# Patient Record
Sex: Female | Born: 1940 | Race: White | Hispanic: No | Marital: Married | Smoking: Never smoker
Health system: Southern US, Community
[De-identification: ages and names within clinical notes are randomized; demographics above are authoritative.]

## PROBLEM LIST (undated history)

## (undated) DIAGNOSIS — K219 Gastro-esophageal reflux disease without esophagitis: Secondary | ICD-10-CM

## (undated) DIAGNOSIS — D510 Vitamin B12 deficiency anemia due to intrinsic factor deficiency: Secondary | ICD-10-CM

## (undated) DIAGNOSIS — J449 Chronic obstructive pulmonary disease, unspecified: Secondary | ICD-10-CM

## (undated) DIAGNOSIS — J471 Bronchiectasis with (acute) exacerbation: Secondary | ICD-10-CM

## (undated) DIAGNOSIS — R627 Adult failure to thrive: Secondary | ICD-10-CM

## (undated) DIAGNOSIS — A310 Pulmonary mycobacterial infection: Secondary | ICD-10-CM

## (undated) DIAGNOSIS — J45909 Unspecified asthma, uncomplicated: Secondary | ICD-10-CM

## (undated) DIAGNOSIS — I10 Essential (primary) hypertension: Secondary | ICD-10-CM

## (undated) HISTORY — DX: Gastro-esophageal reflux disease without esophagitis: K21.9

## (undated) HISTORY — DX: Hypercalcemia: E83.52

## (undated) HISTORY — DX: Adult failure to thrive: R62.7

## (undated) HISTORY — PX: TONSILLECTOMY: SUR1361

## (undated) HISTORY — PX: LUNG BIOPSY: SHX232

## (undated) HISTORY — DX: Vitamin B12 deficiency anemia due to intrinsic factor deficiency: D51.0

## (undated) HISTORY — DX: Bronchiectasis with (acute) exacerbation: J47.1

## (undated) HISTORY — DX: Chronic obstructive pulmonary disease, unspecified: J44.9

## (undated) HISTORY — DX: Pulmonary mycobacterial infection: A31.0

---

## 1998-06-14 ENCOUNTER — Encounter: Payer: Self-pay | Admitting: Internal Medicine

## 1998-06-14 ENCOUNTER — Ambulatory Visit (HOSPITAL_COMMUNITY): Admission: RE | Admit: 1998-06-14 | Discharge: 1998-06-14 | Payer: Self-pay | Admitting: Internal Medicine

## 1998-06-25 ENCOUNTER — Ambulatory Visit (HOSPITAL_COMMUNITY): Admission: RE | Admit: 1998-06-25 | Discharge: 1998-06-25 | Payer: Self-pay | Admitting: Internal Medicine

## 2001-10-31 ENCOUNTER — Inpatient Hospital Stay (HOSPITAL_COMMUNITY): Admission: AD | Admit: 2001-10-31 | Discharge: 2001-11-04 | Payer: Self-pay | Admitting: Internal Medicine

## 2001-11-01 ENCOUNTER — Encounter: Payer: Self-pay | Admitting: Internal Medicine

## 2002-01-21 ENCOUNTER — Encounter: Payer: Self-pay | Admitting: Internal Medicine

## 2002-01-21 ENCOUNTER — Encounter (HOSPITAL_COMMUNITY): Admission: RE | Admit: 2002-01-21 | Discharge: 2002-04-21 | Payer: Self-pay | Admitting: Internal Medicine

## 2002-01-22 ENCOUNTER — Inpatient Hospital Stay (HOSPITAL_COMMUNITY): Admission: EM | Admit: 2002-01-22 | Discharge: 2002-01-24 | Payer: Self-pay | Admitting: Internal Medicine

## 2003-08-22 ENCOUNTER — Inpatient Hospital Stay (HOSPITAL_COMMUNITY): Admission: EM | Admit: 2003-08-22 | Discharge: 2003-08-27 | Payer: Self-pay | Admitting: Emergency Medicine

## 2004-03-05 ENCOUNTER — Inpatient Hospital Stay (HOSPITAL_COMMUNITY): Admission: EM | Admit: 2004-03-05 | Discharge: 2004-03-09 | Payer: Self-pay

## 2004-08-26 ENCOUNTER — Ambulatory Visit: Payer: Self-pay | Admitting: Internal Medicine

## 2004-12-05 ENCOUNTER — Ambulatory Visit: Payer: Self-pay | Admitting: Internal Medicine

## 2005-03-07 ENCOUNTER — Ambulatory Visit: Payer: Self-pay | Admitting: Internal Medicine

## 2005-05-29 ENCOUNTER — Ambulatory Visit: Payer: Self-pay | Admitting: Internal Medicine

## 2005-06-06 ENCOUNTER — Ambulatory Visit: Payer: Self-pay | Admitting: Internal Medicine

## 2005-09-05 ENCOUNTER — Ambulatory Visit: Payer: Self-pay | Admitting: Internal Medicine

## 2005-12-11 ENCOUNTER — Ambulatory Visit: Payer: Self-pay | Admitting: Internal Medicine

## 2006-01-11 ENCOUNTER — Ambulatory Visit: Payer: Self-pay | Admitting: Internal Medicine

## 2006-03-07 ENCOUNTER — Ambulatory Visit: Payer: Self-pay | Admitting: Infectious Diseases

## 2006-03-08 ENCOUNTER — Ambulatory Visit: Payer: Self-pay | Admitting: Internal Medicine

## 2006-05-09 ENCOUNTER — Ambulatory Visit: Payer: Self-pay | Admitting: Infectious Diseases

## 2006-05-30 ENCOUNTER — Ambulatory Visit: Payer: Self-pay | Admitting: Internal Medicine

## 2006-08-08 ENCOUNTER — Ambulatory Visit: Payer: Self-pay | Admitting: Infectious Diseases

## 2006-08-30 ENCOUNTER — Ambulatory Visit: Payer: Self-pay | Admitting: Internal Medicine

## 2006-09-01 DIAGNOSIS — J189 Pneumonia, unspecified organism: Secondary | ICD-10-CM

## 2006-09-01 DIAGNOSIS — K219 Gastro-esophageal reflux disease without esophagitis: Secondary | ICD-10-CM

## 2006-09-01 DIAGNOSIS — J471 Bronchiectasis with (acute) exacerbation: Secondary | ICD-10-CM | POA: Insufficient documentation

## 2006-09-13 ENCOUNTER — Encounter: Payer: Self-pay | Admitting: Infectious Diseases

## 2006-10-09 DIAGNOSIS — J479 Bronchiectasis, uncomplicated: Secondary | ICD-10-CM

## 2006-12-28 ENCOUNTER — Encounter: Payer: Self-pay | Admitting: Infectious Diseases

## 2006-12-28 ENCOUNTER — Ambulatory Visit: Payer: Self-pay | Admitting: Internal Medicine

## 2007-01-02 ENCOUNTER — Ambulatory Visit: Payer: Self-pay | Admitting: Infectious Diseases

## 2007-01-02 DIAGNOSIS — A310 Pulmonary mycobacterial infection: Secondary | ICD-10-CM | POA: Insufficient documentation

## 2007-01-11 ENCOUNTER — Telehealth: Payer: Self-pay | Admitting: Infectious Diseases

## 2007-03-26 ENCOUNTER — Telehealth: Payer: Self-pay | Admitting: Infectious Diseases

## 2007-06-27 ENCOUNTER — Ambulatory Visit: Payer: Self-pay | Admitting: Internal Medicine

## 2007-06-27 ENCOUNTER — Encounter: Payer: Self-pay | Admitting: Infectious Diseases

## 2007-07-02 ENCOUNTER — Encounter: Payer: Self-pay | Admitting: Internal Medicine

## 2007-07-06 ENCOUNTER — Encounter: Payer: Self-pay | Admitting: Internal Medicine

## 2007-07-22 ENCOUNTER — Ambulatory Visit: Payer: Self-pay | Admitting: Infectious Diseases

## 2007-10-14 ENCOUNTER — Ambulatory Visit: Payer: Self-pay | Admitting: Internal Medicine

## 2007-11-27 ENCOUNTER — Ambulatory Visit: Payer: Self-pay | Admitting: Infectious Diseases

## 2007-12-25 ENCOUNTER — Ambulatory Visit: Payer: Self-pay | Admitting: Internal Medicine

## 2008-03-25 ENCOUNTER — Ambulatory Visit: Payer: Self-pay | Admitting: Infectious Diseases

## 2008-06-29 ENCOUNTER — Ambulatory Visit: Payer: Self-pay | Admitting: Internal Medicine

## 2008-07-02 ENCOUNTER — Encounter: Payer: Self-pay | Admitting: Internal Medicine

## 2008-12-28 ENCOUNTER — Ambulatory Visit: Payer: Self-pay | Admitting: Internal Medicine

## 2008-12-29 ENCOUNTER — Encounter: Payer: Self-pay | Admitting: Internal Medicine

## 2009-01-12 ENCOUNTER — Encounter: Payer: Self-pay | Admitting: Internal Medicine

## 2009-03-08 ENCOUNTER — Ambulatory Visit: Payer: Self-pay | Admitting: Internal Medicine

## 2009-06-14 ENCOUNTER — Ambulatory Visit: Payer: Self-pay | Admitting: Internal Medicine

## 2009-09-08 ENCOUNTER — Telehealth: Payer: Self-pay | Admitting: Pulmonary Disease

## 2009-09-09 ENCOUNTER — Telehealth (INDEPENDENT_AMBULATORY_CARE_PROVIDER_SITE_OTHER): Payer: Self-pay | Admitting: *Deleted

## 2009-11-18 ENCOUNTER — Telehealth: Payer: Self-pay | Admitting: Internal Medicine

## 2009-12-13 ENCOUNTER — Ambulatory Visit: Payer: Self-pay | Admitting: Internal Medicine

## 2009-12-21 ENCOUNTER — Encounter: Payer: Self-pay | Admitting: Internal Medicine

## 2009-12-27 ENCOUNTER — Telehealth: Payer: Self-pay | Admitting: Internal Medicine

## 2010-01-14 ENCOUNTER — Ambulatory Visit: Payer: Self-pay | Admitting: Internal Medicine

## 2010-01-25 ENCOUNTER — Encounter: Payer: Self-pay | Admitting: Internal Medicine

## 2010-02-01 ENCOUNTER — Telehealth: Payer: Self-pay | Admitting: Internal Medicine

## 2010-02-22 ENCOUNTER — Telehealth (INDEPENDENT_AMBULATORY_CARE_PROVIDER_SITE_OTHER): Payer: Self-pay | Admitting: *Deleted

## 2010-02-22 ENCOUNTER — Encounter: Payer: Self-pay | Admitting: Internal Medicine

## 2010-02-22 ENCOUNTER — Ambulatory Visit: Payer: Self-pay | Admitting: Internal Medicine

## 2010-02-24 LAB — CONVERTED CEMR LAB
BUN: 13 mg/dL (ref 6–23)
Basophils Absolute: 0 10*3/uL (ref 0.0–0.1)
CO2: 31 meq/L (ref 19–32)
Creatinine, Ser: 0.6 mg/dL (ref 0.4–1.2)
GFR calc non Af Amer: 102.77 mL/min (ref >60)
HCT: 38.2 % (ref 36.0–46.0)
Neutro Abs: 4.1 10*3/uL (ref 1.4–7.7)
Prothrombin Time: 11.2 s (ref 9.7–11.8)
RBC: 4.74 M/uL (ref 3.87–5.11)
Sodium: 139 meq/L (ref 135–145)
WBC: 7.3 10*3/uL (ref 4.5–10.5)

## 2010-03-01 ENCOUNTER — Ambulatory Visit: Payer: Self-pay | Admitting: Cardiology

## 2010-03-02 ENCOUNTER — Telehealth (INDEPENDENT_AMBULATORY_CARE_PROVIDER_SITE_OTHER): Payer: Self-pay | Admitting: *Deleted

## 2010-03-29 ENCOUNTER — Ambulatory Visit: Payer: Self-pay | Admitting: Internal Medicine

## 2010-06-13 ENCOUNTER — Ambulatory Visit: Payer: Self-pay | Admitting: Internal Medicine

## 2010-06-15 ENCOUNTER — Encounter: Payer: Self-pay | Admitting: Internal Medicine

## 2010-06-21 ENCOUNTER — Telehealth: Payer: Self-pay | Admitting: Internal Medicine

## 2010-06-21 LAB — CONVERTED CEMR LAB
Basophils Relative: 0.5 % (ref 0.0–3.0)
Eosinophils Absolute: 0.2 10*3/uL (ref 0.0–0.7)
Eosinophils Relative: 2.4 % (ref 0.0–5.0)
HCT: 36.3 % (ref 36.0–46.0)
Hemoglobin: 11.9 g/dL — ABNORMAL LOW (ref 12.0–15.0)
MCHC: 32.9 g/dL (ref 30.0–36.0)
Monocytes Absolute: 0.4 10*3/uL (ref 0.1–1.0)
Monocytes Relative: 6.1 % (ref 3.0–12.0)
Neutrophils Relative %: 69.5 % (ref 43.0–77.0)
Platelets: 345 10*3/uL (ref 150.0–400.0)
RBC: 4.49 M/uL (ref 3.87–5.11)
RDW: 14.4 % (ref 11.5–14.6)
WBC: 6.5 10*3/uL (ref 4.5–10.5)

## 2010-07-29 ENCOUNTER — Ambulatory Visit: Payer: Self-pay | Admitting: Internal Medicine

## 2010-09-22 NOTE — Assessment & Plan Note (Signed)
Summary: 6 MONTHS/ MBW   Primary Provider/Referring Provider:  [name] / J. Hatcher  CC:  6 month follow up visit- Constent cough-creamy to green in color..  History of Present Illness: February 22, 2010- Bronchiectasis, hx M. avium, hemoptysis Starte yesterday evening with hemoptysis again- clots. She had taken 2 BASA the night before to calm cough. Then red blood, stopped until today.. Today coughing clots thumbnail size. Amounts are slowing compared with last night.  2 weeks of pains around right lateral chest, not clearly pleuritic- describes as constant and dull. Not worse than usual dyspnea. Denies headache or epistaxis. Some nausea without vomiting. No fever or chills. Family want her to visit in [place] in 2 weeks- she is guarded about this. CXR- shows AFLs suggestive of intra brochial blood, or cystic bronchiectasis with fluid. There is a concerning RLL density- possibilty of neoplasm.  March 29, 2010- Bronchiectasis, hx M avium, hemoptysis Significiant heme stopped July 9. No blood at all in past week. Staying off aspirin. Stays always somewhat short of breath and coughing with activity.  CBC and Chem were WNL last visit.  CT chest showed extensive bilateral bronchiectasis with some air/fluid levels, but no active consolidation etc. She feels sharp twinges in lateral ribs if she twists or stands long time in kitchen  June 13, 2010-  Bronchiectasis, hx M avium, hemoptysis Nurse-CC: 6 month follow up visit- Constent cough-creamy to green in color. Malaise for at least several weeks. Was having lower GI pressure plus tussive sorenes of abdomenal wall with a lot of cough. Couldn't tolerate Daliresp because of GI upset, but that is gone. Queasy w/o real nausea, vomiting or diarrhea. Cough is productive of creamy to light green, never really  green. No definite fever or chills. Cough may improve a little on antibiotics, never gone completely.       Preventive Screening-Counseling &  Management  Alcohol-Tobacco     Smoking Status: never  Current Medications (verified): 1)  Nexium 40 Mg Cpdr (Esomeprazole Magnesium) .... Take One Tablet By Mouth Daily. 2)  Hydrochlorothiazide 12.5 Mg Caps (Hydrochlorothiazide) .... Take One Tablet By [facility] Daily. 3)  Flutter For Pulmonary Toilet. .... Use 3 Times, At Least Twice Every Day.  Allergies (verified): 1)  ! 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 COPD (ICD-496) BRONCHIECTASIS (ICD-494.0)            -   recurrent hemoptysis GASTROENTERITIS (ICD-558.9) PNEUMONIA (ICD-486) HYPERCALCEMIA (ICD-275.42) GERD (ICD-530.81)  Past Surgical History: Last updated: 12/28/2008 Tonsillectomy  Family History: Last updated: 12/28/2008 Family History Hypertension Family History of Stroke F 1st degree relative <60 Mother died alzheimers Father died CVA  Social History: Last updated: 06/29/2008 Never Smoked Alcohol use-no married  Risk Factors: Smoking Status: never (06/13/2010)  Review of Systems      See HPI       The patient complains of shortness of breath with activity, productive cough, and change in color of mucus.  The patient denies shortness of breath at rest, non-productive cough, coughing up blood, chest pain, irregular heartbeats, acid heartburn, indigestion, loss of appetite, weight change, abdominal pain, difficulty swallowing, sore throat, tooth/dental problems, headaches, nasal congestion/difficulty breathing through nose, sneezing, itching, hand/feet swelling, joint stiffness or pain, rash, and fever.    Vital Signs:  Patient profile:   70 year old female Height:      67 inches Weight:      142.25 pounds  BMI:     22.36 O2 Sat:      93 % on Room air Pulse rate:   119 / minute BP sitting:   122 / 76  (left arm) Cuff size:   regular  Vitals Entered By: Reynaldo Minium CMA (June 13, 2010 9:00  AM)  O2 Flow:  Room air CC: 6 month follow up visit- Constent cough-creamy to green in color.   Physical Exam  Additional Exam:  General: A/Ox3; pleasant and cooperative, NAD, SKIN: no rash, lesions NODES: no lymphadenopathy HEENT: [place]/AT, EOM- WNL, Conjuctivae- clear, PERRLA, TM-WNL, Nose- clear, Throat- clear and wnl, Mallampatii II NECK: Supple w/ fair ROM, JVD- none, normal carotid impulses w/o bruits Thyroid-  CHEST:. Coarse crackles bilaterally, conversational w/o cough during conversation.  HEART: RRR, no m/g/r heard ABDOMEN:-soft JYN:WGNF, nl pulses, no edema  NEURO: Grossly intact to observation      Impression & Recommendations:  Problem # 1:  BRONCHIECTASIS (ICD-494.0) We will reculture sputum broadly and see if she is still growing mainly pseudomonas.  We discussed probiotics.  She used to reduce postnasal drip with sudafed and we discussed managing for comfort with sudafed or phenylephrine. She blames postnasal drip for some of her queasy stomach.   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 We had Dr Ninetta Lights see her for this before.   Orders: Est. Patient Level IV (62130) TLB-CBC Platelet - w/Differential (85025-CBCD)  Problem # 3:  GERD (ICD-530.81)  Discussed use of nexium on a chronic basis. Her updated medication list for this problem includes:    Nexium 40 Mg Cpdr (Esomeprazole magnesium) .Marland Kitchen... Take one tablet by mouth daily.  Orders: Est. Patient Level IV (86578)  Other Orders: T-Culture, Sputum & Gram Stain (87070/[id number]) T-Culture & Smear AFB (87206/[id number]) T-Culture & Smear Fungus (87206/87102-70320)  Patient Instructions: 1)  Please schedule a follow-up appointment in 1 month. 2)  Lab 3)  Try a simple probiotic, perhabs Florestor from the drug store, or Activia yogurt. 4)  you can try an antihistamine like claritin/ loratadine, or a decongestant like Sudafed  (sign at the counter) , or phenylephrine  5)  sold otc as Sudafed-PE and others. Maybe once daily for awhile you could tell if it reduces drainage without raising your BP.

## 2010-09-22 NOTE — Progress Notes (Signed)
Summary: coughing up blood  Phone Note Call from Patient Call back at [phone] or [phone]   Caller: Patient Call For: young Reason for Call: Talk to Nurse Complaint: Cough/Sore throat Summary of Call: pt coughing up blood last night, bright red, several times last night.  Wants to be seen today by CDY - pt is out of town, afternoon appt best. Initial call taken by: Eugene Gavia,  February 22, 2010 8:10 AM  Follow-up for Phone Call        Please put pt in 430 slot and have her be here at 400pm for CXR before visit-use DX of hemoptysis, COPD, and Bronchiectasis. Thanks.Reynaldo Minium CMA  February 22, 2010 8:59 AM   called spoke with patient, advised of CDY's recs of appt today and cxr first.  pt to arrive at 345 for cxr.  pt verbalized her understanding.  order for cxr placed in idx. Boone Master CNA/MA  February 22, 2010 9:08 AM

## 2010-09-22 NOTE — Assessment & Plan Note (Signed)
Summary: rov 6 months///kp   Primary Provider/Referring Provider:  [name] / J. Hatcher  CC:  6 month follow up visit-lost Flutter and requests a RX for new one.Stacy Novak  History of Present Illness: CXR November- gradual progression of bronchiectasis. Routine sputum then- Nl [place].  03/08/09- Bronciectasis, hx M. avium Realizes chronic cough remains productive and has gradually become an all day issue over the past year and a half. Daily phlegm thick mucus. Denies fever, night sweats. Always some dyspnea with exertion changing slowly if at all. No  blood. Occasional tussive chest wall pains. Cxr had shown progressive bronchiectsasis. Sputum grew braodly sens Pseudomonas, neg x 2 for AFB. Modest benefit from cipro. Discussed cost effectiveness of TOBI neb.  June 14, 2009- Bronchiectasis, hx M.avium She feels well and has had flu vax. She asks about DPT and would like to get it today. last tetanus was 15 yrs ago. Has had pneumovax twice. Maybe a little more breathless in last week with exertion, but no change in her chronic cough productive clear mucus. Denies fever or chest pain, glands, sinus trouble or GI distress.  December 13, 2009- Brocnchiectasis, hx M. avium. She sleeps propped up to breathe more comfortably. Cough has gradually begun to affect her earlier in the day, but is otherwise unchanged. She lost her Flutter and asks replacement. Cough usually prductive mucus, sometimes triggers some reflux. She remains on Nexium. Denies fever or sweats. Sometimes chest aches some but not anginal or pleuritic pain.   Current Medications (verified): 1)  Nexium 40 Mg Cpdr (Esomeprazole Magnesium) .... Take One Tablet By Mouth Daily. 2)  Hydrochlorothiazide 12.5 Mg Caps (Hydrochlorothiazide) .... Take One Tablet By [facility] Daily. 3)  Baby Aspirin 81 Mg  Chew (Aspirin) .... As Directed 4)  Flutter For Pulmonary Toilet. .... Use 3 Times, At Least Twice Every Day.  Allergies (verified): 1)  ! 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 COPD (ICD-496) BRONCHIECTASIS (ICD-494.0) GASTROENTERITIS (ICD-558.9) PNEUMONIA (ICD-486) HYPERCALCEMIA (ICD-275.42) GERD (ICD-530.81)  Past Surgical History: Last updated: 12/28/2008 Tonsillectomy  Family History: Last updated: 12/28/2008 Family History Hypertension Family History of Stroke F 1st degree relative <60 Mother died alzheimers Father died CVA  Social History: Last updated: 06/29/2008 Never Smoked Alcohol use-no married  Risk Factors: Smoking Status: never (09/01/2006)  Review of Systems      See HPI       The patient complains of dyspnea on exertion and prolonged cough.  The patient denies anorexia, fever, weight loss, weight gain, vision loss, decreased hearing, hoarseness, chest pain, syncope, peripheral edema, headaches, hemoptysis, abdominal pain, and severe indigestion/heartburn.    Vital Signs:  Patient profile:   70 year old female Height:      67 inches Weight:      146.13 pounds BMI:     22.97 O2 Sat:      95 % on Room air Pulse rate:   116 / minute BP sitting:   112 / 74  (left arm) Cuff size:   regular  Vitals Entered By: Reynaldo Minium CMA (December 13, 2009 9:20 AM)  O2 Flow:  Room air  Physical Exam  Additional Exam:  General: A/Ox3; pleasant and cooperative, NAD, SKIN: no rash, lesions NODES: no lymphadenopathy HEENT: [place]/AT, EOM- WNL, Conjuctivae- clear, PERRLA, TM-WNL, Nose- clear, Throat- clear and wnl, Mallampatii II NECK: Supple w/ fair ROM, JVD- none, normal carotid impulses w/o bruits Thyroid-  CHEST: dry crackles/ rhonchi  apices and right upper lung zone,  unlabored HEART: RRR, no m/g/r heard ABDOMEN:-soft ZOX:WRUE, nl pulses, no edema  NEURO: Grossly intact to observation      Impression & Recommendations:  Problem # 1:  BRONCHIECTASIS (ICD-494.0) Considerable scarring from bronchiectasis  with fibrosis. Active infection now is not apparent. We will update CXR and encouage walking for endurance.  Problem # 2:  GERD (ICD-530.81)  We discussed management and refilled Nexium. Her updated medication list for this problem includes:    Nexium 40 Mg Cpdr (Esomeprazole magnesium) .Stacy Novak... Take one tablet by mouth daily.  Other Orders: Est. Patient Level III (45409) T-2 View CXR (71020TC) Prescription Created Electronically [phone])  Patient Instructions: 1)  Please schedule a follow-up appointment in 6 months. 2)  Script for Nexium sent to drug store 3)  Script for Flutter printed 4)  A chest x-ray has been recommended.  Your imaging study may require preauthorization.  Prescriptions: FLUTTER FOR PULMONARY TOILET. Use 3 times, at least twice every day.  #1 x 0   Entered and Authorized by:   Waymon Budge MD   Signed by:   Waymon Budge MD on 12/13/2009   Method used:   Print then Give to Patient   RxID:   [id number] NEXIUM 40 MG CPDR (ESOMEPRAZOLE MAGNESIUM) take one tablet by mouth daily.  #30 Capsule x prn   Entered and Authorized by:   Waymon Budge MD   Signed by:   Waymon Budge MD on 12/13/2009   Method used:   Electronically to        [facility]. [phone]* (retail)       [address]       [place], [place]  [address]       Ph: [phone]       Fax: [phone]   RxID:   [id number]

## 2010-09-22 NOTE — Progress Notes (Signed)
Summary: still sick  Phone Note Call from Patient Call back at [phone]   Caller: Patient Call For: young Reason for Call: Talk to Nurse Complaint: Nausea/Vomiting/Diarrhea Summary of Call: pt still not feeling well from yesterday, nauseated, diarrhea, stomach cramps, head congestion Initial call taken by: Eugene Gavia,  September 09, 2009 8:16 AM  Follow-up for Phone Call        The patient still c/o headache, cough with creamy mucus, nausea and stomach cramps. She says the diarrhea has gotten better. She is taking Tylenol only for any fever or pain. She says this started Tuesday, 09/07/09. She says she is not able to come for any OV and has not been in touch with her PCP. She wants suppositories for the nausea, if CDY is willing to RX and any other recs. The patient denies any incr sob or wheezing with the head congestion. Please advise.  ALLERGIES:  SULFA Follow-up by: Michel Bickers CMA,  September 09, 2009 8:51 AM  Additional Follow-up for Phone Call Additional follow up Details #1::        Offer phenergan suppository, 25 mg, # 6, 1 three times a day as needed. See if this is enough. Additional Follow-up by: Waymon Budge MD,  September 09, 2009 11:39 AM    Additional Follow-up for Phone Call Additional follow up Details #2::    The pt is aware and wants RX sent to [facility] [place]. She will call if her sxs do not improve or if they get worse.Michel Bickers CMA  September 09, 2009 12:00 PM  New/Updated Medications: PROMETHAZINE HCL 25 MG SUPP (PROMETHAZINE HCL) insert 1 supp as directed three times a day as needed Prescriptions: PROMETHAZINE HCL 25 MG SUPP (PROMETHAZINE HCL) insert 1 supp as directed three times a day as needed  #6 x 0   Entered by:   Michel Bickers CMA   Authorized by:   Waymon Budge MD   Signed by:   Michel Bickers CMA on 09/09/2009   Method used:   Electronically to        [facility]. [phone]* (retail)       [address]       [place], [place]  [address]       Ph: [phone]       Fax: [phone]   RxID:   [id number]

## 2010-09-22 NOTE — Progress Notes (Signed)
Summary: speak to nurse  Phone Note Call from Patient Call back at [phone]   Caller: Patient Call For: young Summary of Call: Still coughing up blood x 1 wk wants to know if she should be concerned, pls advise. Also want results of cxr, can also be reached at [phone] and [phone]. Initial call taken by: Darletta Moll,  March 02, 2010 9:36 AM  Follow-up for Phone Call        called and spoke with pt and she stated that she is still coughing up the blood--sometimes is bright red and this gets worse as the day goes on some days.  it was worse when she did the ct scan and had to lay flat.   should she extend taking the levaquin for a few more days??  please advise.  thanks Randell Loop CMA  March 02, 2010 9:55 AM   Additional Follow-up for Phone Call Additional follow up Details #1::        Pt informed that [name] recommends another 7 days of Levaquin .  Rx was sent to [facility] [address]. Abigail Miyamoto RN  March 02, 2010 10:56 AM     Prescriptions: LEVAQUIN 500 MG TABS (LEVOFLOXACIN) 1 daily after food  #7 x 0   Entered by:   Abigail Miyamoto RN   Authorized by:   Waymon Budge MD   Signed by:   Abigail Miyamoto RN on 03/02/2010   Method used:   Electronically to        [facility]. [phone]* (retail)       [address]       [place], [place]  [address]       Ph: [phone]       Fax: [phone]   RxID:   [id number]

## 2010-09-22 NOTE — Assessment & Plan Note (Signed)
Summary: 1 month return/mhh   Primary Provider/Referring Provider:  [name] / J. Hatcher  CC:  1 month folow up visit-cough early in morning causing SOB with activity. cough is productive-yellow green in color..  History of Present Illness: March 29, 2010- Bronchiectasis, hx M avium, hemoptysis Significiant heme stopped July 9. No blood at all in past week. Staying off aspirin. Stays always somewhat short of breath and coughing with activity.  CBC and Chem were WNL last visit.  CT chest showed extensive bilateral bronchiectasis with some air/fluid levels, but no active consolidation etc. She feels sharp twinges in lateral ribs if she twists or stands long time in kitchen  June 13, 2010-  Bronchiectasis, hx M avium, hemoptysis Nurse-CC: 6 month follow up visit- Constent cough-creamy to green in color. Malaise for at least several weeks. Was having lower GI pressure plus tussive sorenes of abdomenal wall with a lot of cough. Couldn't tolerate Daliresp because of GI upset, but that is gone. Queasy w/o real nausea, vomiting or diarrhea. Cough is productive of creamy to light green, never really  green. No definite fever or chills. Cough may improve a little on antibiotics, never gone completely.   July 29, 2010-   Bronchiectasis, hx M avium, hemoptysis Nurse-CC: 1 month folow up visit-cough early in morning causing SOB with activity. cough is productive-yellow green in color. Sputum cultures from October are all normal flora/ negative.  Last antibiotic was Levaquin in August.  Coughs all day long, usually productive yellowish green, no bloody. Denies fever, sweat. Chronic DOE- avoids long walks. Uses [facility] parking. Weight stable  Had flu vax. Flutter may help- not used much.       Preventive Screening-Counseling & Management  Alcohol-Tobacco     Smoking Status: never  Current Medications (verified): 1)  Nexium 40 Mg Cpdr (Esomeprazole Magnesium) .... Take One Tablet By Mouth Daily. 2)   Hydrochlorothiazide 12.5 Mg Caps (Hydrochlorothiazide) .... Take One Tablet By [facility] Daily. 3)  Flutter For Pulmonary Toilet. .... Use 3 Times, At Least Twice Every Day.  Allergies (verified): 1)  ! 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 COPD (ICD-496) BRONCHIECTASIS (ICD-494.0)            -   recurrent hemoptysis GASTROENTERITIS (ICD-558.9) PNEUMONIA (ICD-486) HYPERCALCEMIA (ICD-275.42) GERD (ICD-530.81)  Past Surgical History: Last updated: 12/28/2008 Tonsillectomy  Family History: Last updated: 12/28/2008 Family History Hypertension Family History of Stroke F 1st degree relative <60 Mother died alzheimers Father died CVA  Social History: Last updated: 06/29/2008 Never Smoked Alcohol use-no married  Risk Factors: Smoking Status: never (07/29/2010)  Review of Systems      See HPI       The patient complains of shortness of breath with activity and productive cough.  The patient denies shortness of breath at rest, non-productive cough, coughing up blood, chest pain, irregular heartbeats, acid heartburn, indigestion, loss of appetite, weight change, abdominal pain, difficulty swallowing, sore throat, tooth/dental problems, headaches, nasal congestion/difficulty breathing through nose, sneezing, itching, anxiety, change in color of mucus, and fever.    Vital Signs:  Patient profile:   70 year old female Height:      67 inches Weight:      144.25 pounds BMI:     22.67 O2 Sat:      94 % on Room air Pulse rate:   104 / minute BP sitting:   140 / 60  (left  arm) Cuff size:   regular  Vitals Entered By: Reynaldo Minium CMA (July 29, 2010 9:21 AM)  O2 Flow:  Room air CC: 1 month folow up visit-cough early in morning causing SOB with activity. cough is productive-yellow green in color.   Physical Exam  Additional Exam:  General: A/Ox3; pleasant and cooperative,  NAD, SKIN: no rash, lesions NODES: no lymphadenopathy HEENT: [place]/AT, EOM- WNL, Conjuctivae- clear, PERRLA, TM-WNL, Nose- clear, Throat- clear and wnl, Mallampatii II NECK: Supple w/ fair ROM, JVD- none, normal carotid impulses w/o bruits Thyroid-  CHEST:. Coarse crackles bilaterally, worst in RUL, conversational w/o cough during conversation.  HEART: RRR, no m/g/r heard ABDOMEN:-soft WGN:FAOZ, nl pulses, no edema  NEURO: Grossly intact to observation      Impression & Recommendations:  Problem # 1:  BRONCHIECTASIS (ICD-494.0) This is a chronic and slowly progressive process. Since last cultures were nonspecific, I will give her an empiric script for augmentin. We will let her try small scripts for benzonatate and tramadol as cough suppressants to see if either is helpful.  We may need to assess oxygenation with exertion and sleep in the next year.   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   Medications Added to Medication List This Visit: 1)  Amoxicillin-pot Clavulanate 500-125 Mg Tabs (Amoxicillin-pot clavulanate) .Marland Kitchen.. 1 two times a day 2)  Benzonatate 100 Mg Caps (Benzonatate) .Marland Kitchen.. 1-2 three times a day as needed cough 3)  Tramadol Hcl 50 Mg Tabs (Tramadol hcl) .Marland Kitchen.. 1 three times a day as needed cough  Other Orders: Est. Patient Level IV (30865)  Patient Instructions: 1)  Please schedule a follow-up appointment in 4 months. 2)  Script for antibiotic augmentin/ amoxacillin clavulinate 3)  Script for cough benzonatate perles 4)  Script for cough tramadol 5)  You can take any of these meds after meals,  but they may kick in faster before food. Prescriptions: TRAMADOL HCL 50 MG TABS (TRAMADOL HCL) 1 three times a day as needed cough  #25 x 0   Entered and Authorized by:   Waymon Budge  MD   Signed by:   Waymon Budge MD on 07/29/2010   Method used:   Print then Give to Patient   RxID:   [id number] BENZONATATE 100 MG CAPS (BENZONATATE) 1-2 three times a day as needed cough  #25 x 5   Entered and Authorized by:   Waymon Budge MD   Signed by:   Waymon Budge MD on 07/29/2010   Method used:   Print then Give to Patient   RxID:   [id number] AMOXICILLIN-POT CLAVULANATE 500-125 MG TABS (AMOXICILLIN-POT CLAVULANATE) 1 two times a day  #14 x 1   Entered and Authorized by:   Waymon Budge MD   Signed by:   Waymon Budge MD on 07/29/2010   Method used:   Print then Give to Patient   RxID:   [id number]

## 2010-09-22 NOTE — Assessment & Plan Note (Signed)
Summary: hemoptysis w/ cxr///JJ   Primary Provider/Referring Provider:  [name] / J. Hatcher  CC:  Accute visit-Hemoptsysis-rich red blood last night and still into today; SOB.Marland Kitchen  History of Present Illness: June 14, 2009- Bronchiectasis, hx M.avium She feels well and has had flu vax. She asks about DPT and would like to get it today. last tetanus was 15 yrs ago. Has had pneumovax twice. Maybe a little more breathless in last week with exertion, but no change in her chronic cough productive clear mucus. Denies fever or chest pain, glands, sinus trouble or GI distress.  December 13, 2009- Brocnchiectasis, hx M. avium. She sleeps propped up to breathe more comfortably. Cough has gradually begun to affect her earlier in the day, but is otherwise unchanged. She lost her Flutter and asks replacement. Cough usually prductive mucus, sometimes triggers some reflux. She remains on Nexium. Denies fever or sweats. Sometimes chest aches some but not anginal or pleuritic pain.  Jan 14, 2010- Bronchiectasis, Hx M. avium, hemoptysis M. avium Rx'd 2002. 5 days ago, OOT, thought she might have some fever and felt raw in chest. No change in her chronic productive cough, until 3 days ago when she began coughing up red blood in clots mixed in mucus. Heme off and on since then. Now wheeze and cough til retch at times, sometimes with no blood. Denies nodes, chest pain, dyspnea, nodes. CXR- 11/2509- air/fluid levels, severe scarring.  February 22, 2010- Bronchiectasis, hx M. avium, hemoptysis Starte yesterday evening with hemoptysis again- clots. She had taken 2 BASA the night before to calm cough. Then red blood, stopped until today.. Today coughing clots thumbnail size. Amounts are slowing compared with last night.  2 weeks of pains around right lateral chest, not clearly pleuritic- describes as constant and dull. Not worse than usual dyspnea. Denies headache or epistaxis. Some nausea without vomiting. No fever or chills.  Family want her to visit in [place] in 2 weeks- she is guarded about this. CXR- shows AFLs suggestive of intra brochial blood, or cystic bronchiectasis with fluid. There is a concerning RLL density- possibilty of neoplasm.     Preventive Screening-Counseling & Management  Alcohol-Tobacco     Smoking Status: never  Current Medications (verified): 1)  Nexium 40 Mg Cpdr (Esomeprazole Magnesium) .... Take One Tablet By Mouth Daily. 2)  Hydrochlorothiazide 12.5 Mg Caps (Hydrochlorothiazide) .... Take One Tablet By [facility] Daily. 3)  Baby Aspirin 81 Mg  Chew (Aspirin) .... As Directed 4)  Flutter For Pulmonary Toilet. .... Use 3 Times, At Least Twice Every Day. 5)  Hydrocodone-Homatropine 5-1.5 Mg/59ml Syrp (Hydrocodone-Homatropine) .Marland Kitchen.. 1 Teaspoon Three Times A Day As Needed Cough  Allergies (verified): 1)  ! 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 COPD (ICD-496) BRONCHIECTASIS (ICD-494.0)            -   recurrent hemoptysis GASTROENTERITIS (ICD-558.9) PNEUMONIA (ICD-486) HYPERCALCEMIA (ICD-275.42) GERD (ICD-530.81)  Review of Systems      See HPI       The patient complains of productive cough, coughing up blood, chest pain, and change in color of mucus.  The patient denies shortness of breath with activity,  shortness of breath at rest, non-productive cough, irregular heartbeats, acid heartburn, indigestion, loss of appetite, weight change, abdominal pain, difficulty swallowing, sore throat, tooth/dental problems, headaches, sneezing, itching, ear ache,  and fever.    Vital Signs:  Patient profile:   70 year old female Height:      67 inches Weight:      141 pounds BMI:     22.16 O2 Sat:      90 % on Room air Pulse rate:   140 / minute BP sitting:   130 / 76  (left arm) Cuff size:   regular  Vitals Entered By: Reynaldo Minium CMA (February 22, 2010 4:52 PM)  O2 Flow:  Room air CC: Accute visit-Hemoptsysis-rich red blood last night and still into today; SOB.   Physical Exam  Additional Exam:  General: A/Ox3; pleasant and cooperative, NAD, SKIN: no rash, lesions NODES: no lymphadenopathy HEENT: [place]/AT, EOM- WNL, Conjuctivae- clear, PERRLA, TM-WNL, Nose- clear, Throat- clear and wnl, Mallampatii II NECK: Supple w/ fair ROM, JVD- none, normal carotid impulses w/o bruits Thyroid-  CHEST:harsh cough here- dry, wheezy, no blood, coarse crackles bilaterally HEART: RRR, no m/g/r heard ABDOMEN:-soft OVF:IEPP, nl pulses, no edema  NEURO: Grossly intact to observation      Impression & Recommendations:  Problem # 1:  BRONCHIECTASIS (ICD-494.0) Exacerbation with hemoptysis. We will have her take Levaquin now, with something on her stomach, stay off aspirin for the hemoptysis, and schedule CT. She has cough syrup, but is sparing with it due to sensitive stomach.  Problem # 2:  HEMOPTYSIS UNSPECIFIED (ICD-786.30)  This is almost certainly from her brochieictasis, but with the density seen in her lung, we will need the CT for this issue as well. The following medications were removed from the medication list:    Amoxicillin-pot Clavulanate 500-125 Mg Tabs (Amoxicillin-pot clavulanate) .Marland Kitchen... 1 twice daily Her updated medication list for this problem includes:    Levaquin 500 Mg Tabs (Levofloxacin) .Marland Kitchen... 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 watching need to bronchoscope for deep cultures.  Medications Added to Medication List  This Visit: 1)  Levaquin 500 Mg Tabs (Levofloxacin) .Marland Kitchen.. 1 daily after food 2)  Promethazine Hcl 25 Mg Supp (Promethazine hcl) .Marland Kitchen.. 1 two times a day if needed  Other Orders: Est. Patient Level IV (29518) Radiology Referral (Radiology) TLB-BMP (Basic Metabolic Panel-BMET) (80048-METABOL) TLB-CBC Platelet - w/Differential (85025-CBCD) TLB-PT (Protime) (85610-PTP)  Patient Instructions: 1)  Please schedule a follow-up appointment in 1 month. 2)  A Chest CT with Contrast has been recommended.  Your imaging study may require preauthorization.  3)  Lab ( nonfasting ) 4)  scripts for levaquin antibiotic, and for phenergan suppositories sent to your drug store Prescriptions: PROMETHAZINE HCL 25 MG SUPP (PROMETHAZINE HCL) 1 two times a day if needed  #5 x 0   Entered and Authorized by:   Waymon Budge MD   Signed by:   Waymon Budge MD on 02/22/2010   Method used:   Electronically to        [facility]. [phone]* (retail)       [address]       [place], [place]  [address]       Ph: [phone]       Fax: [phone]   RxID:   [id number] [facility]  500 MG TABS (LEVOFLOXACIN) 1 daily after food  #10 x 0   Entered and Authorized by:   Waymon Budge MD   Signed by:   Waymon Budge MD on 02/22/2010   Method used:   Electronically to        [facility]. [phone]* (retail)       [address]       [place], [place]  [address]       Ph: [phone]       Fax: [phone]   RxID:   [id number]

## 2010-09-22 NOTE — Progress Notes (Signed)
Summary: rx  Phone Note Call from Patient Call back at [phone]   Caller: Patient Call For: young Reason for Call: Talk to Nurse Summary of Call: fever for a couple of days, nauseated.  Would like sepositories, and is it ok to take 500mg  Tylenol(extra). [facility] Initial call taken by: Eugene Gavia,  September 08, 2009 3:18 PM  Follow-up for Phone Call        PT c/o headache, productive cough with white phlegm, nauseated, and fever. Pt only request is if she can have some suppositories to help with the nausea. Pt also asked if she could take tylenol for fever. I advised that would be fine, to take as directed per the bottle. Please advise. Carron Curie CMA  September 08, 2009 4:12 PM   Additional Follow-up for Phone Call Additional follow up Details #1::        I'm ok with tylenol. would rather not give her suppository.  Can call in phenergan 12.5mg  tabs   one every 6 hrs if needed for nausea.  #6, no fills....she needs to call tomorrow and get apptm to see Dr. Maple Hudson (or is her primary md taking care of this?) Additional Follow-up by: Barbaraann Share MD,  September 08, 2009 5:16 PM    Additional Follow-up for Phone Call Additional follow up Details #2::    pt advised of kc recs. Pt does not want to take tablets, says she has trouble keeping them down. She said she would just wait and see if she feels better int eh next day or so, and will call back if no better. Carron Curie CMA  September 08, 2009 5:20 PM

## 2010-09-22 NOTE — Assessment & Plan Note (Signed)
Summary: coughing up blood/jd   Primary Provider/Referring Provider:  Ricki Novak / Stacy Novak  CC:  Coughing up blood since Tuesday "cherry color". slight SOB and wheezing.Marland Kitchen  History of Present Illness: CXR November- gradual progression of bronchiectasis. Routine sputum then- Nl [place].  03/08/09- Bronciectasis, hx M. avium Realizes chronic cough remains productive and has gradually become an all day issue over the past year and a half. Daily phlegm thick mucus. Denies fever, night sweats. Always some dyspnea with exertion changing slowly if at all. No  blood. Occasional tussive chest wall pains. Cxr had shown progressive bronchiectsasis. Sputum grew braodly sens Pseudomonas, neg x 2 for AFB. Modest benefit from cipro. Discussed cost effectiveness of TOBI neb.  June 14, 2009- Bronchiectasis, hx M.avium She feels well and has had flu vax. She asks about DPT and would like to get it today. last tetanus was 15 yrs ago. Has had pneumovax twice. Maybe a little more breathless in last week with exertion, but no change in her chronic cough productive clear mucus. Denies fever or chest pain, glands, sinus trouble or GI distress.  December 13, 2009- Brocnchiectasis, hx M. avium. She sleeps propped up to breathe more comfortably. Cough has gradually begun to affect her earlier in the day, but is otherwise unchanged. She lost her Flutter and asks replacement. Cough usually prductive mucus, sometimes triggers some reflux. She remains on Nexium. Denies fever or sweats. Sometimes chest aches some but not anginal or pleuritic pain.  Jan 14, 2010- Bronchiectasis, Hx M. avium, hemoptysis M. avium Rx'd 2002. 5 days ago, OOT, thought she might have some fever and felt raw in chest. No change in her chronic productive cough, until 3 days ago when she began coughing up red blood in clots mixed in mucus. Heme off and on since then. Now wheeze and cough til retch at times, sometimes with no blood. Denies nodes, chest  pain, dyspnea, nodes. CXR- 11/2509- air/fluid levels, severe scarring.  Preventive Screening-Counseling & Management  Alcohol-Tobacco     Smoking Status: never  Current Medications (verified): 1)  Nexium 40 Mg Cpdr (Esomeprazole Magnesium) .... Take One Tablet By Mouth Daily. 2)  Hydrochlorothiazide 12.5 Mg Caps (Hydrochlorothiazide) .... Take One Tablet By [facility] Daily. 3)  Baby Aspirin 81 Mg  Chew (Aspirin) .... As Directed 4)  Flutter For Pulmonary Toilet. .... Use 3 Times, At Least Twice Every Day.  Allergies (verified): 1)  ! 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 COPD (ICD-496) BRONCHIECTASIS (ICD-494.0) GASTROENTERITIS (ICD-558.9) PNEUMONIA (ICD-486) HYPERCALCEMIA (ICD-275.42) GERD (ICD-530.81)  Past Surgical History: Last updated: 12/28/2008 Tonsillectomy  Family History: Last updated: 12/28/2008 Family History Hypertension Family History of Stroke F 1st degree relative <60 Mother died alzheimers Father died CVA  Social History: Last updated: 06/29/2008 Never Smoked Alcohol use-no married  Risk Factors: Smoking Status: never (01/14/2010)  Review of Systems      See HPI       The patient complains of shortness of breath with activity, productive cough, and coughing up blood.  The patient denies shortness of breath at rest, non-productive cough, chest pain, irregular heartbeats, acid heartburn, indigestion, loss of appetite, weight change, abdominal pain, difficulty swallowing, sore throat, tooth/dental problems, headaches, nasal congestion/difficulty breathing through nose, and sneezing.    Vital Signs:  Patient profile:   70 year old female Height:      67 inches Weight:      144 pounds BMI:  22.64 O2 Sat:      91 % on Room air Temp:     98.9 degrees F oral Pulse rate:   130 / minute BP sitting:   130 / 84  (left arm) Cuff size:    regular  Vitals Entered By: Reynaldo Minium CMA (Jan 14, 2010 4:25 PM)  O2 Flow:  Room air  Physical Exam  Additional Exam:  General: A/Ox3; pleasant and cooperative, NAD, SKIN: no rash, lesions NODES: no lymphadenopathy HEENT: [place]/AT, EOM- WNL, Conjuctivae- clear, PERRLA, TM-WNL, Nose- clear, Throat- clear and wnl, Mallampatii II NECK: Supple w/ fair ROM, JVD- none, normal carotid impulses w/o bruits Thyroid-  CHEST:harsh cough here- dry, no blood, coarse crackles bilaterally HEART: RRR, no m/g/r heard ABDOMEN:-soft AVW:UJWJ, nl pulses, no edema  NEURO: Grossly intact to observation      CXR  Procedure date:  12/13/2009  Findings:      DG CHEST 2 VIEW - [id number]   Clinical Data: COPD, bronchiectasis.   CHEST - 2 VIEW   Comparison: 12/28/2008   Findings: Severe bronchiectasis/fibrosis changes throughout the lungs.  I suspect there are scattered air-fluid levels within several of the dilated airways.  This is similar to prior study. Underlying hyperinflation.  Heart is normal size.  No effusions.   IMPRESSION: Stable severe bronchiectasis/fibrosis changes.  Suspect scattered air-fluid levels within the dilated airways.   Read By:  Charlett Nose,  M.D.     Released By:  Charlett Nose,  M.D.  _________  Impression & Recommendations:  Problem # 1:  BRONCHIECTASIS (ICD-494.0) Bronchiectasis with hemoptysis which is new since she had it just before we treated for M avium around 2002. This should be treated as an aciute exacerbation of her bronchiectasis, using cough suppressant, antibiotic. CXR. Stay off baby aspirin.   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  Medications Added to Medication List This Visit: 1)  Amoxicillin-pot Clavulanate 500-125 Mg Tabs (Amoxicillin-pot clavulanate) .Marland Kitchen.. 1 twice daily 2)  Hydrocodone-homatropine 5-1.5 Mg/46ml Syrp  (Hydrocodone-homatropine) .Marland Kitchen.. 1 teaspoon three times a day as needed cough  Other Orders: Est. Patient Level IV (99214) T-2 View CXR (71020TC)  Patient Instructions: 1)  Keep appointment- call early as needed 2)  Script printed for cough syrup 3)  script sent for antibiotic 4)  A chest x-ray has been recommended.  Your imaging study may require preauthorization.  5)  .............................................................................. 6)  01/14/10- DG CHEST 2 VIEW - [id number] 7)    8)  Clinical Data: COPD.  Bronchiectasis.  Mycobacterial 9)    10)  CHEST - 2 VIEW 11)    12)  Comparison: 12/13/2009 13)    14)  Findings: Again appreciated are innumerable cystic spaces with air- 15)  fluid levels in the upper lobes.  Coarse pulmonary lung markings 16)  are also noted.  There may be some degree of fibrosis.  Lungs are 17)  hyperaerated consistent with COPD.  The cystic like spaces may be 18)  due to cystic bronchiectasis.  Air-fluid levels may suggest 19)  infection. 20)    21)  IMPRESSION: 22)  No significant change in extensive fibrocystic changes of the lungs 23)  primarily the upper lobes.  Consider cystic bronchiectasis.  Air- 24)  fluid levels within multiple cystic spaces may suggest infection. 25)    26)  Read By:  Jonne Ply,  M.D.     27)  Released By:  Jonne Ply,  M.D. Prescriptions: HYDROCODONE-HOMATROPINE 5-1.5 MG/5ML SYRP (HYDROCODONE-HOMATROPINE) 1 teaspoon three times a day as needed cough  #200 ml x 0   Entered and Authorized by:   Waymon Budge MD   Signed by:   Waymon Budge MD on 01/14/2010   Method used:   Print then Give to Patient   RxID:   [id number] AMOXICILLIN-POT CLAVULANATE 500-125 MG TABS (AMOXICILLIN-POT CLAVULANATE) 1 twice daily  #14 x 1   Entered and Authorized by:   Waymon Budge MD   Signed by:   Waymon Budge MD on 01/14/2010   Method used:   Electronically to        [facility]. [phone]*  (retail)       [address]       [place], [place]  [address]       Ph: [phone]       Fax: [phone]   RxID:   [id number]      CXR  Procedure date:  12/13/2009  Findings:      DG CHEST 2 VIEW - [id number]   Clinical Data: COPD, bronchiectasis.   CHEST - 2 VIEW   Comparison: 12/28/2008   Findings: Severe bronchiectasis/fibrosis changes throughout the lungs.  I suspect there are scattered air-fluid levels within several of the dilated airways.  This is similar to prior study. Underlying hyperinflation.  Heart is normal size.  No effusions.   IMPRESSION: Stable severe bronchiectasis/fibrosis changes.  Suspect scattered air-fluid levels within the dilated airways.   Read By:  Charlett Nose,  M.D.     Released By:  Charlett Nose,  M.D.  _________

## 2010-09-22 NOTE — Progress Notes (Signed)
Summary: results  Phone Note Call from Patient   Caller: Patient Call For: young Summary of Call: pt calling for xray results Initial call taken by: Rickard Patience,  Dec 27, 2009 10:20 AM  Follow-up for Phone Call        pt advised per append. Carron Curie CMA  Dec 27, 2009 10:49 AM

## 2010-09-22 NOTE — Progress Notes (Signed)
Summary: CBC - borerline mild anemia - ? further recs  Phone Note Call from Patient Call back at [phone]   Caller: Patient Call For: Adriane Guglielmo Summary of Call: Returning katie's call. Initial call taken by: Darletta Moll,  June 21, 2010 11:41 AM  Follow-up for Phone Call        Per CBC lab append from CY dated 10.25.11, CBC- borderline mildly anemic. Does she have a history of this?  Looks like Florentina Addison was trying to contact pt regarding this.  Called, spoke with pt.  She was informed of above.  Per pt, she does not know of having a history of this.  Will forward phone note to CY to address.  Dr. Maple Hudson, pls advise if you have any further recs for pt. Thanks!  Follow-up by: Gweneth Dimitri RN,  June 21, 2010 11:48 AM  Additional Follow-up for Phone Call Additional follow up Details #1::        All sputum cultures so far are normal organisms. The cultures for fungus and for TB will not be reported as final for about a month.  Her hemoglobin was just very slightly below the normal range. This can be reported to her primary doctor. Nothing needs to be done from my standpoint until her next visit.  Additional Follow-up by: Waymon Budge MD,  June 21, 2010 1:04 PM    Additional Follow-up for Phone Call Additional follow up Details #2::    pt advised per CY recs.Carron Curie CMA  June 21, 2010 2:54 PM

## 2010-09-22 NOTE — Progress Notes (Signed)
Summary: rx  Phone Note Call from Patient Call back at Home Phone [phone]   Caller: Patient Call For: Stacy Novak Reason for Call: Talk to Nurse Summary of Call: pt going out of town today, needs Nexium refill to take with her.  Order in Animator.  Can this be done this morning?  Request in Katie's box. Initial call taken by: Eugene Gavia,  November 18, 2009 8:26 AM  Follow-up for Phone Call        Pt advised at time of last refill for Nexium to get future refilss through PCP. Pt states she is leaving out of town today and needs this refill done before she leaves. Can we give her one refill to last while she is out of town, and then she was advised to get future refills through PMD.Please advise. Carron Curie CMA  November 18, 2009 9:06 AM   Additional Follow-up for Phone Call Additional follow up Details #1::        es- ok to refill Nexium this time. Additional Follow-up by: Waymon Budge MD,  November 18, 2009 11:44 AM    Additional Follow-up for Phone Call Additional follow up Details #2::    pt advised rx snet, but that she needed to get future refills from PMD. Carron Curie CMA  November 18, 2009 11:49 AM   Prescriptions: NEXIUM 40 MG CPDR (ESOMEPRAZOLE MAGNESIUM) take one tablet by mouth daily.  #30 Capsule x 0   Entered by:   Carron Curie CMA   Authorized by:   Waymon Budge MD   Signed by:   Carron Curie CMA on 11/18/2009   Method used:   Electronically to        [facility]. [phone]* (retail)       [address]       [place], [place]  [address]       Ph: [phone]       Fax: [phone]   RxID:   [id number]

## 2010-09-22 NOTE — Miscellaneous (Signed)
Summary: Orders Update  Clinical Lists Changes  Orders: Added new Test order of T-2 View CXR (71020TC) - Signed 

## 2010-09-22 NOTE — Letter (Signed)
Summary: [facility] Results Follow Up Letter  [facility]  [address]   [place], [place] [address]   Phone: [phone]  Fax: [phone]    01/25/2010 MRN: [id number]  Stacy Novak [address] [place], [place]  [address]  Dear Ms. Madill,  We have received the results from your recent tests and have been unable to contact you.  Please call our office at [phone] so that Dr. Maple Hudson or his nurse may review the results with you.    Thank you,       Nature conservation officer Pulmonary Division

## 2010-09-22 NOTE — Progress Notes (Signed)
Summary: xray results  Phone Note Call from Patient Call back at Home Phone [phone] Call back at [phone]   Caller: Patient Call For: [name] Reason for Call: Lab or Test Results Summary of Call: Pt called for xray results per letter. Initial call taken by: Darletta Moll,  February 01, 2010 4:26 PM  Follow-up for Phone Call        Pt advsied per append. She states she finsihed abx and feels well at this time. Carron Curie CMA  February 01, 2010 4:32 PM

## 2010-09-22 NOTE — Letter (Signed)
Summary: [facility] Results Follow Up Letter  [facility]  [address]   [place], [place] [address]   Phone: [phone]  Fax: [phone]    12/21/2009 MRN: [id number]  AHLEAH SIMKO [address] [place], [place]  [address]  Dear Ms. Thoman,  We have received the results from your recent tests and have been unable to contact you.  Please call our office at [phone] so that Dr.Young or his nurse may review the results with you.    Thank you,      Nature conservation officer Pulmonary Division

## 2010-09-22 NOTE — Assessment & Plan Note (Signed)
Summary: ROV//MBW   Primary Provider/Referring Provider:  [name] / J. Hatcher  CC:  Follow up visit-Bronchiectasis; not coughing up blood anymore x 1 week.Stacy Novak  History of Present Illness: Jan 14, 2010- Bronchiectasis, Hx M. avium, hemoptysis M. avium Rx'd 2002. 5 days ago, OOT, thought she might have some fever and felt raw in chest. No change in her chronic productive cough, until 3 days ago when she began coughing up red blood in clots mixed in mucus. Heme off and on since then. Now wheeze and cough til retch at times, sometimes with no blood. Denies nodes, chest pain, dyspnea, nodes. CXR- 11/2509- air/fluid levels, severe scarring.  February 22, 2010- Bronchiectasis, hx M. avium, hemoptysis Starte yesterday evening with hemoptysis again- clots. She had taken 2 BASA the night before to calm cough. Then red blood, stopped until today.. Today coughing clots thumbnail size. Amounts are slowing compared with last night.  2 weeks of pains around right lateral chest, not clearly pleuritic- describes as constant and dull. Not worse than usual dyspnea. Denies headache or epistaxis. Some nausea without vomiting. No fever or chills. Family want her to visit in [place] in 2 weeks- she is guarded about this. CXR- shows AFLs suggestive of intra brochial blood, or cystic bronchiectasis with fluid. There is a concerning RLL density- possibilty of neoplasm.  March 29, 2010- Bronchiectasis, hx M avium, hemoptysis Significiant heme stopped July 9. No blood at all in past week. Staying off aspirin. Stays always somewhat short of breath and coughing with activity.  CBC and Chem were WNL last visit.  CT chest showed extensive bilateral bronchiectasis with some air/fluid levels, but no active consolidation etc. She feels sharp twinges in lateral ribs if she twists or stands long time in [place]    Preventive Screening-Counseling & Management  Alcohol-Tobacco     Smoking Status: never  Current Medications  (verified): 1)  Nexium 40 Mg Cpdr (Esomeprazole Magnesium) .... Take One Tablet By Mouth Daily. 2)  Hydrochlorothiazide 12.5 Mg Caps (Hydrochlorothiazide) .... Take One Tablet By [facility] Daily. 3)  Flutter For Pulmonary Toilet. .... Use 3 Times, At Least Twice Every Day. 4)  Hydrocodone-Homatropine 5-1.5 Mg/67ml Syrp (Hydrocodone-Homatropine) .Stacy Novak.. 1 Teaspoon Three Times A Day As Needed Cough  Allergies (verified): 1)  ! 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 COPD (ICD-496) BRONCHIECTASIS (ICD-494.0)            -   recurrent hemoptysis GASTROENTERITIS (ICD-558.9) PNEUMONIA (ICD-486) HYPERCALCEMIA (ICD-275.42) GERD (ICD-530.81)  Past Surgical History: Last updated: 12/28/2008 Tonsillectomy  Family History: Last updated: 12/28/2008 Family History Hypertension Family History of Stroke F 1st degree relative <60 Mother died alzheimers Father died CVA  Social History: Last updated: 06/29/2008 Never Smoked Alcohol use-no married  Risk Factors: Smoking Status: never (03/29/2010)  Review of Systems      See HPI       The patient complains of productive cough and chest pain.  The patient denies shortness of breath with activity, shortness of breath at rest, non-productive cough, irregular heartbeats, acid heartburn, indigestion, loss of appetite, weight change, abdominal pain, difficulty swallowing, sore throat, tooth/dental problems, headaches, nasal congestion/difficulty breathing through nose, and sneezing.    Vital Signs:  Patient profile:   70 year old female Height:      67 inches Weight:      139.50 pounds BMI:     21.93 O2 Sat:  92 % on Room air Pulse rate:   129 / minute BP sitting:   118 / 70  (right arm) Cuff size:   regular  Vitals Entered By: Reynaldo Minium CMA (March 29, 2010 9:54 AM)  O2 Flow:  Room air CC: Follow up visit-Bronchiectasis; not coughing up  blood anymore x 1 week.   Physical Exam  Additional Exam:  General: A/Ox3; pleasant and cooperative, NAD, SKIN: no rash, lesions NODES: no lymphadenopathy HEENT: [place]/AT, EOM- WNL, Conjuctivae- clear, PERRLA, TM-WNL, Nose- clear, Throat- clear and wnl, Mallampatii II NECK: Supple w/ fair ROM, JVD- none, normal carotid impulses w/o bruits Thyroid-  CHEST:harsh cough here- dry, wheezy, no blood. Coarse crackles bilaterally, especially right upper anterior  HEART: RRR, no m/g/r heard ABDOMEN:-soft EAV:WUJW, nl pulses, no edema  NEURO: Grossly intact to observation      CT of Chest  Procedure date:  03/01/2010  Findings:      CT CHEST W/CM - [id number]   Clinical Data: Hemoptysis.  Bronchiectasis.   CT CHEST WITH CONTRAST   Technique:  Multidetector CT imaging of the chest was performed following the standard protocol during bolus administration of intravenous contrast.   Contrast: 75 ml Omnipaque-300   Comparison: Multiple prior chest x-rays.   Findings: The chest wall is unremarkable.  No obvious breast masses, supraclavicular or axillary adenopathy.  The bony thorax is intact.   The heart is normal in size.  No pericardial effusion.  No mediastinal or hilar adenopathy.  The esophagus is grossly normal. The aorta is normal in caliber.  No dissection.  Coronary artery calcifications are noted.   Examination of the lung parenchyma demonstrates extensive the cystic changes in both lungs.  This is most pronounced in the right upper lobe and right middle lobe but also noted in the left upper lobe and lingula.  These findings are most likely secondary to cystic bronchiectasis with scattered air fluid levels.  No focal airspace consolidation, pulmonary edema or pleural effusion.  No worrisome pulmonary mass lesions or nodules.   The upper abdomen is unremarkable.   IMPRESSION:   1.  Extensive cystic air spaces in both lungs most likely secondary to cystic  bronchiectasis with scattered air fluid levels. 2.  No focal airspace consolidation, edema or effusions.   Read By:  Cyndie Chime,  M.D.     Released By:  Cyndie Chime,  M.D.  _______  Impression & Recommendations:  Problem # 1:  BRONCHIECTASIS (ICD-494.0) She is better than at last visit, but clearly has severe bronchiectasis. We discussed options and are going to try a new airway antiinflammatory- Daliresp, which we discused carefully.  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  Problem # 3:  HEMOPTYSIS UNSPECIFIED (ICD-786.30) Hemoptysis is related to bronvhiectasis and will flare when bronchitc inflammation increases. Being of aspirin likely helps. The following medications were removed from the medication list:    Levaquin 500 Mg Tabs (Levofloxacin) .Stacy Novak... 1 daily after food  Medications Added to Medication List This Visit: 1)  Daliresp 500 Mcg  .Stacy Novak.. 1 daily  Other Orders: Est. Patient Level IV (95621)  Patient Instructions: 1)  Please schedule a follow-up appointment in 2 months. 2)  Sample/ script Daliresp: 3)  1 daily to redcuce frequency of flair ups. Prescriptions: DALIRESP 500 MCG 1 daily  #30 x 0   Entered and Authorized by:   Waymon Budge MD   Signed  by:   Waymon Budge MD on 03/29/2010   Method used:   Print then Give to Patient   RxID:   [id number]      CT of Chest  Procedure date:  03/01/2010  Findings:      CT CHEST W/CM - [id number]   Clinical Data: Hemoptysis.  Bronchiectasis.   CT CHEST WITH CONTRAST   Technique:  Multidetector CT imaging of the chest was performed following the standard protocol during bolus administration of intravenous contrast.   Contrast: 75 ml Omnipaque-300   Comparison: Multiple prior chest x-rays.   Findings: The chest wall is unremarkable.  No obvious breast masses, supraclavicular or axillary adenopathy.  The  bony thorax is intact.   The heart is normal in size.  No pericardial effusion.  No mediastinal or hilar adenopathy.  The esophagus is grossly normal. The aorta is normal in caliber.  No dissection.  Coronary artery calcifications are noted.   Examination of the lung parenchyma demonstrates extensive the cystic changes in both lungs.  This is most pronounced in the right upper lobe and right middle lobe but also noted in the left upper lobe and lingula.  These findings are most likely secondary to cystic bronchiectasis with scattered air fluid levels.  No focal airspace consolidation, pulmonary edema or pleural effusion.  No worrisome pulmonary mass lesions or nodules.   The upper abdomen is unremarkable.   IMPRESSION:   1.  Extensive cystic air spaces in both lungs most likely secondary to cystic bronchiectasis with scattered air fluid levels. 2.  No focal airspace consolidation, edema or effusions.   Read By:  Cyndie Chime,  M.D.     Released By:  Cyndie Chime,  M.D.  _______

## 2010-11-29 ENCOUNTER — Encounter: Payer: Self-pay | Admitting: Internal Medicine

## 2010-11-30 ENCOUNTER — Ambulatory Visit (INDEPENDENT_AMBULATORY_CARE_PROVIDER_SITE_OTHER): Payer: Medicare Other | Admitting: Internal Medicine

## 2010-11-30 ENCOUNTER — Ambulatory Visit (INDEPENDENT_AMBULATORY_CARE_PROVIDER_SITE_OTHER)
Admission: RE | Admit: 2010-11-30 | Discharge: 2010-11-30 | Disposition: A | Discharge status: Home / Self Care | Payer: Medicare Other | Source: Ambulatory Visit | Attending: Internal Medicine | Admitting: Internal Medicine

## 2010-11-30 ENCOUNTER — Encounter: Payer: Self-pay | Admitting: Internal Medicine

## 2010-11-30 VITALS — BP 108/62 | HR 113 | Ht 67.0 in | Wt 147.4 lb

## 2010-11-30 DIAGNOSIS — J479 Bronchiectasis, uncomplicated: Secondary | ICD-10-CM

## 2010-11-30 DIAGNOSIS — A310 Pulmonary mycobacterial infection: Secondary | ICD-10-CM

## 2010-11-30 DIAGNOSIS — R042 Hemoptysis: Secondary | ICD-10-CM

## 2010-11-30 DIAGNOSIS — J449 Chronic obstructive pulmonary disease, unspecified: Secondary | ICD-10-CM

## 2010-11-30 MED ORDER — DOXYCYCLINE HYCLATE 100 MG PO TABS: ORAL_TABLET | ORAL | Status: DC

## 2010-11-30 NOTE — Assessment & Plan Note (Signed)
Left with substantial scarring, but we have not documented recurrence.

## 2010-11-30 NOTE — Assessment & Plan Note (Signed)
We will update PFT / 6 MWT and CXR, and recommend Pulm Rehab.

## 2010-11-30 NOTE — Assessment & Plan Note (Signed)
Severe chronic wet bronchiectasis. Failed Daliresp- intolerant. I will have her try maintenance doxycycline for suppression.

## 2010-11-30 NOTE — Progress Notes (Signed)
  Subjective:    Patient ID: Stacy Novak, female    DOB: [birth year], 70 y.o.   MRN: [id number]  HPI 70 yo F never smoker, followed for bronchiectasis/ COPD, with hx treated atypical AFB/ MAIC, occasional hemoptysis and GERD.  Last here July 29, 2010 after normal flora on sputum cultures in October. Last antibiotic was augmentin in December. She questions if she should have had longer than 1 week treatment. Daily productive cough- yellow to light green. Shortness of breath with exertion across parking lot, up steps. . She does use Flutter occasionally. . No recent blood, fever, sweats or chest pain. Trial of Daliresp caused malaise.    Review of Systems See HPI Constitutional:   No weight loss, Some night sweats, No- Fevers, chills, fatigue, lassitude. HEENT:   No headaches,  Difficulty swallowing,  Tooth/dental problems,  Sore throat,                No sneezing, itching, ear ache, nasal congestion, post nasal drip,   CV:  No chest pain,  Orthopnea, PND, swelling in lower extremities, anasarca, dizziness, palpitations  GI  No heartburn, indigestion, abdominal pain, nausea, vomiting, diarrhea, change in bowel habits, loss of appetite  Resp:Some-shortness of breath with exertion. ,  No coughing up of blood. .  No wheezing.   Skin: no rash or lesions.  GU: no dysuria, change in color of urine, no urgency or frequency.  No flank pain.  MS:  No joint pain or swelling.  No decreased range of motion.  No back pain.  Psych:  No change in mood or affect. No depression or anxiety.  No memory loss.      Objective:   Physical Exam General- Alert, Oriented, Affect-appropriate, Distress- none acute  Skin- rash-none, lesions- none, excoriation- none  Lymphadenopathy- none  Head- atraumatic  Eyes- Gross vision intact, PERRLA, conjunctivae clear secretions  Ears- Hearing -Normal-, canals, Tm L ,   R ,  Nose- Clear,  No-Septal dev, mucus, polyps, erosion, perforation   Throat-  Mallampati II , mucosa clear , drainage- none, tonsils- atrophic  Neck- flexible , trachea midline, no stridor , thyroid nl, carotid no bruit  Chest - symmetrical excursion , unlabored     Heart/CV- RRR , no murmur , no gallop  , no rub, nl s1 s2                     - JVD- none , edema- none, stasis changes- none, varices- none     Lung-, Diffuse bilateral squeaks and wheeze, unlabored dullness-none, rub- none   Room air O2 sat 96%     Chest wall-  Abd- tender-no, distended-no, bowel sounds-present, HSM- no  Br/ Gen/ Rectal- Not done, not indicated  Extrem- cyanosis- none, clubbing, none, atrophy- none, strength- nl  Neuro- grossly intact to observation         Assessment & Plan:

## 2010-11-30 NOTE — Patient Instructions (Signed)
Orders- CXR- dx bronchiectasis               See [facility] for referral to Pulmonary Rehab               Schedule PFT and 6 MWT   Try daily doxycycline for maintenance suppression of the airway inflammation. Script sent to drug store

## 2010-12-04 ENCOUNTER — Encounter: Payer: Self-pay | Admitting: Internal Medicine

## 2010-12-04 NOTE — Assessment & Plan Note (Signed)
Previous occasional blood consistent with benign hemorrhagic bronchitis. No recent recurrence.

## 2010-12-16 NOTE — Progress Notes (Signed)
Quick Note:  Pt aware of results. ______ 

## 2010-12-21 ENCOUNTER — Ambulatory Visit (INDEPENDENT_AMBULATORY_CARE_PROVIDER_SITE_OTHER): Payer: Medicare Other | Admitting: Internal Medicine

## 2010-12-21 DIAGNOSIS — J479 Bronchiectasis, uncomplicated: Secondary | ICD-10-CM

## 2010-12-21 DIAGNOSIS — J449 Chronic obstructive pulmonary disease, unspecified: Secondary | ICD-10-CM

## 2010-12-21 DIAGNOSIS — R0600 Dyspnea, unspecified: Secondary | ICD-10-CM

## 2010-12-21 DIAGNOSIS — R0609 Other forms of dyspnea: Secondary | ICD-10-CM

## 2010-12-21 NOTE — Progress Notes (Signed)
PFT done today. 

## 2010-12-30 ENCOUNTER — Encounter: Payer: Self-pay | Admitting: Internal Medicine

## 2011-01-03 NOTE — Assessment & Plan Note (Signed)
[facility]                             PULMONARY OFFICE NOTE   NAME:FOREMANLarcenia, [name]                       MRN:          [id number]  DATE:12/28/2006                            DOB:          [birth year]    PROBLEMS:  1. Recurrent Mycobacterium avium bronchopneumonia.  2. Bronchiectasis.  3. Progressive fibrocavitary scarring.  4. Chronic obstructive pulmonary disease.  5. Tendency to develop gastroenteritis with antibiotics, causing rapid      electrolyte imbalance.   HISTORY:  She continues triple therapy with azithromycin, rifampin and  ethambutol 3 days a week.  She is under considerable stress trying to  deal with her elderly mother in [place], where she has been much of  the time recently.  She feels medically stable with some productive  cough most days, certainly no worse.  Some shortness of breath with  exertion.  No chest pain, fever or adenopathy.  She has a follow-up  visit with Dr. Ninetta Lights next week.  No acute concerns.   MEDICATIONS:  Hydrochlorothiazide, Nexium, azithromycin, rifampin,  ethambutol as noted.   DRUG INTOLERANCE:  Avelox, Sulfa, erythromycin.   OBJECTIVE:  VITAL SIGNS:  Weight 143 pounds, which is down a little, BP  128/62, pulse 103, room air saturation 94%.  CHEST:  Quiet.  I do not hear rales, rhonchi or crackles today.  Work of  breathing is not increased.  She is not coughing.  CARDIAC:  Heart sounds are regular.  I do not hear a murmur or gallop.  I do not find adenopathy.   IMPRESSION:  1. Chronic obstructive pulmonary disease with bronchiectasis.  2. Mycobacterium avium intracellulare complex, on chronic therapy.   PLAN:  No real hope that it will add much, but I am giving her a sample  of Spiriva to try one inhalation daily for evaluation.  Otherwise,  continue current therapy and return 6 months, earlier p.r.n.     Stacy D. Maple Hudson, MD, Stacy Novak, Stacy Novak  Electronically Signed   CDY/MedQ  DD: 12/28/2006   DT: 12/29/2006  Job #: [id number]   cc:   Juline Patch, M.D.  Lacretia Leigh. Ninetta Lights, M.D.  [facility]

## 2011-01-03 NOTE — Assessment & Plan Note (Signed)
[facility]                             PULMONARY OFFICE NOTE   NAME:FOREMANOlympia, Adelsberger                       MRN:          [id number]  DATE:06/27/2007                            DOB:          1940-12-19    PROBLEMS:  1. Recurrent mycobacterium avium bronchopneumonia.  2. Bronchiectasis.  3. Progressive fibrocavitary scarring.  4. Chronic obstructive pulmonary disease.  5. Tendency to develop gastroenteritis with antibiotics causing rapid      electrolyte imbalance.   HISTORY:  Tip of tongue has been burning for the last month or so.  Chronic cough remains productive mostly of white, sometimes yellow  sputum. There is no night sweat or fever. She saw Dr. Ninetta Lights in May.  She has now been on triple antibiotic therapy three times a week for a  year to treat her [facility]. She has had flu vaccine.   OBJECTIVE:  Weight 150 pounds, blood pressure 124/72, pulse 70, room air  saturation 96%. There are expiratory squeaks, especially in the right  upper lobe, unlabored. The tip of her tongue is a little smooth and red.  I do not see thrush. I do not find adenopathy. Heart sounds are regular  without murmur.   IMPRESSION:  1. Mild glossitis, non-specific.  2. Bronchiectasis.  3. History of mycobacterium avium.  4. Fibrocavitary chronic obstructive pulmonary disease.   PLAN:  1. Chest x-ray.  2. Sputum for AFB and for routine culture.  3. Kenalog with Orabase dental paste to place inside lower teeth where      tip of tongue rubs.  4. I suspect that Dr. Ninetta Lights will feel that it is time to stop her      antibiotics and observe when he sees her next. Hopefully, we will      have our cultures back.  5. Schedule return to me in 6 months, earlier p.r.n.     Clinton D. Maple Hudson, MD, Tonny Bollman, FACP  Electronically Signed    CDY/MedQ  DD: 06/29/2007  DT: 06/30/2007  Job #: [id number]   cc:   Juline Patch, M.D.  Lacretia Leigh. Ninetta Lights, M.D.

## 2011-01-04 ENCOUNTER — Other Ambulatory Visit: Payer: Self-pay | Admitting: Internal Medicine

## 2011-01-06 NOTE — Discharge Summary (Signed)
[place]  Patient:    Stacy Novak, Stacy Novak Visit Number: [id number] MRN: [id number]          Service Type: MED Location: [phone] Attending Physician:  Jetty Duhamel Driver Dictated by:   Rennis Chris. Maple Hudson, M.D. Admit Date:  10/31/2001 Discharge Date: 11/04/2001                             Discharge Summary  DISCHARGE DIAGNOSES: 1. Pneumonia not otherwise specified. 2. Bronchiectasis with acute exacerbation. 3. Mycobacterium avium. 4. Right hilar mass, probably lymphadenitis. 6. Gastroesophageal reflux. 7. Hypokalemia secondary to vomiting. 8. Hypoglycemia.  BRIEF HISTORY:  A 70 year old white female nonsmoker with chronic bronchiectasis related to pneumonia during her high school years and probably aspiration from esophageal reflux.  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  She was clinically stable until May 2002 when she had dyspnea, leukocytosis, and bronchoscopy again cultured Mycobacterium avium.  X-rays showed fibrocystic scarring consistent with bronchiectasis.  She was treated with Biaxin and Myambutol from May 2002 until March 2003.  The acute admission was precipitated by recent onset of nausea, vomiting, malaise, dyspnea, and productive cough.  Office chest x-ray showed increased densities in areas of previous scarring suggesting bacterial superinfection primarily in the upper lobes and at the right hilum.  She was admitted for stabilization.  Physical examination on admission was significant for bilateral rhonchi with normal heart sounds, no hepatosplenomegaly, no cyanosis or edema.  HOSPITAL COURSE:  Biaxin and Myambutol were stopped because of nausea and vomiting.  She was begun on Rocephin and Zithromax with Protonix for reflux control.  She gradually improved.  PPD skin test was negative at 48 hours. Antibiotic coverage was switched to  p.o. Ceftin.  She was discharged home with condition improved, afebrile, with less cough.  DIAGNOSTIC DATA:  CT scan of chest with contrast showed bronchiectasis with secondary infection.  There was concern about the right paratracheal lymph node or tumor and possibly inflammatory.  The radiologist suggested repeat when the patient was clinically stabilized.  Admission white blood count was 7800 with hemoglobin 13.4, hematocrit 39.6, normal platelet count, 73% neutrophils, 18% lymphocytes, sedimentation rate 114, 1 eosinophil.  Chemistry profile on admission was significant for potassium of 3.1, glucose 155 with IVs running, BUN 8, creatinine 0.7, albumin 2.8.  Liver enzymes were normal except for alkaline phosphatase elevated at 152.  Urinalysis was unremarkable.  Sputum culture grew normal flora, negative for AFB.  Nasal swabs for influenza negative for influenza A and influenza B. At discharge, temperature was 97.9, oxygen saturation was 94% on room air.  FINAL IMPRESSION:  Community-acquired secondary infection, possibly viral but probably a bacterial pneumonia superimposed on severe chronic bronchiectatic scarring. Mycobacteria avium had been treated for 10 months, and that therapy will be left off.  DISCHARGE MEDICATIONS: 1. Ceftin 500 mg b.i.d. x 5 more days. 2. Nexium 40 mg daily using home supply.  ACTIVITY:  As tolerated.  DIET: As tolerated.  FOLLOWUP:  In two weeks, earlier p.r.n., anticipating recheck of chemistry and chest x-ray in the office. Dictated by:   Rennis Chris. Maple Hudson, M.D. Attending Physician:  Jetty Duhamel Driver DD:  08/65/78 TD:  11/14/01 Job: [id number]

## 2011-01-06 NOTE — H&P (Signed)
NAME:  Stacy Novak, Stacy Novak                          ACCOUNT NO.:  [id number]   MEDICAL RECORD NO.:  [id number]                   PATIENT TYPE:  INP   LOCATION:  4705                                 FACILITY:  [facility]   PHYSICIAN:  Oley Balm. Sung Amabile, M.D. [facility]          DATE OF BIRTH:  October 24, 1940   DATE OF ADMISSION:  08/22/2003  DATE OF DISCHARGE:                                HISTORY & PHYSICAL   ADMISSION DIAGNOSES:  1. Bronchiectasis.  2. Probable pneumonia.   HISTORY OF PRESENT ILLNESS:  Stacy Novak is a 70 year old woman with a  history of atypical mycobacterial pulmonary infection treated with a  prolonged course of antibiotics in the year 2002 and 2003. She developed  increased shortness of breath, cough, and purulent sputum this past week.  She contacted Dr. Roxy Cedar office, and he prescribed Ketek over the phone.  Her symptoms have progressed, and she has had problems tolerating the  medication with lots of nausea and some vomiting. At the present time, she  presents to the emergency department with moderate dyspnea, cough productive  of green mucous, shortness of breath, nausea, and is febrile. She is also  noted to be tachycardic. She denies hemoptysis. There is no lower extremity  edema or calf tenderness. There is no pleuritic or anginal chest pain.   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 She also has a documented history of gastroesophageal reflux  disease with hiatal hernia. There is no history of hypertension, coronary  disease, diabetes, thromboembolic disease, or hypercholesterolemia.   SOCIAL HISTORY:  She never smoked. No significant occupational exposures.   FAMILY HISTORY:  Noncontributory and previously reviewed.   REVIEW OF SYSTEMS:  This is as above and is otherwise negative.   PHYSICAL EXAMINATION:  VITAL SIGNS:  Temperature 102.8, blood pressure  177/83, pulse 140 (regular), respirations 22, oxygen saturation  98% on 2  liters by nasal cannula.  GENERAL:  She appears moderately dyspneic at rest and is alert and oriented.  She answers appropriately.  HEENT:  Reveals tympanic canals and membranes. Nasal mucosa is pink and  moist. Oropharynx reveals no exudates, no erythema.  NECK:  Supple without adenopathy or jugular venous distention.  CHEST:  Reveals normal percussion throughout. Breath sounds are slightly  diminished throughout. She has coarse crackles heard better anteriorly than  posteriorly in the upper lung zones on the right. She also has crackles in  the left base. There is no wheezing or bronchial breath sounds noted.  CARDIAC:  Reveals tachycardia with regular rhythm and no murmurs.  ABDOMEN:  Soft, nontender, with normal bowel sounds.  EXTREMITIES:  Without clubbing, cyanosis, or edema.   DATA:  Chest x-ray today reveals infiltrative changes in the right upper  lobe and right midlung zone. Also infiltrative changes in the left mid lung  zone. I have no prior chest x-rays for comparison, but I have seen  a CT scan  from March of 2003, and most of the changes appear to be chronic related to  her rather severe bronchiectasis. Laboratory data is notable for a normal  white count, hemoglobin, hematocrit, and platelet count. Preliminary  chemistries reveal a sodium of 132 with a potassium of 2.8.   IMPRESSION:  1. 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  2. Superimposed acute exacerbation of bronchiectasis versus pneumonia. The     high fever would argue for pneumonia. The absence of an elevated white     count would perhaps argue against this. In general, I regard as a moot     point as both of the conditions would be treated with similar     antibiotics.   PLAN/RECOMMENDATIONS:  She will be admitted to telemetry to monitor the  tachycardia. She will be treated with broad-spectrum antibiotics after a  sputum gram stain is obtained. This will be cefepime plus Avelox  to include  atypical coverage as well as optimal pneumococcal coverage. Potassium has  been repleted and will be included in her intravenous fluids. Further  therapy will be dictated by the hospital course.                                                Oley Balm Sung Amabile, M.D. [facility]    DBS/MEDQ  D:  08/22/2003  T:  08/22/2003  Job:  [id number]   cc:   Joni Fears D. Young, M.D.  [address] [place]  [place] [address]  Fax: [phone]

## 2011-01-06 NOTE — Assessment & Plan Note (Signed)
[facility]                               PULMONARY OFFICE NOTE   NAME:FOREMANSharonlee, Nine                       MRN:          [id number]  DATE:03/08/2006                            DOB:          11/30/1940    PULMONARY FOLLOWUP:   PROBLEM:  1.  Recurrent mycobacterium avium bronchopneumonia.  2.  Bronchiectasis.  3.  Progressive fibrocavitary scarring.  4.  Chronic obstructive pulmonary disease.  5.  Tendency to develop gastroenteritis with antibiotics, causing rapid      electrolyte imbalance.   HISTORY:  She was seen in infectious disease consultation by Dr. Ninetta Lights.  She has begun rifampin 600 mg, ethambutol 1600 mg and azithromycin 500 mg  three times a week, and so far has tolerated the first few doses.  We  reviewed her previous experience, noting that she has had pneumococcal  vaccine twice.  Cough is productive, usually of yellow sputum with little  blood currently.  Easy exertional dyspnea.  No significant pain or  palpitation, fever, chills or adenopathy.  GI symptoms currently are normal.   MEDICATIONS:  1.  HCTZ.  2.  Nexium.  3.  Azithromycin.  4.  Ethambutol.  5.  Rifampin three times a week.   OBJECTIVE:  VITAL SIGNS:  Weight 147 pounds, blood pressure 122/60, pulse  113, room air saturation 96%.  GENERAL:  She looks fairly comfortable today.  She does have some dry cough,  and there are scattered squeaks and expiratory wheezes bilaterally without  dullness.  Heart sounds are regular without murmur.  I do not find  adenopathy or edema.   IMPRESSION:  Recurrent mycobacterium avium, bronchitis and significant  fibrocavitary scarring, as noted.   PLAN:  She will continue antibiotic regimen as established by Dr. Ninetta Lights  and followup with him as instructed.  She will return to see me in 3 months.  She does ask that I provide Phenergan 25 mg rectal suppositories to have on  hand so that she can head off nausea, vomiting and  dehydration should it  develop.  We discussed this strategy.  She will need PFTs and followup  chest x-ray when she is a little more stable on these antibiotics.  Consider  pulmonary rehabilitation.                                   Clinton D. Maple Hudson, MD, FCCP, FACP   CDY/MedQ  DD:  03/11/2006  DT:  03/12/2006  Job #:  [id number]   cc:   Lacretia Leigh. Ninetta Lights, MD  Juline Patch, MD

## 2011-01-06 NOTE — H&P (Signed)
NAME:  Stacy Novak, Stacy Novak NO.:  [id number]   MEDICAL RECORD NO.:  [id number]                   PATIENT TYPE:  INP   LOCATION:  1826                                 FACILITY:  [facility]   PHYSICIAN:  Renato Battles, M.D.                  DATE OF BIRTH:  04/11/1941   DATE OF ADMISSION:  03/05/2004  DATE OF DISCHARGE:                                HISTORY & PHYSICAL   REASON FOR ADMISSION:  Cough, fever, and shortness of breath.   HISTORY OF PRESENT ILLNESS:  The patient is a very pleasant 70 year old  white female who for the last two weeks has been experiencing progressively  worse cough, nonproductive, in addition to shortness of breath, and fever.  Her temperature was 102 today.  The patient went to see her pulmonologist,  Dr. Joni Fears D. Young, and he prescribed her with amoxicillin for a week.  However, the patient did not improve on the amoxicillin.  Things got much  worse today and then she presented to the emergency room.   The patient denies any chest pain.  She reported some of grandchildren have  been sick with the flu or cold.  No travel history.   REVIEW OF SYMPTOMS:  CONSTITUTIONAL:  Positive for fever and sweating.  No  weight changes.  GASTROINTESTINAL:  Positive for nausea but no vomiting.  No  diarrhea or constipation.  CARDIOPULMONARY:  Positive for cough, shortness  of breath.  Non chest pain, orthopnea, no PND.  GENITOURINARY:  No dysuria,  hematuria, or retention.  Stacy Novak has swallowing problems.   PAST MEDICAL HISTORY:  1. Bronchiectasis with atypical microbacteria.  As a result, she has     frequent pneumonia episodes.  2. Recent admission to the hospital in January of 2005 by Dr. Onalee Hua B.     Simonds for similar episodes which was subsequently treated with cefepime     and Avelox.  3. GERD.  4. History of hypokalemia.  Otherwise no issues.   SOCIAL HISTORY:  No smoking, alcohol, or drugs.  She lives with her husband.   FAMILY HISTORY:  Noncontributory.   ALLERGIES:  SULFA and ERYTHROMYCIN.   MEDICATIONS:  1. Nexium 40 mg p.o. q.d.  2. K-Dur 10 mEq p.o. q.d.  3. Amoxicillin.   PHYSICAL EXAMINATION:  GENERAL:  She is alert and oriented x 3 in mild  distress.  VITAL SIGNS:  Temperature 100.9, heart rate 140s, respiratory rate 24, blood  pressure 140/72.  HEENT:  Normocephalic and atraumatic.  Pupils are equal, round, and reactive  to light and accommodation.  Extraocular movements intact bilaterally.  Mucus membranes are moist, pink with no lesions.  NECK:  No lymphadenopathy, no thyromegaly, and no JVD.  CHEST:  Positive for right lobe rales and rhonchi.  Otherwise no  significant.  HEART:  Regular rhythm.  Tachycardia.  No murmurs.  ABDOMEN:  Soft, nontender, and  nondistended.  No active bowel sounds.  EXTREMITIES:  No clubbing, cyanosis, or edema.   LABORATORY DATA:  CBC showed an elevated white count of 13.3 with 85%  neutrophils.  Normal hemoglobin at 12.5, normal platelets 414,000.  Electrolytes showed an elevated glucose at 124, low albumin at 3.1.  Otherwise, all electrolytes and liver functions were all within normal  limits.   Chest x-ray showed no acute processes; however, it is hard to be sure given  the extensive scarring.  EKG showed the sinus tachycardia with pulmonary  disease pattern and left anterior fascicular block.   ASSESSMENT/PLAN:  1. Bronchiectasis:  This lady seemed to have developed pneumonia on top of     her bronchiectasis.  I am going to start treatment with intravenous     cefepime and intravenous Avelox which was the choice of the pulmonologist     on her last admission.  Systemic treatment with bronchodilators,     Albuterol and Atrovent will be used for symptomatic relief.  Blood     cultures and sputum culture will be ordered as well.  2. Nausea will be symptomatically treated with Phenergan.  3. Gastroesophageal reflux disease:  Will be treated with  Protonix.  4. Hyperglycemia:  This is most likely a stress response.  We are going to     check Accu-Cheks for 24 hours to make sure the patient does not have     diabetes.                                                Renato Battles, M.D.    SA/MEDQ  D:  03/05/2004  T:  03/05/2004  Job:  [id number]   cc:   Juline Patch, M.D.  [address]  [place], [place] [address]  Fax: [phone]   Clinton D. Young, M.D.  [address] [place]  [place] [address]  Fax: [phone]

## 2011-01-06 NOTE — Discharge Summary (Signed)
NAME:  Stacy Novak, Stacy Novak                          ACCOUNT NO.:  [id number]   MEDICAL RECORD NO.:  [id number]                   PATIENT TYPE:  INP   LOCATION:  4705                                 FACILITY:  [facility]   PHYSICIAN:  Clinton D. Maple Hudson, M.D.              DATE OF BIRTH:  August 18, 1941   DATE OF ADMISSION:  08/22/2003  DATE OF DISCHARGE:  08/27/2003                                 DISCHARGE SUMMARY   DISCHARGE DIAGNOSES:  1. Pneumonia, organism NOS.  2. Bronchiectasis with acute exacerbation.  3. Hypovolemia.  4. Hemoptysis.  5. Gastroenteritis.  6. Hypokalemia.   BRIEF HISTORY:  a 70 year old non-smoker with chronic bronchiectasis and  previous prolonged treatment for mycobacterium, AVM intracellular pneumonia,  admitted with a syndrome of acute bronchitis and gastroenteritis.  She had  taken [facility] as an outpatient.  A previous exacerbation had a similar pattern  of gastroenteritis triggering dehydration.   PHYSICAL EXAM 0N ADMISSION:  Significant for a temperature of 100,  saturation 98% on oxygen 2 liters.  EKG showed sinus tachycardia with some  associated ST and axis changes with appeared to be rate related.  There were  bilateral rhonchi.  Heart sounds were normal.  Abdominal exam was benign.   HOSPITAL COURSE:  She was given intravenous fluids for hydration.  Antibiotic coverage was with cefepime and Avelox.  She was given  supplemental oxygen and nebulized bronchodilators.  Lopressor was used to  control heart rate.  She had some teaspoon hemoptysis associated with a  small air fluid level and a cystic area in the left mid lung zone and this  was interpreted as a benign hemorrhagic bronchitis complicating her  bronchitic exacerbation.  She has had episodic hemoptysis before.  She has  had episodic hemoptysis before.  She blamed Avelox for malaise and this was  discontinued.  She steadily improved and was afebrile.  As she became able  to eat again and hemoptysis  resolved.  She was considered to have improved  sufficiently for discharge home.   LABORATORY DATA:  Electrocardiograms showed sinus tachycardia.  Axis was  horizontal at a heart rate of 123.  Chest x-ray showed acute air space  disease superimposed on severe chronic lung disease with normal heart size,  no pleural effusions.  There was extensive bronchiectasis in the right  middle and right upper lung zones.  Multiple areas of fibrosis and  peripheral cystic changes.  Chest x-ray was stable on follow up.  ABG on  admission by ISTAT reported a pH of 7.47, PCO2 of 31, bicarbonate of 23.  Initial white blood count was 8400, falling to 7100, hemoglobin was 40,  falling to 35.9 with normal indices.  Differential on admission was  significant for 86% neutrophils.  Protime and PTT were normal.  Admission  potassium was 2.8 and this was corrected to 3.7.  Glucoses ran 108 to 126  during acute infection.  BUN was less than 1, reflecting poor p.o. intake  initially, with creatinines of 0.5 and 0.6, albumin of 2.2.  There was a  minor elevation of AST, otherwise liver enzymes were normal.  Cardiac  enzymes showed no evidence of myocardial injury.  Blood cultures showed no  growth.  Sputum showed rare gram positive Cocci but was signed out as normal  oropharyngeal flora.   DISCHARGE PLANS:  She was discharged home for office follow up in 2 to 3  weeks.   DISCHARGE MEDICATIONS:  1. Nexium 40 mg daily.  2. Ceftin 250 mg b.i.d. x7 days.  3. K-Dur 10 mEq daily.   Activity and diet unrestricted.  Note that oxygen saturations ranged around  95 to 95% and she was comfortable on room air at discharge.                                                Clinton D. Maple Hudson, M.D.    CDY/MEDQ  D:  09/08/2003  T:  09/09/2003  Job:  [id number]

## 2011-01-06 NOTE — H&P (Signed)
[place]  Patient:    Stacy Novak, Stacy Novak Visit Number: [id number] MRN: [id number]          Service Type: MED Location: [phone] Attending Physician:  Jetty Duhamel Driver Dictated by:   Rennis Chris. Maple Hudson, M.D. Admit Date:  10/31/2001                           History and Physical  ADMITTING DIAGNOSES: 1. Pneumonia. 2. Nausea with anorexia. 3. Bronchiectasis with fibrocystic scarring. 4. Mycobacterium avium with infiltrates progressive on therapy. 5. Gastroesophageal reflux disease.  HISTORY:  This is a 70 year old white female nonsmoker admitted with chief complaint of nausea, cough, anorexia, shortness of breath, and joint pain x3 days.  She has a long history of bronchiectasis associated with pneumonias going back to the time of high school with suspected component from aspiration and gastroesophageal reflux in the past.  X-rays have shown fibrocystic scarring.  Bronchoscopy in 1987 was read as showing a lymphocytic interstitial pneumonia and cultured Mycobacterium avium.  She was watched and treated symptomatically and was clinically stable until May 2002 when she was admitted to [facility] with dyspnea and leukocytosis.  Bronchoscopy there cultured Mycobacterium avium.  She was started on Biaxin XL 500 mg x2 q.d. and Myambutol 400 mg x3 q.d. which she has taken daily until the last few days, a total of about 10 months.  She appeared stable on office evaluation in December.  Pulmonary function tests have shown moderately severe obstructive airways disease and chest x-rays have shown fibrocystic scarring, especially in the right upper lung zone.  Three days ago, she developed acute onset of nausea and vomiting without abdominal pain or diarrhea.  Her Biaxin and Myambutol were stopped and she was treated with Phenergan.  By today she has been no better.  Poor p.o. intake and productive cough with yellow to white, sometimes  brown, nonbloody sputum.  She is complaining of pains in her joints, low-grade fever, and marked malaise and weakness.  Chest x-ray today in the office showed denser infiltrates in the same area as previously involved the right upper lobe, left upper lobe, and right hilum without effusion, adenopathy, or cardiomegaly.  She is admitted now to stabilize and reassess.  REVIEW OF SYSTEMS:  Weight had been around 157 in October, up to about 165 in December, and back now to 157.  Low-grade fever, maybe some mild chills.  No rash or pruritus.  Tussive headache without sinus drainage or stiff neck. Hard and tiresome coughing producing white to yellow or brown nonbloody sputum.  No chest pain.  No palpitations.  Nausea and retching with cough. Marked anorexia.  No ankle edema.  There has been no blood, no dysuria.  PAST HISTORY:  COPD from a bronchiectasis syndrome with fibrocystic scarring, originally attributed to several pneumonias around the time of high school. Mycobacterium avium cultured in 1985 and in 2002 by bronchoscopy.  No exposure and all cultures previously negative for tuberculosis.  Gastroesophageal reflux disease with hiatal hernia.  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  Blood work in 1999 including ANCA and ANA were negative.  No history of heart disease, hypertension, diabetes, DVT/pulmonary embolism, liver or kidney disease, or cancer.  SURGERY:  Limited to tonsils and bronchoscopy x2.  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 Married.  Never smoked.  FAMILY HISTORY:  Mother had hypertension and cardiac arrhythmias.  Father  had hypertension.  Nobody in the family known to have significant respiratory disease.  MEDICATION ALLERGIES:  SULFA caused rash.  ADDITIONAL PAST MEDICAL HISTORY:  Shingles left trunk, treated with acyclovir in 2002.  EKG stress test by Dr. Charolette Child understood by patient to  be nonspecific.  She was given nitroglycerin to hold but never taken. Pneumococcal vaccine given in 1988 and 2000.  Flu vaccine current.  PHYSICAL EXAMINATION:  VITAL SIGNS:  Blood pressure 126/80, pulse 96 and regular, respirations unlabored now.  GENERAL:  She is alert, oriented, and calmer.  In the office this afternoon, she was nauseated, retching, and coughing hard.  SKIN:  No rash.  ADENOPATHY:  None found.  HEENT:  Oral mucosa clear.  No JVD or stridor.  Nasal airway clear.  Pupils reactive.  Speech clear.  No stridor or thyromegaly.  CHEST:  Bilateral rhonchi, right greater than left.  Breathing is currently unlabored.  No rales, rub, or dullness.  HEART:  Regular rhythm without murmur or gallop.  ABDOMEN:  Without hepatosplenomegaly.  Quiet, nontender.  BREASTS/GYNECOLOGICAL/RECTAL:  Not examined.  EXTREMITIES:  No cyanosis, clubbing, edema, tremor, cords, or calf tenderness.  IMPRESSION:  The acute process may be unrelated to her chronic illness and I suspect a viral syndrome causing recent onset of nausea, vomiting, and increased cough.  Her Mycobacterium avium has not obviously responded to 10 months of Biaxin and Myambutol but we need new culture information.  There are fairly dense infiltrates around areas of fibrocystic scarring in the upper lobes and at the right hilum that look worse on todays film than comparison film in December.  Whether this is progression of Mycobacterium avium or some other process including a community-acquired pneumonia is not yet clear.  Her persistent nausea and vomiting despite Phenergan over the last several days does not suggest that she is about to rapidly improve on home care.  We are starting IV fluids, watching her p.o. intake, and treating the nausea as a gastroenteritis.  We are stopping the Biaxin and Myambutol which may have contributed to the GI distress recently, and we will reassess the  pulmonary infiltrates. Dictated by:   Rennis Chris. Maple Hudson, M.D. Attending Physician:  Jetty Duhamel Driver DD:  16/10/96 TD:  11/01/01  Job: [id number]

## 2011-01-06 NOTE — Assessment & Plan Note (Signed)
[facility]                               PULMONARY OFFICE NOTE   NAME:Stacy Novak, Stacy Novak                       MRN:          [id number]  DATE:05/30/2006                            DOB:          09/17/1940    PROBLEMS:  1. Recurrent microbacterium avium bronchopneumonia.  2. Bronchiectasis.  3. Progressive fibrocavitary scarring.  4. Chronic obstructive pulmonary disease.  5. Tendency to develop gastroenteritis with antibiotics, causing rapid      electrolyte imbalance.   HISTORY OF PRESENT ILLNESS:  She returns for scheduled follow up as she  continues triple antibiotic therapy under guidance by Dr. Ninetta Lights.  She  thinks the Azithromycin causes a little stomach upset, but she is basically  stable, and she continues this therapy on a three times per week schedule.  She has noted a little increased cough in the last few weeks, still  sometimes productive, but overall the volume is less, and it is generally  white or yellow.  Shortness of breath with exertion changes a little from  one day to the next, blamed on something in the air.  On review, she has  had pneumococcal vaccine twice and would like flu vaccine today.   MEDICATIONS:  1. HCTZ.  2. Nexium.  3. Azithromycin/Ethambutol/rifampin.  4. TIW.   OBJECTIVE:  VITAL SIGNS:  Weight 149 pounds, which is stable, blood pressure  141/82, pulse regular at 113, room air saturation 93%.  LUNGS:  There are crackles, particularly in the right upper lung zone.  Work  of breathing is not increased.  I find no adenopathy, clubbing or edema.  HEART:  Sounds are regular without murmur.   IMPRESSION:  Chronic bronchitis and bronchiectasis, clinically stable.  Microbacterium avium pneumonia.   PLAN:  1. Chest x-ray and pulmonary function tests were scheduled.  2. Flu vaccine.  3. We discussed possibility of pulmonary rehabilitation, but she is      encouraged to walk regularly.  4. Schedule return in  three months, earlier p.r.n., and keep scheduled      follow up with Dr. Ninetta Lights and Dr. [name].       Clinton D. Maple Hudson, MD, [facility], FACP      CDY/MedQ  DD:  05/30/2006  DT:  06/01/2006  Job #:  [id number]   cc:   Juline Patch, M.D.  Lacretia Leigh. Ninetta Lights, M.D.

## 2011-01-06 NOTE — Discharge Summary (Signed)
[facility]  Patient:    Stacy Novak, Stacy Novak Visit Number: [id number] MRN: [id number]          Service Type: MED Location: [phone] Attending Physician:  Jetty Duhamel Driver Dictated by:   Rennis Chris. Maple Hudson, M.D. Admit Date:  01/21/2002 Discharge Date: 01/24/2002                             Discharge Summary  DISCHARGE DIAGNOSES: 1. Gastroenteritis. 2. Dehydration. 3. Hypokalemia. 4. Mycobacterium avium pneumonia, chronic. 5. Bronchiectasis.  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 Followed for the past year for Mycobacterium avium pneumonia confirmed by bronchoscopy and treated over the past year on maintenance Biaxin plus Myambutol. She had been hospitalized in the spring with an acute period of nausea and vomiting, gastroenteritis, considered to be viral but exacerbated by her medications. The present admission was triggered by persistent acute anorexia, nausea and vomiting over the past 3-4 days with poor p.o. intake, dehydration, and weakness. She had no diarrhea, abdominal pain or bleeding. She was febrile at home. She was initially brought to medical day care for IV fluids anticipating overnight observation but she remained febrile despite fluid replacement and Tylenol and ultimately was admitted. Temperature 102.2, pulse regular, blood pressure 140/72. No rash or adenopathy. Diffuse fremitus, congested cough. Abdomen nontender, nondistended.  Initial labs included a chest x-ray showing diffuse fibrocystic scarring possibly with more infiltrate in the right upper lobe. White blood count 6800, hemoglobin 13.4, differential count showed 78% PMNs. Potassium was 2.8, BUN 10, creatinine 0.7. She was felt to have an acute viral gastroenteritis probably aggravated by her maintenance Biaxin and Myambutol although these were stopped at  onset of her acute illness. Hypokalemia was attributed to the vomiting. The possibility of an acute bacterial pneumonia superimposed on her bronchiectasis could not be excluded.  HOSPITAL COURSE:  She was given intravenous hydration and started on Tequin for antibiotic coverage. Cultures were submitted. Potassium was replaced. She gradually became stronger and her GI distress resolved. She was discharged having reached maximum hospital benefit with final impression of probable viral gastroenteritis caught from her grandchildren.  LABORATORY DATA:  White blood count 7400, reported hemoglobin of 11.6, potassium 3.0, BUN 5, creatinine 0.6, albumin 2.5, glucose 98, AST 51, ALT 31, ALP 199, total bilirubin and amylase were normal. Urinalysis unremarkable. Blood cultures negative. Sputum culture showed Haemophilus influenzae, returning after discharge. Urinalysis grew 80,000 colonies but was not considered significant. AFB culture is pending but was negative on smear.  DISCHARGE PLAN:  She was to remain off of Biaxin and Myambutol taking Cipro 500 mg b.i.d. for 7 days and doxycycline for 7 days, Darvocet p.r.n. headache, potassium 10 mEq q.d., Phenergan 25 mg q. 8h p.r.n. nausea. Diet and activity were unrestricted. Office follow-up in two weeks, earlier p.r.n. Dictated by:   Rennis Chris. Maple Hudson, M.D. Attending Physician:  Jetty Duhamel Driver DD:  86/57/84 TD:  02/10/02 Job: [id number]

## 2011-01-06 NOTE — Assessment & Plan Note (Signed)
[facility]                             PULMONARY OFFICE NOTE   NAME:FOREMANJaedan, Schuman                       MRN:          [id number]  DATE:08/29/2006                            DOB:          11/20/1940    PROBLEM:  1. Recurrent Myco bacterium avium bronchopneumonia.  2. Bronchiectasis.  3. Progressive fibrocavitary scarring.  4. Chronic obstructive pulmonary disease.  5. A tendency to develop gastroenteritis with antibiotics causing      rapid electrolyte imbalance.   HISTORY:  She has now been on Zithromax, rifampin and ethambutol under  Dr. Moshe Cipro supervision since July.  She still has some productive  cough, bringing up a little bit of light green sputum but overall is  better.  She had 1 day when she coughed up a small amount of blood after  a hard coughing fit, but there has been no more bleeding.  She resolved  a bronchitic flare from November.  No recent fever, no chest pain.  She  is limited by exertional dyspnea.  She did not try for pulmonary  rehabilitation because her father died and she has been occupied with  family issues.   MEDICATION:  1. Hydrochlorothiazide.  2. Nexium.  3. Zithromax.  4. Ethambutol.  5. Rifampin taken 3 days per week for suppression of Myco bacterium      avium.   Drug intolerance of AVALOX with malaise, SULFA and ERYTHROMYCIN.   OBJECTIVE:  Weight 148 pounds.  Blood pressure 122/64.  Pulse regular  99.  Room air saturation 96%.  CHEST:  Is quite without rales, rhonchi or wheeze.  HEART:  Sounds are regular without murmur or gallop.  There is no peripheral edema, no adenopathy.   Pulmonary function tests on August 30, 2006 shows severe obstructive  airways disease with insignificant response to bronchodilator, moderate  restriction and mild reduction of diffusion capacity.  Her total lung  capacity was 63%, FEV1 was 1.03 L (47%).  Diffusion capacity was 71%.   IMPRESSION:  1. Chronic obstructive  pulmonary disease with chronic      bronchitis/bronchiectasis and fibrotic scarring with blebs.  2. Chronic Myco bacterium avium bronchitis.   PLAN:  1. She is encouraged to walk for endurance and to consider pulmonary      rehabilitation when she is able.  2. She will follow up with Dr. Ninetta Lights.  3. Return here in 4 months, earlier p.r.n.     Clinton D. Maple Hudson, MD, Tonny Bollman, FACP  Electronically Signed    CDY/MedQ  DD: 08/30/2006  DT: 08/31/2006  Job #: [id number]   cc:   Juline Patch, M.D.  Lacretia Leigh. Ninetta Lights, M.D.

## 2011-01-06 NOTE — Discharge Summary (Signed)
NAME:  Stacy Novak, Stacy Novak                          ACCOUNT NO.:  [id number]   MEDICAL RECORD NO.:  [id number]                   PATIENT TYPE:  INP   LOCATION:  5148                                 FACILITY:  [facility]   PHYSICIAN:  Clinton D. Maple Hudson, M.D.              DATE OF BIRTH:  [birth year]   DATE OF ADMISSION:  03/05/2004  DATE OF DISCHARGE:  03/09/2004                                 DISCHARGE SUMMARY   DISCHARGE DIAGNOSES:  1. Chronic obstructive pulmonary disease with acute exacerbation.  2. Bronchiectasis.  3. History of atypical Mycobacterium respiratory infection.  4. Gastroesophageal reflux disease.  5. Community-acquired pneumonia, no organism specified.  6. Hyperglycemia.  7. Hypokalemia.   BRIEF HISTORY:  [age]-year-old white female, nonsmoker, admitted to the  hospitalists' service with a temperature of 102.  She had taken p.o.  Augmentin as an outpatient in the previous week, but presented to the  emergency room with increasing shortness of breath, nonproductive cough and  malaise.   PAST MEDICAL HISTORY:  1. Mycobacterium avium intracellularity complex, previously treated with     prolonged antibiotic therapy including Biaxin.  2. Bronchiectasis with uncertain contributions from Mycobacterium avium and     from gastroesophageal reflux disease.  3. Several admissions in recent years for acute respiratory exacerbations     typically associated with anorexia, gastroenteritis and electrolyte     imbalance secondary to her anorexia.   EXAMINATION:  Examination on admission recorded temperature 100.9,  tachycardia to 140, tachypnea to 24, BP 140/72, with rales and rhonchi in  the right lung, no murmur, no adenopathy or edema and no cyanosis.   ADMISSION LABORATORIES:  Admission labs include a WBC of 13,300 with 85%  neutrophils, mild hyperglycemia at 124 attributed to stress and albumin of  3.1.   Chest x-ray was described as showing no acute process.  Films were  taken  during this admission, but reports are not available on this chart at  dictation.   An EKG is referred to in the admission history and physical as showing sinus  tachycardia with pulmonary disease pattern and left anterior fascicular  block, but that also was not included in the bound of the chart.   HOSPITAL COURSE:  Initial antibiotic coverage was provided with intravenous  Avelox.  Bronchodilator nebulization and supplemental oxygen were provided.  Pulmonary Medicine, Dr. Shan Levans, was consulted; she was then  transferred to my service because of familiarity.  She required intravenous  potassium supplementation.  Blood sugars came down, fever and stress  resolved.  Blood cultures were negative.  Sputum sample never made it to the  laboratory, but cough was generally nonproductive.  She was discharged to  home with condition improved.  It was felt from available information that  she had had a community-acquired pneumonia, although no organism was  identified, and exacerbation of her bronchiectatic COPD and blood sugar and  electrolyte imbalance related  to anorexia, and malaise of acute illness.  Intravenous azithromycin has been added to her antibiotic coverage before  the time of discharge.   LABORATORY:  Admission WBC of 13,000 fell to 5000 with treatment, hemoglobin  of 12.5 fell to 11.1, platelet counts were in the 400,000 range.  Initial  electrolytes significant for glucose of 124.  The next day, day 2 of  admission, sodium was 134, potassium 2.9, chloride 99, CO2 27, glucose 126.  Initial albumin was 3.1, total protein was 7.9.  At discharge, all  electrolytes and glucose were normal; BUN was 5, creatinine 0.5; liver  enzymes normal; all blood cultures negative at 5 days.   DISCHARGE PLANS:  1. Ketek 400 mg, #10, two daily for 5 days.  2. Hydrochlorothiazide to continue 12.5 mg daily.  3. Nexium 40 mg -- to continue 1 daily.   DIET AND ACTIVITY:   Unrestricted.   FOLLOWUP:  Office followup with Dr. Juline Patch for primary care and Dr.  Rennis Chris. Young for pulmonary.                                                Clinton D. Maple Hudson, M.D.    CDY/MEDQ  D:  04/05/2004  T:  04/05/2004  Job:  [id number]   cc:   Renato Battles, M.D.  Fax: [phone]   Juline Patch, M.D.  [address]  [place], [place] [address]  Fax: [phone]

## 2011-01-11 ENCOUNTER — Telehealth: Payer: Self-pay | Admitting: Internal Medicine

## 2011-01-11 MED ORDER — ESOMEPRAZOLE MAGNESIUM 40 MG PO CPDR: 40.0000 mg | DELAYED_RELEASE_CAPSULE | Freq: Every day | ORAL | Status: DC

## 2011-01-11 NOTE — Telephone Encounter (Signed)
Pt called back- says she is leaving "shorlty" and will be out of town. Wants this called in asap this am. Call pt asap at # given previously or [phone]. Tivis Ringer

## 2011-01-11 NOTE — Telephone Encounter (Signed)
Refill for nexium sent. According to centricity [facility] has filled this in the past with 11 refills. Refill sent. Pt aware. Carron Curie, CMA

## 2011-03-01 ENCOUNTER — Ambulatory Visit (INDEPENDENT_AMBULATORY_CARE_PROVIDER_SITE_OTHER): Payer: Medicare Other | Admitting: Internal Medicine

## 2011-03-01 ENCOUNTER — Encounter: Payer: Self-pay | Admitting: Internal Medicine

## 2011-03-01 VITALS — BP 156/80 | HR 117 | Ht 67.0 in | Wt 145.6 lb

## 2011-03-01 DIAGNOSIS — J449 Chronic obstructive pulmonary disease, unspecified: Secondary | ICD-10-CM

## 2011-03-01 DIAGNOSIS — J4489 Other specified chronic obstructive pulmonary disease: Secondary | ICD-10-CM

## 2011-03-01 DIAGNOSIS — J479 Bronchiectasis, uncomplicated: Secondary | ICD-10-CM

## 2011-03-01 DIAGNOSIS — A310 Pulmonary mycobacterial infection: Secondary | ICD-10-CM

## 2011-03-01 MED ORDER — LEVOFLOXACIN 500 MG PO TABS: 500.0000 mg | ORAL_TABLET | Freq: Every day | ORAL | Status: AC

## 2011-03-01 NOTE — Progress Notes (Signed)
Subjective:    Patient ID: Stacy Novak, female    DOB: 11/21/1940, 70 y.o.   MRN: [id number]  HPI    Review of Systems     Objective:   Physical Exam        Assessment & Plan:   Subjective:    Patient ID: Stacy Novak, female    DOB: 23-Mar-1941, 70 y.o.   MRN: [id number]  HPI 11/30/10-70 yo F never smoker, followed for bronchiectasis/ COPD, with hx treated atypical AFB/ MAIC, occasional hemoptysis and GERD.  Last here July 29, 2010 after normal flora on sputum cultures in October. Last antibiotic was augmentin in December. She questions if she should have had longer than 1 week treatment. Daily productive cough- yellow to light green. Shortness of breath with exertion across parking lot, up steps. . She does use Flutter occasionally. . No recent blood, fever, sweats or chest pain. Trial of Daliresp caused malaise.   03/01/11-70 yo F never smoker, followed for bronchiectasis/ COPD, with hx treated atypical AFB/ MAIC, occasional hemoptysis and GERD. No acute issues "just can't breathe and walk". She has avoided the weather extremes recently by staying in.  Cough always productive, greenish. Denies fever, sweat.  CXR- 11/30/10- reviewed with her- no definite new areas, but significant cystic bronchiectasis diffusely with some air fluid levels. PFT- 12/21/10- FEV1 1.02/ 49%; FEV1/FVC 0.54; no response to BD; TLC 0.77; DLCO 0.58    Severe obstructive disease, mild restriction.  She asks to defer formal oxygen assessment till she finishes spending summer at [facility] with her grandchildren. She has less cough in last few days, doesn't feel she could leave sputum specimen, but would like script for brand-name levaquin, which works better.    Review of Systems See HPI Constitutional:   No weight loss, Some night sweats, No- Fevers, chills, fatigue, lassitude. HEENT:   No headaches,  Difficulty swallowing,  Tooth/dental problems,  Sore throat,                No sneezing,  itching, ear ache, nasal congestion, post nasal drip,   CV:  No chest pain,  Orthopnea, PND, swelling in lower extremities, anasarca, dizziness, palpitations  GI  No heartburn, indigestion, abdominal pain, nausea, vomiting, diarrhea, change in bowel habits, loss of appetite  Resp:Shortness of breath with exertion. ,  No coughing up of blood. .  No wheezing.   Skin: no rash or lesions.  GU: no dysuria, change in color of urine, no urgency or frequency.  No flank pain.  MS:  No joint pain or swelling.  No decreased range of motion.  No back pain.  Psych:  No change in mood or affect. No depression or anxiety.  No memory loss.      Objective:   Physical Exam General- Alert, Oriented, Affect-appropriate, Distress- none acute  Skin- rash-none, lesions- none, excoriation- none  Lymphadenopathy- none  Head- atraumatic  Eyes- Gross vision intact, PERRLA, conjunctivae clear secretions  Ears- Hearing -Normal-, canals, Tm- normal  Nose- Clear,  No-Septal dev, mucus, polyps, erosion, perforation   Throat- Mallampati II , mucosa clear , drainage- none, tonsils- atrophic  Neck- flexible , trachea midline, no stridor , thyroid nl, carotid no bruit  Chest - symmetrical excursion , unlabored     Heart/CV- rapid, regular , no murmur , no gallop  , no rub, nl s1 s2                     -  JVD- none , edema- none, stasis changes- none, varices- none     Lung-, Quieter today with trace rhonchi, unlabored dullness-none, rub- none   Room air O2 sat 90%     Chest wall-  Abd- tender-no, distended-no, bowel sounds-present, HSM- no  Br/ Gen/ Rectal- Not done, not indicated  Extrem- cyanosis- none, clubbing, none, atrophy- none, strength- nl  Neuro- grossly intact to observation         Assessment & Plan:

## 2011-03-01 NOTE — Patient Instructions (Signed)
When you return we will consider culturing sputum and retesting oxygen levels.   Script for Levaquin to hold

## 2011-03-01 NOTE — Assessment & Plan Note (Signed)
Severe bronchiectasis. Quiet now. I will give script she requests for brand-name levaquin to hold. On return we will look at re-culturing sputum and getting ONOX.

## 2011-03-02 ENCOUNTER — Ambulatory Visit: Payer: Medicare Other | Admitting: Internal Medicine

## 2011-04-20 ENCOUNTER — Telehealth: Payer: Self-pay | Admitting: Internal Medicine

## 2011-04-20 NOTE — Telephone Encounter (Signed)
Pharmacy states this has already been taking care of by the pt

## 2011-05-03 ENCOUNTER — Ambulatory Visit: Payer: Medicare Other | Admitting: Internal Medicine

## 2011-05-04 ENCOUNTER — Encounter: Payer: Self-pay | Admitting: Internal Medicine

## 2011-05-05 ENCOUNTER — Ambulatory Visit (INDEPENDENT_AMBULATORY_CARE_PROVIDER_SITE_OTHER): Payer: Medicare Other | Admitting: Internal Medicine

## 2011-05-05 ENCOUNTER — Telehealth: Payer: Self-pay | Admitting: Internal Medicine

## 2011-05-05 ENCOUNTER — Encounter: Payer: Self-pay | Admitting: Internal Medicine

## 2011-05-05 VITALS — BP 120/60 | HR 109 | Ht 67.0 in | Wt 143.8 lb

## 2011-05-05 DIAGNOSIS — J479 Bronchiectasis, uncomplicated: Secondary | ICD-10-CM

## 2011-05-05 DIAGNOSIS — Z23 Encounter for immunization: Secondary | ICD-10-CM

## 2011-05-05 DIAGNOSIS — A310 Pulmonary mycobacterial infection: Secondary | ICD-10-CM

## 2011-05-05 DIAGNOSIS — K5289 Other specified noninfective gastroenteritis and colitis: Secondary | ICD-10-CM

## 2011-05-05 MED ORDER — AZITHROMYCIN 250 MG PO TABS: ORAL_TABLET | ORAL | Status: DC

## 2011-05-05 NOTE — Progress Notes (Signed)
Subjective:     HPI Review of Systems    Objective:   Physical Exam    Assessment & Plan:   Subjective:    Objective:   Assessment & Plan:   Subjective:    Patient ID: Stacy Novak, female    DOB: 06-14-1941, 70 y.o.   MRN: [id number]  HPI 11/30/10-69 yo F never smoker, followed for bronchiectasis/ COPD, with hx treated atypical AFB/ MAIC, occasional hemoptysis and GERD.  Last here July 29, 2010 after normal flora on sputum cultures in October. Last antibiotic was augmentin in December. She questions if she should have had longer than 1 week treatment. Daily productive cough- yellow to light green. Shortness of breath with exertion across parking lot, up steps. . She does use Flutter occasionally. . No recent blood, fever, sweats or chest pain. Trial of Daliresp caused malaise.   03/01/11-69 yo F never smoker, followed for bronchiectasis/ COPD, with hx treated atypical AFB/ MAIC, occasional hemoptysis and GERD. No acute issues "just can't breathe and walk". She has avoided the weather extremes recently by staying in.  Cough always productive, greenish. Denies fever, sweat.  CXR- 11/30/10- reviewed with her- no definite new areas, but significant cystic bronchiectasis diffusely with some air fluid levels. PFT- 12/21/10- FEV1 1.02/ 49%; FEV1/FVC 0.54; no response to BD; TLC 0.77; DLCO 0.58    Severe obstructive disease, mild restriction.  She asks to defer formal oxygen assessment till she finishes spending summer at [facility] with her grandchildren. She has less cough in last few days, doesn't feel she could leave sputum specimen, but would like script for brand-name levaquin, which works better.   05/05/11- 89 yo F never smoker, followed for bronchiectasis/ COPD, with hx treated atypical AFB/ MAIC, occasional hemoptysis and GERD. We gave (she requested brand-name) levaquin to hold last visit. She felt she probably needed it because of pleuritic right chest pain that lasted 2-3  days, self limited. She isn't sure there was any change in her daily productive cough- usually white. Maybe some fever once. She waited because she had upset GI with a little nausea and diarrhea. Still holding the levaquin.   Review of Systems See HPI Constitutional:   No weight loss, Some night sweats, No- Fevers, chills, fatigue, lassitude. HEENT:   No headaches,  Difficulty swallowing,  Tooth/dental problems,  Sore throat,                No sneezing, itching, ear ache, nasal congestion, post nasal drip,  CV:  No chest pain,  Orthopnea, PND, swelling in lower extremities, anasarca, dizziness, palpitations GI  No heartburn, indigestion, abdominal pain,    + nausea,no- vomiting, diarrhea, change in bowel habits,+loss of appetite Resp:Shortness of breath with exertion. ,  No coughing up of blood. .  No wheezing.  Skin: no rash or lesions. GU: no dysuria, change in color of urine, no urgency or frequency.  No flank pain. MS:  No joint pain or swelling.  No decreased range of motion.  No back pain. Psych:  No change in mood or affect. No depression or anxiety.  No memory loss.      Objective:   Physical Exam General- Alert, Oriented, Affect-appropriate, Distress- none acute Skin- rash-none, lesions- none, excoriation- none Lymphadenopathy- none Head- atraumatic            Eyes- Gross vision intact, PERRLA, conjunctivae clear secretions            Ears- Hearing, canals- normal  Nose- Clear, No-Septal dev, mucus, polyps, erosion, perforation             Throat- Mallampati II , mucosa clear , drainage- none, tonsils- atrophic Neck- flexible , trachea midline, no stridor , thyroid nl, carotid no bruit Chest - symmetrical excursion , unlabored           Heart/CV- RRR- rapid 109, no murmur , no gallop  , no rub, nl s1 s2                           - JVD- none , edema- none, stasis changes- none, varices- none           Lung- dry squeaks across upper back, wheeze- none, cough- mild ,  dullness-none, rub- none           Chest wall-  Abd- tender-no, distended-no, bowel sounds-present, HSM- no Br/ Gen/ Rectal- Not done, not indicated Extrem- cyanosis- none, clubbing, none, atrophy- none, strength- nl Neuro- grossly intact to observation          Assessment & Plan:

## 2011-05-05 NOTE — Assessment & Plan Note (Addendum)
She is not clearly different this visit. She will hold the levaquin for an acute flare. We discussed and will try maintenance azithromycin for suppression of her bronchitis.  I considered Daliresp trial, but the drug is too frequently associated with GI upset and I don't believe she will tolerate it. Flu vax

## 2011-05-05 NOTE — Patient Instructions (Signed)
Script to try daily maintenance zithromax to suppress bronchitis  Flu vax

## 2011-05-05 NOTE — Telephone Encounter (Signed)
I spoke with pharmacy and advised per CY note he wants the pt on azithromycin 250mg  daily, not Zpak. Carron Curie, CMA

## 2011-05-11 ENCOUNTER — Encounter: Payer: Self-pay | Admitting: Internal Medicine

## 2011-05-11 NOTE — Assessment & Plan Note (Signed)
Significant old fibrocystic scarring. This appears to be successfully arrested disease with no organisms on most recent culture.

## 2011-05-11 NOTE — Assessment & Plan Note (Signed)
She has tended towards easy nausea and anorexia with antibiotics over the years.

## 2011-07-06 ENCOUNTER — Encounter: Payer: Self-pay | Admitting: Internal Medicine

## 2011-07-06 ENCOUNTER — Ambulatory Visit (INDEPENDENT_AMBULATORY_CARE_PROVIDER_SITE_OTHER): Payer: Medicare Other | Admitting: Internal Medicine

## 2011-07-06 DIAGNOSIS — J479 Bronchiectasis, uncomplicated: Secondary | ICD-10-CM

## 2011-07-06 DIAGNOSIS — A310 Pulmonary mycobacterial infection: Secondary | ICD-10-CM

## 2011-07-06 NOTE — Progress Notes (Signed)
Patient ID: Stacy Novak, female    DOB: 1940-10-13, 70 y.o.   MRN: [id number]  HPI 11/30/10-69 yo F never smoker, followed for bronchiectasis/ COPD, with hx treated atypical AFB/ MAIC, occasional hemoptysis and GERD.  Last here July 29, 2010 after normal flora on sputum cultures in October. Last antibiotic was augmentin in December. She questions if she should have had longer than 1 week treatment. Daily productive cough- yellow to light green. Shortness of breath with exertion across parking lot, up steps. . She does use Flutter occasionally. . No recent blood, fever, sweats or chest pain. Trial of Daliresp caused malaise.   03/01/11-69 yo F never smoker, followed for bronchiectasis/ COPD, with hx treated atypical AFB/ MAIC, occasional hemoptysis and GERD. No acute issues "just can't breathe and walk". She has avoided the weather extremes recently by staying in.  Cough always productive, greenish. Denies fever, sweat.  CXR- 11/30/10- reviewed with her- no definite new areas, but significant cystic bronchiectasis diffusely with some air fluid levels. PFT- 12/21/10- FEV1 1.02/ 49%; FEV1/FVC 0.54; no response to BD; TLC 0.77; DLCO 0.58    Severe obstructive disease, mild restriction.  She asks to defer formal oxygen assessment till she finishes spending summer at [facility] with her grandchildren. She has less cough in last few days, doesn't feel she could leave sputum specimen, but would like script for brand-name levaquin, which works better.   05/05/11- 7 yo F never smoker, followed for bronchiectasis/ COPD, with hx treated atypical AFB/ MAIC, occasional hemoptysis and GERD. We gave (she requested brand-name) levaquin to hold last visit. She felt she probably needed it because of pleuritic right chest pain that lasted 2-3 days, self limited. She isn't sure there was any change in her daily productive cough- usually white. Maybe some fever once. She waited because she had upset GI with a  little nausea and diarrhea. Still holding the levaquin.   07/06/11-  54 yo F never smoker, followed for bronchiectasis/ COPD, with hx treated atypical AFB/ MAIC, occasional hemoptysis and GERD. Has had flu vaccine. Has not taken the Levaquin prescription we gave previously. She was taking maintenance Zithromax to suppress bronchitis. She stayed off it for 2 weeks because of GI upset with cramps, after successfully taking it for 6 weeks. She has no GI discomfort now. She is going to try resuming maintenance Zithromax, but adding a probiotic. She did not tolerate Daliresp.  Review of Systems See HPI Constitutional:   No weight loss, Some night sweats, No- Fevers, chills, fatigue, lassitude. HEENT:   No headaches,  Difficulty swallowing,  Tooth/dental problems,  Sore throat,                No sneezing, itching, ear ache, nasal congestion, post nasal drip,  CV:  No chest pain,  Orthopnea, PND, swelling in lower extremities, anasarca, dizziness, palpitations GI  No heartburn, indigestion, abdominal pain,    + nausea,no- vomiting, diarrhea, change in bowel habits,+loss of appetite Resp:Shortness of breath with exertion. ,  No coughing up of blood. .  No wheezing.  Skin: no rash or lesions. GU: no dysuria, change in color of urine, no urgency or frequency.  No flank pain. MS:  No joint pain or swelling.  No decreased range of motion.  No back pain. Psych:  No change in mood or affect. No depression or anxiety.  No memory loss.      Objective:   Physical Exam General- Alert, Oriented, Affect-appropriate, Distress- none acute  Skin- rash-none, lesions- none, excoriation- none Lymphadenopathy- none Head- atraumatic            Eyes- Gross vision intact, PERRLA, conjunctivae clear secretions            Ears- Hearing, canals- normal            Nose- Clear, No-Septal dev, mucus, polyps, erosion, perforation             Throat- Mallampati II , mucosa clear , drainage- none, tonsils- atrophic Neck-  flexible , trachea midline, no stridor , thyroid nl, carotid no bruit Chest - symmetrical excursion , unlabored           Heart/CV- RRR- with occasional skipped beat., no murmur , no gallop  , no rub, nl s1 s2                           - JVD- none , edema- none, stasis changes- none, varices- none           Lung- coarse crackles in both upper lobes., wheeze- none, cough- mild , dullness-none, rub- none           Chest wall-  Abd- tender-no, distended-no, bowel sounds-present, HSM- no Br/ Gen/ Rectal- Not done, not indicated Extrem- cyanosis- none, clubbing, none, atrophy- none, strength- nl Neuro- grossly intact to observation

## 2011-07-06 NOTE — Patient Instructions (Addendum)
Ok to try resuming your maintenance suppression antibiotic regimen with azithromycin. It may help to add a probiotic- otc at drug store.   If you can't tolerate it daily, maybe try taking it for one week out of each month.

## 2011-07-09 NOTE — Assessment & Plan Note (Signed)
Clinically stable at this visit. We're going to try to maintain suppression with Zithromax if she can tolerate it. We may need to try having her take it one week out of each month. We are adding a probiotic.

## 2011-07-09 NOTE — Assessment & Plan Note (Signed)
Significant scarring but no definite progression, no relapse.

## 2011-11-07 ENCOUNTER — Encounter: Payer: Self-pay | Admitting: Internal Medicine

## 2011-11-07 ENCOUNTER — Ambulatory Visit (INDEPENDENT_AMBULATORY_CARE_PROVIDER_SITE_OTHER): Payer: Medicare Other | Admitting: Internal Medicine

## 2011-11-07 VITALS — BP 122/58 | HR 128 | Ht 67.0 in | Wt 141.6 lb

## 2011-11-07 DIAGNOSIS — J479 Bronchiectasis, uncomplicated: Secondary | ICD-10-CM

## 2011-11-07 DIAGNOSIS — A310 Pulmonary mycobacterial infection: Secondary | ICD-10-CM

## 2011-11-07 MED ORDER — TRAMADOL HCL 50 MG PO TABS: 50.0000 mg | ORAL_TABLET | Freq: Three times a day (TID) | ORAL | Status: DC | PRN

## 2011-11-07 MED ORDER — BENZONATATE 100 MG PO CAPS: 200.0000 mg | ORAL_CAPSULE | Freq: Three times a day (TID) | ORAL | Status: DC | PRN

## 2011-11-07 NOTE — Patient Instructions (Signed)
We are stopping the zithromax for now.  Please call as needed

## 2011-11-07 NOTE — Progress Notes (Signed)
Patient ID: Stacy Novak, female    DOB: 1940/10/31, 71 y.o.   MRN: [id number]  HPI 11/30/10-69 yo F never smoker, followed for bronchiectasis/ COPD, with hx treated atypical AFB/ MAIC, occasional hemoptysis and GERD.  Last here July 29, 2010 after normal flora on sputum cultures in October. Last antibiotic was augmentin in December. She questions if she should have had longer than 1 week treatment. Daily productive cough- yellow to light green. Shortness of breath with exertion across parking lot, up steps. . She does use Flutter occasionally. . No recent blood, fever, sweats or chest pain. Trial of Daliresp caused malaise.   03/01/11-69 yo F never smoker, followed for bronchiectasis/ COPD, with hx treated atypical AFB/ MAIC, occasional hemoptysis and GERD. No acute issues "just can't breathe and walk". She has avoided the weather extremes recently by staying in.  Cough always productive, greenish. Denies fever, sweat.  CXR- 11/30/10- reviewed with her- no definite new areas, but significant cystic bronchiectasis diffusely with some air fluid levels. PFT- 12/21/10- FEV1 1.02/ 49%; FEV1/FVC 0.54; no response to BD; TLC 0.77; DLCO 0.58    Severe obstructive disease, mild restriction.  She asks to defer formal oxygen assessment till she finishes spending summer at [facility] with her grandchildren. She has less cough in last few days, doesn't feel she could leave sputum specimen, but would like script for brand-name levaquin, which works better.   05/05/11- 10 yo F never smoker, followed for bronchiectasis/ COPD, with hx treated atypical AFB/ MAIC, occasional hemoptysis and GERD. We gave (she requested brand-name) levaquin to hold last visit. She felt she probably needed it because of pleuritic right chest pain that lasted 2-3 days, self limited. She isn't sure there was any change in her daily productive cough- usually white. Maybe some fever once. She waited because she had upset GI with a  little nausea and diarrhea. Still holding the levaquin.   07/06/11-  32 yo F never smoker, followed for bronchiectasis/ COPD, with hx treated atypical AFB/ MAIC, occasional hemoptysis and GERD. Has had flu vaccine. Has not taken the Levaquin prescription we gave previously. She was taking maintenance Zithromax to suppress bronchitis. She stayed off it for 2 weeks because of GI upset with cramps, after successfully taking it for 6 weeks. She has no GI discomfort now. She is going to try resuming maintenance Zithromax, but adding a probiotic. She did not tolerate Daliresp.  11/07/11-  61 yo F never smoker, followed for bronchiectasis/ COPD, with hx treated atypical AFB/ MAIC, occasional hemoptysis and GERD. Still coughing a lot, often productive, with looser mucus. Had tried maintenance Zithromax until she got GI upset. Remains on chronic Nexium for GERD. Discussed her recent reports of cardiac conduction problems with macrolide antibiotics. (REC- next visit- look at HR, chemistry)  Review of Systems-See HPI Constitutional:   No weight loss, Some night sweats, No- Fevers, chills, fatigue, lassitude. HEENT:   No headaches,  Difficulty swallowing,  Tooth/dental problems,  Sore throat,                No sneezing, itching, ear ache, nasal congestion, post nasal drip,  CV:  No chest pain, orthopnea, PND, swelling in lower extremities, anasarca, dizziness, palpitations GI  No heartburn, indigestion, abdominal pain, nausea, vomiting, Resp: Shortness of breath with exertion. ,  No coughing up of blood. .  No wheezing.  Skin: no rash or lesions. GU: no dysuria,  MS:  No joint pain or swelling.  No decreased  range of motion.  No back pain. Psych:  No change in mood or affect. No depression or anxiety.  No memory loss.  Objective:   Physical Exam General- Alert, Oriented, Affect-appropriate, Distress- none acute; RA O2 sat 93%, pulse 120/min Skin- rash-none, lesions- none, excoriation-  none Lymphadenopathy- none Head- atraumatic            Eyes- Gross vision intact, PERRLA, conjunctivae clear secretions            Ears- Hearing, canals- normal            Nose- Clear, No-Septal dev, mucus, polyps, erosion, perforation             Throat- Mallampati II , mucosa clear , drainage- none, tonsils- atrophic Neck- flexible , trachea midline, no stridor , thyroid nl, carotid no bruit Chest - symmetrical excursion , unlabored           Heart/CV- rapid RRR- with occasional skipped beat., no murmur , no gallop  , no rub, nl s1 s2                           - JVD- none , edema- none, stasis changes- none, varices- none           Lung- wheeze left greater than right side, upper zones with few rhonchi, unlabored, cough- mild , dullness-none, rub- none           Chest wall-  Abd- tender-no, distended-no, bowel sounds-present, HSM- no Br/ Gen/ Rectal- Not done, not indicated Extrem- cyanosis- none, clubbing, none, atrophy- none, strength- nl Neuro- grossly intact to observation

## 2011-11-11 NOTE — Assessment & Plan Note (Signed)
No documented recurrence 

## 2011-11-11 NOTE — Assessment & Plan Note (Signed)
We reviewed her status again. Situation appears stable. Can't exclude some areas of infection/inflammation. Will want to stop antibiotics including maintenance Zithromax and reassess. I discussed significance of her tachycardia which was noted after she walked from exam room.

## 2011-12-30 ENCOUNTER — Other Ambulatory Visit: Payer: Self-pay | Admitting: Internal Medicine

## 2012-01-18 ENCOUNTER — Telehealth: Payer: Self-pay | Admitting: Pulmonary Disease

## 2012-01-18 ENCOUNTER — Telehealth: Payer: Self-pay | Admitting: Internal Medicine

## 2012-01-18 NOTE — Telephone Encounter (Signed)
Scheduled pt for 9a tomorrow.Stacy Novak

## 2012-01-18 NOTE — Telephone Encounter (Signed)
Spoke with pt. She states that she just coughed up BRB- about a quarter size in diameter. She states that she is slightly more SOB than usual for her, but denies any CP, fever, wheeze or other co's. She is scheduled to see CDY tomorrow at 9 am. I advised that she should keep this appt, but is she starts bringing up more blood, SOB gets worse or new symptoms develop, she needs to seek emergent care immediately. Pt verbalized understanding of this.

## 2012-01-18 NOTE — Telephone Encounter (Signed)
Error.Stacy Novak ° °

## 2012-01-19 ENCOUNTER — Encounter: Payer: Self-pay | Admitting: Internal Medicine

## 2012-01-19 ENCOUNTER — Other Ambulatory Visit (INDEPENDENT_AMBULATORY_CARE_PROVIDER_SITE_OTHER): Payer: Medicare Other

## 2012-01-19 ENCOUNTER — Ambulatory Visit (INDEPENDENT_AMBULATORY_CARE_PROVIDER_SITE_OTHER)
Admission: RE | Admit: 2012-01-19 | Discharge: 2012-01-19 | Disposition: A | Discharge status: Home / Self Care | Payer: Medicare Other | Source: Ambulatory Visit | Attending: Internal Medicine | Admitting: Internal Medicine

## 2012-01-19 ENCOUNTER — Ambulatory Visit (INDEPENDENT_AMBULATORY_CARE_PROVIDER_SITE_OTHER): Payer: Medicare Other | Admitting: Internal Medicine

## 2012-01-19 VITALS — BP 116/70 | HR 122 | Ht 67.0 in | Wt 140.4 lb

## 2012-01-19 DIAGNOSIS — R042 Hemoptysis: Secondary | ICD-10-CM

## 2012-01-19 DIAGNOSIS — J479 Bronchiectasis, uncomplicated: Secondary | ICD-10-CM

## 2012-01-19 DIAGNOSIS — J471 Bronchiectasis with (acute) exacerbation: Secondary | ICD-10-CM

## 2012-01-19 LAB — CBC WITH DIFFERENTIAL/PLATELET
Basophils Absolute: 0 10*3/uL (ref 0.0–0.1)
Basophils Relative: 0.5 % (ref 0.0–3.0)
HCT: 38.6 % (ref 36.0–46.0)
Hemoglobin: 12 g/dL (ref 12.0–15.0)
Lymphs Abs: 1.4 10*3/uL (ref 0.7–4.0)
MCHC: 31 g/dL (ref 30.0–36.0)
Monocytes Relative: 6 % (ref 3.0–12.0)
Neutro Abs: 6.4 10*3/uL (ref 1.4–7.7)
RBC: 5.06 Mil/uL (ref 3.87–5.11)
RDW: 15.3 % — ABNORMAL HIGH (ref 11.5–14.6)

## 2012-01-19 MED ORDER — HYDROCODONE-HOMATROPINE 5-1.5 MG/5ML PO SYRP: 5.0000 mL | ORAL_SOLUTION | Freq: Four times a day (QID) | ORAL | Status: AC | PRN

## 2012-01-19 MED ORDER — PROMETHAZINE HCL 12.5 MG RE SUPP: 12.5000 mg | Freq: Four times a day (QID) | RECTAL | Status: DC | PRN

## 2012-01-19 MED ORDER — LEVOFLOXACIN 500 MG PO TABS: 500.0000 mg | ORAL_TABLET | Freq: Every day | ORAL | Status: AC

## 2012-01-19 NOTE — Patient Instructions (Addendum)
Scripts for hydromet for cough and for phenergan suppositories  Take your Levaquin, 1 daily x 7 days  Order- CXR-   Dx bronchiectasis, hemoptysis  Order- lab- CBC w/ diff, PT, PTT     Dx hemoptysis

## 2012-01-19 NOTE — Progress Notes (Signed)
Patient ID: Stacy Novak, female    DOB: [birth year], 71 y.o.   MRN: [id number]  HPI 11/30/10-69 yo F never smoker, followed for bronchiectasis/ COPD, with hx treated atypical AFB/ MAIC, occasional hemoptysis and GERD.  Last here July 29, 2010 after normal flora on sputum cultures in October. Last antibiotic was augmentin in December. She questions if she should have had longer than 1 week treatment. Daily productive cough- yellow to light green. Shortness of breath with exertion across parking lot, up steps. . She does use Flutter occasionally. . No recent blood, fever, sweats or chest pain. Trial of Daliresp caused malaise.   03/01/11-69 yo F never smoker, followed for bronchiectasis/ COPD, with hx treated atypical AFB/ MAIC, occasional hemoptysis and GERD. No acute issues "just can't breathe and walk". She has avoided the weather extremes recently by staying in.  Cough always productive, greenish. Denies fever, sweat.  CXR- 11/30/10- reviewed with her- no definite new areas, but significant cystic bronchiectasis diffusely with some air fluid levels. PFT- 12/21/10- FEV1 1.02/ 49%; FEV1/FVC 0.54; no response to BD; TLC 0.77; DLCO 0.58    Severe obstructive disease, mild restriction.  She asks to defer formal oxygen assessment till she finishes spending summer at [facility] with her grandchildren. She has less cough in last few days, doesn't feel she could leave sputum specimen, but would like script for brand-name levaquin, which works better.   05/05/11- 89 yo F never smoker, followed for bronchiectasis/ COPD, with hx treated atypical AFB/ MAIC, occasional hemoptysis and GERD. We gave (she requested brand-name) levaquin to hold last visit. She felt she probably needed it because of pleuritic right chest pain that lasted 2-3 days, self limited. She isn't sure there was any change in her daily productive cough- usually white. Maybe some fever once. She waited because she had upset GI with a  little nausea and diarrhea. Still holding the levaquin.   07/06/11-  50 yo F never smoker, followed for bronchiectasis/ COPD, with hx treated atypical AFB/ MAIC, occasional hemoptysis and GERD. Has had flu vaccine. Has not taken the Levaquin prescription we gave previously. She was taking maintenance Zithromax to suppress bronchitis. She stayed off it for 2 weeks because of GI upset with cramps, after successfully taking it for 6 weeks. She has no GI discomfort now. She is going to try resuming maintenance Zithromax, but adding a probiotic. She did not tolerate Daliresp.  11/07/11-  78 yo F never smoker, followed for bronchiectasis/ COPD, with hx treated atypical AFB/ MAIC, occasional hemoptysis and GERD. Still coughing a lot, often productive, with looser mucus. Had tried maintenance Zithromax until she got GI upset. Remains on chronic Nexium for GERD. Discussed her recent reports of cardiac conduction problems with macrolide antibiotics. (REC- next visit- look at HR, chemistry)  01/19/12- 35 yo F never smoker, followed for bronchiectasis/ COPD, with hx treated atypical AFB/ MAIC, occasional hemoptysis and GERD. Coughing up blood-bright red; would happen off and on, but only on May 30 with no repeat today , . SOB and wheezing has increased. Always has some cough which has not changed. No easy bruising or other bleeding. No fever. Previous hemoptysis 2 or 3 years ago at which time we stopped the baby aspirin.  Review of Systems-See HPI Constitutional:   No weight loss, Some night sweats, No- Fevers, chills, fatigue, lassitude. HEENT:   No headaches,  Difficulty swallowing,  Tooth/dental problems,  Sore throat,  No sneezing, itching, ear ache, nasal congestion, post nasal drip,  CV:  No chest pain, orthopnea, PND, swelling in lower extremities, anasarca, dizziness, palpitations GI  No heartburn, indigestion, abdominal pain, nausea, vomiting, Resp: +Shortness of breath with exertion. ,   + coughing up of blood. .  + wheezing.  Skin: no rash or lesions. GU:   MS:  No joint pain or swelling.   Psych:  No change in mood or affect. No depression or anxiety.  No memory loss.  Objective:   Physical Exam General- Alert, Oriented, Affect-appropriate, Distress- none acute;  Skin- rash-none, lesions- none, excoriation- none Lymphadenopathy- none Head- atraumatic            Eyes- Gross vision intact, PERRLA, conjunctivae clear secretions            Ears- Hearing, canals- normal            Nose- Clear, No-Septal dev, mucus, polyps, erosion, perforation             Throat- Mallampati II , mucosa clear , drainage- none, tonsils- atrophic Neck- flexible , trachea midline, no stridor , thyroid nl, carotid no bruit Chest - symmetrical excursion , unlabored           Heart/CV- rapid RRR- with occasional skipped beat., no murmur , no gallop  , no rub, nl s1 s2                           - JVD- none , edema- none, stasis changes- none, varices- none           Lung- bilateral mild rhonchi, unlabored, cough- mild , dullness-none, rub- none           Chest wall-  Abd-  Br/ Gen/ Rectal- Not done, not indicated Extrem- cyanosis- none, clubbing, none, atrophy- none, strength- nl Neuro- grossly intact to observation

## 2012-01-23 ENCOUNTER — Telehealth: Payer: Self-pay | Admitting: Internal Medicine

## 2012-01-23 NOTE — Telephone Encounter (Signed)
I spoke with pt and is aware of CDY recs. She voiced her understanding and had no questions 

## 2012-01-23 NOTE — Telephone Encounter (Signed)
Levaquin might possibly cause some slowing of electrical conduction, and a slow heart rate. I don't don't know why it would be the explanation for the problem described. i would try to finish it the rest of the prescription, if possible.

## 2012-01-23 NOTE — Telephone Encounter (Signed)
Notes Recorded by Waymon Budge, MD on 01/19/2012 at 5:01 PM CXR still shows considerable scarring with small areas of pooling of liquid secretion as before. Nothing looks new.  I spoke with patient about results and she verbalized understanding and had no questions.  Pt states she started taking the Levaquin on Friday but yesterday she noticed her heart would race then slow down to were she could barely feel her pulse. Pt wants to know if this could be due to the levaquin bc she has not started anything else new. Please advise Dr. Maple Hudson, thanks

## 2012-01-26 NOTE — Assessment & Plan Note (Signed)
Plan-chest x-ray, Levaquin. She asks Phenergan suppository to help with nausea while on antibiotics.

## 2012-01-26 NOTE — Assessment & Plan Note (Signed)
Probably benign hemorrhagic hemoptysis related to a local exacerbation of her bronchiectasis. Plan-cough suppression, CBC with differential, pro time and PTT, chest x-ray, Levaquin.

## 2012-03-06 ENCOUNTER — Telehealth: Payer: Self-pay | Admitting: Internal Medicine

## 2012-03-06 NOTE — Telephone Encounter (Signed)
Sorry about this; I have printed the results and placed on CY's cart to review Monday and advise.

## 2012-03-06 NOTE — Telephone Encounter (Signed)
Pt is calling for lab results from 01/19/12. She says she never got a call about these. Pt aware CDY will be out of the office until Mon., 03/11/12 and will have him look at the labs and give her a call back once he has reviewed. Pt verbalized understanding and was fine with this.

## 2012-03-12 NOTE — Telephone Encounter (Signed)
Sorry for the delay. Her CBC and clotting study results were normal

## 2012-03-12 NOTE — Telephone Encounter (Signed)
I spoke with patient about results and she verbalized understanding and had no questions 

## 2012-03-19 ENCOUNTER — Ambulatory Visit (INDEPENDENT_AMBULATORY_CARE_PROVIDER_SITE_OTHER): Payer: Medicare Other | Admitting: Internal Medicine

## 2012-03-19 ENCOUNTER — Encounter: Payer: Self-pay | Admitting: Internal Medicine

## 2012-03-19 ENCOUNTER — Ambulatory Visit (INDEPENDENT_AMBULATORY_CARE_PROVIDER_SITE_OTHER)
Admission: RE | Admit: 2012-03-19 | Discharge: 2012-03-19 | Disposition: A | Discharge status: Home / Self Care | Payer: Medicare Other | Source: Ambulatory Visit | Attending: Internal Medicine | Admitting: Internal Medicine

## 2012-03-19 ENCOUNTER — Telehealth: Payer: Self-pay | Admitting: Internal Medicine

## 2012-03-19 ENCOUNTER — Other Ambulatory Visit (INDEPENDENT_AMBULATORY_CARE_PROVIDER_SITE_OTHER): Payer: Medicare Other

## 2012-03-19 VITALS — BP 114/66 | HR 122 | Ht 67.0 in | Wt 130.2 lb

## 2012-03-19 DIAGNOSIS — J471 Bronchiectasis with (acute) exacerbation: Secondary | ICD-10-CM

## 2012-03-19 DIAGNOSIS — E0590 Thyrotoxicosis, unspecified without thyrotoxic crisis or storm: Secondary | ICD-10-CM

## 2012-03-19 DIAGNOSIS — D649 Anemia, unspecified: Secondary | ICD-10-CM

## 2012-03-19 DIAGNOSIS — J479 Bronchiectasis, uncomplicated: Secondary | ICD-10-CM

## 2012-03-19 DIAGNOSIS — D510 Vitamin B12 deficiency anemia due to intrinsic factor deficiency: Secondary | ICD-10-CM

## 2012-03-19 DIAGNOSIS — K5289 Other specified noninfective gastroenteritis and colitis: Secondary | ICD-10-CM

## 2012-03-19 DIAGNOSIS — R634 Abnormal weight loss: Secondary | ICD-10-CM

## 2012-03-19 LAB — T4, FREE: Free T4: 1.11 ng/dL (ref 0.60–1.60)

## 2012-03-19 LAB — COMPREHENSIVE METABOLIC PANEL
ALT: 9 U/L (ref 0–35)
AST: 22 U/L (ref 0–37)
Alkaline Phosphatase: 76 U/L (ref 39–117)
BUN: 16 mg/dL (ref 6–23)
Chloride: 98 mEq/L (ref 96–112)
Creatinine, Ser: 0.7 mg/dL (ref 0.4–1.2)
Total Bilirubin: 0.5 mg/dL (ref 0.3–1.2)

## 2012-03-19 LAB — TSH: TSH: 1.29 u[IU]/mL (ref 0.35–5.50)

## 2012-03-19 LAB — CBC WITH DIFFERENTIAL/PLATELET
Basophils Absolute: 0 10*3/uL (ref 0.0–0.1)
Eosinophils Absolute: 0 10*3/uL (ref 0.0–0.7)
HCT: 39.4 % (ref 36.0–46.0)
Lymphs Abs: 0.8 10*3/uL (ref 0.7–4.0)
MCHC: 31 g/dL (ref 30.0–36.0)
Monocytes Absolute: 0.4 10*3/uL (ref 0.1–1.0)
Monocytes Relative: 5.4 % (ref 3.0–12.0)
Platelets: 309 10*3/uL (ref 150.0–400.0)
RDW: 15.5 % — ABNORMAL HIGH (ref 11.5–14.6)

## 2012-03-19 LAB — SEDIMENTATION RATE: Sed Rate: 78 mm/hr — ABNORMAL HIGH (ref 0–22)

## 2012-03-19 LAB — PREALBUMIN: Prealbumin: 15.6 mg/dL — ABNORMAL LOW (ref 17.0–34.0)

## 2012-03-19 MED ORDER — CYANOCOBALAMIN 1000 MCG/ML IJ SOLN
1000.0000 ug | Freq: Once | INTRAMUSCULAR | Status: AC
  Administered 2012-03-19: 1000 ug via INTRAMUSCULAR

## 2012-03-19 NOTE — Patient Instructions (Addendum)
Order- lab CBC w/ diff, sedimentation rate,  CMET, Hyperthryoid panel, Vit B12 and Folate levels, prealbumin    Dx hyperthyroid, weight loss, anemia                   Sputum for routine C&S, AFB and Fungal smears and cultures             CXR    Bronchiectasis   After labs drawn, can come back up for B12 injection  Get Boost or Ensure and drink at least one daily as a supplement  Please call Dr [name] for early appointment about your weakness and weight loss.

## 2012-03-19 NOTE — Progress Notes (Signed)
Patient ID: Stacy Novak, female    DOB: 04/23/1941, 71 y.o.   MRN: [id number]  HPI 11/30/10-71 yo F never smoker, followed for bronchiectasis/ COPD, with hx treated atypical AFB/ MAIC, occasional hemoptysis and GERD.  Last here July 29, 2010 after normal flora on sputum cultures in October. Last antibiotic was augmentin in December. She questions if she should have had longer than 1 week treatment. Daily productive cough- yellow to light green. Shortness of breath with exertion across parking lot, up steps. . She does use Flutter occasionally. . No recent blood, fever, sweats or chest pain. Trial of Daliresp caused malaise.   03/01/11-71 yo F never smoker, followed for bronchiectasis/ COPD, with hx treated atypical AFB/ MAIC, occasional hemoptysis and GERD. No acute issues "just can't breathe and walk". She has avoided the weather extremes recently by staying in.  Cough always productive, greenish. Denies fever, sweat.  CXR- 11/30/10- reviewed with her- no definite new areas, but significant cystic bronchiectasis diffusely with some air fluid levels. PFT- 12/21/10- FEV1 1.02/ 49%; FEV1/FVC 0.54; no response to BD; TLC 0.77; DLCO 0.58    Severe obstructive disease, mild restriction.  6MWT She asks to defer formal oxygen assessment till [facility] [place]. She has less cough in last few days, doesn't feel she could leave sputum specimen, but would like script for brand-name levaquin, which works better.   05/05/11- 71 yo F never smoker, followed for bronchiectasis/ COPD, with hx treated atypical AFB/ MAIC, occasional hemoptysis and GERD. We gave (she requested brand-name) levaquin to hold last visit. She felt she probably needed it because of pleuritic right chest pain that lasted 2-3 days, self limited. She isn't sure there was any change in her daily productive cough- usually white. Maybe some fever once. She waited because she had upset GI with a  little nausea and diarrhea. Still holding the levaquin.   07/06/11-  71 yo F never smoker, followed for bronchiectasis/ COPD, with hx treated atypical AFB/ MAIC, occasional hemoptysis and GERD. Has had flu vaccine. Has not taken the Levaquin prescription we gave previously. She was taking maintenance Zithromax to suppress bronchitis. She stayed off it for 2 weeks because of GI upset with cramps, after successfully taking it for 6 weeks. She has no GI discomfort now. She is going to try resuming maintenance Zithromax, but adding a probiotic. She did not tolerate Daliresp.  11/07/11-  71 yo F never smoker, followed for bronchiectasis/ COPD, with hx treated atypical AFB/ MAIC, occasional hemoptysis and GERD. Still coughing a lot, often productive, with looser mucus. Had tried maintenance Zithromax until she got GI upset. Remains on chronic Nexium for GERD. Discussed her recent reports of cardiac conduction problems with macrolide antibiotics. (REC- next visit- look at HR, chemistry)  01/19/12- 71 yo F never smoker, followed for bronchiectasis/ COPD, with hx treated atypical AFB/ MAIC, occasional hemoptysis and GERD. Coughing up blood-bright red; would happen off and on, but only on May 30 with no repeat today , . SOB and wheezing has increased. Always has some cough which has not changed. No easy bruising or other bleeding. No fever. Previous hemoptysis 2 or 3 years ago at which time we stopped the baby aspirin.  03/19/12-  71 yo F never smoker, followed for bronchiectasis/ COPD, with hx treated atypical AFB/ MAIC, occasional hemoptysis and GERD. Having increased SOB past 3 weeks; losing weight and unsure why; no energy.. Has lost 10 lbs since last visit.  140>130lbs. COPD assessment test (CAT) score 32/40. She complains of weakness, aching joints, weight loss. 2 episodes where she passed mucus per rectum-not blood. Dry eyes, nose and mouth times one month. Bloated with eating-takes her breath. Burning  she-asks B12 shot. Jittery. Has slept up in a chair for years because of chronic midthoracic back pain has not gone back to see Dr. [name] in a long time. She asks if all of this is a side effect of Nexium. We reviewed her radiology again. CXR 01/23/12-  IMPRESSION:  Stable chronic changes with scattered air-fluid levels, as  described above.  Original Report Authenticated By: [name], M.D.   Review of Systems-See HPI Constitutional:   + weight loss, No-night sweats, No- Fevers, chills, +fatigue, lassitude. HEENT:   No headaches,  Difficulty swallowing,  Tooth/dental problems,  Sore throat,                No sneezing, itching, ear ache, nasal congestion, post nasal drip,  CV:  No chest pain, orthopnea, PND, swelling in lower extremities, anasarca, +dizziness, No-palpitations GI  No heartburn, indigestion, abdominal pain, nausea, vomiting, Resp: +Shortness of breath with exertion. ,  No- coughing up of blood. .  some wheezing.  Skin: no rash or lesions. GU:   MS:  + joint pain or swelling.   Psych:  No change in mood or affect. No depression + anxiety.  No memory loss.  Objective:   Physical Exam General- Alert, Oriented, Affect-appropriate, Distress- none acute; thin. Looks chronically ill. Skin- rash-none, lesions- none, excoriation- none Lymphadenopathy- none Head- atraumatic            Eyes- Gross vision intact, PERRLA, conjunctivae clear secretions            Ears- Hearing, canals- normal            Nose- Clear, No-Septal dev, mucus, polyps, erosion, perforation             Throat- Mallampati II , mucosa clear , drainage- none, tonsils- atrophic Neck- flexible , trachea midline, no stridor , thyroid nl, carotid no bruit Chest - symmetrical excursion , unlabored           Heart/CV- rapid RRR ., no murmur , no gallop  , no rub, nl s1 s2                           - JVD- none , edema- none, stasis changes- none, varices- none           Lung- bilateral  rhonchi, unlabored, cough-  mild , dullness-none, rub- none           Chest wall-  Abd-  Br/ Gen/ Rectal- Not done, not indicated Extrem- cyanosis- none, clubbing, none, atrophy- none, strength- nl Neuro- grossly intact to observation

## 2012-03-19 NOTE — Assessment & Plan Note (Signed)
She describes 2 episodes of noticeable mucus per rectum-question inflammatory bowel disease?

## 2012-03-19 NOTE — Telephone Encounter (Signed)
Order was sputum culture never ordered and so was not done Called and spoke with pt and notified that I have now placed the order and she can come in at her convenience and have this done. I apologized for the inconvenience and she states nothing further needed.

## 2012-03-19 NOTE — Assessment & Plan Note (Signed)
Important weight loss, persistent regular tachycardia, weakness or joint pains and neuropathic burning paresthesias in her feet. Question if this is all manifestation of her chronic pulmonary disease which does not seem to have progressed, or does she an additional disease? She is focused on getting a B12 shot today. We will let her have that after sending her down for broad labs including check of thyroid function B12 and folate levels. Update chest x-ray and reculture sputum. Last sputum cultures did not show recurrence of MAIC. I emphasized to her that she really needs a review of her overall health with her primary physician. I left for him to get an EKG-clinically this is an SVT. I can get labs today to help with the assessment and will forward them to him when available. Repeat cultures are important and may guide decision toward intravenous antibiotic therapy. Significantly, she is not describing purulent discharge, fever, chills or sweats of any significance now.

## 2012-03-20 ENCOUNTER — Other Ambulatory Visit: Payer: Medicare Other

## 2012-03-20 DIAGNOSIS — J479 Bronchiectasis, uncomplicated: Secondary | ICD-10-CM

## 2012-03-20 LAB — FOLATE: Folate: >24.8 ng/mL (ref >5.9)

## 2012-03-20 NOTE — Progress Notes (Signed)
Quick Note:  Pt aware of results. ______ 

## 2012-03-21 ENCOUNTER — Inpatient Hospital Stay (HOSPITAL_COMMUNITY)
Admission: AD | Admit: 2012-03-21 | Discharge: 2012-03-27 | DRG: 192 | Disposition: A | Discharge status: Home / Self Care | Payer: Medicare Other | Source: Ambulatory Visit | Attending: Pulmonary Disease | Admitting: Pulmonary Disease

## 2012-03-21 ENCOUNTER — Encounter (HOSPITAL_COMMUNITY): Payer: Self-pay | Admitting: General Practice

## 2012-03-21 ENCOUNTER — Telehealth: Payer: Self-pay | Admitting: Internal Medicine

## 2012-03-21 DIAGNOSIS — A310 Pulmonary mycobacterial infection: Secondary | ICD-10-CM

## 2012-03-21 DIAGNOSIS — E119 Type 2 diabetes mellitus without complications: Secondary | ICD-10-CM | POA: Diagnosis present

## 2012-03-21 DIAGNOSIS — J479 Bronchiectasis, uncomplicated: Secondary | ICD-10-CM | POA: Diagnosis present

## 2012-03-21 DIAGNOSIS — J471 Bronchiectasis with (acute) exacerbation: Principal | ICD-10-CM

## 2012-03-21 DIAGNOSIS — K5289 Other specified noninfective gastroenteritis and colitis: Secondary | ICD-10-CM

## 2012-03-21 DIAGNOSIS — R042 Hemoptysis: Secondary | ICD-10-CM

## 2012-03-21 DIAGNOSIS — Z79899 Other long term (current) drug therapy: Secondary | ICD-10-CM

## 2012-03-21 DIAGNOSIS — D510 Vitamin B12 deficiency anemia due to intrinsic factor deficiency: Secondary | ICD-10-CM | POA: Diagnosis present

## 2012-03-21 DIAGNOSIS — J189 Pneumonia, unspecified organism: Secondary | ICD-10-CM

## 2012-03-21 DIAGNOSIS — K219 Gastro-esophageal reflux disease without esophagitis: Secondary | ICD-10-CM

## 2012-03-21 DIAGNOSIS — I10 Essential (primary) hypertension: Secondary | ICD-10-CM | POA: Diagnosis present

## 2012-03-21 DIAGNOSIS — J4489 Other specified chronic obstructive pulmonary disease: Secondary | ICD-10-CM

## 2012-03-21 DIAGNOSIS — J449 Chronic obstructive pulmonary disease, unspecified: Secondary | ICD-10-CM

## 2012-03-21 DIAGNOSIS — D649 Anemia, unspecified: Secondary | ICD-10-CM

## 2012-03-21 DIAGNOSIS — R627 Adult failure to thrive: Secondary | ICD-10-CM | POA: Diagnosis present

## 2012-03-21 DIAGNOSIS — R110 Nausea: Secondary | ICD-10-CM | POA: Diagnosis present

## 2012-03-21 HISTORY — DX: Essential (primary) hypertension: I10

## 2012-03-21 HISTORY — DX: Unspecified asthma, uncomplicated: J45.909

## 2012-03-21 LAB — CBC
MCH: 23.7 pg — ABNORMAL LOW (ref 26.0–34.0)
MCHC: 30.6 g/dL (ref 30.0–36.0)
RDW: 14.8 % (ref 11.5–15.5)

## 2012-03-21 LAB — BASIC METABOLIC PANEL
BUN: 10 mg/dL (ref 6–23)
Calcium: 9.4 mg/dL (ref 8.4–10.5)
Creatinine, Ser: 0.59 mg/dL (ref 0.50–1.10)
GFR calc Af Amer: >90 mL/min (ref >90)
GFR calc non Af Amer: 89 mL/min — ABNORMAL LOW (ref >90)
Glucose, Bld: 104 mg/dL — ABNORMAL HIGH (ref 70–99)

## 2012-03-21 LAB — GLUCOSE, CAPILLARY: Glucose-Capillary: 100 mg/dL — ABNORMAL HIGH (ref 70–99)

## 2012-03-21 MED ORDER — PANTOPRAZOLE SODIUM 40 MG PO TBEC
40.0000 mg | DELAYED_RELEASE_TABLET | Freq: Every day | ORAL | Status: DC
  Administered 2012-03-22 – 2012-03-27 (×6): 40 mg via ORAL
  Filled 2012-03-21 (×7): qty 1

## 2012-03-21 MED ORDER — ALBUTEROL SULFATE (5 MG/ML) 0.5% IN NEBU: 2.5000 mg | INHALATION_SOLUTION | RESPIRATORY_TRACT | Status: DC | PRN

## 2012-03-21 MED ORDER — SODIUM CHLORIDE 0.9 % IJ SOLN
3.0000 mL | Freq: Two times a day (BID) | INTRAMUSCULAR | Status: DC
  Administered 2012-03-21 – 2012-03-27 (×5): 3 mL via INTRAVENOUS

## 2012-03-21 MED ORDER — SODIUM CHLORIDE 0.9 % IV SOLN
250.0000 mL | INTRAVENOUS | Status: DC | PRN
  Administered 2012-03-22: 250 mL via INTRAVENOUS

## 2012-03-21 MED ORDER — DEXTROSE 5 % IV SOLN
500.0000 mg | INTRAVENOUS | Status: DC
  Administered 2012-03-21 – 2012-03-25 (×5): 500 mg via INTRAVENOUS
  Filled 2012-03-21 (×6): qty 500

## 2012-03-21 MED ORDER — DEXTROSE 5 % IV SOLN
2.0000 g | Freq: Three times a day (TID) | INTRAVENOUS | Status: DC
  Administered 2012-03-21 – 2012-03-26 (×13): 2 g via INTRAVENOUS
  Filled 2012-03-21 (×18): qty 2

## 2012-03-21 MED ORDER — CYANOCOBALAMIN 1000 MCG/ML IJ SOLN
1000.0000 ug | Freq: Every day | INTRAMUSCULAR | Status: AC
  Administered 2012-03-21 – 2012-03-25 (×4): 1000 ug via INTRAMUSCULAR
  Filled 2012-03-21 (×5): qty 1

## 2012-03-21 MED ORDER — ONDANSETRON HCL 4 MG PO TABS: 4.0000 mg | ORAL_TABLET | Freq: Four times a day (QID) | ORAL | Status: DC | PRN

## 2012-03-21 MED ORDER — FOLIC ACID 1 MG PO TABS
1.0000 mg | ORAL_TABLET | Freq: Every day | ORAL | Status: DC
  Administered 2012-03-21 – 2012-03-27 (×5): 1 mg via ORAL
  Filled 2012-03-21 (×7): qty 1

## 2012-03-21 MED ORDER — ACETAMINOPHEN 650 MG RE SUPP: 650.0000 mg | Freq: Four times a day (QID) | RECTAL | Status: DC | PRN

## 2012-03-21 MED ORDER — ACETAMINOPHEN 325 MG PO TABS: 650.0000 mg | ORAL_TABLET | Freq: Four times a day (QID) | ORAL | Status: DC | PRN

## 2012-03-21 MED ORDER — SODIUM CHLORIDE 0.9 % IV SOLN: 250.0000 mL | INTRAVENOUS | Status: DC | PRN

## 2012-03-21 MED ORDER — ADULT MULTIVITAMIN W/MINERALS CH
1.0000 | ORAL_TABLET | Freq: Every day | ORAL | Status: DC
  Administered 2012-03-21 – 2012-03-26 (×2): 1 via ORAL
  Filled 2012-03-21 (×7): qty 1

## 2012-03-21 MED ORDER — SODIUM CHLORIDE 0.9 % IJ SOLN: 3.0000 mL | INTRAMUSCULAR | Status: DC | PRN

## 2012-03-21 MED ORDER — ONDANSETRON HCL 4 MG/2ML IJ SOLN: 4.0000 mg | Freq: Four times a day (QID) | INTRAMUSCULAR | Status: DC | PRN

## 2012-03-21 MED ORDER — INSULIN ASPART 100 UNIT/ML ~~LOC~~ SOLN: 0.0000 [IU] | SUBCUTANEOUS | Status: DC

## 2012-03-21 MED ORDER — ENSURE COMPLETE PO LIQD
237.0000 mL | Freq: Every day | ORAL | Status: DC
  Administered 2012-03-21 – 2012-03-22 (×2): 237 mL via ORAL

## 2012-03-21 NOTE — Progress Notes (Signed)
Quick Note:  Pt is being admitted to hospital per CY and no need to call patient with results as our practice is caring for her at hospital. ______

## 2012-03-21 NOTE — Progress Notes (Signed)
INITIAL ADULT NUTRITION ASSESSMENT Date: 03/21/2012   Time: 3:24 PM Reason for Assessment: consult; appears severely malnourished  ASSESSMENT: Female 71 y.o.  Dx: SHOB, weakness, wt loss  Hx:  Past Medical History  Diagnosis Date  . 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 COPD (chronic obstructive pulmonary disease)   . Bronchiectasis   . Gastroenteritis   . Pneumonia   . Hypercalcemia   . GERD (gastroesophageal reflux disease)   . Family history of anesthesia complication     mother had confusion after surgery  . Hypertension   . Shortness of breath   . Asthma     hx of PNA   Past Surgical History  Procedure Date  . Tonsillectomy   . Lung biopsy     Related Meds:  Scheduled Meds:   . cyanocobalamin  1,000 mcg Intramuscular Daily  . folic acid  1 mg Oral Daily  . insulin aspart  0-15 Units Subcutaneous Q4H  . multivitamin with minerals  1 tablet Oral Daily  . pantoprazole  40 mg Oral Daily  . sodium chloride  3 mL Intravenous Q12H   Continuous Infusions:  PRN Meds:.sodium chloride, acetaminophen, acetaminophen, albuterol, ondansetron (ZOFRAN) IV, ondansetron, sodium chloride   Ht:  5'7"  Wt:  130 lbs (per office visit 7/30)  Ideal Wt:    61.3 % Ideal Wt: 96%  Usual Wt: 145 lbs % Usual Wt: 89%  Food/Nutrition Related Hx: recent wt loss, weakness, failure to thrive  Labs:  CMP     Component Value Date/Time   NA 139 03/19/2012 0959   K 3.8 03/19/2012 0959   CL 98 03/19/2012 0959   CO2 29 03/19/2012 0959   GLUCOSE 134* 03/19/2012 0959   BUN 16 03/19/2012 0959   CREATININE 0.7 03/19/2012 0959   CALCIUM 9.4 03/19/2012 0959   PROT 8.1 03/19/2012 0959   ALBUMIN 3.4* 03/19/2012 0959   AST 22 03/19/2012 0959   ALT 9 03/19/2012 0959   ALKPHOS 76 03/19/2012 0959   BILITOT 0.5 03/19/2012 0959   GFRNONAA 102.77 02/23/2010 0743    Intake: recent admission, no meals yet Output: recent admission, data not available Last BM today PTA  Diet Order: Carb  Control  Supplements/Tube Feeding:  MVI, folic acid, B12  IVF:    Estimated Nutritional Needs:   Kcal: 1770-1890 Protein: 70-82g Fluid: ~1.8 L/day  Pt weighed 142 lbs approximately 3 months ago and has lost 12 lbs in 3 months- 8.4% in 3 months which is significant.  Pt feels some of this weight is related to dehydration.  Pt reports difficulty drinking enough fluid due to feelings of fullness.    Dietary recall: Breakfast:  Egg McMuffin Lunch: Hamburger or chicken nuggets Dinner: Sandwich (PB&J or PB&banana) with fruit Pt not likely meeting 75% of estimated need.  Pt requiring soft foods due to dental extraction earlier today.  Noted consideration of palliative care referral for pt due to likelihood of end-stage disease.  NUTRITION DIAGNOSIS: Unintended wt loss  RELATED TO: shortness of breath, weakness  AS EVIDENCE BY: pt with 8.4% wt loss  MONITORING/EVALUATION(Goals): 1.  Food/Beverage; pt consuming >50% of meals.  EDUCATION NEEDS: -No education needs identified at this time  INTERVENTION: 1.  General healthful diet; encouraged intake. Discussed nutrition goals and ways to achieve 2.  Supplements; Ensure Complete daily.  Dietitian [phone]  DOCUMENTATION CODES Per approved criteria  -Severe malnutrition in the context of chronic illness    Hoyt Koch 03/21/2012, 3:24 PM

## 2012-03-21 NOTE — H&P (Signed)
Patient: Stacy Novak DOB: [birth year] Date of Admission: 03/21/2012            [facility] Date of Consult: 03/21/2012   HPI -  71yo female never smoker with hx cystic bronchiectasis, COPD, GERD who presented as direct admission 8/1 after multiple office visits for weakness, weight loss, worsening SOB and cough.  She has been treated as an outpt most recently with levaquin but cont to decline.  She called the office again 8/1 with c/o worsening cough and SOB and was advised to come to [facility] for admission for further workup.   Allergies:  Allergies  Allergen Reactions  . Daliresp (Roflumilast)     "messed with my head"  . Sulfonamide Derivatives     REACTION: nausea     PMH: Past Medical History  Diagnosis Date  . 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 COPD (chronic obstructive pulmonary disease)   . Bronchiectasis   . Gastroenteritis   . Pneumonia   . Hypercalcemia   . GERD (gastroesophageal reflux disease)     Home meds: Medications Prior to Admission  Medication Sig Dispense Refill  . esomeprazole (NEXIUM) 40 MG capsule Take 40 mg by mouth daily before breakfast.      . hydrochlorothiazide (,MICROZIDE/HYDRODIURIL,) 12.5 MG capsule Take 12.5 mg by mouth daily.        Bertram Gala Glycol-Propyl Glycol (SYSTANE OP) Apply 1 drop to eye daily as needed. For dry eye         Social Hx: History   Social History  . Marital Status: Married    Spouse Name: N/A    Number of Children: N/A  . Years of Education: N/A   Occupational History  . Not on file.   Social History Main Topics  . Smoking status: Never Smoker   . Smokeless tobacco: Not on file  . Alcohol Use: No  . Drug Use: No  . Sexually Active: Not on file   Other Topics Concern  . Not on file   Social History Narrative  . No narrative on file     Family Hx: Family History  Problem Relation Age of Onset  . Hypertension    . Stroke Father   . Alzheimer's disease Mother      ROS: C/o  generalized weakness and fatigue, worsening SOB and cough which is occasionally productive of thick yellow sputum.  She denies fever, chills, chest pain, hemoptysis.  All other systems reviewed and were neg.   Filed Vitals:   03/21/12 1219  BP: 123/62  Pulse: 116  Temp: 97.8 F (36.6 C)  Resp: 16  SpO2: 94%    chest X-ray 7/30>>>Extensive pulmonary parenchymal architectural distortion due to  severe cystic bronchiectasis in the upper and midlung zones with  associated air fluid levels, similar to multiple prior exams   CBC    Component Value Date/Time   WBC 6.9 03/19/2012 0959   RBC 5.19* 03/19/2012 0959   HGB 12.2 03/19/2012 0959   HCT 39.4 03/19/2012 0959   PLT 309.0 03/19/2012 0959   MCV 75.9* 03/19/2012 0959   MCHC 31.0 03/19/2012 0959   RDW 15.5* 03/19/2012 0959   LYMPHSABS 0.8 03/19/2012 0959   MONOABS 0.4 03/19/2012 0959   EOSABS 0.0 03/19/2012 0959   BASOSABS 0.0 03/19/2012 0959     BMET    Component Value Date/Time   NA 139 03/19/2012 0959   K 3.8 03/19/2012 0959   CL 98 03/19/2012 0959   CO2 29  03/19/2012 0959   GLUCOSE 134* 03/19/2012 0959   BUN 16 03/19/2012 0959   CREATININE 0.7 03/19/2012 0959   CALCIUM 9.4 03/19/2012 0959   GFRNONAA 102.77 02/23/2010 0743      EXAM: General:  Frail, chronically ill appearing female, NAD  Neuro: awake, alert, appropriate, MAE CV:  s1s2 rrr PULM: resps even non labored on RA, crackles throughout, exp wheeze  GI: abd soft +bs Extremities: warm and dry, no edema    IMPRESSION/ PLAN:  Cystic bronchiectasis-- severe, progressive, likely near end-stage.  Recent rx with levaquin for ?flare although hard to tell if she is truly having flare or if this is simply progression of her disease.   PLAN -  Ambulatory desat for consideration of home O2  2D echo -- ?pulm HTN  Will hold on steroids for now although may want to consider at some point  IV abx for now - narrow quickly -- will use ceftaz pseudo/azithro (atypical) for pseduomonal  coverage with bronchiectasis Sputum culture  PRN BD  Low threshold vanc  Failure to thrive -- b12 was very low last outpt check.  Overall weak, weight loss.   PLAN - b12 injection daily x 5 days then monthly Folate daily  Check TSH Recheck chem, cbc  Nutrition consult  May want to consider palliative care consult/ hospice consideration as she is likely end-stage and declining quickly  Hyperglycemia --  Hgba1c  SSI  Carb mod diet   HTN --  Takes hctz at home, will hold for now and monitor bp, Rockey Situ, NP 03/21/2012  2:04 PM Pager: [phone]  *Care during the described time interval was provided by me and/or other providers on the critical care team. I have reviewed this patient's available data, including medical history, events of note, physical examination and test results as part of my evaluation.   I have fully examined this patient and agree with above findings.    And edited in full  Mcarthur Rossetti. Tyson Alias, MD, FACP Pgr: [phone] [facility]

## 2012-03-21 NOTE — Telephone Encounter (Signed)
I called, spoke to her son, asked he have her call me on my cell. She is at dentist. I am going to recommend admission for work-up.   Severe, slowly progressive cystic bronchiectasis. Labs did show low B12, hyperglycemia. 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  She probably needs IV antibiotics, 2D echo, consideration for home O2.

## 2012-03-21 NOTE — Progress Notes (Signed)
ANTIBIOTIC CONSULT NOTE - INITIAL  Pharmacy Consult for Ceftazidime Indication: Pseudomonal coverage bronchiectasis  Allergies  Allergen Reactions  . Daliresp (Roflumilast)     "messed with my head"  . Sulfonamide Derivatives     REACTION: nausea    Patient Measurements:     Vital Signs: Temp: 97.8 F (36.6 C) (08/01 1549) BP: 130/63 mmHg (08/01 1549) Pulse Rate: 109  (08/01 1549) Intake/Output from previous day:   Intake/Output from this shift: Total I/O In: 240 [P.O.:240] Out: -   Labs:  Basename 03/21/12 1501 03/19/12 0959  WBC 7.8 6.9  HGB 12.9 12.2  PLT 313 309.0  LABCREA -- --  CREATININE 0.59 0.7   The CrCl is unknown because both a height and weight (above a minimum accepted value) are required for this calculation. No results found for this basename: VANCOTROUGH:2,VANCOPEAK:2,VANCORANDOM:2,GENTTROUGH:2,GENTPEAK:2,GENTRANDOM:2,TOBRATROUGH:2,TOBRAPEAK:2,TOBRARND:2,AMIKACINPEAK:2,AMIKACINTROU:2,AMIKACIN:2, in the last 72 hours   Microbiology: Recent Results (from the past 720 hour(s))  RESPIRATORY CULTURE OR RESPIRATORY AND SPUTUM CULTURE     Status: Normal (Preliminary result)   Collection Time   03/20/12  5:07 PM      Component Value Range Status Comment   GRAM STAIN No WBC Seen   Preliminary    GRAM STAIN Rare Squamous Epithelial Cells Present   Preliminary    GRAM STAIN No Organisms Seen   Preliminary   AFB CULTURE WITH SMEAR     Status: Normal (Preliminary result)   Collection Time   03/20/12  5:07 PM      Component Value Range Status Comment   Preliminary Report Culture will be examined for 6 weeks before    Preliminary    Preliminary Report issuing a Final Report.   Preliminary    ACID FAST SMEAR No Acid Fast Bacilli Seen   Preliminary   FUNGUS CULTURE W SMEAR     Status: Normal (Preliminary result)   Collection Time   03/20/12  5:07 PM      Component Value Range Status Comment   Preliminary Report Culture in Progress for 4 Weeks   Preliminary      Smear Result No Yeast or Fungal Elements Seen   Preliminary     Medical History: Past Medical History  Diagnosis Date  . 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 COPD (chronic obstructive pulmonary disease)   . Bronchiectasis   . Gastroenteritis   . Pneumonia   . Hypercalcemia   . GERD (gastroesophageal reflux disease)   . Family history of anesthesia complication     mother had confusion after surgery  . Hypertension   . Shortness of breath   . Asthma     hx of PNA    Medications:  Scheduled:    . azithromycin  500 mg Intravenous Q24H  . cyanocobalamin  1,000 mcg Intramuscular Daily  . feeding supplement  237 mL Oral Daily  . folic acid  1 mg Oral Daily  . insulin aspart  0-15 Units Subcutaneous Q4H  . multivitamin with minerals  1 tablet Oral Daily  . pantoprazole  40 mg Oral Daily  . sodium chloride  3 mL Intravenous Q12H   Infusions:   Anti-infectives     Start     Dose/Rate Route Frequency Ordered Stop   03/21/12 1730   azithromycin (ZITHROMAX) 500 mg in dextrose 5 % 250 mL IVPB        500 mg 250 mL/hr over 60 Minutes Intravenous Every 24 hours 03/21/12 1619  Assessment: 71 yo female admitted with worsening cough Hx of cystic bronchiectasis to be started on ceftazidime  alleady on azithromycin to cover pseudomonas  Goal of Therapy:  Adequate coverage  Plan:  Ceftazidime 2 gm q 8 h  Lucille Passy 03/21/2012,6:03 PM

## 2012-03-22 ENCOUNTER — Inpatient Hospital Stay (HOSPITAL_COMMUNITY): Payer: Medicare Other

## 2012-03-22 DIAGNOSIS — A310 Pulmonary mycobacterial infection: Secondary | ICD-10-CM

## 2012-03-22 DIAGNOSIS — R0602 Shortness of breath: Secondary | ICD-10-CM

## 2012-03-22 LAB — GLUCOSE, CAPILLARY: Glucose-Capillary: 77 mg/dL (ref 70–99)

## 2012-03-22 LAB — URINALYSIS, ROUTINE W REFLEX MICROSCOPIC
Bilirubin Urine: NEGATIVE
Hgb urine dipstick: NEGATIVE
Protein, ur: NEGATIVE mg/dL
Urobilinogen, UA: 0.2 mg/dL (ref 0.0–1.0)

## 2012-03-22 LAB — HEMOGLOBIN A1C
Hgb A1c MFr Bld: 6.5 % — ABNORMAL HIGH (ref <5.7)
Mean Plasma Glucose: 140 mg/dL — ABNORMAL HIGH (ref <117)

## 2012-03-22 LAB — URINE MICROSCOPIC-ADD ON

## 2012-03-22 LAB — EXPECTORATED SPUTUM ASSESSMENT W GRAM STAIN, RFLX TO RESP C

## 2012-03-22 LAB — TSH: TSH: 1.072 u[IU]/mL (ref 0.350–4.500)

## 2012-03-22 NOTE — Progress Notes (Addendum)
Patient: Stacy Novak DOB: 1941/01/06 Date of Admission: 03/21/2012            [facility] Date of Consult: 03/22/2012   HPI -  71yo female never smoker with hx cystic bronchiectasis, COPD, GERD who presented as direct admission 8/1 after multiple office visits for weakness, weight loss, worsening SOB and cough.  She has been treated as an outpt most recently with levaquin but cont to decline.  She called the office again 8/1 with c/o worsening cough and SOB and was advised to come to [facility] for admission for further workup.   BP 115/59  Pulse 92  Temp 97.5 F (36.4 C) (Axillary)  Resp 16  SpO2 93% Room air   Echo 8/2>>>  EXAM: General:  Frail, chronically ill appearing female, NAD  Neuro: awake, alert, appropriate, MAE CV:  s1s2 rrr PULM: resps even non labored on RA, crackles throughout, exp wheeze improved.  GI: abd soft +bs Extremities: warm and dry, no edema    Lab 03/21/12 1501 03/19/12 0959  NA 138 139  K 3.6 3.8  CL 96 98  CO2 32 29  BUN 10 16  CREATININE 0.59 0.7  GLUCOSE 104* 134*    Lab 03/21/12 1501 03/19/12 0959  HGB 12.9 12.2  HCT 42.1 39.4  WBC 7.8 6.9  PLT 313 309.0   Dg Chest 2 View  03/22/2012  *RADIOLOGY REPORT*  Clinical Data: Chronic cough, congestion.  CHEST - 2 VIEW  Comparison: 7th 30 13  Findings: Again noted are severe areas of cystic bronchiectasis with scattered air-fluid levels in the dilated airways.  Severe scarring.  Hyperinflation/COPD.  No effusions.  Heart is borderline in size.  No real change.  IMPRESSION: No significant change in the severe chronic changes as above.  Original Report Authenticated By: Cyndie Chime, M.D.    IMPRESSION/ PLAN: Failure to thrive -- b12 was very low last outpt check.  Overall weak, weight loss.   PLAN - b12 injection daily x 5 days then monthly Folate daily  f/u TSH Ensure supplements daily   Cystic bronchiectasis-- severe, progressive, likely near end-stage.  Recent rx with levaquin for ? flare  although hard to tell if she is truly having flare or if this is simply progression of her disease and just failure to thrive.  PLAN -  -Ambulatory desat for consideration of home O2  -2D echo -- ?pulm HTN  -IV abx for now - narrow quickly -- will cont ceftaz pseudo/azithro (atypical) for pseduomonal coverage with bronchiectasis -f/u Sputum culture  -PRN BD  -f/u pro calcitonin.   Hyperglycemia --  Hgba1c  SSI  Carb mod diet   HTN --  Takes hctz at home, will hold for now and monitor bp, chem   Will support over the weekend w/ B12, empiric antibiotics and supportive care. Likely will be ready for d/c on Monday.   Patient seen and examined, agree with above note.  I dictated the care and orders written for this patient under my direction.  Koren Bound, M.D. [phone]

## 2012-03-22 NOTE — Progress Notes (Signed)
  Echocardiogram 2D Echocardiogram has been performed. Technically difficult study with limited images due to patient only able to remain in vertical position due to coughing.   Stacy Novak 03/22/2012, 10:00 AM

## 2012-03-23 LAB — GLUCOSE, CAPILLARY
Glucose-Capillary: 103 mg/dL — ABNORMAL HIGH (ref 70–99)
Glucose-Capillary: 95 mg/dL (ref 70–99)

## 2012-03-23 LAB — RESPIRATORY CULTURE OR RESPIRATORY AND SPUTUM CULTURE
Gram Stain: NONE SEEN
Organism ID, Bacteria: NORMAL

## 2012-03-23 MED ORDER — ALBUTEROL SULFATE (5 MG/ML) 0.5% IN NEBU
2.5000 mg | INHALATION_SOLUTION | Freq: Four times a day (QID) | RESPIRATORY_TRACT | Status: DC
  Administered 2012-03-23 – 2012-03-25 (×6): 2.5 mg via RESPIRATORY_TRACT
  Filled 2012-03-23 (×8): qty 0.5

## 2012-03-23 MED ORDER — BUDESONIDE 0.25 MG/2ML IN SUSP
0.2500 mg | Freq: Four times a day (QID) | RESPIRATORY_TRACT | Status: DC
  Administered 2012-03-24 – 2012-03-25 (×4): 0.25 mg via RESPIRATORY_TRACT
  Filled 2012-03-23 (×18): qty 2

## 2012-03-23 NOTE — Progress Notes (Signed)
Patient: Stacy Novak DOB: 11-Jun-1941 Date of Admission: 03/21/2012            [place] [facility] Date of Consult: 03/23/2012   HPI -  71yo female never smoker with hx cystic bronchiectasis, COPD, GERD who presented as direct admission 8/1 after multiple office visits for weakness, weight loss, worsening SOB and cough.  She has been treated as an outpt most recently with levaquin but cont to decline.  She called the office again 8/1 with c/o worsening cough and SOB and was advised to come to [facility] for admission for further workup.   BP 133/59  Pulse 107  Temp 98 F (36.7 C) (Axillary)  Resp 18  SpO2 94% Room air   Echo 8/2>>> poor acoustic window, EF ok, o/w can't evaluate anything else.   Currently the pt is stable overnight.  No increased wob.  Still producing purulent mucus.  Notes general malaise.  EXAM: General:  Frail, chronically ill appearing female, NAD  Neuro: awake, alert, appropriate, MAE CV:  Mild tachy but regular PULM: scattered crackles, no wheezing, +rhonchi.  GI: abd soft +bs Extremities: warm and dry, no edema or cyanosis   Lab 03/21/12 1501 03/19/12 0959  NA 138 139  K 3.6 3.8  CL 96 98  CO2 32 29  BUN 10 16  CREATININE 0.59 0.7  GLUCOSE 104* 134*    Lab 03/21/12 1501 03/19/12 0959  HGB 12.9 12.2  HCT 42.1 39.4  WBC 7.8 6.9  PLT 313 309.0   Dg Chest 2 View  03/22/2012  *RADIOLOGY REPORT*  Clinical Data: Chronic cough, congestion.  CHEST - 2 VIEW  Comparison: 7th 30 13  Findings: Again noted are severe areas of cystic bronchiectasis with scattered air-fluid levels in the dilated airways.  Severe scarring.  Hyperinflation/COPD.  No effusions.  Heart is borderline in size.  No real change.  IMPRESSION: No significant change in the severe chronic changes as above.  Original Report Authenticated By: Cyndie Chime, M.D.    IMPRESSION/ PLAN: Failure to thrive -- b12 was very low last outpt check.  Overall weak, weight loss.   PLAN - b12 injection daily x 5 days  then monthly Folate daily  f/u TSH Ensure supplements daily   Cystic bronchiectasis-- severe, progressive, likely near end-stage.  Recent rx with levaquin for ? flare although hard to tell if she is truly having flare or if this is simply progression of her disease and just failure to thrive.   PLAN -  -Ambulatory desat for consideration of home O2   -continue iv abx, add flutter valve -add scheduled beta agonist with h/o moderate to severe airflow obstruction on spirometry, as well as ICS.  Hyperglycemia -- cbg's ok past 24hrs. Hgba1c  SSI  Carb mod diet   HTN --  Takes hctz at home, will hold for now and monitor bp, Curley Spice, M.D. [phone]

## 2012-03-24 LAB — CULTURE, RESPIRATORY W GRAM STAIN: Culture: NORMAL

## 2012-03-24 LAB — GLUCOSE, CAPILLARY
Glucose-Capillary: 102 mg/dL — ABNORMAL HIGH (ref 70–99)
Glucose-Capillary: 84 mg/dL (ref 70–99)
Glucose-Capillary: 124 mg/dL — ABNORMAL HIGH (ref 70–99)

## 2012-03-24 NOTE — Progress Notes (Signed)
Patient: Stacy Novak DOB: [birth year] Date of Admission: 03/21/2012            [place] [facility] Date of Consult: 03/24/2012   HPI -  71yo female never smoker with hx cystic bronchiectasis, COPD, GERD who presented as direct admission 8/1 after multiple office visits for weakness, weight loss, worsening SOB and cough.  She has been treated as an outpt most recently with levaquin but cont to decline.  She called the office again 8/1 with c/o worsening cough and SOB and was advised to come to [facility] for admission for further workup.   BP 141/68  Pulse 97  Temp 98.4 F (36.9 C) (Oral)  Resp 16  Ht 5' 6.93" (1.7 m)  Wt 59.1 kg (130 lb 4.7 oz)  BMI 20.45 kg/m2  SpO2 91% Room air   Echo 8/2>>> poor acoustic window, EF ok, o/w can't evaluate anything else.   Currently the pt is stable overnight.  No increased wob.  Started on neb BD with ICS yesterday, and she thinks she slept better yesterday.   EXAM: General:  Frail, chronically ill appearing female, NAD  Neuro: awake, alert, appropriate, MAE CV:  RRR, no murmurs PULM: scattered crackles, no wheezing, +rhonchi.  GI: abd soft +bs Extremities: warm and dry, no edema or cyanosis   Lab 03/21/12 1501 03/19/12 0959  NA 138 139  K 3.6 3.8  CL 96 98  CO2 32 29  BUN 10 16  CREATININE 0.59 0.7  GLUCOSE 104* 134*    Lab 03/21/12 1501 03/19/12 0959  HGB 12.9 12.2  HCT 42.1 39.4  WBC 7.8 6.9  PLT 313 309.0   No results found.  IMPRESSION/ PLAN: Failure to thrive -- b12 was very low last outpt check.  TSH normal.  Overall weak, weight loss.  ???due to MAC?? PLAN - b12 injection daily x 5 days then monthly Folate daily  Ensure supplements daily   Cystic bronchiectasis with exacerbation-- severe, progressive, likely near end-stage.  Recent rx with levaquin for ? flare although hard to tell if she is truly having flare or if this is simply progression of her disease and just failure to thrive.   PLAN -  -Ambulatory desat for  consideration of home O2   -continue iv abx, add flutter valve.  Cultures still not finalized. -add scheduled beta agonist with h/o moderate to severe airflow obstruction on spirometry, as well as ICS.  Hyperglycemia -- cbg's ok past 24hrs. Hgba1c  SSI  Carb mod diet   HTN --  Takes hctz at home, will hold for now and monitor bp, Curley Spice, M.D. [phone]

## 2012-03-24 NOTE — Progress Notes (Addendum)
ANTIBIOTIC CONSULT NOTE - INITIAL  Pharmacy Consult for Ceftazidime Indication: Pseudomonal coverage bronchiectasis  Allergies  Allergen Reactions  . Daliresp (Roflumilast)     "messed with my head"  . Sulfonamide Derivatives     REACTION: nausea    Patient Measurements: Height: 5' 6.93" (170 cm) Weight: 130 lb 4.7 oz (59.1 kg) IBW/kg (Calculated) : 61.44    Vital Signs: Temp: 98.4 F (36.9 C) (08/04 0525) Temp src: Oral (08/04 0525) BP: 141/68 mmHg (08/04 0525) Pulse Rate: 97  (08/04 0525)  Labs:  Basename 03/21/12 1501  WBC 7.8  HGB 12.9  PLT 313  LABCREA --  CREATININE 0.59   Estimated Creatinine Clearance: 58.4 ml/min (by C-G formula based on Cr of 0.59).  Microbiology: Recent Results (from the past 720 hour(s))  RESPIRATORY CULTURE OR RESPIRATORY AND SPUTUM CULTURE     Status: Normal   Collection Time   03/20/12  5:07 PM      Component Value Range Status Comment   GRAM STAIN No WBC Seen   Final    GRAM STAIN Rare Squamous Epithelial Cells Present   Final    GRAM STAIN No Organisms Seen   Final    Organism ID, Bacteria Normal Oropharyngeal Flora   Final   AFB CULTURE WITH SMEAR     Status: Normal (Preliminary result)   Collection Time   03/20/12  5:07 PM      Component Value Range Status Comment   Preliminary Report Culture will be examined for 6 weeks before    Preliminary    Preliminary Report issuing a Final Report.   Preliminary    ACID FAST SMEAR No Acid Fast Bacilli Seen   Preliminary   FUNGUS CULTURE W SMEAR     Status: Normal (Preliminary result)   Collection Time   03/20/12  5:07 PM      Component Value Range Status Comment   Preliminary Report Culture in Progress for 4 Weeks   Preliminary    Smear Result No Yeast or Fungal Elements Seen   Preliminary   CULTURE, EXPECTORATED SPUTUM-ASSESSMENT     Status: Normal   Collection Time   03/22/12  1:22 AM      Component Value Range Status Comment   Specimen Description SPUTUM   Final    Special  Requests NONE   Final    Sputum evaluation     Final    Value: THIS SPECIMEN IS ACCEPTABLE. RESPIRATORY CULTURE REPORT TO FOLLOW.   Report Status 03/22/2012 FINAL   Final   CULTURE, RESPIRATORY     Status: Normal   Collection Time   03/22/12  1:22 AM      Component Value Range Status Comment   Specimen Description SPUTUM   Final    Special Requests NONE   Final    Gram Stain     Final    Value: RARE WBC PRESENT,BOTH PMN AND MONONUCLEAR     NO SQUAMOUS EPITHELIAL CELLS SEEN     NO ORGANISMS SEEN   Culture NORMAL OROPHARYNGEAL FLORA   Final    Report Status 03/24/2012 FINAL   Final     Medications:  Scheduled:     . albuterol  2.5 mg Nebulization QID  . azithromycin  500 mg Intravenous Q24H  . budesonide  0.25 mg Nebulization QID  . cefTAZidime (FORTAZ)  IV  2 g Intravenous Q8H  . cyanocobalamin  1,000 mcg Intramuscular Daily  . feeding supplement  237 mL Oral Daily  . folic  acid  1 mg Oral Daily  . insulin aspart  0-15 Units Subcutaneous Q4H  . multivitamin with minerals  1 tablet Oral Daily  . pantoprazole  40 mg Oral Daily  . sodium chloride  3 mL Intravenous Q12H    Assessment: 71 YOF, never smoker, admitted 8/1 with worsening cough. Patient had been on levofloxacin PTA, but symptoms continued to worsen-prompting her admission. History of cystic bronchiectasis. Pharmacy consulted to dose ceftazidime for psuedomonas coverage. Patient is also receiving azithromycin. Last SCr drew on 8/1- was 0.59, down from admission SCr of 0.7. CrCl ~63mL/min.  Elita Quick 8/1 >> Zithromax 8/1 >>  8/2 resp cx: rare WBC present; final result of normal flora  Goal of Therapy:  Adequate coverage  Plan:  1. Continue ceftazidime 2g IV Q8hr 2. Will follow clinical status and decision for de-escalation of antibiotics  Lauren D. Bajbus, PharmD Clinical Pharmacist Pager: [phone] Phone: [phone] 03/24/2012 11:52 AM  Addendum completed by: Rolland Porter, Pharm.D., BCPS Clinical  Pharmacist Pager: [phone] 03/24/2012 12:03PM

## 2012-03-25 DIAGNOSIS — J189 Pneumonia, unspecified organism: Secondary | ICD-10-CM

## 2012-03-25 LAB — GLUCOSE, CAPILLARY
Glucose-Capillary: 94 mg/dL (ref 70–99)
Glucose-Capillary: 103 mg/dL — ABNORMAL HIGH (ref 70–99)

## 2012-03-25 NOTE — Progress Notes (Signed)
Patient: Stacy Novak DOB: 06-22-1941 Date of Admission: 03/21/2012            [place] [facility] Date of Consult: 03/25/2012   HPI -  71yo female never smoker with hx cystic bronchiectasis, COPD, GERD who presented as direct admission 8/1 after multiple office visits for weakness, weight loss, worsening SOB and cough.  She has been treated as an outpt most recently with levaquin but cont to decline.  She called the office again 8/1 with c/o worsening cough and SOB and was advised to come to [facility] for admission for further workup.   BP 130/58  Pulse 78  Temp 98.3 F (36.8 C) (Oral)  Resp 16  Ht 5' 6.93" (1.7 m)  Wt 59.1 kg (130 lb 4.7 oz)  BMI 20.45 kg/m2  SpO2 96% Room air   Echo 8/2>>>  EXAM: General:  Frail, chronically ill appearing female, NAD  Neuro: awake, alert, appropriate, MAE CV:  s1s2 rrr PULM: resps even non labored on RA, crackles throughout, exp wheeze improved.  GI: abd soft +bs Extremities: warm and dry, no edema    Lab 03/21/12 1501 03/19/12 0959  NA 138 139  K 3.6 3.8  CL 96 98  CO2 32 29  BUN 10 16  CREATININE 0.59 0.7  GLUCOSE 104* 134*    Lab 03/21/12 1501 03/19/12 0959  HGB 12.9 12.2  HCT 42.1 39.4  WBC 7.8 6.9  PLT 313 309.0   No results found.  IMPRESSION/ PLAN: Failure to thrive -- b12 was very low last outpt check.  Overall weak, weight loss.   PLAN - b12 injection daily x 5 days then monthly Folate daily  f/u TSH Ensure supplements daily   Cystic bronchiectasis-- severe, progressive, likely near end-stage.  Recent rx with levaquin for ? flare although hard to tell if she is truly having flare or if this is simply progression of her disease and just failure to thrive.  ?MAC, patient has had previous history. PLAN -  -Home O2. -2D echo -- was unhelpful, recommended TEE or MUGA, will hold off that for now. -IV abx for now - will call ID consult. -f/u Sputum culture is negative. -PRN BD.  Hyperglycemia --  Hgba1c  SSI  Carb mod diet    HTN --  Takes hctz at home, will hold for now and monitor bp, chem  Will have ID see patient, if MAC is a possibility will bronch.  Will repeat CXR as well.  Patient seen and examined, agree with above note.  I dictated the care and orders written for this patient under my direction.  Koren Bound, M.D. [phone]

## 2012-03-25 NOTE — Consult Note (Signed)
[facility]  Total days of antibiotics: 5        Day 5: azithromycin         Day 5: cefTAZidime       Reason for Consult: Antibiotic coverage for acute flare/ progression of Cystic Bronchiectasis    Referring Physician: Alyson Reedy, MD  I reviewed Pt's current medications . albuterol  2.5 mg Nebulization QID  . azithromycin  500 mg Intravenous Q24H  . budesonide  0.25 mg Nebulization QID  . cefTAZidime (FORTAZ)  IV  2 g Intravenous Q8H  . cyanocobalamin  1,000 mcg Intramuscular Daily  . feeding supplement  237 mL Oral Daily  . folic acid  1 mg Oral Daily  . insulin aspart  0-15 Units Subcutaneous Q4H  . multivitamin with minerals  1 tablet Oral Daily  . pantoprazole  40 mg Oral Daily  . sodium chloride  3 mL Intravenous Q12H   HPI: Stacy Novak is a 71 y.o. female PMH of Cystic Bronchiectasis, MAC, Pneumonia, COPD, GERD p/w weakness, weight loss, worsening SOB and productive cough with yellowish to pink color and loose mucus. Pt had multiple office visit recently and had chest X-ray on 03/19/2012 with positive findings of extensive pulmonary parenchymal architectural distortion associated air fluid levels similar to multiple prior exams referred to [facility] for further evaluation. Pt's symptoms going on for more than 2 months. She has lost 10 lbs weight unintentionally in 2 months. C/o fatigue also.  Recent treatment with Levaquin for possible Pneumonia on June/2013. Otherwise she denies fever, n/v, chest pain, rashes, abdominal pain, constipation, diarrhea or dysuria. Pt is not on home oxygen.  Review of Systems: Pertinent items are noted in HPI.  Past Medical History  Diagnosis Date  . 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 COPD (chronic obstructive pulmonary disease)   . Bronchiectasis   . Gastroenteritis   . Pneumonia   . Hypercalcemia   . GERD (gastroesophageal reflux disease)   . Family history of anesthesia complication    mother had confusion after surgery  . Hypertension   . Shortness of breath   . Asthma     hx of PNA    History  Substance Use Topics  . Smoking status: Never Smoker   . Smokeless tobacco: Never Used  . Alcohol Use: No    Family History  Problem Relation Age of Onset  . Hypertension    . Stroke Father   . Alzheimer's disease Mother    Allergies  Allergen Reactions  . Daliresp (Roflumilast)     "messed with my head"  . Sulfonamide Derivatives     REACTION: nausea    OBJECTIVE: Blood pressure 130/58, pulse 78, temperature 98.3 F (36.8 C), temperature source Oral, resp. rate 16, height 5' 6.93" (1.7 m), weight 59.1 kg (130 lb 4.7 oz), SpO2 96.00%. General: resting in bed, frail, chronically ill appearing female, NAD  Skin: No rash Lungs: fine crackles and wheeze in both lung fields Cor: RRR, no rubs, murmurs or gallops Abdomen: soft, nontender, nondistended, BS present Ext: warm and well perfused, no pedal edema, cyanosis or clubbing Neuro: alert and oriented X3  Microbiology: Recent Results (from the past 240 hour(s))  RESPIRATORY CULTURE OR RESPIRATORY AND SPUTUM CULTURE     Status: Normal   Collection Time   03/20/12  5:07 PM      Component Value Range Status Comment   GRAM STAIN No WBC Seen   Final  GRAM STAIN Rare Squamous Epithelial Cells Present   Final    GRAM STAIN No Organisms Seen   Final    Organism ID, Bacteria Normal Oropharyngeal Flora   Final   AFB CULTURE WITH SMEAR     Status: Normal (Preliminary result)   Collection Time   03/20/12  5:07 PM      Component Value Range Status Comment   Preliminary Report Culture will be examined for 6 weeks before    Preliminary    Preliminary Report issuing a Final Report.   Preliminary    ACID FAST SMEAR No Acid Fast Bacilli Seen   Preliminary   FUNGUS CULTURE W SMEAR     Status: Normal (Preliminary result)   Collection Time   03/20/12  5:07 PM      Component Value Range Status Comment   Preliminary Report  Culture in Progress for 4 Weeks   Preliminary    Smear Result No Yeast or Fungal Elements Seen   Preliminary   CULTURE, EXPECTORATED SPUTUM-ASSESSMENT     Status: Normal   Collection Time   03/22/12  1:22 AM      Component Value Range Status Comment   Specimen Description SPUTUM   Final    Special Requests NONE   Final    Sputum evaluation     Final    Value: THIS SPECIMEN IS ACCEPTABLE. RESPIRATORY CULTURE REPORT TO FOLLOW.   Report Status 03/22/2012 FINAL   Final   CULTURE, RESPIRATORY     Status: Normal   Collection Time   03/22/12  1:22 AM      Component Value Range Status Comment   Specimen Description SPUTUM   Final    Special Requests NONE   Final    Gram Stain     Final    Value: RARE WBC PRESENT,BOTH PMN AND MONONUCLEAR     NO SQUAMOUS EPITHELIAL CELLS SEEN     NO ORGANISMS SEEN   Culture NORMAL OROPHARYNGEAL FLORA   Final    Report Status 03/24/2012 FINAL   Final     Assessment: Stacy Novak is a 71 y.o. female PMH of Cystic Bronchiectasis, MAC, Pneumonia,COPD,GERD p/w acute flare/ progression of Cystic Bronchiectasis.  Pt is on azithromycin and ceftazidime since admission. Pt has been afebrile and no leukocytosis positive on admission lab. Recent sputum culture negative. Based on pt's symptoms we are concern for acute flare of bronchiectasis.We are also considering MAC, Aspergillosis, acute HIV or Lung cancer. Pt's symptoms are progressively improving especially she is feeling better today.  Plan 1. Continue azithromycin 500 mg, IV, Q24H 2. Ceftazidime 2 g, IV, Q8H 3.  AFB culture with smear 4.  Fungus culture with smear 5.  HIV antibody 6. F/u CBC  7. Bronchoscopy not at this time 8. Disposition:  Pt's symtoms are improving. We will see above lab results tomorrow. Pt might leave with oral antibiotics.   Junious Silk,   03/25/2012, 12:42 PM   Infectious Disease Attending Note Date: 03/25/2012  Patient name: Stacy Novak  Medical record number: [id number]  Date  of birth: Apr 13, 1941    This patient has been seen and discussed with the house staff. Please see their note for complete details. 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 Ninetta Lights. Her persistent pulmonary symptoms and weight loss make me concerned for M avium, but I feel we can make this diagnosis  through sputum collection for AFB. Note if she worsens again OFF of antibiotics I would consider restarting empiric therapy with azithro/rifampin/ethambutol pending the AFB culture data.  For now I would get another AFB sputum for culture as well as a fungal culture on sputum.  With re to her possible acute infection agree with completing 5 day course of ceftazdime and azithromycin and then dcing these antibiotics.  Patient should be followed up in the [facility] with Dr. Ninetta Lights or one of other MD's in our ID group.  Acey Lav 03/25/2012, 7:37 PM

## 2012-03-26 ENCOUNTER — Inpatient Hospital Stay (HOSPITAL_COMMUNITY): Payer: Medicare Other

## 2012-03-26 LAB — GLUCOSE, CAPILLARY
Glucose-Capillary: 87 mg/dL (ref 70–99)
Glucose-Capillary: 90 mg/dL (ref 70–99)
Glucose-Capillary: 86 mg/dL (ref 70–99)
Glucose-Capillary: 99 mg/dL (ref 70–99)

## 2012-03-26 LAB — BASIC METABOLIC PANEL
CO2: 30 mEq/L (ref 19–32)
Calcium: 8.8 mg/dL (ref 8.4–10.5)
Creatinine, Ser: 0.47 mg/dL — ABNORMAL LOW (ref 0.50–1.10)
GFR calc non Af Amer: >90 mL/min (ref >90)
Glucose, Bld: 93 mg/dL (ref 70–99)

## 2012-03-26 LAB — CBC
Hemoglobin: 10.6 g/dL — ABNORMAL LOW (ref 12.0–15.0)
MCH: 23.2 pg — ABNORMAL LOW (ref 26.0–34.0)
MCHC: 30.4 g/dL (ref 30.0–36.0)
MCV: 76.4 fL — ABNORMAL LOW (ref 78.0–100.0)
RBC: 4.57 MIL/uL (ref 3.87–5.11)

## 2012-03-26 MED ORDER — SODIUM CHLORIDE 0.9 % IV SOLN: 250.0000 mL | INTRAVENOUS | Status: DC | PRN

## 2012-03-26 MED ORDER — DEXTROSE 5 % IV SOLN
2.0000 g | Freq: Three times a day (TID) | INTRAVENOUS | Status: DC
  Administered 2012-03-26 – 2012-03-27 (×4): 2 g via INTRAVENOUS
  Filled 2012-03-26 (×6): qty 2

## 2012-03-26 MED ORDER — BUDESONIDE 0.25 MG/2ML IN SUSP
0.5000 mg | Freq: Two times a day (BID) | RESPIRATORY_TRACT | Status: DC
  Filled 2012-03-26 (×4): qty 4

## 2012-03-26 NOTE — Progress Notes (Signed)
Patient: AMAKA GLUTH DOB: 07/22/1941 Date of Admission: 03/21/2012            [place] [facility]M Date of Consult: 03/26/2012   HPI -  71yo female never smoker with hx cystic bronchiectasis, COPD, GERD who presented as direct admission 8/1 after multiple office visits for weakness, weight loss, worsening SOB and cough.  She has been treated as an outpt most recently with levaquin but cont to decline.  She called the office again 8/1 with c/o worsening cough and SOB and was advised to come to [facility] for admission for further workup.   BP 138/59  Pulse 95  Temp 97.7 F (36.5 C) (Oral)  Resp 18  Ht 5' 6.93" (1.7 m)  Wt 130 lb 4.7 oz (59.1 kg)  BMI 20.45 kg/m2  SpO2 95%   Echo 8/2>>>EF 60%  Had a rough time with IVs  EXAM: General:  Frail, chronically ill appearing female, NAD  Neuro: awake, alert, appropriate, MAE CV:  s1s2 rrr PULM: resps even non labored on RA, crackles throughout, exp wheeze resolved GI: abd soft +bs Extremities: warm and dry, no edema    Lab 03/26/12 0600 03/21/12 1501  NA 139 138  K 3.3* 3.6  CL 102 96  CO2 30 32  BUN 10 10  CREATININE 0.47* 0.59  GLUCOSE 93 104*    Lab 03/26/12 0600 03/21/12 1501  HGB 10.6* 12.9  HCT 34.9* 42.1  WBC 5.8 7.8  PLT 253 313   Dg Chest Port 1 View  03/26/2012  *RADIOLOGY REPORT*  Clinical Data: Pneumonia  PORTABLE CHEST - 1 VIEW  Comparison: 03/22/2012; 03/19/2012; 01/19/2012; 02/22/2010; chest CT - 03/01/2010  Findings:  Grossly unchanged cardiac silhouette and mediastinal contours. Grossly stable findings of upper lobe predominant cystic lung disease, architectural distortion and volume loss with scattered areas of opacification, right greater than left.  No new focal airspace opacities.  There is persistent blunting of the bilateral costophrenic angles without definite pleural effusion.  No definite pneumothorax.  Unchanged bones.  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  Original Report Authenticated By: Waynard Reeds, M.D.    IMPRESSION/ PLAN: Failure to thrive -- b12 was very low last outpt check.  Overall weak, weight loss.   PLAN - b12 injection daily x 5 days then monthly Folate daily  TSH nml Ensure supplements daily   Cystic bronchiectasis-- severe, progressive.  Recent rx with levaquin for ? flare although hard to tell if she is truly having flare or if this is simply progression of her disease and just failure to thrive.  ?MAC, patient has had previous history. PLAN -  -Home O2. -2D echo -- was unhelpful, recommended TEE or MUGA, will hold off that for now. -ID consult appreciated -f/u Sputum culture is negative, await sp afb -dc azithro (partially treats [facility]), she prefers not to have bronchoscopy -PRN BD.  Hyperglycemia --  Hgba1c  SSI  Carb mod diet   HTN --  Takes hctz at home, will hold for now and monitor bp, Rozanna Boer., M.D.  [phone]

## 2012-03-26 NOTE — Progress Notes (Signed)
Utilization review completed. Leanza Shepperson, RN, BSN. 

## 2012-03-26 NOTE — Progress Notes (Signed)
Nutrition Follow-up  Intervention:   1.  Supplement; magic cup with dinner 2.  General healthful diet; discussed ways to increased kcal and protein content of food.  Assessment:   Pt short of breath at time of visit due to just having walked from bathroom.  Pt states she has been able to complete meals, however it takes her ~2 hrs to finish.  Pt did try Breeze and Ensure, however did not like either.  Also does not like Ensure pudding very much- states it thickened her mucous.   Discussed ways to increase kcal content of meals using high calorie, high protein principles.   Pt's tooth pain has improved, pt had a return visit to dentist office scheduled for this Thursday due to broken crown.    Pt likely meeting needs with intake, however recommend close monitoring of weight as outpatient to ensure pt's energy expenditure from routine daily tasks does not lead to further loss.  Further discussed home diet with pt who adds that she typically drinks juice and sweet tea, and eats candy at home to supplement meals.  Diet Order:  Regular  Meds: Scheduled Meds:   . albuterol  2.5 mg Nebulization QID  . budesonide  0.5 mg Nebulization BID  . cefTAZidime (FORTAZ)  IV  2 g Intravenous Q8H  . cyanocobalamin  1,000 mcg Intramuscular Daily  . feeding supplement  237 mL Oral Daily  . folic acid  1 mg Oral Daily  . insulin aspart  0-15 Units Subcutaneous Q4H  . multivitamin with minerals  1 tablet Oral Daily  . pantoprazole  40 mg Oral Daily  . sodium chloride  3 mL Intravenous Q12H  . DISCONTD: azithromycin  500 mg Intravenous Q24H  . DISCONTD: budesonide  0.25 mg Nebulization QID  . DISCONTD: cefTAZidime (FORTAZ)  IV  2 g Intravenous Q8H   Continuous Infusions:  PRN Meds:.sodium chloride, acetaminophen, acetaminophen, albuterol, ondansetron (ZOFRAN) IV, ondansetron, sodium chloride, DISCONTD: sodium chloride  Labs:  CMP     Component Value Date/Time   NA 139 03/26/2012 0600   K 3.3* 03/26/2012  0600   CL 102 03/26/2012 0600   CO2 30 03/26/2012 0600   GLUCOSE 93 03/26/2012 0600   BUN 10 03/26/2012 0600   CREATININE 0.47* 03/26/2012 0600   CALCIUM 8.8 03/26/2012 0600   PROT 8.1 03/19/2012 0959   ALBUMIN 3.4* 03/19/2012 0959   AST 22 03/19/2012 0959   ALT 9 03/19/2012 0959   ALKPHOS 76 03/19/2012 0959   BILITOT 0.5 03/19/2012 0959   GFRNONAA >90 03/26/2012 0600   GFRAA >90 03/26/2012 0600     Intake/Output Summary (Last 24 hours) at 03/26/12 1445 Last data filed at 03/26/12 0900  Gross per 24 hour  Intake    240 ml  Output      0 ml  Net    240 ml    Weight Status:  No new wt.  Restatement of needs:  1770-1890 kcal, 70-82g protein  Nutrition Dx:  Unintentional wt loss, ongoing.  Monitor:   1. General healthful diet; encouraged intake. Discussed nutrition goals and ways to achieve.  Ongoing, continue. 2. Supplements; Ensure Complete daily.  Not met, pt did not like. Ordered magic cup for pt to try.   Loyce Dys, MS RD LDN Clinical Inpatient Dietitian Pager: [phone] Weekend/After hours pager: [phone]

## 2012-03-26 NOTE — Progress Notes (Signed)
[facility]    Date of Admission:  03/21/2012                   Total days of antibiotics: 6                                                                                          Day 6: azithromycin                                                                                           Day 6: ceftazidime  ID: Antibiotic coverage for acute flare/ progression of Cystic Bronchiectasis   Active Problems:  * No active hospital problems. *   . albuterol  2.5 mg Nebulization QID  . azithromycin  500 mg Intravenous Q24H  . budesonide  0.25 mg Nebulization QID  . cefTAZidime (FORTAZ)  IV  2 g Intravenous Q8H  . cyanocobalamin  1,000 mcg Intramuscular Daily  . feeding supplement  237 mL Oral Daily  . folic acid  1 mg Oral Daily  . insulin aspart  0-15 Units Subcutaneous Q4H  . multivitamin with minerals  1 tablet Oral Daily  . pantoprazole  40 mg Oral Daily  . sodium chloride  3 mL Intravenous Q12H    Subjective: Pt was having breakfast when I saw her in the morning. C/o some nausea. But no vomiting. She slept well last night. Her SOB and cough still same. Otherwise she denies fever,chills, chest pain, rashes, abdominal pain, constipation, diarrhea or dysuria. No acute changes in chest X-ray and no leukocytosis in morning lab.  Objective: Temp:  [98.1 F (36.7 C)-98.3 F (36.8 C)] 98.3 F (36.8 C) (08/06 0528) Pulse Rate:  [94-107] 94  (08/06 0528) Resp:  [16] 16  (08/06 0528) BP: (121-140)/(61-75) 140/62 mmHg (08/06 0528) SpO2:  [92 %-95 %] 92 % (08/06 0528)  General: frail, chronically ill appearing female, NAD  Skin: No rash  Lungs: fine crackles and wheeze in both lung fields  Cor: RRR, no rubs, murmurs or gallops  Abdomen: soft, nontender, nondistended, BS present  Ext: warm and well perfused, no pedal edema, cyanosis or clubbing  Neuro: alert and oriented X3   Lab Results Lab Results  Component Value Date   WBC 5.8 03/26/2012   HGB 10.6*  03/26/2012   HCT 34.9* 03/26/2012   MCV 76.4* 03/26/2012   PLT 253 03/26/2012    Lab Results  Component Value Date   CREATININE 0.47* 03/26/2012   BUN 10 03/26/2012   NA 139 03/26/2012   K 3.3* 03/26/2012   CL 102 03/26/2012   CO2 30 03/26/2012    Lab Results  Component Value Date   ALT 9 03/19/2012   AST 22 03/19/2012   ALKPHOS 76  03/19/2012   BILITOT 0.5 03/19/2012      Microbiology: Recent Results (from the past 240 hour(s))  RESPIRATORY CULTURE OR RESPIRATORY AND SPUTUM CULTURE     Status: Normal   Collection Time   03/20/12  5:07 PM      Component Value Range Status Comment   GRAM STAIN No WBC Seen   Final    GRAM STAIN Rare Squamous Epithelial Cells Present   Final    GRAM STAIN No Organisms Seen   Final    Organism ID, Bacteria Normal Oropharyngeal Flora   Final   AFB CULTURE WITH SMEAR     Status: Normal (Preliminary result)   Collection Time   03/20/12  5:07 PM      Component Value Range Status Comment   Preliminary Report Culture will be examined for 6 weeks before    Preliminary    Preliminary Report issuing a Final Report.   Preliminary    ACID FAST SMEAR No Acid Fast Bacilli Seen   Preliminary   FUNGUS CULTURE W SMEAR     Status: Normal (Preliminary result)   Collection Time   03/20/12  5:07 PM      Component Value Range Status Comment   Preliminary Report Culture in Progress for 4 Weeks   Preliminary    Smear Result No Yeast or Fungal Elements Seen   Preliminary   CULTURE, EXPECTORATED SPUTUM-ASSESSMENT     Status: Normal   Collection Time   03/22/12  1:22 AM      Component Value Range Status Comment   Specimen Description SPUTUM   Final    Special Requests NONE   Final    Sputum evaluation     Final    Value: THIS SPECIMEN IS ACCEPTABLE. RESPIRATORY CULTURE REPORT TO FOLLOW.   Report Status 03/22/2012 FINAL   Final   CULTURE, RESPIRATORY     Status: Normal   Collection Time   03/22/12  1:22 AM      Component Value Range Status Comment   Specimen Description SPUTUM   Final      Special Requests NONE   Final    Gram Stain     Final    Value: RARE WBC PRESENT,BOTH PMN AND MONONUCLEAR     NO SQUAMOUS EPITHELIAL CELLS SEEN     NO ORGANISMS SEEN   Culture NORMAL OROPHARYNGEAL FLORA   Final    Report Status 03/24/2012 FINAL   Final     Studies/Results: Dg Chest Port 1 View  03/26/2012  *RADIOLOGY REPORT*  Clinical Data: Pneumonia  PORTABLE CHEST - 1 VIEW  Comparison: 03/22/2012; 03/19/2012; 01/19/2012; 02/22/2010; chest CT - 03/01/2010  Findings:  Grossly unchanged cardiac silhouette and mediastinal contours. Grossly stable findings of upper lobe predominant cystic lung disease, architectural distortion and volume loss with scattered areas of opacification, right greater than left.  No new focal airspace opacities.  There is persistent blunting of the bilateral costophrenic angles without definite pleural effusion.  No definite pneumothorax.  Unchanged bones.  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  Original Report Authenticated By: Waynard Reeds, M.D.   Assessment: Pt feels better.Has been afebrile and some labs are pending.  Plan: 1. Continue azithromycin 500 mg, IV, Q24H  2. Ceftazidime 2 g, IV, Q8H  3. Pending AFB culture with smear  4. Pending Fungus culture with smear  5. Pending HIV antibody  6. Pending F/u CBC  7. Bronchoscopy not at this time  8. Disposition: Pt's symtoms are improving. We will see above lab results. Pt might leave today with oral antibiotics.     Akter, Nasrin 03/26/2012, 10:33 AM     Infectious Disease Attending Note Date: 03/26/2012  Patient name: TANJA GIFT  Medical record number: [id number]  Date of birth: Nov 28, 1940    This patient has been seen and discussed with the house staff. Please see their note for complete details. I concur with their findings with the following  additions/corrections:   I would NOT continue further antibiotics since she has completed 5 day course and we have never found clear cut evidence for HCAP.  I would get AFB cultures from her sputum as well as fungal cultures from sputum and not push for Bronch with BAL. We could always reinitiate anti M avium therapy while waiting for AFB cultures to return though this could be contemplated as an oupatient. I will sign off for now. Please call with further questions.  Acey Lav 03/26/2012, 4:58 PM

## 2012-03-27 DIAGNOSIS — D510 Vitamin B12 deficiency anemia due to intrinsic factor deficiency: Secondary | ICD-10-CM | POA: Diagnosis present

## 2012-03-27 DIAGNOSIS — R627 Adult failure to thrive: Secondary | ICD-10-CM | POA: Diagnosis present

## 2012-03-27 LAB — GLUCOSE, CAPILLARY
Glucose-Capillary: 96 mg/dL (ref 70–99)
Glucose-Capillary: 86 mg/dL (ref 70–99)

## 2012-03-27 MED ORDER — ADULT MULTIVITAMIN W/MINERALS CH: 1.0000 | ORAL_TABLET | Freq: Every day | ORAL | Status: DC

## 2012-03-27 MED ORDER — ENSURE COMPLETE PO LIQD: 237.0000 mL | Freq: Every day | ORAL | Status: DC

## 2012-03-27 MED ORDER — FOLIC ACID 1 MG PO TABS: 1.0000 mg | ORAL_TABLET | Freq: Every day | ORAL | Status: DC

## 2012-03-27 NOTE — Discharge Summary (Signed)
Physician Discharge Summary  Patient ID: Stacy Novak MRN: [id number] DOB/AGE: [birth year] 71 y.o.  Admit date: 03/21/2012 Discharge date: 03/27/2012    Discharge Diagnoses:  Bronchiectasis with acute exacerbation COPD Anemia Failure to thrive in adult    Brief Summary: Stacy Novak is a 71 y.o. y/o female never smoker with PMH cystic bronchiectasis, COPD, GERD who presented as direct admission 8/1 after multiple office visits for weakness, weight loss, worsening SOB and cough. She has been treated as an outpatient most recently with levaquin but continued to decline. She called the office again 8/1 with c/o worsening cough and SOB and was advised to come to [facility] for admission for further workup. Infectious Disease was consulted in the setting of cystic bronchiectasis / concern for MAC/AFB and she was treated for questionable flare with IV azithromycin and ceftazidime.  Pulmonary cultures below are negative to date.  8/2 ECHO was evaluated which demonstrated an EF of 60%.  CXR's were serially evaluated and thought not to have active HCAP.   She was treated with B12 IV x5 days for anemia and is recommended to receive B12 once monthly as an outpatient.     Consults: Infectious Disease - Dr. [facility]  Microbiology/Sepsis markers: 8/2 Sputum>>>nml flora 8/2 Fungal (sputum)>>>ngtd>>> 8/2 AFB (sputum)>>>ngtd>>>  Antibiotics Azithromycin 8/1>>>8/6 [facility] 8/1>>>8/6  Significant Diagnostic Studies:  8/2 CXR>>>Again noted are severe areas of cystic bronchiectasis with scattered air-fluid levels in the dilated airways. Severe  scarring. Hyperinflation/COPD. No effusions. Heart is borderline in size. No real change.  8/2 2D ECHO>>>Left ventricle: The cavity size was normal. Systolic function was normal. The estimated ejection fraction was in the range of 55% to 60%.  Extremely limited due to poor sound wave transmission; LV function appears to be preserved; focal wall motion  abnormality cannot be excluded; no pericardial effusion; right heart not well visualized; doppler not useful;suggest TEE or MUGA if clinically indicated.  8/6 CXR>>>Chronic severe upper lobe predominant cystic lung disease and scattered areas of opacification without definite superimposed acute cardiopulmonary process.                                                                        Hospital Summary by Discharge Diagnosis  Anemia / Failure to thrive  Noted B12 was very low last outpt check. Overall weakness and weight loss.  Nutrition was consulted and recommended high calorie, high protein meals.  She also has a broken tooth that she is scheduled for repair 8/8.  She was treated with B12 injections x 5 days, folate and MVI.  TSH assessed and within normal limits.    Discharge Plan:  Continue b12 injection monthly  Folate daily  Ensure supplements daily  High protein, high calorie diet  Cystic bronchiectasis Severe, progressive. Recent rx with levaquin for ? flare although hard to tell if she is truly having flare or if this is simply progression of her disease compounded by failure to thrive. ?MAC, patient has had previous history.  2D echo assessed with an EF of 60% -was unhelpful.  Recommend consideration for TEE or MUGA but will hold for now and reassess need as an outpatient.   Bronchoscopy was offered but patient declined.  Infectious disease consulted during  hospital stay.  Patient was treated with IV antibiotics as above and recommended no further antibiotics at time of discharge as she has completed a 5 day course with no clear evidence of HCAP.  Of note, she complained of palpitations with nebulized bronchodilators.    Discharge Plan:  -continue home O2  -follow cultures, AFB -follow up in ID clinic as below for consideration of further Rx for anti M. Avium therapy -Rotating abx can be considered if frequent flares   Hyperglycemia   Patient was maintained on sliding  scale insulin to achieve normal glucose control.  No further insulin regimen at time of discharge.    HTN  On HCTZ at baseline, resume at time of discharge.     Discharge Exam: General: Frail, chronically ill appearing female, NAD  Neuro: awake, alert, appropriate, MAE  CV: s1s2 rrr  PULM: resps even non labored on RA, crackles throughout, exp wheeze resolved  GI: abd soft +bs  Extremities: warm and dry, no edema     Filed Vitals:   03/26/12 0528 03/26/12 1307 03/27/12 0533 03/27/12 1300  BP: 140/62 138/59 134/62 150/63  Pulse: 94 95 92 107  Temp: 98.3 F (36.8 C) 97.7 F (36.5 C) 97.9 F (36.6 C) 97.8 F (36.6 C)  TempSrc: Oral  Oral   Resp: 16 18 16 16   Height:      Weight:      SpO2: 92% 95% 94% 95%   Discharge Labs  BMET  Lab 03/26/12 0600 03/21/12 1501  NA 139 138  K 3.3* 3.6  CL 102 96  CO2 30 32  GLUCOSE 93 104*  BUN 10 10  CREATININE 0.47* 0.59  CALCIUM 8.8 9.4  MG 2.0 --  PHOS 3.5 --    CBC  Lab 03/26/12 0600 03/21/12 1501  HGB 10.6* 12.9  HCT 34.9* 42.1  WBC 5.8 7.8  PLT 253 313     Discharge Orders    Future Appointments: Provider: Department: Dept Phone: Center:   04/02/2012 11:15 AM Waymon Budge, MD [facility] [phone] None   04/17/2012 11:30 AM Ginnie Smart, MD [facility] [phone] [facility]   04/30/2012 10:45 AM Waymon Budge, MD [facility] [phone] None     Future Orders Please Complete By Expires   Diet general      Comments:   High protein, High calorie diet   Increase activity slowly      Discharge instructions      Comments:   -Wear your oxygen as previously set up. -You will need monthly B12 injections in the office   Call MD for:  temperature >100.4      Call MD for:  difficulty breathing, headache or visual disturbances      Call MD for:  extreme fatigue        Follow-up Information    Follow up with Waymon Budge, MD on 04/02/2012. (Appt at 11:15)    Contact information:   [address] [employer] [place] [address] [phone]       Follow up with [name], MD on 04/17/2012. (Appt at 11:30)    Contact information:   [address]  [address] [place] [address] [phone]           Medication List  As of 03/27/2012  5:01 PM   START taking these medications         feeding supplement Liqd  Take 237 mLs by mouth daily.      folic acid 1 MG tablet   Commonly known as: FOLVITE   Take 1 tablet (1 mg total) by mouth daily.      multivitamin with minerals Tabs   Take 1 tablet by mouth daily.         CONTINUE taking these medications         esomeprazole 40 MG capsule   Commonly known as: NEXIUM      hydrochlorothiazide 12.5 MG capsule   Commonly known as: MICROZIDE      SYSTANE OP          Where to get your medications    These are the prescriptions that you need to pick up.   You may get these medications from any pharmacy.         folic acid 1 MG tablet   multivitamin with minerals Tabs         Information on where to get these meds is not yet available. Ask your nurse or doctor.         feeding supplement Liqd              Disposition:  Discharge to home with home O2 (previously set up).   Discharged Condition: ALIANNY TOELLE has met maximum benefit of inpatient care and is medically stable and cleared for discharge.  Patient is pending follow up as above.      Time spent on disposition:  Greater than 35 minutes.   Signed: Canary Brim, NP-C [facility] Pgr: [phone]   Independently examined pt, evaluated data & formulated above discharge care plan with NP   [facility]

## 2012-03-27 NOTE — Progress Notes (Signed)
Pt. discharge to floor,verbalized understanding of discharged instruction,medication,restriction,diet and follow up appointment.Baseline Vitals sign stable,Pt comfortable,no sign and symptom of distress. 

## 2012-04-02 ENCOUNTER — Ambulatory Visit (INDEPENDENT_AMBULATORY_CARE_PROVIDER_SITE_OTHER): Payer: Medicare Other | Admitting: Internal Medicine

## 2012-04-02 ENCOUNTER — Encounter: Payer: Self-pay | Admitting: Internal Medicine

## 2012-04-02 VITALS — BP 136/80 | HR 140 | Ht 67.0 in | Wt 129.2 lb

## 2012-04-02 DIAGNOSIS — J479 Bronchiectasis, uncomplicated: Secondary | ICD-10-CM

## 2012-04-02 DIAGNOSIS — J449 Chronic obstructive pulmonary disease, unspecified: Secondary | ICD-10-CM

## 2012-04-02 DIAGNOSIS — D649 Anemia, unspecified: Secondary | ICD-10-CM

## 2012-04-02 MED ORDER — CYANOCOBALAMIN 1000 MCG/ML IJ SOLN
1000.0000 ug | Freq: Once | INTRAMUSCULAR | Status: AC
  Administered 2012-04-02: 1000 ug via INTRAMUSCULAR

## 2012-04-02 MED ORDER — MAGIC MOUTHWASH: ORAL | Status: DC

## 2012-04-02 NOTE — Progress Notes (Signed)
Patient ID: Stacy Novak, female    DOB: 04/23/1941, 71 y.o.   MRN: [id number]  HPI 11/30/10-71 yo F never smoker, followed for bronchiectasis/ COPD, with hx treated atypical AFB/ MAIC, occasional hemoptysis and GERD.  Last here July 29, 2010 after normal flora on sputum cultures in October. Last antibiotic was augmentin in December. She questions if she should have had longer than 1 week treatment. Daily productive cough- yellow to light green. Shortness of breath with exertion across parking lot, up steps. . She does use Flutter occasionally. . No recent blood, fever, sweats or chest pain. Trial of Daliresp caused malaise.   03/01/11-71 yo F never smoker, followed for bronchiectasis/ COPD, with hx treated atypical AFB/ MAIC, occasional hemoptysis and GERD. No acute issues "just can't breathe and walk". She has avoided the weather extremes recently by staying in.  Cough always productive, greenish. Denies fever, sweat.  CXR- 11/30/10- reviewed with her- no definite new areas, but significant cystic bronchiectasis diffusely with some air fluid levels. PFT- 12/21/10- FEV1 1.02/ 49%; FEV1/FVC 0.54; no response to BD; TLC 0.77; DLCO 0.58    Severe obstructive disease, mild restriction.  6MWT She asks to defer formal oxygen assessment till [facility]. She has less cough in last few days, doesn't feel she could leave sputum specimen, but would like script for brand-name levaquin, which works better.   05/05/11- 71 yo F never smoker, followed for bronchiectasis/ COPD, with hx treated atypical AFB/ MAIC, occasional hemoptysis and GERD. We gave (she requested brand-name) levaquin to hold last visit. She felt she probably needed it because of pleuritic right chest pain that lasted 2-3 days, self limited. She isn't sure there was any change in her daily productive cough- usually white. Maybe some fever once. She waited because she had upset GI with a  little nausea and diarrhea. Still holding the levaquin.   07/06/11-  71 yo F never smoker, followed for bronchiectasis/ COPD, with hx treated atypical AFB/ MAIC, occasional hemoptysis and GERD. Has had flu vaccine. Has not taken the Levaquin prescription we gave previously. She was taking maintenance Zithromax to suppress bronchitis. She stayed off it for 2 weeks because of GI upset with cramps, after successfully taking it for 6 weeks. She has no GI discomfort now. She is going to try resuming maintenance Zithromax, but adding a probiotic. She did not tolerate Daliresp.  11/07/11-  71 yo F never smoker, followed for bronchiectasis/ COPD, with hx treated atypical AFB/ MAIC, occasional hemoptysis and GERD. Still coughing a lot, often productive, with looser mucus. Had tried maintenance Zithromax until she got GI upset. Remains on chronic Nexium for GERD. Discussed her recent reports of cardiac conduction problems with macrolide antibiotics. (REC- next visit- look at HR, chemistry)  01/19/12- 71 yo F never smoker, followed for bronchiectasis/ COPD, with hx treated atypical AFB/ MAIC, occasional hemoptysis and GERD. Coughing up blood-bright red; would happen off and on, but only on May 30 with no repeat today , . SOB and wheezing has increased. Always has some cough which has not changed. No easy bruising or other bleeding. No fever. Previous hemoptysis 2 or 3 years ago at which time we stopped the baby aspirin.  03/19/12-  71 yo F never smoker, followed for bronchiectasis/ COPD, with hx treated atypical AFB/ MAIC, occasional hemoptysis and GERD. Having increased SOB past 3 weeks; losing weight and unsure why; no energy.. Has lost 10 lbs since last visit.  140>130lbs. COPD assessment test (CAT) score 32/40. She complains of weakness, aching joints, weight loss. 2 episodes where she passed mucus per rectum-not blood. Dry eyes, nose and mouth times one month. Bloated with eating-takes her breath. Burning  she-asks B12 shot. Jittery. Has slept up in a chair for years because of chronic midthoracic back pain has not gone back to see Stacy Novak in a long time. She asks if all of this is a side effect of Nexium. We reviewed her radiology again. CXR 01/23/12-  IMPRESSION:  Stable chronic changes with scattered air-fluid levels, as  described above.  Original Report Authenticated By: [facility], M.D.   04/02/12- 60 yo F never smoker, followed for  Cystic bronchiectasis/ COPD, with hx treated atypical AFB/ MAIC, occasional hemoptysis and GERD. Post [facility]-8/1-03/27/12 for dx bronchiectasis w/ acute exacerbation, COPD, anemia/ B12 deficiency, Failure to thrive.Treated vanc/ zithromax/ ceftazidime. Cultures all no growth. Severe diffuse cystic bronchiectasis. Had ID consult. ECHO 8/2- normal EF60%, unable to visualize for PA pressures. Treated B12 deficiency x 5 days, then outpat injection.  She has not re-established with [facility]/ PCP and says there is not a current record there. Air conditioning broken, so she has been staying at their [facility] place. Eating better, not gaining weight. Weak. Thick white mucus in mouth. No fever, chills, blood, pain, nodes.   Review of Systems-See HPI Constitutional:   + weight loss, No-night sweats, No- Fevers, chills, +fatigue, lassitude. HEENT:   No headaches,  Difficulty swallowing,  Tooth/dental problems,  Sore throat,                No sneezing, itching, ear ache, nasal congestion, post nasal drip,  CV:  No chest pain, orthopnea, PND, swelling in lower extremities, anasarca, +dizziness, No-palpitations GI  No heartburn, indigestion, abdominal pain, nausea, vomiting, Resp: +Shortness of breath with exertion. ,  No- coughing up of blood. .  Some minimal wheezing.  Skin: no rash or lesions. GU:   MS:  + joint pain or swelling.   Psych:  No change in mood or affect. No depression + anxiety.  No memory loss.  Objective:   Physical Exam General- Alert,  Oriented, Affect-appropriate, Distress- none acute; thin. Looks chronically ill, slumped posture. Skin- rash-none, lesions- none, excoriation- none Lymphadenopathy- none Head- atraumatic            Eyes- Gross vision intact, PERRLA, conjunctivae clear secretions            Ears- Hearing, canals- normal            Nose- Clear, No-Septal dev, mucus, polyps, erosion, perforation             Throat- Mallampati II , mucosa clear , drainage- none, tonsils- atrophic Neck- flexible , trachea midline, no stridor , thyroid nl, carotid no bruit Chest - symmetrical excursion , unlabored           Heart/CV- rapid RRR ., no murmur , no gallop  , no rub, nl s1 s2                           - JVD- none , edema- none, stasis changes- none, varices- none           Lung-  +few bilateral  rhonchi, unlabored, cough- mild , dullness-none, rub- none           Chest wall-  Abd-  Br/ Gen/ Rectal- Not done, not indicated Extrem- cyanosis- none,  clubbing, none, atrophy- none, strength- nl Neuro- grossly intact to observation

## 2012-04-02 NOTE — Patient Instructions (Addendum)
Ok to try off Nexium for awhile, using Tums or Maalox/ Mylanta for heart burn  Script Dukes Magic mouthwash to try to clear your mouth  B 12 injection today  Dx pernicious anemia  Order- DME ONOX on room air    Dx bronchiectasis

## 2012-04-02 NOTE — Assessment & Plan Note (Signed)
She has hoped that B12 would give "extra energy". I explained. This should be role for her PCP to take over. She is vague about following up with her previous doctor, saying they didn't find a chart.

## 2012-04-02 NOTE — Assessment & Plan Note (Signed)
Plan- ONOX on room air

## 2012-04-03 ENCOUNTER — Telehealth: Payer: Self-pay | Admitting: Internal Medicine

## 2012-04-03 MED ORDER — FIRST-DUKES MOUTHWASH MT SUSP: OROMUCOSAL | Status: DC

## 2012-04-03 NOTE — Telephone Encounter (Signed)
I spoke with pt and she stated when she used the MMW it caused her mouth and tongue to go numb.S he stated no one ever told her it would do this. She wanted to know if this was normal. I advised her yes the MMW will do that. She stated she does not believe she is going to do this 4 times a day but maybe twice a day. Pt needed nothing further.

## 2012-04-03 NOTE — Telephone Encounter (Signed)
I spoke with [facility] and stated the mmw that was sent is different than dukes MMW. Was the right rx sent? I looked in pt chart and looks like dukes MMW was suppose to be sent. Per Florentina Addison okay to give verbal order for dukes. I gave smushi fro [facility] aware of verbal order for dukes mmw. i called and also made pt aware of this. She stated she will go to the pharmacy and pick this up. Nothing further was needed

## 2012-04-08 ENCOUNTER — Other Ambulatory Visit: Payer: Self-pay | Admitting: Internal Medicine

## 2012-04-08 ENCOUNTER — Telehealth: Payer: Self-pay | Admitting: Internal Medicine

## 2012-04-08 DIAGNOSIS — R000 Tachycardia, unspecified: Secondary | ICD-10-CM

## 2012-04-08 NOTE — Telephone Encounter (Signed)
Spoke with patient-aware to stop MMW rx and to get appt with PCP.

## 2012-04-08 NOTE — Telephone Encounter (Signed)
Have her d/c Dukes Magic Mouthwash.  Has she seen her primary physician yet since she got out of hospital?- He can assess rapid heart beat.

## 2012-04-08 NOTE — Telephone Encounter (Signed)
Spoke with patient-states she has been using [facility] MMW as given by CY; used for several days and started having increased heart rate and increased SOB(yesterday). Called the pharmacy and was told that ingredients in Rx could cause this and advised to call CY. Pt states that she has not used any of MMW today and heart rate still at 129 but not as bad as when she uses the Rx. CY please advise. Thanks.    Allergies  Allergen Reactions  . Daliresp (Roflumilast)     "messed with my head"  . Sulfonamide Derivatives     REACTION: nausea

## 2012-04-15 ENCOUNTER — Telehealth: Payer: Self-pay | Admitting: Internal Medicine

## 2012-04-15 NOTE — Telephone Encounter (Signed)
Her ONOX indicates she is candidate for home O2 for sleep at 2l/m. Got no answer. Will wait to discuss before ordering.

## 2012-04-16 LAB — FUNGUS CULTURE W SMEAR

## 2012-04-17 ENCOUNTER — Ambulatory Visit (INDEPENDENT_AMBULATORY_CARE_PROVIDER_SITE_OTHER): Payer: Medicare Other | Admitting: Infectious Diseases

## 2012-04-17 ENCOUNTER — Telehealth: Payer: Self-pay | Admitting: Internal Medicine

## 2012-04-17 ENCOUNTER — Encounter: Payer: Self-pay | Admitting: Infectious Diseases

## 2012-04-17 VITALS — BP 160/79 | HR 123 | Temp 97.3°F | Ht 67.0 in | Wt 125.0 lb

## 2012-04-17 DIAGNOSIS — A310 Pulmonary mycobacterial infection: Secondary | ICD-10-CM

## 2012-04-17 DIAGNOSIS — J479 Bronchiectasis, uncomplicated: Secondary | ICD-10-CM

## 2012-04-17 NOTE — Assessment & Plan Note (Signed)
She is not doing well based on her weight loss and probable recent exacerbation. I reviewed her CXR with her, there is not a huge difference vs her previous studies from 11-2010. She is offered to restart her MAI therapy but she wants "to get her stomach straightened out". She has quit taking her prilosec/nexium and is relying on tums now.will get a repeat sputum AFB on her (she is given a specimen container and will return after she is able to provide a good specimen).   Needs improved nutrition and probably home O2 (i encouraged her multiple times to call [name] about this). We may need GI help with her stomach issues.  Will see her back on October 3.

## 2012-04-17 NOTE — Progress Notes (Signed)
  Subjective:    Patient ID: Stacy Novak, female    DOB: 1941/06/02, 71 y.o.   MRN: [id number]  HPI 71 y.o. female PMH of Cystic Bronchiectasis, MAC, COPD, GERD presented to hospital with 2 months of weakness, weight loss (10#), worsening SOB and productive cough with yellowish to pink color and loose mucus. Pt had multiple office visit recently and had chest X-ray on 03/19/2012 with positive findings of extensive pulmonary parenchymal architectural distortion associated air fluid levels similar to multiple prior exams, referred 03-21-12 to [facility] hospital for further evaluation.  Recent treatment with Levaquin for possible Pneumonia on June/2013. She was offered Bronch in hospital but declined.  She was treated with azithro/ceftaz for 7 days and d/c home on 03-27-12 with non further anbx. She had respiratory Cx done in hospital but she is convinced it was oral sample, not a good sample. So far AFB Cx is (-).  Pt is not on home oxygen yet but has been eval by pulm and is needs to be started on home O2.  Had previously recieved a 1 yr course of E/B 2002. She relapsed in July of 2007 and was started on E/A/R (Cx showed MAI, S-Clofaz, Copywriter, advertising. I- Rifampin. Synergy S- E/R). Last seen in ID August 2009 and further anbx were deffered at that point.   Can't seem to get her energy back since in hospital. Loosing weight, eating well.   Review of Systems  Constitutional: Negative for appetite change and unexpected weight change.       Virals repeated- 132/79, SPO2 on RA 90-91%, HR 107-112  Respiratory: Positive for cough and shortness of breath.   Gastrointestinal: Negative for diarrhea and constipation.  Genitourinary: Negative for dysuria.       Objective:   Physical Exam  Constitutional: She appears cachectic. She is cooperative. No distress.  HENT:  Mouth/Throat: No oropharyngeal exudate.  Eyes: EOM are normal. Pupils are equal, round, and reactive to light.  Neck: Neck supple.  Cardiovascular:  Tachycardia present.   Pulmonary/Chest: Effort normal. She has rhonchi in the left upper field. She has no rales.  Abdominal: Soft. Bowel sounds are normal. There is no tenderness. There is no rebound.  Musculoskeletal: She exhibits no edema.  Lymphadenopathy:    She has no cervical adenopathy.  Neurological: She is alert.  Skin: She is not diaphoretic.          Assessment & Plan:

## 2012-04-17 NOTE — Telephone Encounter (Signed)
Her ONOX on room air qualifies her for home O2 during sleep. She is spending a lot of time at [facility], and will have to work that out with DME. I explained the medical issue of O2.

## 2012-04-30 ENCOUNTER — Ambulatory Visit (INDEPENDENT_AMBULATORY_CARE_PROVIDER_SITE_OTHER): Payer: Medicare Other | Admitting: Internal Medicine

## 2012-04-30 ENCOUNTER — Encounter: Payer: Self-pay | Admitting: Internal Medicine

## 2012-04-30 VITALS — BP 134/64 | HR 117 | Ht 67.0 in | Wt 125.8 lb

## 2012-04-30 DIAGNOSIS — J471 Bronchiectasis with (acute) exacerbation: Secondary | ICD-10-CM

## 2012-04-30 DIAGNOSIS — A310 Pulmonary mycobacterial infection: Secondary | ICD-10-CM

## 2012-04-30 DIAGNOSIS — D510 Vitamin B12 deficiency anemia due to intrinsic factor deficiency: Secondary | ICD-10-CM

## 2012-04-30 DIAGNOSIS — Z23 Encounter for immunization: Secondary | ICD-10-CM

## 2012-04-30 DIAGNOSIS — J449 Chronic obstructive pulmonary disease, unspecified: Secondary | ICD-10-CM

## 2012-04-30 DIAGNOSIS — K219 Gastro-esophageal reflux disease without esophagitis: Secondary | ICD-10-CM

## 2012-04-30 MED ORDER — CYANOCOBALAMIN 1000 MCG/ML IJ SOLN
1000.0000 ug | Freq: Once | INTRAMUSCULAR | Status: AC
  Administered 2012-04-30: 1000 ug via INTRAMUSCULAR

## 2012-04-30 NOTE — Progress Notes (Signed)
Patient ID: Stacy Novak, female    DOB: 04/23/1941, 71 y.o.   MRN: [id number]  HPI 11/30/10-71 yo F never smoker, followed for bronchiectasis/ COPD, with hx treated atypical AFB/ MAIC, occasional hemoptysis and GERD.  Last here July 29, 2010 after normal flora on sputum cultures in October. Last antibiotic was augmentin in December. She questions if she should have had longer than 1 week treatment. Daily productive cough- yellow to light green. Shortness of breath with exertion across parking lot, up steps. . She does use Flutter occasionally. . No recent blood, fever, sweats or chest pain. Trial of Daliresp caused malaise.   03/01/11-71 yo F never smoker, followed for bronchiectasis/ COPD, with hx treated atypical AFB/ MAIC, occasional hemoptysis and GERD. No acute issues "just can't breathe and walk". She has avoided the weather extremes recently by staying in.  Cough always productive, greenish. Denies fever, sweat.  CXR- 11/30/10- reviewed with her- no definite new areas, but significant cystic bronchiectasis diffusely with some air fluid levels. PFT- 12/21/10- FEV1 1.02/ 49%; FEV1/FVC 0.54; no response to BD; TLC 0.77; DLCO 0.58    Severe obstructive disease, mild restriction.  6MWT She asks to defer formal oxygen assessment till she finishes [facility]. She has less cough in last few days, doesn't feel she could leave sputum specimen, but would like script for brand-name levaquin, which works better.   05/05/11- 71 yo F never smoker, followed for bronchiectasis/ COPD, with hx treated atypical AFB/ MAIC, occasional hemoptysis and GERD. We gave (she requested brand-name) levaquin to hold last visit. She felt she probably needed it because of pleuritic right chest pain that lasted 2-3 days, self limited. She isn't sure there was any change in her daily productive cough- usually white. Maybe some fever once. She waited because she had upset GI with a  little nausea and diarrhea. Still holding the levaquin.   07/06/11-  71 yo F never smoker, followed for bronchiectasis/ COPD, with hx treated atypical AFB/ MAIC, occasional hemoptysis and GERD. Has had flu vaccine. Has not taken the Levaquin prescription we gave previously. She was taking maintenance Zithromax to suppress bronchitis. She stayed off it for 2 weeks because of GI upset with cramps, after successfully taking it for 6 weeks. She has no GI discomfort now. She is going to try resuming maintenance Zithromax, but adding a probiotic. She did not tolerate Daliresp.  11/07/11-  71 yo F never smoker, followed for bronchiectasis/ COPD, with hx treated atypical AFB/ MAIC, occasional hemoptysis and GERD. Still coughing a lot, often productive, with looser mucus. Had tried maintenance Zithromax until she got GI upset. Remains on chronic Nexium for GERD. Discussed her recent reports of cardiac conduction problems with macrolide antibiotics. (REC- next visit- look at HR, chemistry)  01/19/12- 71 yo F never smoker, followed for bronchiectasis/ COPD, with hx treated atypical AFB/ MAIC, occasional hemoptysis and GERD. Coughing up blood-bright red; would happen off and on, but only on May 30 with no repeat today , . SOB and wheezing has increased. Always has some cough which has not changed. No easy bruising or other bleeding. No fever. Previous hemoptysis 2 or 3 years ago at which time we stopped the baby aspirin.  03/19/12-  71 yo F never smoker, followed for bronchiectasis/ COPD, with hx treated atypical AFB/ MAIC, occasional hemoptysis and GERD. Having increased SOB past 3 weeks; losing weight and unsure why; no energy.. Has lost 10 lbs since last visit.  140>130lbs. COPD assessment test (CAT) score 32/40. She complains of weakness, aching joints, weight loss. 2 episodes where she passed mucus per rectum-not blood. Dry eyes, nose and mouth times one month. Bloated with eating-takes her breath. Burning  she-asks B12 shot. Jittery. Has slept up in a chair for years because of chronic midthoracic back pain has not gone back to see Dr. [name] in a long time. She asks if all of this is a side effect of Nexium. We reviewed her radiology again. CXR 01/23/12-  IMPRESSION:  Stable chronic changes with scattered air-fluid levels, as  described above.  Original Report Authenticated By: [name], M.D.   04/02/12- 71 yo F never smoker, followed for  Cystic bronchiectasis/ COPD, with hx treated atypical AFB/ MAIC, occasional hemoptysis and GERD. [facility]12 for dx bronchiectasis w/ acute exacerbation, COPD, anemia/ B12 deficiency, Failure to thrive.Treated vanc/ zithromax/ ceftazidime. Cultures all no growth. Severe diffuse cystic bronchiectasis. Had ID consult. ECHO 8/2- normal EF60%, unable to visualize for PA pressures. Treated B12 deficiency x 5 days, then outpat injection.  She has not re-established with [name]/ PCP and says there is not a current record there. Air conditioning broken, so she has been staying at their [facility] place. Eating better, not gaining weight. Weak. Thick white mucus in mouth. No fever, chills, blood, pain, nodes.   04/17/12- [name]/ INF.DIS. DISEASE, PULMONARY D/T MYCOBACTERIA - [name], MD 04/17/2012 12:24 PM Signed  She is not doing well based on her weight loss and probable recent exacerbation. I reviewed her CXR with her, there is not a huge difference vs her previous studies from 11-2010. She is offered to restart her MAI therapy but she wants "to get her stomach straightened out". She has quit taking her prilosec/nexium and is relying on tums now.will get a repeat sputum AFB on her (she is given a specimen container and will return after she is able to provide a good specimen).  Needs improved nutrition and probably home O2 (i encouraged her multiple times to call [name] about this). We may need GI help with her stomach issues.  Will see her  back on October 3.   04/30/12-  71 yo F never smoker, followed for severe Cystic bronchiectasis/ COPD, with hx treated atypical AFB/ MAIC, occasional hemoptysis and GERD. Unable to tell any difference with using O2 QHS-does not use O2 during the day. Increased SOB-not getting any better; Unable to drink much water. Minor epistaxis blamed on dryness from her oxygen. Son thinks/blood-tinged sputum occasionally, otherwise yellowish or greenish. She had stopped Nexium, blaming it for her poor appetite-it seemed to cause bloating. Instead she now uses TUMS and we discussed his meds. Complains of burning in her feet attributed by her to B12 deficiency. I again asked her to get back with her primary physician.  Review of Systems-See HPI Constitutional:   + weight loss, No-night sweats, No- Fevers, chills, +fatigue, lassitude. HEENT:   No headaches,  Difficulty swallowing,  Tooth/dental problems,  Sore throat,                No sneezing, itching, ear ache, nasal congestion, post nasal drip,  CV:  No chest pain, orthopnea, PND, swelling in lower extremities, anasarca, +dizziness, No-palpitations GI  No heartburn, indigestion, abdominal pain, nausea, vomiting, Resp: +Shortness of breath with exertion. ,  + Trace coughing up of blood. .  Some minimal wheezing.  Skin: no rash or lesions. GU:   MS:  + joint pain or  swelling.   Psych:  No change in mood or affect. No depression + anxiety.  No memory loss.  Objective:   Physical Exam General- Alert, Oriented, Affect-appropriate, Distress- none acute; thin. Looks chronically ill, slumped posture. Skin- rash-none, lesions- none, excoriation- none Lymphadenopathy- none Head- atraumatic            Eyes- Gross vision intact, PERRLA, conjunctivae clear secretions            Ears- Hearing, canals- normal            Nose- Clear, No-Septal dev, mucus, polyps, erosion, perforation             Throat- Mallampati II , mucosa clear , drainage- none, tonsils-  atrophic Neck- flexible , trachea midline, no stridor , thyroid nl, carotid no bruit Chest - symmetrical excursion , unlabored           Heart/CV- +rapid RRR ., no murmur , no gallop  , no rub, nl s1 s2                           - JVD- none , edema- none, stasis changes- none, varices- none           Lung-  +few bilateral  Rhonchi, especially right upper lobe, unlabored, cough- mild , dullness-none, rub- none           Chest wall-  Abd-  Br/ Gen/ Rectal- Not done, not indicated Extrem- cyanosis- none, clubbing, none, atrophy- none, strength- nl Neuro- grossly intact to observation

## 2012-04-30 NOTE — Patient Instructions (Addendum)
Consider a liquid antacid like simethicone to break up gas- like Mylanta Plus or Maalox II  Use the O2 every night for sleep  Flu vax  Vit B12 inj monthly for dx B12 deficiency

## 2012-05-03 LAB — AFB CULTURE WITH SMEAR (NOT AT ARMC): Acid Fast Smear: NONE SEEN

## 2012-05-08 NOTE — Assessment & Plan Note (Signed)
Continue monthly B12 injections. She is resistant to reestablish with a primary physician and I am going to have to press her on this.

## 2012-05-08 NOTE — Assessment & Plan Note (Signed)
Severe cystic bronchiectasis. There is no family history of cystic fibrosis. We have never been able to demonstrate active reflux or aspiration. 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 She spends much of her time down at [facility], and this came up during discussion of pulmonary rehabilitation. She is outside of the age range generally considered for pulmonary transplantation.

## 2012-05-08 NOTE — Assessment & Plan Note (Signed)
There is no evidence of relapse treatment. She continues to be followed by Dr. Hatcher/infectious disease.

## 2012-05-08 NOTE — Assessment & Plan Note (Signed)
She blames Nexium for GI discomfort. I'm not sure she is any better using Tums. A significant problem has been bloating so we discussed use of simethicone and opportunity for GI evaluation.

## 2012-05-13 ENCOUNTER — Telehealth: Payer: Self-pay | Admitting: Internal Medicine

## 2012-05-13 ENCOUNTER — Encounter: Payer: Self-pay | Admitting: Internal Medicine

## 2012-05-13 NOTE — Telephone Encounter (Signed)
Called and spoke with patient, patient is currently out of town and since appt has been set and scheduled she will just come to office and not worry w Rx.  Nothing further needed at this time.

## 2012-05-13 NOTE — Telephone Encounter (Signed)
Order augmentin 500 mg, 1 bid # 20,  Please get early f/u here- if katie can't arrange RN slot, then TP. Will need CXR on arrival for dx acute exacerbation of bronchiectasis.

## 2012-05-13 NOTE — Telephone Encounter (Signed)
Set pt w/ CY tomorrow @ 4:15 pm.  I did not advise about augmentin.  Antionette Fairy

## 2012-05-13 NOTE — Telephone Encounter (Signed)
Last OV 04-30-12, next OV 07/30/12. I spoke with the Stacy Novak and she is c/o increased SOB, Stacy Novak could only speak about 4 words to me over the phone without taking a breath, wheezing, and productive cough with dark yellow phelgm that has been at its worse today. She states it has all been gradually getting worse since last OV. Stacy Novak is requesting an appt tomorrow, but can only come in the afternoon because she is currently out of town and cannot make it back for AM appt. Stacy Novak states she is using oxygen at night and all meds as prescribed. Please advise.Carron Curie, CMA Allergies  Allergen Reactions  . Daliresp (Roflumilast)     "messed with my head"  . Sulfonamide Derivatives     REACTION: nausea

## 2012-05-14 ENCOUNTER — Encounter: Payer: Self-pay | Admitting: Internal Medicine

## 2012-05-14 ENCOUNTER — Other Ambulatory Visit (INDEPENDENT_AMBULATORY_CARE_PROVIDER_SITE_OTHER): Payer: Medicare Other

## 2012-05-14 ENCOUNTER — Ambulatory Visit (INDEPENDENT_AMBULATORY_CARE_PROVIDER_SITE_OTHER): Payer: Medicare Other | Admitting: Internal Medicine

## 2012-05-14 VITALS — BP 110/62 | HR 123 | Ht 67.0 in | Wt 124.6 lb

## 2012-05-14 DIAGNOSIS — E876 Hypokalemia: Secondary | ICD-10-CM

## 2012-05-14 DIAGNOSIS — J449 Chronic obstructive pulmonary disease, unspecified: Secondary | ICD-10-CM

## 2012-05-14 DIAGNOSIS — R627 Adult failure to thrive: Secondary | ICD-10-CM

## 2012-05-14 DIAGNOSIS — J471 Bronchiectasis with (acute) exacerbation: Secondary | ICD-10-CM

## 2012-05-14 DIAGNOSIS — J4489 Other specified chronic obstructive pulmonary disease: Secondary | ICD-10-CM

## 2012-05-14 DIAGNOSIS — A310 Pulmonary mycobacterial infection: Secondary | ICD-10-CM

## 2012-05-14 NOTE — Progress Notes (Signed)
Patient ID: Stacy Novak, female    DOB: 04/23/1941, 71 y.o.   MRN: [id number]  HPI 11/30/10-71 yo F never smoker, followed for bronchiectasis/ COPD, with hx treated atypical AFB/ MAIC, occasional hemoptysis and GERD.  Last here July 29, 2010 after normal flora on sputum cultures in October. Last antibiotic was augmentin in December. She questions if she should have had longer than 1 week treatment. Daily productive cough- yellow to light green. Shortness of breath with exertion across parking lot, up steps. . She does use Flutter occasionally. . No recent blood, fever, sweats or chest pain. Trial of Daliresp caused malaise.   03/01/11-71 yo F never smoker, followed for bronchiectasis/ COPD, with hx treated atypical AFB/ MAIC, occasional hemoptysis and GERD. No acute issues "just can't breathe and walk". She has avoided the weather extremes recently by staying in.  Cough always productive, greenish. Denies fever, sweat.  CXR- 11/30/10- reviewed with her- no definite new areas, but significant cystic bronchiectasis diffusely with some air fluid levels. PFT- 12/21/10- FEV1 1.02/ 49%; FEV1/FVC 0.54; no response to BD; TLC 0.77; DLCO 0.58    Severe obstructive disease, mild restriction.  6MWT She asks to defer formal oxygen assessment till she [facility]. She has less cough in last few days, doesn't feel she could leave sputum specimen, but would like script for brand-name levaquin, which works better.   05/05/11- 71 yo F never smoker, followed for bronchiectasis/ COPD, with hx treated atypical AFB/ MAIC, occasional hemoptysis and GERD. We gave (she requested brand-name) levaquin to hold last visit. She felt she probably needed it because of pleuritic right chest pain that lasted 2-3 days, self limited. She isn't sure there was any change in her daily productive cough- usually white. Maybe some fever once. She waited because she had upset GI with a  little nausea and diarrhea. Still holding the levaquin.   07/06/11-  71 yo F never smoker, followed for bronchiectasis/ COPD, with hx treated atypical AFB/ MAIC, occasional hemoptysis and GERD. Has had flu vaccine. Has not taken the Levaquin prescription we gave previously. She was taking maintenance Zithromax to suppress bronchitis. She stayed off it for 2 weeks because of GI upset with cramps, after successfully taking it for 6 weeks. She has no GI discomfort now. She is going to try resuming maintenance Zithromax, but adding a probiotic. She did not tolerate Daliresp.  11/07/11-  71 yo F never smoker, followed for bronchiectasis/ COPD, with hx treated atypical AFB/ MAIC, occasional hemoptysis and GERD. Still coughing a lot, often productive, with looser mucus. Had tried maintenance Zithromax until she got GI upset. Remains on chronic Nexium for GERD. Discussed her recent reports of cardiac conduction problems with macrolide antibiotics. (REC- next visit- look at HR, chemistry)  01/19/12- 71 yo F never smoker, followed for bronchiectasis/ COPD, with hx treated atypical AFB/ MAIC, occasional hemoptysis and GERD. Coughing up blood-bright red; would happen off and on, but only on May 30 with no repeat today , . SOB and wheezing has increased. Always has some cough which has not changed. No easy bruising or other bleeding. No fever. Previous hemoptysis 2 or 3 years ago at which time we stopped the baby aspirin.  03/19/12-  71 yo F never smoker, followed for bronchiectasis/ COPD, with hx treated atypical AFB/ MAIC, occasional hemoptysis and GERD. Having increased SOB past 3 weeks; losing weight and unsure why; no energy.. Has lost 10 lbs since last visit.  140>130lbs. COPD assessment test (CAT) score 32/40. She complains of weakness, aching joints, weight loss. 2 episodes where she passed mucus per rectum-not blood. Dry eyes, nose and mouth times one month. Bloated with eating-takes her breath. Burning  she-asks B12 shot. Jittery. Has slept up in a chair for years because of chronic midthoracic back pain has not gone back to see Stacy Novak in a long time. She asks if all of this is a side effect of Nexium. We reviewed her radiology again. CXR 01/23/12-  IMPRESSION:  Stable chronic changes with scattered air-fluid levels, as  described above.  Original Report Authenticated By: [name], M.D.   04/02/12- 71 yo F never smoker, followed for  Cystic bronchiectasis/ COPD, with hx treated atypical AFB/ MAIC, occasional hemoptysis and GERD. Post [facility]-8/1-03/27/12 for dx bronchiectasis w/ acute exacerbation, COPD, anemia/ B12 deficiency, Failure to thrive.Treated vanc/ zithromax/ ceftazidime. Cultures all no growth. Severe diffuse cystic bronchiectasis. Had ID consult. ECHO 8/2- normal EF60%, unable to visualize for PA pressures. Treated B12 deficiency x 5 days, then outpat injection.  She has not re-established with Stacy Novak/ PCP and says there is not a current record there. Air conditioning broken, so she has been staying at their [facility] place. Eating better, not gaining weight. Weak. Thick white mucus in mouth. No fever, chills, blood, pain, nodes.   04/17/12- Stacy Nov[facility]/ INF.DIS. DISEASE, PULMONARY D/T MYCOBACTERIA - Stacy Sax, Stacy Novak 04/17/2012 12:24 PM Signed  She is not doing well based on her weight loss and probable recent exacerbation. I reviewed her CXR with her, there is not a huge difference vs her previous studies from 11-2010. She is offered to restart her MAI therapy but she wants "to get her stomach straightened out". She has quit taking her prilosec/nexium and is relying on tums now.will get a repeat sputum AFB on her (she is given a specimen container and will return after she is able to provide a good specimen).  Needs improved nutrition and probably home O2 (i encouraged her multiple times to call Stacy Stacy Novak about this). We may need GI help with her stomach issues.  Will see her  back on October 3.   04/30/12-  71 yo F never smoker, followed for severe Cystic bronchiectasis/ COPD, with hx treated atypical AFB/ MAIC, occasional hemoptysis and GERD. Unable to tell any difference with using O2 QHS-does not use O2 during the day. Increased SOB-not getting any better; Unable to drink much water. Minor epistaxis blamed on dryness from her oxygen. Son thinks/blood-tinged sputum occasionally, otherwise yellowish or greenish. She had stopped Nexium, blaming it for her poor appetite-it seemed to cause bloating. Instead she now uses TUMS and we discussed his meds. Complains of burning in her feet attributed by her to B12 deficiency. I again asked her to get back with her primary physician.  05/14/12- 42 yo F never smoker, followed for severe Cystic bronchiectasis/ COPD, with hx treated atypical AFB/ MAIC, occasional hemoptysis and GERD  Husband here Sputum cx 03/22/12- Nl flora. ACUTE VISIT: increased cough-yellow in color and Increased SOB-getting worse. Cough is chronically productive. In the last few days has felt more short of breath, nonspecific. Had flu vaccine. Using saline nasal spray for nasal dryness on home oxygen at 2 L. She is taking a liquid antacid instead of Nexium and admits reduced by mouth intake. Claims foods makes her bloated CXR 03/22/12- I reviewed the images with patient and husband IMPRESSION:  No significant change in the severe chronic changes as above.  Original Report Authenticated By: Cyndie Chime, M.D.   Review of Systems-See HPI Constitutional:   + weight loss, No-night sweats, No- Fevers, chills, +fatigue, lassitude. HEENT:   No headaches,  Difficulty swallowing,  Tooth/dental problems,  Sore throat,                No sneezing, itching, ear ache, nasal congestion, post nasal drip,  CV:  No chest pain, orthopnea, PND, swelling in lower extremities, anasarca, +dizziness, No-palpitations GI  No heartburn, indigestion, abdominal pain, nausea,  vomiting, Resp: +Shortness of breath with exertion. ,  + Trace coughing up of blood. .  Some minimal wheezing.  Skin: no rash or lesions. GU:   MS:  + joint pain or swelling.   Psych:  No change in mood or affect. No depression + anxiety.  No memory loss.  Objective:   Physical Exam General- Alert, Oriented, Affect-appropriate, Distress- none acute; thin. Looks chronically ill, slumped posture. Wheelchair in our office Skin- rash-none, lesions- none, excoriation- none Lymphadenopathy- none Head- atraumatic            Eyes- Gross vision intact, PERRLA, conjunctivae clear secretions            Ears- Hearing, canals- normal            Nose- Clear, No-Septal dev, mucus, polyps, erosion, perforation             Throat- Mallampati II , mucosa clear , drainage- none, tonsils- atrophic Neck- flexible , trachea midline, no stridor , thyroid nl, carotid no bruit Chest - symmetrical excursion , unlabored           Heart/CV- + remains rapid RRR ., no murmur , no gallop  , no rub, nl s1 s2                            JVD+ full , edema- none, stasis changes- none, varices- none           Lung-  +few bilateral  Rhonchi, especially right upper lobe, unlabored, cough- mild , dullness-none, rub- none           Chest wall-  Abd-  Br/ Gen/ Rectal- Not done, not indicated Extrem- cyanosis- none, clubbing, none, atrophy- none, strength- nl Neuro- grossly intact to observation

## 2012-05-14 NOTE — Patient Instructions (Addendum)
Order- BMET-  Dx hypokalemia  Eat more!    Order- DME [employer]- add humidifier to O2 concentrator  Hang on  To the augmentin script for now.

## 2012-05-15 LAB — BASIC METABOLIC PANEL
CO2: 33 mEq/L — ABNORMAL HIGH (ref 19–32)
Calcium: 9.3 mg/dL (ref 8.4–10.5)
Chloride: 101 mEq/L (ref 96–112)
Creatinine, Ser: 0.6 mg/dL (ref 0.4–1.2)
Glucose, Bld: 101 mg/dL — ABNORMAL HIGH (ref 70–99)

## 2012-05-17 NOTE — Progress Notes (Signed)
Quick Note:  Pt aware of results. ______ 

## 2012-05-21 NOTE — Assessment & Plan Note (Signed)
We discussed options. She is approaching chronic inspiratory failure. She is too old for transplant. 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 They spend a regular time at [facility] so I asked if her brother establish doctor in the [place] area. She wishes to stay here. I offered to [facility] opinion and she said she would consider that.

## 2012-05-21 NOTE — Assessment & Plan Note (Addendum)
Sputum cultures remained negative for recurrent disease She will keep her appointment with Dr. Rowland Lathe

## 2012-05-21 NOTE — Assessment & Plan Note (Signed)
Poor by mouth intake has always been a problem. She has had frequent GI upset associated with antibiotics and has been dehydrated in the past. I have encouraged her to eat more.

## 2012-05-21 NOTE — Assessment & Plan Note (Signed)
DME/[employer] to add humidifier to her oxygen concentrator

## 2012-05-23 ENCOUNTER — Ambulatory Visit: Payer: Medicare Other | Admitting: Infectious Diseases

## 2012-05-28 ENCOUNTER — Ambulatory Visit: Payer: Medicare Other | Admitting: Infectious Diseases

## 2012-05-29 ENCOUNTER — Encounter: Payer: Self-pay | Admitting: Infectious Diseases

## 2012-05-29 ENCOUNTER — Ambulatory Visit (INDEPENDENT_AMBULATORY_CARE_PROVIDER_SITE_OTHER): Payer: Medicare Other | Admitting: Infectious Diseases

## 2012-05-29 ENCOUNTER — Ambulatory Visit (INDEPENDENT_AMBULATORY_CARE_PROVIDER_SITE_OTHER): Payer: Medicare Other

## 2012-05-29 VITALS — BP 149/76 | HR 96 | Temp 97.4°F | Wt 123.0 lb

## 2012-05-29 DIAGNOSIS — D649 Anemia, unspecified: Secondary | ICD-10-CM

## 2012-05-29 DIAGNOSIS — I10 Essential (primary) hypertension: Secondary | ICD-10-CM | POA: Insufficient documentation

## 2012-05-29 DIAGNOSIS — A310 Pulmonary mycobacterial infection: Secondary | ICD-10-CM

## 2012-05-29 NOTE — Assessment & Plan Note (Addendum)
Will try to repeat her sputum Cx. It is not clear to me whether she has relapsed MAI or a continuation of her COPD/bronchiectasis continuum. Will see her back in 3-4 months

## 2012-05-29 NOTE — Progress Notes (Signed)
  Subjective:    Patient ID: Stacy Novak, female    DOB: 03/01/1941, 71 y.o.   MRN: [id number]  HPI 71 y.o. female PMH of Cystic Bronchiectasis, MAC, COPD, GERD. Pt had multiple office visit recently and had chest X-ray on 03/19/2012 with positive findings of extensive pulmonary parenchymal architectural distortion associated air fluid levels similar to multiple prior exams, referred 03-21-12 to [facility] hospital for further evaluation. Recent treatment with Levaquin for possible Pneumonia on June/2013. She was offered Bronch in hospital but declined. Her sputum Cx was AFB (-). She was treated with azithro/ceftaz for 7 days and d/c home on 03-27-12 with no further anbx.   Today is concerned that she is loosing more weight. Has been off her HCTZ for the last week as she is worried that she is not getting enough fluid. Occas feels bloated, has tried simethecon. Has home O2 at night. Has bene eating greek yogurt. Does not like supplements taste.  Breathing is about the same. Not getting around as well. Cont cough- no hemoptysis; sputum has been white/thick. Occas loser, darker yellow. No f/c.   Had previously recieved a 1 yr course of E/B 2002. She relapsed in July of 2007 and was started on E/A/R (Cx showed MAI, S-Clofaz, Copywriter, advertising. I- Rifampin. Synergy S- E/R).     Review of Systems     Objective:   Physical Exam  Constitutional: She appears well-developed and well-nourished. No distress.  HENT:  Mouth/Throat: No oropharyngeal exudate.  Eyes: EOM are normal. Pupils are equal, round, and reactive to light.  Cardiovascular: Normal rate, regular rhythm and normal heart sounds.   Pulmonary/Chest: Effort normal. She has rhonchi in the right upper field, the right middle field, the right lower field, the left upper field, the left middle field and the left lower field.  Abdominal: Soft. Bowel sounds are normal. There is no tenderness.  Lymphadenopathy:    She has no cervical adenopathy.            Assessment & Plan:

## 2012-05-29 NOTE — Assessment & Plan Note (Signed)
She needs f/u especially since off her rx. Will try to get her in sooner.

## 2012-05-30 ENCOUNTER — Ambulatory Visit: Payer: Medicare Other

## 2012-05-30 MED ORDER — CYANOCOBALAMIN 1000 MCG/ML IJ SOLN
1000.0000 ug | Freq: Once | INTRAMUSCULAR | Status: AC
  Administered 2012-05-30: 1000 ug via INTRAMUSCULAR

## 2012-06-03 ENCOUNTER — Telehealth: Payer: Self-pay | Admitting: Internal Medicine

## 2012-06-03 MED ORDER — AMOXICILLIN-POT CLAVULANATE 500-125 MG PO TABS: 1.0000 | ORAL_TABLET | Freq: Two times a day (BID) | ORAL | Status: DC

## 2012-06-03 NOTE — Telephone Encounter (Signed)
Per CY-okay to give Augmentin 500 mg #20 take 1 po bid no refills and have patient speak with her DME company about available alternative to nasal cannula. Pt is aware and wants to pick up RX to take with her. Husband has come by and picked this up already.

## 2012-06-03 NOTE — Telephone Encounter (Signed)
I spoke with pt and she stated the Augmentin was never called in from last visit 05/14/12 for her to have on hold. Please advise directions and quantity. Thanks  2) they oxygen she uses at night has caused white bumps in both of her nostrils. She stated they don;t hurt right now. She is wanting to know if she can use something else other than the cannula. Please advise Dr. Maple Hudson

## 2012-06-26 ENCOUNTER — Ambulatory Visit (INDEPENDENT_AMBULATORY_CARE_PROVIDER_SITE_OTHER): Payer: Medicare Other

## 2012-06-26 DIAGNOSIS — D649 Anemia, unspecified: Secondary | ICD-10-CM

## 2012-06-26 MED ORDER — CYANOCOBALAMIN 1000 MCG/ML IJ SOLN
1000.0000 ug | Freq: Once | INTRAMUSCULAR | Status: AC
  Administered 2012-06-26: 1000 ug via INTRAMUSCULAR

## 2012-07-12 ENCOUNTER — Other Ambulatory Visit: Payer: Medicare Other

## 2012-07-12 ENCOUNTER — Other Ambulatory Visit: Payer: Self-pay | Admitting: Infectious Disease

## 2012-07-12 DIAGNOSIS — A310 Pulmonary mycobacterial infection: Secondary | ICD-10-CM

## 2012-07-30 ENCOUNTER — Encounter: Payer: Self-pay | Admitting: Internal Medicine

## 2012-07-30 ENCOUNTER — Ambulatory Visit (INDEPENDENT_AMBULATORY_CARE_PROVIDER_SITE_OTHER): Payer: Medicare Other

## 2012-07-30 ENCOUNTER — Ambulatory Visit (INDEPENDENT_AMBULATORY_CARE_PROVIDER_SITE_OTHER): Payer: Medicare Other | Admitting: Internal Medicine

## 2012-07-30 VITALS — BP 134/70 | HR 118 | Ht 67.0 in | Wt 121.2 lb

## 2012-07-30 DIAGNOSIS — R627 Adult failure to thrive: Secondary | ICD-10-CM

## 2012-07-30 DIAGNOSIS — D649 Anemia, unspecified: Secondary | ICD-10-CM

## 2012-07-30 DIAGNOSIS — Q334 Congenital bronchiectasis: Secondary | ICD-10-CM

## 2012-07-30 DIAGNOSIS — J471 Bronchiectasis with (acute) exacerbation: Secondary | ICD-10-CM

## 2012-07-30 DIAGNOSIS — D510 Vitamin B12 deficiency anemia due to intrinsic factor deficiency: Secondary | ICD-10-CM

## 2012-07-30 MED ORDER — AMOXICILLIN-POT CLAVULANATE 500-125 MG PO TABS: 1.0000 | ORAL_TABLET | Freq: Two times a day (BID) | ORAL | Status: DC

## 2012-07-30 NOTE — Progress Notes (Signed)
Patient ID: Stacy Novak, female    DOB: 04/23/1941, 71 y.o.   MRN: [id number]  HPI 11/30/10-71 yo F never smoker, followed for bronchiectasis/ COPD, with hx treated atypical AFB/ MAIC, occasional hemoptysis and GERD.  Last here July 29, 2010 after normal flora on sputum cultures in October. Last antibiotic was augmentin in December. She questions if she should have had longer than 1 week treatment. Daily productive cough- yellow to light green. Shortness of breath with exertion across parking lot, up steps. . She does use Flutter occasionally. . No recent blood, fever, sweats or chest pain. Trial of Daliresp caused malaise.   03/01/11-71 yo F never smoker, followed for bronchiectasis/ COPD, with hx treated atypical AFB/ MAIC, occasional hemoptysis and GERD. No acute issues "just can't breathe and walk". She has avoided the weather extremes recently by staying in.  Cough always productive, greenish. Denies fever, sweat.  CXR- 11/30/10- reviewed with her- no definite new areas, but significant cystic bronchiectasis diffusely with some air fluid levels. PFT- 12/21/10- FEV1 1.02/ 49%; FEV1/FVC 0.54; no response to BD; TLC 0.77; DLCO 0.58    Severe obstructive disease, mild restriction.  6MWT She asks to defer formal oxygen assessment till she finishes [facility] [place] her [facility]. She has less cough in last few days, doesn't feel she could leave sputum specimen, but would like script for brand-name levaquin, which works better.   05/05/11- 71 yo F never smoker, followed for bronchiectasis/ COPD, with hx treated atypical AFB/ MAIC, occasional hemoptysis and GERD. We gave (she requested brand-name) levaquin to hold last visit. She felt she probably needed it because of pleuritic right chest pain that lasted 2-3 days, self limited. She isn't sure there was any change in her daily productive cough- usually white. Maybe some fever once. She waited because she had upset GI with a  little nausea and diarrhea. Still holding the levaquin.   07/06/11-  71 yo F never smoker, followed for bronchiectasis/ COPD, with hx treated atypical AFB/ MAIC, occasional hemoptysis and GERD. Has had flu vaccine. Has not taken the Levaquin prescription we gave previously. She was taking maintenance Zithromax to suppress bronchitis. She stayed off it for 2 weeks because of GI upset with cramps, after successfully taking it for 6 weeks. She has no GI discomfort now. She is going to try resuming maintenance Zithromax, but adding a probiotic. She did not tolerate Daliresp.  11/07/11-  71 yo F never smoker, followed for bronchiectasis/ COPD, with hx treated atypical AFB/ MAIC, occasional hemoptysis and GERD. Still coughing a lot, often productive, with looser mucus. Had tried maintenance Zithromax until she got GI upset. Remains on chronic Nexium for GERD. Discussed her recent reports of cardiac conduction problems with macrolide antibiotics. (REC- next visit- look at HR, chemistry)  01/19/12- 71 yo F never smoker, followed for bronchiectasis/ COPD, with hx treated atypical AFB/ MAIC, occasional hemoptysis and GERD. Coughing up blood-bright red; would happen off and on, but only on May 30 with no repeat today , . SOB and wheezing has increased. Always has some cough which has not changed. No easy bruising or other bleeding. No fever. Previous hemoptysis 2 or 3 years ago at which time we stopped the baby aspirin.  03/19/12-  71 yo F never smoker, followed for bronchiectasis/ COPD, with hx treated atypical AFB/ MAIC, occasional hemoptysis and GERD. Having increased SOB past 3 weeks; losing weight and unsure why; no energy.. Has lost 10 lbs since last visit.  140>130lbs. COPD assessment test (CAT) score 32/40. She complains of weakness, aching joints, weight loss. 2 episodes where she passed mucus per rectum-not blood. Dry eyes, nose and mouth times one month. Bloated with eating-takes her breath. Burning  she-asks B12 shot. Jittery. Has slept up in a chair for years because of chronic midthoracic back pain has not gone back to see Dr. [name] in a long time. She asks if all of this is a side effect of Nexium. We reviewed her radiology again. CXR 01/23/12-  IMPRESSION:  Stable chronic changes with scattered air-fluid levels, as  described above.  Original Report Authenticated By: [name], M.D.   04/02/12- 71 yo F never smoker, followed for  Cystic bronchiectasis/ COPD, with hx treated atypical AFB/ MAIC, occasional hemoptysis and GERD. Post Hospital-8/1-03/27/12 for dx bronchiectasis w/ acute exacerbation, COPD, anemia/ B12 deficiency, Failure to thrive.Treated vanc/ zithromax/ ceftazidime. Cultures all no growth. Severe diffuse cystic bronchiectasis. Had ID consult. ECHO 8/2- normal EF60%, unable to visualize for PA pressures. Treated B12 deficiency x 5 days, then outpat injection.  She has not re-established with [name]/ PCP and says there is not a current record there. Air conditioning broken, so she has been staying at their [name] place. Eating better, not gaining weight. Weak. Thick white mucus in mouth. No fever, chills, blood, pain, nodes.   04/17/12- [name]/ INF.DIS. DISEASE, PULMONARY D/T MYCOBACTERIA - [name], MD 04/17/2012 12:24 PM Signed  She is not doing well based on her weight loss and probable recent exacerbation. I reviewed her CXR with her, there is not a huge difference vs her previous studies from 11-2010. She is offered to restart her MAI therapy but she wants "to get her stomach straightened out". She has quit taking her prilosec/nexium and is relying on tums now.will get a repeat sputum AFB on her (she is given a specimen container and will return after she is able to provide a good specimen).  Needs improved nutrition and probably home O2 (i encouraged her multiple times to call [name] about this). We may need GI help with her stomach issues.  Will see her  back on October 3.   04/30/12-  71 yo F never smoker, followed for severe Cystic bronchiectasis/ COPD, with hx treated atypical AFB/ MAIC, occasional hemoptysis and GERD. Unable to tell any difference with using O2 QHS-does not use O2 during the day. Increased SOB-not getting any better; Unable to drink much water. Minor epistaxis blamed on dryness from her oxygen. Son thinks/blood-tinged sputum occasionally, otherwise yellowish or greenish. She had stopped Nexium, blaming it for her poor appetite-it seemed to cause bloating. Instead she now uses TUMS and we discussed his meds. Complains of burning in her feet attributed by her to B12 deficiency. I again asked her to get back with her primary physician.  05/14/12- 42 yo F never smoker, followed for severe Cystic bronchiectasis/ COPD, with hx treated atypical AFB/ MAIC, occasional hemoptysis and GERD  Husband here Sputum cx 03/22/12- Nl flora. ACUTE VISIT: increased cough-yellow in color and Increased SOB-getting worse. Cough is chronically productive. In the last few days has felt more short of breath, nonspecific. Had flu vaccine. Using saline nasal spray for nasal dryness on home oxygen at 2 L. She is taking a liquid antacid instead of Nexium and admits reduced by mouth intake. Claims foods makes her bloated CXR 03/22/12- I reviewed the images with patient and husband IMPRESSION:  No significant change in the severe chronic changes as above.  Original Report Authenticated By: Cyndie Chime, M.D.   07/30/12- 19 yo F never smoker, followed for severe Cystic bronchiectasis/ COPD, with hx treated atypical AFB/ MAIC, occasional hemoptysis and GERD   FOLLOWS FOR: not able to eat or drink enough; sores in nose; not wearing O2 every night due to bumps in nose, feels weak and like she needs fluids. Has lost 20 lbs over past year. Always feels hot at first to keep her home about 11 which is too cold for family. Still spending most of her time down at [facility]. Always productive cough without change. Exertion makes her cough more productive. She will see Dr. Hatcher/Infectious Disease again in January. Continues oxygen 2 L per minute at night and as needed in the day. Never took last prescription for Augmentin in September because she lost the prescription. Wants to continue B12 shots. No numbness in her feet.  Review of Systems-See HPI Constitutional:   + weight loss, No-night sweats, No- Fevers, chills, +fatigue, lassitude. HEENT:   No headaches,  Difficulty swallowing,  Tooth/dental problems,  Sore throat,                No sneezing, itching, ear ache, nasal congestion, post nasal drip,  CV:  No chest pain, orthopnea, PND, swelling in lower extremities, anasarca, +dizziness, No-palpitations GI  No heartburn, indigestion, abdominal pain, nausea, vomiting, Resp: +Shortness of breath with exertion. , No- coughing up of blood. .  Some minimal wheezing.  Skin: no rash or lesions. GU:   MS:  + joint pain or swelling.   Psych:  No change in mood or affect. No depression + anxiety.  No memory loss.  Objective:   Physical Exam General- Alert, Oriented, Affect-appropriate, Distress- none acute; thin. Looks chronically ill,  Wheelchair . Skin- rash-none, lesions- none, excoriation- none Lymphadenopathy- none Head- atraumatic            Eyes- Gross vision intact, PERRLA, conjunctivae clear secretions            Ears- Hearing, canals- normal            Nose- Clear, No-Septal dev, mucus, polyps, erosion, perforation             Throat- Mallampati II , mucosa clear , drainage- none, tonsils- atrophic Neck- flexible , trachea midline, no stridor , thyroid nl, carotid no bruit Chest - symmetrical excursion , unlabored           Heart/CV- + remains rapid RRR ., no murmur , no gallop  , no rub, nl s1 s2                            JVD+ full , edema- none, stasis changes- none, varices- none           Lung-  + bilateral  rhonchi, especially right upper  lobe, unlabored, cough- mild , dullness-none, rub- none           Chest wall-  Abd-  Br/ Gen/ Rectal- Not done, not indicated Extrem- cyanosis- none, clubbing, none, atrophy- none, strength- nl Neuro- grossly intact to observation

## 2012-07-30 NOTE — Patient Instructions (Addendum)
Order- [facility]- Adult wheelchair  Script for augmentin  Order- [facility] referral to [facility] for weight gain         Dx cystic bronchiectasis  Try using your flutter device again to help clear your lungs, so you can do it more when it is convenient  Saline nasal spray, saline nasal gel, or for a few days at a time- antibiotic ointment like Neosporin. Try these for the bumps in your nose.   Ok to continue B12 inj

## 2012-08-01 DIAGNOSIS — D649 Anemia, unspecified: Secondary | ICD-10-CM

## 2012-08-01 MED ORDER — CYANOCOBALAMIN 1000 MCG/ML IJ SOLN
1000.0000 ug | Freq: Once | INTRAMUSCULAR | Status: AC
  Administered 2012-08-01: 1000 ug via INTRAMUSCULAR

## 2012-08-06 DIAGNOSIS — R627 Adult failure to thrive: Secondary | ICD-10-CM

## 2012-08-06 HISTORY — DX: Adult failure to thrive: R62.7

## 2012-08-06 NOTE — Assessment & Plan Note (Signed)
No acute change. Worrisome that she continues to lose weight. 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 Plan-reminded her to use her flutter device for pulmonary toilet. Replacement prescription for Augmentin.

## 2012-08-06 NOTE — Assessment & Plan Note (Signed)
Plan-continue vitamin B 12 injections per discussion

## 2012-08-28 ENCOUNTER — Ambulatory Visit (INDEPENDENT_AMBULATORY_CARE_PROVIDER_SITE_OTHER): Payer: Medicare Other

## 2012-08-28 DIAGNOSIS — D510 Vitamin B12 deficiency anemia due to intrinsic factor deficiency: Secondary | ICD-10-CM

## 2012-08-28 MED ORDER — CYANOCOBALAMIN 1000 MCG/ML IJ SOLN
1000.0000 ug | Freq: Once | INTRAMUSCULAR | Status: AC
  Administered 2012-08-28: 1000 ug via INTRAMUSCULAR

## 2012-09-11 ENCOUNTER — Encounter: Payer: Self-pay | Admitting: Infectious Diseases

## 2012-09-11 ENCOUNTER — Other Ambulatory Visit (INDEPENDENT_AMBULATORY_CARE_PROVIDER_SITE_OTHER): Payer: Medicare Other

## 2012-09-11 ENCOUNTER — Ambulatory Visit (INDEPENDENT_AMBULATORY_CARE_PROVIDER_SITE_OTHER): Payer: Medicare Other | Admitting: Infectious Diseases

## 2012-09-11 ENCOUNTER — Ambulatory Visit (INDEPENDENT_AMBULATORY_CARE_PROVIDER_SITE_OTHER): Payer: Medicare Other | Admitting: Internal Medicine

## 2012-09-11 ENCOUNTER — Encounter: Payer: Self-pay | Admitting: Internal Medicine

## 2012-09-11 ENCOUNTER — Ambulatory Visit (INDEPENDENT_AMBULATORY_CARE_PROVIDER_SITE_OTHER)
Admission: RE | Admit: 2012-09-11 | Discharge: 2012-09-11 | Disposition: A | Discharge status: Home / Self Care | Payer: Medicare Other | Source: Ambulatory Visit | Attending: Internal Medicine | Admitting: Internal Medicine

## 2012-09-11 VITALS — BP 124/64 | HR 125 | Ht 67.0 in | Wt 123.8 lb

## 2012-09-11 VITALS — BP 149/77 | HR 129 | Temp 97.4°F | Ht 67.0 in | Wt 122.0 lb

## 2012-09-11 DIAGNOSIS — R627 Adult failure to thrive: Secondary | ICD-10-CM

## 2012-09-11 DIAGNOSIS — J449 Chronic obstructive pulmonary disease, unspecified: Secondary | ICD-10-CM

## 2012-09-11 DIAGNOSIS — J471 Bronchiectasis with (acute) exacerbation: Secondary | ICD-10-CM

## 2012-09-11 DIAGNOSIS — D510 Vitamin B12 deficiency anemia due to intrinsic factor deficiency: Secondary | ICD-10-CM

## 2012-09-11 DIAGNOSIS — R042 Hemoptysis: Secondary | ICD-10-CM

## 2012-09-11 DIAGNOSIS — A310 Pulmonary mycobacterial infection: Secondary | ICD-10-CM

## 2012-09-11 DIAGNOSIS — I10 Essential (primary) hypertension: Secondary | ICD-10-CM

## 2012-09-11 LAB — BASIC METABOLIC PANEL
BUN: 14 mg/dL (ref 6–23)
CO2: 31 mEq/L (ref 19–32)
Calcium: 8.5 mg/dL (ref 8.4–10.5)
Chloride: 101 mEq/L (ref 96–112)
Creatinine, Ser: 0.6 mg/dL (ref 0.4–1.2)

## 2012-09-11 LAB — CBC
HCT: 43.1 % (ref 36.0–46.0)
Hemoglobin: 13.8 g/dL (ref 12.0–15.0)
Platelets: 286 10*3/uL (ref 150.0–400.0)
RDW: 16 % — ABNORMAL HIGH (ref 11.5–14.6)
WBC: 7.9 10*3/uL (ref 4.5–10.5)

## 2012-09-11 NOTE — Assessment & Plan Note (Signed)
Minor occasional streak heme consistent with her known bronchiectasis

## 2012-09-11 NOTE — Assessment & Plan Note (Signed)
Will have her seen in pulmonary clinic asap

## 2012-09-11 NOTE — Patient Instructions (Addendum)
Order- CXR-                            Dx  bronchiectasis exacerbation              Sputum culture  Routine C&S, Fungal, AFB                   Lab- CBC, BMET  Order- [facility]- Adult wheel chair                       Humidifier for her O2 concentrator  Ambulate on room air here for oximetry with exertion- desaturated to 86% on room air  Order- DME add portable O2 2 L/M, evaluate for light portable

## 2012-09-11 NOTE — Progress Notes (Signed)
  Subjective:    Patient ID: Stacy Novak, female    DOB: 1940/10/22, 72 y.o.   MRN: [id number]  HPI 72 y.o. female PMH of Cystic Bronchiectasis, MAC, COPD, GERD. Had previously recieved a 1 yr course of E/B 2002. She relapsed in July of 2007 and was started on E/A/R (Cx showed MAI, S-Clofaz, Copywriter, advertising. I- Rifampin. Synergy S- E/R).  Had chest X-ray on 03/19/2012 with positive findings of extensive pulmonary parenchymal architectural distortion associated air fluid levels similar to multiple prior exams, referred 03-21-12 to [facility] hospital for further evaluation. Treated with Levaquin for possible Pneumonia on June/2013. She was offered Bronch in hospital but declined. Her sputum Cx was AFB (-). She was treated with azithro/ceftaz for 7 days and d/c home on 03-27-12 with no further anbx.    At her previous ID visit (05-29-12), she had AFB sputum cx that was (-).  Has been more SOB. Having DOE. Not sure if this is related to her anemia/b12 deficiency (which she attributes to her PPI). Cough is dry, mostly brownish or yellow. Occas small amount of blood streaks (none in last 2 days). No fever or chills. Walking to room from waiting room, she is notably hypoxic (85%). Has taken herself off all her medications.    Review of Systems     Objective:   Physical Exam  Constitutional:       Mildly distressed. Cachectic.   HENT:  Mouth/Throat: No oropharyngeal exudate.  Eyes: EOM are normal. Pupils are equal, round, and reactive to light.  Cardiovascular:       tachycardic  Pulmonary/Chest:       Tachypneic, pleural rub heard. Mild-mod rhonchi  Abdominal: Soft. Bowel sounds are normal. There is no tenderness.          Assessment & Plan:

## 2012-09-11 NOTE — Assessment & Plan Note (Signed)
Will recheck her CXR today, as well as her sputum Cx.

## 2012-09-11 NOTE — Progress Notes (Signed)
Patient ID: Stacy Novak, female    DOB: 04/23/1941, 72 y.o.   MRN: [id number]  HPI 11/30/10-72 yo F never smoker, followed for bronchiectasis/ COPD, with hx treated atypical AFB/ MAIC, occasional hemoptysis and GERD.  Last here July 29, 2010 after normal flora on sputum cultures in October. Last antibiotic was augmentin in December. She questions if she should have had longer than 1 week treatment. Daily productive cough- yellow to light green. Shortness of breath with exertion across parking lot, up steps. . She does use Flutter occasionally. . No recent blood, fever, sweats or chest pain. Trial of Daliresp caused malaise.   03/01/11-72 yo F never smoker, followed for bronchiectasis/ COPD, with hx treated atypical AFB/ MAIC, occasional hemoptysis and GERD. No acute issues "just can't breathe and walk". She has avoided the weather extremes recently by staying in.  Cough always productive, greenish. Denies fever, sweat.  CXR- 11/30/10- reviewed with her- no definite new areas, but significant cystic bronchiectasis diffusely with some air fluid levels. PFT- 12/21/10- FEV1 1.02/ 49%; FEV1/FVC 0.54; no response to BD; TLC 0.77; DLCO 0.58    Severe obstructive disease, mild restriction.  6MWT She asks to defer formal oxygen assessment till she [facility] [place]. She has less cough in last few days, doesn't feel she could leave sputum specimen, but would like script for brand-name levaquin, which works better.   05/05/11- 72 yo F never smoker, followed for bronchiectasis/ COPD, with hx treated atypical AFB/ MAIC, occasional hemoptysis and GERD. We gave (she requested brand-name) levaquin to hold last visit. She felt she probably needed it because of pleuritic right chest pain that lasted 2-3 days, self limited. She isn't sure there was any change in her daily productive cough- usually white. Maybe some fever once. She waited because she had upset GI with a  little nausea and diarrhea. Still holding the levaquin.   07/06/11-  72 yo F never smoker, followed for bronchiectasis/ COPD, with hx treated atypical AFB/ MAIC, occasional hemoptysis and GERD. Has had flu vaccine. Has not taken the Levaquin prescription we gave previously. She was taking maintenance Zithromax to suppress bronchitis. She stayed off it for 2 weeks because of GI upset with cramps, after successfully taking it for 6 weeks. She has no GI discomfort now. She is going to try resuming maintenance Zithromax, but adding a probiotic. She did not tolerate Daliresp.  11/07/11-  72 yo F never smoker, followed for bronchiectasis/ COPD, with hx treated atypical AFB/ MAIC, occasional hemoptysis and GERD. Still coughing a lot, often productive, with looser mucus. Had tried maintenance Zithromax until she got GI upset. Remains on chronic Nexium for GERD. Discussed her recent reports of cardiac conduction problems with macrolide antibiotics. (REC- next visit- look at HR, chemistry)  01/19/12- 72 yo F never smoker, followed for bronchiectasis/ COPD, with hx treated atypical AFB/ MAIC, occasional hemoptysis and GERD. Coughing up blood-bright red; would happen off and on, but only on May 30 with no repeat today , . SOB and wheezing has increased. Always has some cough which has not changed. No easy bruising or other bleeding. No fever. Previous hemoptysis 2 or 3 years ago at which time we stopped the baby aspirin.  03/19/12-  72 yo F never smoker, followed for bronchiectasis/ COPD, with hx treated atypical AFB/ MAIC, occasional hemoptysis and GERD. Having increased SOB past 3 weeks; losing weight and unsure why; no energy.. Has lost 10 lbs since last visit.  140>130lbs. COPD assessment test (CAT) score 32/40. She complains of weakness, aching joints, weight loss. 2 episodes where she passed mucus per rectum-not blood. Dry eyes, nose and mouth times one month. Bloated with eating-takes her breath. Burning  she-asks B12 shot. Jittery. Has slept up in a chair for years because of chronic midthoracic back pain has not gone back to see Dr. [name] in a long time. She asks if all of this is a side effect of Nexium. We reviewed her radiology again. CXR 01/23/12-  IMPRESSION:  Stable chronic changes with scattered air-fluid levels, as  described above.  Original Report Authenticated By: [name], M.D.   04/02/12- 72 yo F never smoker, followed for  Cystic bronchiectasis/ COPD, with hx treated atypical AFB/ MAIC, occasional hemoptysis and GERD. [facility]03[facility]27[facility]12 for dx bronchiectasis w/ acute exacerbation, COPD, anemia/ B12 deficiency, Failure to thrive.Treated vanc/ zithromax/ ceftazidime. Cultures all no growth. Severe diffuse cystic bronchiectasis. Had ID consult. ECHO 8/2- normal EF60%, unable to visualize for PA pressures. Treated B12 deficiency x 5 days, then outpat injection.  She has not re-established with [name]/ PCP and says there is not a current record there. Air conditioning broken, so she has been staying at their [name] place. Eating better, not gaining weight. Weak. Thick white mucus in mouth. No fever, chills, blood, pain, nodes.   04/17/12- [name]/ INF.DIS. DISEASE, PULMONARY D/T MYCOBACTERIA - [name], MD 04/17/2012 12:24 PM Signed  She is not doing well based on her weight loss and probable recent exacerbation. I reviewed her CXR with her, there is not a huge difference vs her previous studies from 11-2010. She is offered to restart her MAI therapy but she wants "to get her stomach straightened out". She has quit taking her prilosec/nexium and is relying on tums now.will get a repeat sputum AFB on her (she is given a specimen container and will return after she is able to provide a good specimen).  Needs improved nutrition and probably home O2 (i encouraged her multiple times to call [name] about this). We may need GI help with her stomach issues.  Will see her  back on October 3.   04/30/12-  72 yo F never smoker, followed for severe Cystic bronchiectasis/ COPD, with hx treated atypical AFB/ MAIC, occasional hemoptysis and GERD. Unable to tell any difference with using O2 QHS-does not use O2 during the day. Increased SOB-not getting any better; Unable to drink much water. Minor epistaxis blamed on dryness from her oxygen. Son thinks/blood-tinged sputum occasionally, otherwise yellowish or greenish. She had stopped Nexium, blaming it for her poor appetite-it seemed to cause bloating. Instead she now uses TUMS and we discussed his meds. Complains of burning in her feet attributed by her to B12 deficiency. I again asked her to get back with her primary physician.  05/14/12- 42 yo F never smoker, followed for severe Cystic bronchiectasis/ COPD, with hx treated atypical AFB/ MAIC, occasional hemoptysis and GERD  Husband here Sputum cx 03/22/12- Nl flora. ACUTE VISIT: increased cough-yellow in color and Increased SOB-getting worse. Cough is chronically productive. In the last few days has felt more short of breath, nonspecific. Had flu vaccine. Using saline nasal spray for nasal dryness on home oxygen at 2 L. She is taking a liquid antacid instead of Nexium and admits reduced by mouth intake. Claims foods makes her bloated CXR 03/22/12- I reviewed the images with patient and husband IMPRESSION:  No significant change in the severe chronic changes as above.  Original Report Authenticated By: Cyndie Chime, M.D.   07/30/12- 51 yo F never smoker, followed for severe Cystic bronchiectasis/ COPD, with hx treated atypical AFB/ MAIC, occasional hemoptysis and GERD   FOLLOWS FOR: not able to eat or drink enough; sores in nose; not wearing O2 every night due to bumps in nose, feels weak and like she needs fluids. Has lost 20 lbs over past year. Always feels hot at first to keep her home about 56 which is too cold for family. Still spending most of her time [facility] at [facility]. Always productive cough without change. Exertion makes her cough more productive. She will see Dr. Hatcher/Infectious Disease again in January. Continues oxygen 2 L per minute at night and as needed in the day. Never took last prescription for Augmentin in September because she lost the prescription. Wants to continue B12 shots. No numbness in her feet.  09/11/12- 84 yo F never smoker, followed for severe Cystic bronchiectasis/ COPD, with hx treated atypical AFB/ MAIC, occasional hemoptysis and GERD   ACUTE: 86% on RA on arrival-- Dr. Ninetta Lights ID saw her today and thought she should be seen, heard "rubbing" in her lungs today--diff breathing worsening over last "couple" weeks w even small activities-- dry cough, denies any wheezing or chest tightness.  Exercise tolerance always poor. No distinct event but may be more DOE in last 2 weeks. No change in chronic cough, sometimes productive of yellow green, occasional small streak of blood. No fever, nodes, palpitation or chest pain. Feet swell some, dependent. Very weak. Dyspnea and weakness caused by her lung disease interfere with routine activities- dressing, grooming, ambulating through home. She needs to be able to sit frequently, and needs assistance for sustained ambulation,. She should have a light wheel chair. I don't think she is strong enough to manage a regular weight chair by herself. .  Weight stable 121-123 since October. She didn't take augmentin when given because she says she wasn't taking enough fluids for it- discussed. Has O2 concentrator for sleep but tends to leave it off- prongs bother nose.    Review of Systems-See HPI Constitutional:   No recent weight loss, No-night sweats, No- Fevers, chills, +fatigue, lassitude. HEENT:   No headaches,  Difficulty swallowing,  Tooth/dental problems,  Sore throat,                No sneezing, itching, ear ache, nasal congestion, post nasal drip,  CV:  No chest pain, orthopnea, PND, swelling  in lower extremities, anasarca, +dizziness, No-palpitations GI  No heartburn, indigestion, abdominal pain, nausea, vomiting, Resp: +Shortness of breath with exertion. , +slight coughing up of blood. .  Some minimal wheezing.  Skin: no rash or lesions. GU:   MS:  + joint pain or swelling.   Psych:  No change in mood or affect. No depression + anxiety.  No memory loss.  Objective:   Physical Exam BP 124/64  Pulse 125  Ht 5\' 7"  (1.702 m)  Wt 123 lb 12.8 oz (56.155 kg)  BMI 19.39 kg/m2  SpO2 90% 86% on first arrival walking to exam room, 90% room air at rest, 86 % after walking 1 lap around office floor. General- Alert, Oriented, Affect-appropriate, Distress- none acute; thin. Looks chronically ill,   Skin- rash-none, lesions- none, excoriation- none Lymphadenopathy- none Head- atraumatic            Eyes- Gross vision intact, PERRLA, conjunctivae clear secretions  Ears- Hearing, canals- normal            Nose- Clear, No-Septal dev, mucus, polyps, erosion, perforation             Throat- Mallampati II , mucosa clear , drainage- none, tonsils- atrophic Neck- flexible , trachea midline, no stridor , thyroid nl, carotid no bruit Chest - symmetrical excursion , unlabored           Heart/CV- + remains rapid RRR ., no murmur , no gallop  , no rub, nl s1 s2                            JVD+ full , edema- none, stasis changes- none, varices- none           Lung-  + bilateral coarse rhonchi, especially right upper lobe, unlabored, cough- mild , dullness-none, rub- none           Chest wall-  Abd-  Br/ Gen/ Rectal- Not done, not indicated Extrem- cyanosis- none, clubbing, none, atrophy- none, strength- nl Neuro- grossly intact to observation

## 2012-09-11 NOTE — Assessment & Plan Note (Signed)
Needs to f/u with PCP asap

## 2012-09-11 NOTE — Assessment & Plan Note (Signed)
Plan- ordering portable O2. Already has concentrator. Talked with her about comfort issues- she needs to talk with DME about comfort of cannula>? Pediatric size. Can use otc nasal saline gel. May need portable concentrator, humidifer for main concentrator.  P- CXR, sputum culture.

## 2012-09-11 NOTE — Assessment & Plan Note (Addendum)
Weight loss leveled off, but po intake is unreliable. She notes early satiety, minimizes fluids. Will update CBC, BMET

## 2012-09-11 NOTE — Assessment & Plan Note (Signed)
Continues B12.  Plan- CBC

## 2012-09-13 ENCOUNTER — Telehealth: Payer: Self-pay | Admitting: Internal Medicine

## 2012-09-13 ENCOUNTER — Ambulatory Visit: Payer: Medicare Other | Admitting: Internal Medicine

## 2012-09-13 NOTE — Addendum Note (Signed)
Addended by: Ronny Bacon on: 09/13/2012 09:53 AM   Modules accepted: Orders

## 2012-09-13 NOTE — Telephone Encounter (Signed)
Notes Recorded by Waymon Budge, MD on 09/11/2012 at 3:50 PM Hemoglobin has come up with B12- continue the B12 shots and try to eat enough of a balanced diet.  Chemistry is ok.  Notes Recorded by Waymon Budge, MD on 09/11/2012 at 3:51 PM CXR- the lung damage from her bronchiectasis may be a little worse. I recommend that she take that augmentin antibiotic she has been holding.  Pt advised. Carron Curie, CMA

## 2012-09-16 ENCOUNTER — Telehealth: Payer: Self-pay | Admitting: Internal Medicine

## 2012-09-17 MED ORDER — AMOXICILLIN-POT CLAVULANATE 875-125 MG PO TABS: 1.0000 | ORAL_TABLET | Freq: Two times a day (BID) | ORAL | Status: DC

## 2012-09-17 NOTE — Telephone Encounter (Signed)
Ok  Augmentin 875 mg, # 14, 1 twice daily

## 2012-09-17 NOTE — Telephone Encounter (Signed)
Rx has been sent in per CY, pt is aware. 

## 2012-09-17 NOTE — Telephone Encounter (Signed)
I spoke with pt and she stated she lost the RX for augmentin. She will need new one called in. Please advise directions. Per CXR results she was to start taking Augmentin she had on hand. please advise Dr. Maple Hudson thanks Last OV 09/11/12 Allergies  Allergen Reactions  . Daliresp (Roflumilast)     "messed with my head"  . Sulfonamide Derivatives     REACTION: nausea

## 2012-09-20 ENCOUNTER — Telehealth: Payer: Self-pay | Admitting: Internal Medicine

## 2012-09-20 MED ORDER — PROMETHAZINE HCL 25 MG RE SUPP: 25.0000 mg | Freq: Two times a day (BID) | RECTAL | Status: DC | PRN

## 2012-09-20 NOTE — Telephone Encounter (Signed)
Pt states that she is not currently having diarrhea but her stomach feels upset. I made sure that she is taking the antibiotic with food and she is.  She is wanting CY recommendations as to what to do over the weekend if she does get diarrhea from taking Augmentin 875. Does she take an antidiarrheal or stop the medicine all together?  Please advise. Thanks!

## 2012-09-20 NOTE — Telephone Encounter (Signed)
1) Start a probiotic like Florastor 2) Can take peptobismol or kaopectate 2 hours after augmentin dose

## 2012-09-20 NOTE — Telephone Encounter (Signed)
I spoke with pt. She was confused by her d/c instructions of Augmentin. She stated on her medlist it stated it was d/c'd but rx was sent to the pharmacy. I advised her according to her CXR he stated she needed to take an abx. She voiced her understanding and needed nothing further

## 2012-09-20 NOTE — Telephone Encounter (Signed)
Pt is aware of CY recs. She is wanting some phenergan suppositories to have on hand, per CY this is fine.

## 2012-09-25 ENCOUNTER — Ambulatory Visit (INDEPENDENT_AMBULATORY_CARE_PROVIDER_SITE_OTHER): Payer: Medicare Other

## 2012-09-25 DIAGNOSIS — D649 Anemia, unspecified: Secondary | ICD-10-CM

## 2012-09-27 MED ORDER — CYANOCOBALAMIN 1000 MCG/ML IJ SOLN
1000.0000 ug | Freq: Once | INTRAMUSCULAR | Status: AC
  Administered 2012-09-27: 1000 ug via INTRAMUSCULAR

## 2012-10-14 ENCOUNTER — Ambulatory Visit: Payer: Medicare Other | Admitting: Infectious Diseases

## 2012-10-23 ENCOUNTER — Ambulatory Visit: Payer: Medicare Other

## 2012-10-25 ENCOUNTER — Ambulatory Visit: Payer: Medicare Other | Admitting: Internal Medicine

## 2012-10-25 ENCOUNTER — Ambulatory Visit: Payer: Medicare Other

## 2012-10-29 ENCOUNTER — Ambulatory Visit (INDEPENDENT_AMBULATORY_CARE_PROVIDER_SITE_OTHER): Payer: Medicare Other

## 2012-10-29 ENCOUNTER — Ambulatory Visit (INDEPENDENT_AMBULATORY_CARE_PROVIDER_SITE_OTHER): Payer: Medicare Other | Admitting: Internal Medicine

## 2012-10-29 ENCOUNTER — Encounter: Payer: Self-pay | Admitting: Internal Medicine

## 2012-10-29 VITALS — BP 140/72 | HR 114 | Ht 67.0 in | Wt 123.0 lb

## 2012-10-29 DIAGNOSIS — J479 Bronchiectasis, uncomplicated: Secondary | ICD-10-CM

## 2012-10-29 DIAGNOSIS — R627 Adult failure to thrive: Secondary | ICD-10-CM

## 2012-10-29 NOTE — Patient Instructions (Addendum)
Order- DME [facility]- O2 order should read 2-3 l/min continuous and portable. Evaluate for light portable.   Dx chronic bronchiectasis, chronic hypoxic respiratory failure.

## 2012-10-29 NOTE — Progress Notes (Signed)
Patient ID: Randalyn RheaJean B Savoia, female    DOB: 04/23/1941, 72 y.o.   MRN: [id number]  HPI 11/30/10-72 yo F never smoker, followed for bronchiectasis/ COPD, with hx treated atypical AFB/ MAIC, occasional hemoptysis and GERD.  Last here July 29, 2010 after normal flora on sputum cultures in October. Last antibiotic was augmentin in December. She questions if she should have had longer than 1 week treatment. Daily productive cough- yellow to light green. Shortness of breath with exertion across parking lot, up steps. . She does use Flutter occasionally. . No recent blood, fever, sweats or chest pain. Trial of Daliresp caused malaise.   03/01/11-72 yo F never smoker, followed for bronchiectasis/ COPD, with hx treated atypical AFB/ MAIC, occasional hemoptysis and GERD. No acute issues "just can't breathe and walk". She has avoided the weather extremes recently by staying in.  Cough always productive, greenish. Denies fever, sweat.  CXR- 11/30/10- reviewed with her- no definite new areas, but significant cystic bronchiectasis diffusely with some air fluid levels. PFT- 12/21/10- FEV1 1.02/ 49%; FEV1/FVC 0.54; no response to BD; TLC 0.77; DLCO 0.58    Severe obstructive disease, mild restriction.  6MWT She asks to defer formal oxygen assessment till she finishes [facility] [place] [facility]. She has less cough in last few days, doesn't feel she could leave sputum specimen, but would like script for brand-name levaquin, which works better.   05/05/11- 72 yo F never smoker, followed for bronchiectasis/ COPD, with hx treated atypical AFB/ MAIC, occasional hemoptysis and GERD. We gave (she requested brand-name) levaquin to hold last visit. She felt she probably needed it because of pleuritic right chest pain that lasted 2-3 days, self limited. She isn't sure there was any change in her daily productive cough- usually white. Maybe some fever once. She waited because she had upset GI with a  little nausea and diarrhea. Still holding the levaquin.   07/06/11-  72 yo F never smoker, followed for bronchiectasis/ COPD, with hx treated atypical AFB/ MAIC, occasional hemoptysis and GERD. Has had flu vaccine. Has not taken the Levaquin prescription we gave previously. She was taking maintenance Zithromax to suppress bronchitis. She stayed off it for 2 weeks because of GI upset with cramps, after successfully taking it for 6 weeks. She has no GI discomfort now. She is going to try resuming maintenance Zithromax, but adding a probiotic. She did not tolerate Daliresp.  11/07/11-  72 yo F never smoker, followed for bronchiectasis/ COPD, with hx treated atypical AFB/ MAIC, occasional hemoptysis and GERD. Still coughing a lot, often productive, with looser mucus. Had tried maintenance Zithromax until she got GI upset. Remains on chronic Nexium for GERD. Discussed her recent reports of cardiac conduction problems with macrolide antibiotics. (REC- next visit- look at HR, chemistry)  01/19/12- 72 yo F never smoker, followed for bronchiectasis/ COPD, with hx treated atypical AFB/ MAIC, occasional hemoptysis and GERD. Coughing up blood-bright red; would happen off and on, but only on May 30 with no repeat today , . SOB and wheezing has increased. Always has some cough which has not changed. No easy bruising or other bleeding. No fever. Previous hemoptysis 2 or 3 years ago at which time we stopped the baby aspirin.  03/19/12-  72 yo F never smoker, followed for bronchiectasis/ COPD, with hx treated atypical AFB/ MAIC, occasional hemoptysis and GERD. Having increased SOB past 3 weeks; losing weight and unsure why; no energy.. Has lost 10 lbs since last visit.  140>130lbs. COPD assessment test (CAT) score 32/40. She complains of weakness, aching joints, weight loss. 2 episodes where she passed mucus per rectum-not blood. Dry eyes, nose and mouth times one month. Bloated with eating-takes her breath. Burning  she-asks B12 shot. Jittery. Has slept up in a chair for years because of chronic midthoracic back pain has not gone back to see Dr. [name] in a long time. She asks if all of this is a side effect of Nexium. We reviewed her radiology again. CXR 01/23/12-  IMPRESSION:  Stable chronic changes with scattered air-fluid levels, as  described above.  Original Report Authenticated By: [name], M.D.   04/02/12- 72 yo F never smoker, followed for  Cystic bronchiectasis/ COPD, with hx treated atypical AFB/ MAIC, occasional hemoptysis and GERD. [facility]03/27[facility]12 for dx bronchiectasis w/ acute exacerbation, COPD, anemia/ B12 deficiency, Failure to thrive.Treated vanc/ zithromax/ ceftazidime. Cultures all no growth. Severe diffuse cystic bronchiectasis. Had ID consult. ECHO 8/2- normal EF60%, unable to visualize for PA pressures. Treated B12 deficiency x 5 days, then outpat injection.  She has not re-established with [name]/ PCP and says there is not a current record there. Air conditioning broken, so she has been staying at their [name] place. Eating better, not gaining weight. Weak. Thick white mucus in mouth. No fever, chills, blood, pain, nodes.   04/17/12- [name]/ INF.DIS. DISEASE, PULMONARY D/T MYCOBACTERIA - [name], MD 04/17/2012 12:24 PM Signed  She is not doing well based on her weight loss and probable recent exacerbation. I reviewed her CXR with her, there is not a huge difference vs her previous studies from 11-2010. She is offered to restart her MAI therapy but she wants "to get her stomach straightened out". She has quit taking her prilosec/nexium and is relying on tums now.will get a repeat sputum AFB on her (she is given a specimen container and will return after she is able to provide a good specimen).  Needs improved nutrition and probably home O2 (i encouraged her multiple times to call [name] about this). We may need GI help with her stomach issues.  Will see her  back on October 3.   04/30/12-  72 yo F never smoker, followed for severe Cystic bronchiectasis/ COPD, with hx treated atypical AFB/ MAIC, occasional hemoptysis and GERD. Unable to tell any difference with using O2 QHS-does not use O2 during the day. Increased SOB-not getting any better; Unable to drink much water. Minor epistaxis blamed on dryness from her oxygen. Son thinks/blood-tinged sputum occasionally, otherwise yellowish or greenish. She had stopped Nexium, blaming it for her poor appetite-it seemed to cause bloating. Instead she now uses TUMS and we discussed his meds. Complains of burning in her feet attributed by her to B12 deficiency. I again asked her to get back with her primary physician.  05/14/12- 42 yo F never smoker, followed for severe Cystic bronchiectasis/ COPD, with hx treated atypical AFB/ MAIC, occasional hemoptysis and GERD  Husband here Sputum cx 03/22/12- Nl flora. ACUTE VISIT: increased cough-yellow in color and Increased SOB-getting worse. Cough is chronically productive. In the last few days has felt more short of breath, nonspecific. Had flu vaccine. Using saline nasal spray for nasal dryness on home oxygen at 2 L. She is taking a liquid antacid instead of Nexium and admits reduced by mouth intake. Claims foods makes her bloated CXR 03/22/12- I reviewed the images with patient and husband IMPRESSION:  No significant change in the severe chronic changes as above.  Original Report Authenticated By: Cyndie Chime, M.D.   07/30/12- 57 yo F never smoker, followed for severe Cystic bronchiectasis/ COPD, with hx treated atypical AFB/ MAIC, occasional hemoptysis and GERD   FOLLOWS FOR: not able to eat or drink enough; sores in nose; not wearing O2 every night due to bumps in nose, feels weak and like she needs fluids. Has lost 20 lbs over past year. Always feels hot at first to keep her home about 47 which is too cold for family. Still spending most of her time down at [facility] Always productive cough without change. Exertion makes her cough more productive. She will see Dr. Hatcher/Infectious Disease again in January. Continues oxygen 2 L per minute at night and as needed in the day. Never took last prescription for Augmentin in September because she lost the prescription. Wants to continue B12 shots. No numbness in her feet.  09/11/12- 18 yo F never smoker, followed for severe Cystic bronchiectasis/ COPD, with hx treated atypical AFB/ MAIC, occasional hemoptysis and GERD   ACUTE: 86% on RA on arrival-- Dr. Ninetta Lights ID saw her today and thought she should be seen, heard "rubbing" in her lungs today--diff breathing worsening over last "couple" weeks w even small activities-- dry cough, denies any wheezing or chest tightness.  Exercise tolerance always poor. No distinct event but may be more DOE in last 2 weeks. No change in chronic cough, sometimes productive of yellow green, occasional small streak of blood. No fever, nodes, palpitation or chest pain. Feet swell some, dependent. Very weak. Dyspnea and weakness caused by her lung disease interfere with routine activities- dressing, grooming, ambulating through home. She needs to be able to sit frequently, and needs assistance for sustained ambulation,. She should have a light wheel chair. I don't think she is strong enough to manage a regular weight chair by herself. .  Weight stable 121-123 since October. She didn't take augmentin when given because she says she wasn't taking enough fluids for it- discussed. Has O2 concentrator for sleep but tends to leave it off- prongs bother nose.   10/29/12- 76 yo F never smoker, followed for severe Cystic bronchiectasis/ COPD, with hx treated atypical AFB/ MAIC, occasional hemoptysis and GERD FOLLOWS FOR: Pt states she has not recently been wearing her O2 at night as she feels it makes her breathing worse during the day; States she has noticed increased SOB as well. 85% RA on  arrival-took approximately 2 minutes to reach 90% RA. She finally took the antibiotic we had given her. She stays short of breath but not wearing oxygen. Her portable tank is too heavy for her. We discussed alternatives including small concentrators. She has generally sought to minimize intervention and treatment. 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  Numerous air-fluid levels appear progressive and could be due to  infection.  Original Report Authenticated By: Rudie Meyer, M.D.   Review of Systems-See HPI Constitutional:   stabilized weight loss, No-night sweats, No- Fevers, chills, +fatigue, lassitude. HEENT:   No headaches,  Difficulty swallowing,  Tooth/dental problems,  Sore throat,                No sneezing, itching, ear ache, nasal congestion, post nasal drip,  CV:  No chest pain, orthopnea, PND, swelling in lower extremities, anasarca, +dizziness, No-palpitations GI  No heartburn, indigestion, abdominal pain, nausea, vomiting, Resp: +Shortness of breath with exertion. , +slight coughing up of blood. .  Some minimal wheezing.  Skin: no rash or lesions. GU:   MS:  + joint pain or swelling.   Psych:  No change in mood or affect. No depression + anxiety.  No memory loss.  Objective:   Physical Exam  General- Alert, Oriented, Affect-appropriate, Distress- none acute; thin. Looks chronically ill,   Skin- rash-none, lesions- none, excoriation- none. +Dusky hands and feet Lymphadenopathy- none Head- atraumatic            Eyes- Gross vision intact, PERRLA, conjunctivae clear secretions            Ears- Hearing, canals- normal            Nose- Clear, No-Septal dev, mucus, polyps, erosion, perforation             Throat- Mallampati II , mucosa clear , drainage- none, tonsils- atrophic Neck- flexible , trachea midline, no stridor , thyroid nl, carotid no bruit Chest - symmetrical excursion , unlabored           Heart/CV- + remains rapid  RRR ., no murmur , no gallop  , no rub, nl s1 s2                            JVD+ full , edema +1, stasis changes- none, varices- none           Lung-  + bilateral coarse rhonchi, especially lateral left, unlabored, cough- mild , dullness-none, rub- none           Chest wall-  Abd-  Br/ Gen/ Rectal- Not done, not indicated Extrem- cyanosis- none, clubbing, none, atrophy- none, strength- nl Neuro- grossly intact to observation

## 2012-10-30 MED ORDER — CYANOCOBALAMIN 1000 MCG/ML IJ SOLN
1000.0000 ug | Freq: Once | INTRAMUSCULAR | Status: AC
  Administered 2012-10-30: 1000 ug via INTRAMUSCULAR

## 2012-11-01 ENCOUNTER — Encounter: Payer: Self-pay | Admitting: Internal Medicine

## 2012-11-01 NOTE — Assessment & Plan Note (Addendum)
Compliance is an issue. She is tending to avoid antibiotics and oxygen. Plan-continue oxygen and portable oxygen 24/7. Evaluated for light portable.

## 2012-11-01 NOTE — Assessment & Plan Note (Signed)
Condition remains tenuous. She usually comes alone but I have tried to make sure family understood her poor condition.

## 2012-11-13 ENCOUNTER — Ambulatory Visit (INDEPENDENT_AMBULATORY_CARE_PROVIDER_SITE_OTHER): Payer: Medicare Other | Admitting: Internal Medicine

## 2012-11-13 ENCOUNTER — Encounter: Payer: Self-pay | Admitting: Internal Medicine

## 2012-11-13 VITALS — BP 122/70 | HR 127 | Temp 97.8°F | Ht 67.0 in | Wt 121.4 lb

## 2012-11-13 DIAGNOSIS — J479 Bronchiectasis, uncomplicated: Secondary | ICD-10-CM

## 2012-11-13 DIAGNOSIS — R627 Adult failure to thrive: Secondary | ICD-10-CM

## 2012-11-13 DIAGNOSIS — Z1239 Encounter for other screening for malignant neoplasm of breast: Secondary | ICD-10-CM

## 2012-11-13 DIAGNOSIS — D519 Vitamin B12 deficiency anemia, unspecified: Secondary | ICD-10-CM | POA: Insufficient documentation

## 2012-11-13 DIAGNOSIS — K219 Gastro-esophageal reflux disease without esophagitis: Secondary | ICD-10-CM

## 2012-11-13 DIAGNOSIS — I10 Essential (primary) hypertension: Secondary | ICD-10-CM

## 2012-11-13 NOTE — Assessment & Plan Note (Signed)
Reports use of Nexium for over 10 years, discontinued in May 2013 because of concerns with malabsorption related to new diagnosis pernicious anemia and B12 deficiency. Currently uses TUMS as needed for indigestion. Reports early satiety and but as contributors to decreased appetite and weight loss. Encouraged GI evaluation for same which she declines. As above, if continued weight loss trend in next 3 months despite the best efforts on calorie consumption, we'll again revisit importance of GI endoscopy. Also encouraged use of H2 blocker when necessary persisting symptoms uncontrolled by [facility]

## 2012-11-13 NOTE — Assessment & Plan Note (Signed)
Chronic hypoxic respiratory failure related to same. History of MAI infection. Follows with pulmonary related to same but has declined medications or oxygen therapy. Daily dyspnea on exertion symptoms, but denies recent exacerbation History including last pulmonary note reviewed in depth today.

## 2012-11-13 NOTE — Assessment & Plan Note (Signed)
Associated with unintentional weight loss, >20 pounds in past 6 months  Has declined evaluation for malignancy as she believes her weight loss is related to poor intake/insufficient calories I've encouraged her to keep a food journal and aim for 2000 calories each day If continued declining weight in 3 months time, will again encourage GI evaluation, colon cancer screening and mammography - patient understands and agrees

## 2012-11-13 NOTE — Progress Notes (Signed)
Subjective:    Patient ID: Stacy Novak, female    DOB: 12/15/1940, 72 y.o.   MRN: [id number]  HPI New patient to me and our division, here today to establish care -previously followed with Dr. [name] at [facility] Reviewed chronic medical issues today:  Bronchiectasis - history of MAI with frequent and recurrent exacerbations; chronic dyspnea on exertion - overlap with COPD and asthma reviewed - follows regularly with pulmonary for same - the patient reports compliance with medication(s) as prescribed. Denies adverse side effects.  History of tuberculosis - 2002 diagnosis, relapse July 2007. Status post treatment by infectious disease - no evidence of recurrence at this time - symptoms compensated by chronic pulmonary issues as noted above  Hypertension - the patient reports Variable compliance with medication prescribed. Denies adverse side effects.  GERD - previously on daily PPI therapy for over 10 years. Self discontinued Nexium may 2013 in the setting of new diagnosis B12 deficiency and pernicious anemia. Currently reports early satiety and bloating as greater discomfort than indigestion or reflux. Uses TUMS as needed. Has declined GI evaluation  Past Medical History  Diagnosis Date  . 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 COPD (chronic obstructive pulmonary disease)   . Bronchiectasis   . Hypercalcemia   . GERD (gastroesophageal reflux disease)   . Hypertension   . Asthma     hx of PNA  . Adult failure to thrive 08/06/2012    20 pound weight loss one year, anorexia, pernicious anemia   . 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 CXR 01/23/12- again shows essentially stable severe fibro--cystic scarring with bronchiectasis and air fluid levels, but no obvious progression. Desat to 86% with exertion walking room air- order portable O2   . GERD   . Pernicious anemia    Family History   Problem Relation Age of Onset  . Hypertension    . Stroke Father   . Alzheimer's disease Mother   . Atrial fibrillation Mother   . Seizures Son    History  Substance Use Topics  . Smoking status: Never Smoker   . Smokeless tobacco: Never Used  . Alcohol Use: No    Review of Systems Constitutional: Negative for fever; + unintended weight change over past 6-46mo.  Respiratory: Negative for cough and shortness of breath.   Cardiovascular: Negative for chest pain or palpitations.  Gastrointestinal: Negative for abdominal pain, no bowel changes. + bloat and early satiety  Musculoskeletal: Negative for gait problem or joint swelling.  Skin: Negative for rash.  Neurological: Negative for dizziness or headache.  No other specific complaints in a complete review of systems (except as listed in HPI above).     Objective:   Physical Exam BP 122/70  Pulse 127  Temp(Src) 97.8 F (36.6 C) (Oral)  Ht 5\' 7"  (1.702 m)  Wt 121 lb 6.4 oz (55.067 kg)  BMI 19.01 kg/m2  SpO2 90% Wt Readings from Last 3 Encounters:  11/13/12 121 lb 6.4 oz (55.067 kg)  10/29/12 123 lb (55.792 kg)  09/11/12 123 lb 12.8 oz (56.155 kg)   Constitutional: She is thin, but appears well-developed and well-nourished. No distress.  HENT: Head: Normocephalic and atraumatic. Ears: B TMs ok, no erythema or effusion; Nose: Nose normal. Mouth/Throat: Oropharynx is clear and moist. No oropharyngeal exudate.  Eyes: Conjunctivae and EOM are normal. Pupils are equal, round, and reactive to light. No scleral icterus.  Neck:  Normal range of motion. Neck supple. No JVD or LAD present. No thyromegaly present.  Cardiovascular: Tachy rate, regular rhythm and normal heart sounds.  No murmur heard. No BLE edema. Pulmonary/Chest: Effort normal and breath sounds diminished bilaterally. No respiratory distress. She has no wheezes.  Abdominal: Soft. Bowel sounds are normal. She exhibits no distension. There is no tenderness. no  masses Musculoskeletal: Normal range of motion, no joint effusions. No gross deformities Neurological: She is alert and oriented to person, place, and time. No cranial nerve deficit. Coordination, speech, balance and recall are normal.  Skin: Skin is warm and dry. No rash noted. No erythema.  Psychiatric: She has a normal mood and affect. Her behavior is normal. Judgment and thought content fair.   Lab Results  Component Value Date   WBC 7.9 09/11/2012   HGB 13.8 09/11/2012   HCT 43.1 09/11/2012   PLT 286.0 09/11/2012   GLUCOSE 105* 09/11/2012   ALT 9 03/19/2012   AST 22 03/19/2012   NA 138 09/11/2012   K 4.2 09/11/2012   CL 101 09/11/2012   CREATININE 0.6 09/11/2012   BUN 14 09/11/2012   CO2 31 09/11/2012   TSH 1.072 03/21/2012   INR 1.1* 01/19/2012   HGBA1C 6.5* 03/21/2012   Lab Results  Component Value Date   VITAMINB12 154* 03/19/2012      Assessment & Plan:   See problem list. Medications and labs reviewed today.  Time spent with pt today 60 minutes, greater than 50% time spent counseling patient on weight loss, GERD hx, b12 def anemia, COPD and hypertension + medication review. Also review of prior records

## 2012-11-13 NOTE — Patient Instructions (Signed)
It was good to see you today. We have reviewed your prior records including labs and tests today Medications reviewed, no changes at this time. Consider colonoscopy to look for problems causing your weight loss - we will refer next visit if you are still losing weight! Please schedule followup in 3 months, call sooner if problems. Bronchiectasis Bronchiectasis is the destruction and widening of the large airways caused by mucous blockage. It is one of the chronic obstructive pulmonary diseases (COPD). It is often complicated by bronchitis and emphysema. You can be born with it (congenital bronchiectasis). It also may develop later in life. When the lungs cannot get rid of mucus, it gathers in the airways. The blockage leads to infection. This causes a soreness (inflammation) in the air passageways. The inflammation causes the air passageways to weaken and widen. The weakened passages can then become scarred and deformed. This begins a cycle which may continue to worsen. CAUSES   Bronchiectasis often begins in childhood and in the [place] about half of these cases come from cystic fibrosis.  Recurrent lung infections are another common cause.  Abnormal lung defenses a person is born.  Foreign bodies or other blockage in the lungs are other causes. Breathing in food particles regularly while eating is an example.  It is also caused from a birth defect in the lining of the lungs.  SYMPTOMS  Symptoms vary from patient to patient but can include:  Coughing which is worse lying down.  Shortness of breath and wheezing.  Weakness, weight loss, and fatigue.  With infections the mucus may be discolored, contain blood and smell badly. DIAGNOSIS  Tests for diagnosing bronchiectasis may include:  Chest X-rays or CT scans.  Breathing tests which tell your caregiver how your lungs are working.  Sputum cultures to check for infection.  Blood testing and tests for other related diseases or causes  such as cystic fibrosis or tuberculosis may be done. HOME CARE INSTRUCTIONS   Cough suppressants may be used if you cannot rest; however cough is a protective mechanism for clearing our lungs and is one reason for avoiding cough suppressants if able as they take away this protection.  Your caregiver may prescribe an expectorant to loosen the mucus to be coughed up.  Only take over-the-counter or prescription medicines for pain, discomfort, or fever as directed by your caregiver.  A cold steam vaporizer or humidifier in your room or home may help loosen secretions.  Cough is most often worse at night. Sleeping in a semi-upright position in a recliner or using a couple pillows will help.  Obtain rest as needed.  Make arrangements for a follow-up visit.  Avoid cigarette smoke and lung irritants. Smoking is a common cause of bronchitis and can contribute to pneumonia. Stopping this habit is an important self help.  Stay inside when pollution and ozone levels are high.  Stay current with vaccinations and immunizations.  Avoid sedatives and antihistamines which tend to thicken the mucus in the lungs.  Always try to stay well hydrated by drinking plenty of water.  Antibiotics are often given for infection. Take them as directed.  Medications called bronchodilators are often used to make the air passages larger.  Physical therapy methods should be learned so you can know how to clear mucus more easily from your lungs. PROGNOSIS   Bronchiectasis varies in severity from patient to patient.  When lung infections are treated immediately, bronchiectasis is less likely to develop.  When it does develop, following the  above instructions will help with the outcome.  Lung transplants or removing a portion of a lung are sometimes done for severe cases. Death is uncommon but can be caused by massive bleeding into the lungs. SEEK IMMEDIATE MEDICAL CARE IF:   You develop more pus like  (purulent) sputum, have uncontrolled temperature, or become progressively more ill (debilitated). This is especially true if you are elderly or sick from another disease.  You cannot control your cough with suppressants and are losing sleep.  You begin coughing up blood.  You develop chest pain or increasing shortness of breath.  You develop pain which is getting worse or is uncontrolled with medications.  You develop an oral temperature above 102 F (38.9 C) or as instructed, after not having a temperature for one or more days (afebrile).  If any of the symptoms which brought you initially into the emergency room are getting worse rather than better. MAKE SURE YOU:   Understand these instructions.  Will watch your condition.  Will get help right away if you are not doing well or get worse. Document Released: 06/04/2007 Document Revised: 10/30/2011 Document Reviewed: 06/04/2007 [facility] Patient Information 2013 [place], [place].

## 2012-11-13 NOTE — Assessment & Plan Note (Signed)
BP Readings from Last 3 Encounters:  11/13/12 122/70  10/29/12 140/72  09/11/12 124/64   Previously on hydrochlorothiazide therapy, not currently on any medications for greater than 2 months Currently blood pressure well controlled, likely related to unintentional weight loss Continue to monitor, consider resuming therapy as needed

## 2012-11-26 ENCOUNTER — Ambulatory Visit (INDEPENDENT_AMBULATORY_CARE_PROVIDER_SITE_OTHER): Payer: Medicare Other

## 2012-11-26 DIAGNOSIS — D649 Anemia, unspecified: Secondary | ICD-10-CM

## 2012-11-27 MED ORDER — CYANOCOBALAMIN 1000 MCG/ML IJ SOLN
1000.0000 ug | Freq: Once | INTRAMUSCULAR | Status: AC
  Administered 2012-11-27: 1000 ug via INTRAMUSCULAR

## 2012-11-29 ENCOUNTER — Encounter: Payer: Self-pay | Admitting: Internal Medicine

## 2012-11-29 ENCOUNTER — Ambulatory Visit (INDEPENDENT_AMBULATORY_CARE_PROVIDER_SITE_OTHER): Payer: Medicare Other | Admitting: Internal Medicine

## 2012-11-29 VITALS — BP 120/72 | HR 125 | Ht 67.0 in | Wt 122.2 lb

## 2012-11-29 DIAGNOSIS — J9610 Chronic respiratory failure, unspecified whether with hypoxia or hypercapnia: Secondary | ICD-10-CM

## 2012-11-29 DIAGNOSIS — J479 Bronchiectasis, uncomplicated: Secondary | ICD-10-CM

## 2012-11-29 NOTE — Patient Instructions (Addendum)
Order- [facility] script for portable O2 concentrator  2L/M continuous flow for    Dx  bronchiectasis, chronic respiratory failure  (may need to change DME)   Please call as needed

## 2012-11-29 NOTE — Progress Notes (Signed)
Patient ID: Stacy Novak, female    DOB: 04/23/1941, 72 y.o.   MRN: [id number]  HPI 11/30/10-72 yo F never smoker, followed for bronchiectasis/ COPD, with hx treated atypical AFB/ MAIC, occasional hemoptysis and GERD.  Last here July 29, 2010 after normal flora on sputum cultures in October. Last antibiotic was augmentin in December. She questions if she should have had longer than 1 week treatment. Daily productive cough- yellow to light green. Shortness of breath with exertion across parking lot, up steps. . She does use Flutter occasionally. . No recent blood, fever, sweats or chest pain. Trial of Daliresp caused malaise.   03/01/11-72 yo F never smoker, followed for bronchiectasis/ COPD, with hx treated atypical AFB/ MAIC, occasional hemoptysis and GERD. No acute issues "just can't breathe and walk". She has avoided the weather extremes recently by staying in.  Cough always productive, greenish. Denies fever, sweat.  CXR- 11/30/10- reviewed with her- no definite new areas, but significant cystic bronchiectasis diffusely with some air fluid levels. PFT- 12/21/10- FEV1 1.02/ 49%; FEV1/FVC 0.54; no response to BD; TLC 0.77; DLCO 0.58    Severe obstructive disease, mild restriction.  6MWT She asks to defer formal oxygen assessment till [place] [facility] [place]. She has less cough in last few days, doesn't feel she could leave sputum specimen, but would like script for brand-name levaquin, which works better.   05/05/11- 72 yo F never smoker, followed for bronchiectasis/ COPD, with hx treated atypical AFB/ MAIC, occasional hemoptysis and GERD. We gave (she requested brand-name) levaquin to hold last visit. She felt she probably needed it because of pleuritic right chest pain that lasted 2-3 days, self limited. She isn't sure there was any change in her daily productive cough- usually white. Maybe some fever once. She waited because she had upset GI with a  little nausea and diarrhea. Still holding the levaquin.   07/06/11-  72 yo F never smoker, followed for bronchiectasis/ COPD, with hx treated atypical AFB/ MAIC, occasional hemoptysis and GERD. Has had flu vaccine. Has not taken the Levaquin prescription we gave previously. She was taking maintenance Zithromax to suppress bronchitis. She stayed off it for 2 weeks because of GI upset with cramps, after successfully taking it for 6 weeks. She has no GI discomfort now. She is going to try resuming maintenance Zithromax, but adding a probiotic. She did not tolerate Daliresp.  11/07/11-  72 yo F never smoker, followed for bronchiectasis/ COPD, with hx treated atypical AFB/ MAIC, occasional hemoptysis and GERD. Still coughing a lot, often productive, with looser mucus. Had tried maintenance Zithromax until she got GI upset. Remains on chronic Nexium for GERD. Discussed her recent reports of cardiac conduction problems with macrolide antibiotics. (REC- next visit- look at HR, chemistry)  01/19/12- 72 yo F never smoker, followed for bronchiectasis/ COPD, with hx treated atypical AFB/ MAIC, occasional hemoptysis and GERD. Coughing up blood-bright red; would happen off and on, but only on May 30 with no repeat today , . SOB and wheezing has increased. Always has some cough which has not changed. No easy bruising or other bleeding. No fever. Previous hemoptysis 2 or 3 years ago at which time we stopped the baby aspirin.  03/19/12-  72 yo F never smoker, followed for bronchiectasis/ COPD, with hx treated atypical AFB/ MAIC, occasional hemoptysis and GERD. Having increased SOB past 3 weeks; losing weight and unsure why; no energy.. Has lost 10 lbs since last visit.  140>130lbs. COPD assessment test (CAT) score 32/40. She complains of weakness, aching joints, weight loss. 2 episodes where she passed mucus per rectum-not blood. Dry eyes, nose and mouth times one month. Bloated with eating-takes her breath. Burning  she-asks B12 shot. Jittery. Has slept up in a chair for years because of chronic midthoracic back pain has not gone back to see Dr. [name] in a long time. She asks if all of this is a side effect of Nexium. We reviewed her radiology again. CXR 01/23/12-  IMPRESSION:  Stable chronic changes with scattered air-fluid levels, as  described above.  Original Report Authenticated By: [name], M.D.   04/02/12- 72 yo F never smoker, followed for  Cystic bronchiectasis/ COPD, with hx treated atypical AFB/ MAIC, occasional hemoptysis and GERD. [facility]03[facility]27[facility]12 for dx bronchiectasis w/ acute exacerbation, COPD, anemia/ B12 deficiency, Failure to thrive.Treated vanc/ zithromax/ ceftazidime. Cultures all no growth. Severe diffuse cystic bronchiectasis. Had ID consult. ECHO 8/2- normal EF60%, unable to visualize for PA pressures. Treated B12 deficiency x 5 days, then outpat injection.  She has not re-established with [name]/ PCP and says there is not a current record there. Air conditioning broken, so she has been staying at their [name] place. Eating better, not gaining weight. Weak. Thick white mucus in mouth. No fever, chills, blood, pain, nodes.   04/17/12- [name]/ INF.DIS. DISEASE, PULMONARY D/T MYCOBACTERIA - [name], MD 04/17/2012 12:24 PM Signed  She is not doing well based on her weight loss and probable recent exacerbation. I reviewed her CXR with her, there is not a huge difference vs her previous studies from 11-2010. She is offered to restart her MAI therapy but she wants "to get her stomach straightened out". She has quit taking her prilosec/nexium and is relying on tums now.will get a repeat sputum AFB on her (she is given a specimen container and will return after she is able to provide a good specimen).  Needs improved nutrition and probably home O2 (i encouraged her multiple times to call [name] about this). We may need GI help with her stomach issues.  Will see her  back on October 3.   04/30/12-  72 yo F never smoker, followed for severe Cystic bronchiectasis/ COPD, with hx treated atypical AFB/ MAIC, occasional hemoptysis and GERD. Unable to tell any difference with using O2 QHS-does not use O2 during the day. Increased SOB-not getting any better; Unable to drink much water. Minor epistaxis blamed on dryness from her oxygen. Son thinks/blood-tinged sputum occasionally, otherwise yellowish or greenish. She had stopped Nexium, blaming it for her poor appetite-it seemed to cause bloating. Instead she now uses TUMS and we discussed his meds. Complains of burning in her feet attributed by her to B12 deficiency. I again asked her to get back with her primary physician.  05/14/12- 42 yo F never smoker, followed for severe Cystic bronchiectasis/ COPD, with hx treated atypical AFB/ MAIC, occasional hemoptysis and GERD  Husband here Sputum cx 03/22/12- Nl flora. ACUTE VISIT: increased cough-yellow in color and Increased SOB-getting worse. Cough is chronically productive. In the last few days has felt more short of breath, nonspecific. Had flu vaccine. Using saline nasal spray for nasal dryness on home oxygen at 2 L. She is taking a liquid antacid instead of Nexium and admits reduced by mouth intake. Claims foods makes her bloated CXR 03/22/12- I reviewed the images with patient and husband IMPRESSION:  No significant change in the severe chronic changes as above.  Original Report Authenticated By: Cyndie Chime, M.D.   07/30/12- 10 yo F never smoker, followed for severe Cystic bronchiectasis/ COPD, with hx treated atypical AFB/ MAIC, occasional hemoptysis and GERD   FOLLOWS FOR: not able to eat or drink enough; sores in nose; not wearing O2 every night due to bumps in nose, feels weak and like she needs fluids. Has lost 20 lbs over past year. Always feels hot at first to keep her home about 23 which is too cold for family. Still spending most of her time down at [facility]. Always productive cough without change. Exertion makes her cough more productive. She will see Dr. Hatcher/Infectious Disease again in January. Continues oxygen 2 L per minute at night and as needed in the day. Never took last prescription for Augmentin in September because she lost the prescription. Wants to continue B12 shots. No numbness in her feet.  09/11/12- 68 yo F never smoker, followed for severe Cystic bronchiectasis/ COPD, with hx treated atypical AFB/ MAIC, occasional hemoptysis and GERD   ACUTE: 86% on RA on arrival-- Dr. Ninetta Lights ID saw her today and thought she should be seen, heard "rubbing" in her lungs today--diff breathing worsening over last "couple" weeks w even small activities-- dry cough, denies any wheezing or chest tightness.  Exercise tolerance always poor. No distinct event but may be more DOE in last 2 weeks. No change in chronic cough, sometimes productive of yellow green, occasional small streak of blood. No fever, nodes, palpitation or chest pain. Feet swell some, dependent. Very weak. Dyspnea and weakness caused by her lung disease interfere with routine activities- dressing, grooming, ambulating through home. She needs to be able to sit frequently, and needs assistance for sustained ambulation,. She should have a light wheel chair. I don't think she is strong enough to manage a regular weight chair by herself. .  Weight stable 121-123 since October. She didn't take augmentin when given because she says she wasn't taking enough fluids for it- discussed. Has O2 concentrator for sleep but tends to leave it off- prongs bother nose.   10/29/12- 79 yo F never smoker, followed for severe Cystic bronchiectasis/ COPD, with hx treated atypical AFB/ MAIC, occasional hemoptysis and GERD FOLLOWS FOR: Pt states she has not recently been wearing her O2 at night as she feels it makes her breathing worse during the day; States she has noticed increased SOB as well. 85% RA on  arrival-took approximately 2 minutes to reach 90% RA. She finally took the antibiotic we had given her. She stays short of breath but not wearing oxygen. Her portable tank is too heavy for her. We discussed alternatives including small concentrators. She has generally sought to minimize intervention and treatment. 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  Numerous air-fluid levels appear progressive and could be due to  infection.  Original Report Authenticated By: Rudie Meyer, M.D.  11/29/12- 5 yo F never smoker, followed for severe Cystic bronchiectasis/ COPD, with hx treated atypical AFB/ MAIC, occasional hemoptysis and GERD    Husband is here. FOLLOWS FOR:Pt feels she gets worse when on O2( 2L/ [facility]); unsure of how her breathing is really doing We discussed her oxygen. She says she is "afraid" of oxygen tanks and uncomfortable working with them. We discussed portable concentrators. Her husband is aware that she frequently is not compliant with therapies.  Review of Systems-See HPI Constitutional:   stabilized weight loss, No-night sweats, No- Fevers, chills, +fatigue, lassitude. HEENT:  No headaches,  Difficulty swallowing,  Tooth/dental problems,  Sore throat,                No sneezing, itching, ear ache, nasal congestion, post nasal drip,  CV:  No chest pain, orthopnea, PND, swelling in lower extremities, anasarca, +dizziness, No-palpitations GI  No heartburn, indigestion, abdominal pain, nausea, vomiting, Resp: +Shortness of breath with exertion. , +slight coughing up of blood. .  Some minimal wheezing.  Skin: no rash or lesions. GU:   MS:  + joint pain or swelling.   Psych:  No change in mood or affect. No depression + anxiety.  No memory loss.  Objective:   Physical Exam  General- Alert, Oriented, Affect-appropriate, Distress- none acute; thin. Looks chronically ill, wheelchair, O2 2L  Skin- rash-none, lesions- none, excoriation-  none. +Dusky hands and feet Lymphadenopathy- none Head- atraumatic            Eyes- Gross vision intact, PERRLA, conjunctivae clear secretions            Ears- Hearing, canals- normal            Nose- Clear, No-Septal dev, mucus, polyps, erosion, perforation             Throat- Mallampati II , mucosa clear , drainage- none, tonsils- atrophic Neck- flexible , trachea midline, no stridor , thyroid nl, carotid no bruit Chest - symmetrical excursion , unlabored           Heart/CV- + remains rapid RRR ., no murmur , no gallop  , no rub, nl s1 s2                            JVD+ full , edema +1, stasis changes- none, varices- none           Lung-  + bilateral faint rhonchi, unlabored, cough- mild , dullness-none, rub- none           Chest wall-  Abd-  Br/ Gen/ Rectal- Not done, not indicated Extrem- cyanosis- none, clubbing, none, atrophy- none, strength- nl Neuro- grossly intact to observation

## 2012-12-07 ENCOUNTER — Encounter: Payer: Self-pay | Admitting: Internal Medicine

## 2012-12-07 NOTE — Assessment & Plan Note (Signed)
Clinically she looks about the same and on chest exam she actually sounds better than last visit. We had a long talk about portable oxygen therapies. She says [facility] may not be able to provide a portable concentrator that meets her needs with continuous flow. Her patient care coordinator can help her check some other companies.

## 2012-12-09 ENCOUNTER — Telehealth: Payer: Self-pay | Admitting: Internal Medicine

## 2012-12-09 NOTE — Telephone Encounter (Signed)
Per CY - ok to come by for extra B12 injection this time. See what it does.  Pt is aware of CY recommendation.

## 2012-12-09 NOTE — Telephone Encounter (Signed)
Spoke with patient, Received vit b12 2 weeks ago (4.8.14) States usually after she has shot skin starts to feel better and bottom of feet stop burning However this time sx remain She is Due to receive another shot in 2 more weeks (5.6.14) Patient concerned if she is receiving enough b12 to alleviate these symptoms? Patient would like to know if she could start possibly getting 2 shots per month or what Dr. Maple Hudson thinks Please advise, thank you  Last OV:11/29/12   Next OV:02/28/13

## 2012-12-10 ENCOUNTER — Telehealth: Payer: Self-pay | Admitting: Internal Medicine

## 2012-12-10 ENCOUNTER — Ambulatory Visit: Payer: Medicare Other | Admitting: Internal Medicine

## 2012-12-10 NOTE — Telephone Encounter (Signed)
Called and spoke with pt and she stated that for the last 2 days she has been having increase in SOB.  Cough dry at times but other times she can get up brown/yellow sputum.  Chest discomfort at times but she feels that this is from coughing.  Denies any f/c/s.  Pt requested to be seen tomorrow but no openings with TP or CY.  CY please advise. Thanks  Allergies  Allergen Reactions  . Daliresp (Roflumilast)     "messed with my head"  . Sulfonamide Derivatives     REACTION: nausea

## 2012-12-10 NOTE — Telephone Encounter (Signed)
Per CY-lets work patient in for neb and depo injection. Pt is aware to be here at 10:00am on Wednesday 12-11-12; she will also get her B12 injection as this visit as well.

## 2012-12-11 ENCOUNTER — Ambulatory Visit (INDEPENDENT_AMBULATORY_CARE_PROVIDER_SITE_OTHER): Payer: Medicare Other | Admitting: Internal Medicine

## 2012-12-11 ENCOUNTER — Encounter: Payer: Self-pay | Admitting: Internal Medicine

## 2012-12-11 VITALS — BP 120/66 | HR 116 | Ht 67.0 in | Wt 122.0 lb

## 2012-12-11 DIAGNOSIS — J479 Bronchiectasis, uncomplicated: Secondary | ICD-10-CM

## 2012-12-11 DIAGNOSIS — D518 Other vitamin B12 deficiency anemias: Secondary | ICD-10-CM

## 2012-12-11 DIAGNOSIS — D519 Vitamin B12 deficiency anemia, unspecified: Secondary | ICD-10-CM

## 2012-12-11 MED ORDER — METHYLPREDNISOLONE ACETATE 80 MG/ML IJ SUSP
80.0000 mg | Freq: Once | INTRAMUSCULAR | Status: AC
  Administered 2012-12-11: 80 mg via INTRAMUSCULAR

## 2012-12-11 MED ORDER — LEVALBUTEROL HCL 0.63 MG/3ML IN NEBU
0.6300 mg | INHALATION_SOLUTION | Freq: Once | RESPIRATORY_TRACT | Status: AC
  Administered 2012-12-11: 0.63 mg via RESPIRATORY_TRACT

## 2012-12-11 NOTE — Progress Notes (Signed)
Patient ID: Stacy Novak, female    DOB: 04/23/1941, 72 y.o.   MRN: [id number]  HPI 11/30/10-72 yo F never smoker, followed for bronchiectasis/ COPD, with hx treated atypical AFB/ MAIC, occasional hemoptysis and GERD.  Last here July 29, 2010 after normal flora on sputum cultures in October. Last antibiotic was augmentin in December. She questions if she should have had longer than 1 week treatment. Daily productive cough- yellow to light green. Shortness of breath with exertion across parking lot, up steps. . She does use Flutter occasionally. . No recent blood, fever, sweats or chest pain. Trial of Daliresp caused malaise.   03/01/11-72 yo F never smoker, followed for bronchiectasis/ COPD, with hx treated atypical AFB/ MAIC, occasional hemoptysis and GERD. No acute issues "just can't breathe and walk". She has avoided the weather extremes recently by staying in.  Cough always productive, greenish. Denies fever, sweat.  CXR- 11/30/10- reviewed with her- no definite new areas, but significant cystic bronchiectasis diffusely with some air fluid levels. PFT- 12/21/10- FEV1 1.02/ 49%; FEV1/FVC 0.54; no response to BD; TLC 0.77; DLCO 0.58    Severe obstructive disease, mild restriction.  6MWT She asks to defer formal oxygen assessment till [facility] [place] [facility]. She has less cough in last few days, doesn't feel she could leave sputum specimen, but would like script for brand-name levaquin, which works better.   05/05/11- 72 yo F never smoker, followed for bronchiectasis/ COPD, with hx treated atypical AFB/ MAIC, occasional hemoptysis and GERD. We gave (she requested brand-name) levaquin to hold last visit. She felt she probably needed it because of pleuritic right chest pain that lasted 2-3 days, self limited. She isn't sure there was any change in her daily productive cough- usually white. Maybe some fever once. She waited because she had upset GI with a  little nausea and diarrhea. Still holding the levaquin.   07/06/11-  72 yo F never smoker, followed for bronchiectasis/ COPD, with hx treated atypical AFB/ MAIC, occasional hemoptysis and GERD. Has had flu vaccine. Has not taken the Levaquin prescription we gave previously. She was taking maintenance Zithromax to suppress bronchitis. She stayed off it for 2 weeks because of GI upset with cramps, after successfully taking it for 6 weeks. She has no GI discomfort now. She is going to try resuming maintenance Zithromax, but adding a probiotic. She did not tolerate Daliresp.  11/07/11-  72 yo F never smoker, followed for bronchiectasis/ COPD, with hx treated atypical AFB/ MAIC, occasional hemoptysis and GERD. Still coughing a lot, often productive, with looser mucus. Had tried maintenance Zithromax until she got GI upset. Remains on chronic Nexium for GERD. Discussed her recent reports of cardiac conduction problems with macrolide antibiotics. (REC- next visit- look at HR, chemistry)  01/19/12- 72 yo F never smoker, followed for bronchiectasis/ COPD, with hx treated atypical AFB/ MAIC, occasional hemoptysis and GERD. Coughing up blood-bright red; would happen off and on, but only on May 30 with no repeat today , . SOB and wheezing has increased. Always has some cough which has not changed. No easy bruising or other bleeding. No fever. Previous hemoptysis 2 or 3 years ago at which time we stopped the baby aspirin.  03/19/12-  72 yo F never smoker, followed for bronchiectasis/ COPD, with hx treated atypical AFB/ MAIC, occasional hemoptysis and GERD. Having increased SOB past 3 weeks; losing weight and unsure why; no energy.. Has lost 10 lbs since last visit.  140>130lbs. COPD assessment test (CAT) score 32/40. She complains of weakness, aching joints, weight loss. 2 episodes where she passed mucus per rectum-not blood. Dry eyes, nose and mouth times one month. Bloated with eating-takes her breath. Burning  she-asks B12 shot. Jittery. Has slept up in a chair for years because of chronic midthoracic back pain has not gone back to see Dr. [name] in a long time. She asks if all of this is a side effect of Nexium. We reviewed her radiology again. CXR 01/23/12-  IMPRESSION:  Stable chronic changes with scattered air-fluid levels, as  described above.  Original Report Authenticated By: [name], M.D.   04/02/12- 72 yo F never smoker, followed for  Cystic bronchiectasis/ COPD, with hx treated atypical AFB/ MAIC, occasional hemoptysis and GERD. Post [facility]-8[facility]-03/27/12 for dx bronchiectasis w/ acute exacerbation, COPD, anemia/ B12 deficiency, Failure to thrive.Treated vanc/ zithromax/ ceftazidime. Cultures all no growth. Severe diffuse cystic bronchiectasis. Had ID consult. ECHO 8/2- normal EF60%, unable to visualize for PA pressures. Treated B12 deficiency x 5 days, then outpat injection.  She has not re-established with [name]/ PCP and says there is not a current record there. Air conditioning broken, so she has been staying at their [name] place. Eating better, not gaining weight. Weak. Thick white mucus in mouth. No fever, chills, blood, pain, nodes.   04/17/12- [name]/ INF.DIS. DISEASE, PULMONARY D/T MYCOBACTERIA - [name], MD 04/17/2012 12:24 PM Signed  She is not doing well based on her weight loss and probable recent exacerbation. I reviewed her CXR with her, there is not a huge difference vs her previous studies from 11-2010. She is offered to restart her MAI therapy but she wants "to get her stomach straightened out". She has quit taking her prilosec/nexium and is relying on tums now.will get a repeat sputum AFB on her (she is given a specimen container and will return after she is able to provide a good specimen).  Needs improved nutrition and probably home O2 (i encouraged her multiple times to call [name] about this). We may need GI help with her stomach issues.  Will see her  back on October 3.   04/30/12-  72 yo F never smoker, followed for severe Cystic bronchiectasis/ COPD, with hx treated atypical AFB/ MAIC, occasional hemoptysis and GERD. Unable to tell any difference with using O2 QHS-does not use O2 during the day. Increased SOB-not getting any better; Unable to drink much water. Minor epistaxis blamed on dryness from her oxygen. Son thinks/blood-tinged sputum occasionally, otherwise yellowish or greenish. She had stopped Nexium, blaming it for her poor appetite-it seemed to cause bloating. Instead she now uses TUMS and we discussed his meds. Complains of burning in her feet attributed by her to B12 deficiency. I again asked her to get back with her primary physician.  05/14/12- 42 yo F never smoker, followed for severe Cystic bronchiectasis/ COPD, with hx treated atypical AFB/ MAIC, occasional hemoptysis and GERD  Husband here Sputum cx 03/22/12- Nl flora. ACUTE VISIT: increased cough-yellow in color and Increased SOB-getting worse. Cough is chronically productive. In the last few days has felt more short of breath, nonspecific. Had flu vaccine. Using saline nasal spray for nasal dryness on home oxygen at 2 L. She is taking a liquid antacid instead of Nexium and admits reduced by mouth intake. Claims foods makes her bloated CXR 03/22/12- I reviewed the images with patient and husband IMPRESSION:  No significant change in the severe chronic changes as above.  Original Report Authenticated By: Cyndie ChimeKEVIN G. DOVER, M.D.   07/30/12- 72 yo F never smoker, followed for severe Cystic bronchiectasis/ COPD, with hx treated atypical AFB/ MAIC, occasional hemoptysis and GERD   FOLLOWS FOR: not able to eat or drink enough; sores in nose; not wearing O2 every night due to bumps in nose, feels weak and like she needs fluids. Has lost 20 lbs over past year. Always feels hot at first to keep her home about 564 which is too cold for family. [place] [facility] [place] [facility]  [place]. Always productive cough without change. Exertion makes her cough more productive. She will see Dr. Hatcher/Infectious Disease again in January. Continues oxygen 2 L per minute at night and as needed in the day. Never took last prescription for Augmentin in September because she lost the prescription. Wants to continue B12 shots. No numbness in her feet.  09/11/12- 89 yo F never smoker, followed for severe Cystic bronchiectasis/ COPD, with hx treated atypical AFB/ MAIC, occasional hemoptysis and GERD   ACUTE: 86% on RA on arrival-- Dr. Ninetta LightsHatcher/ ID saw her today and thought she should be seen, heard "rubbing" in her lungs today--diff breathing worsening over last "couple" weeks w even small activities-- dry cough, denies any wheezing or chest tightness.  Exercise tolerance always poor. No distinct event but may be more DOE in last 2 weeks. No change in chronic cough, sometimes productive of yellow green, occasional small streak of blood. No fever, nodes, palpitation or chest pain. Feet swell some, dependent. Very weak. Dyspnea and weakness caused by her lung disease interfere with routine activities- dressing, grooming, ambulating through home. She needs to be able to sit frequently, and needs assistance for sustained ambulation,. She should have a light wheel chair. I don't think she is strong enough to manage a regular weight chair by herself. .  Weight stable 121-123 since October. She didn't take augmentin when given because she says she wasn't taking enough fluids for it- discussed. Has O2 concentrator for sleep but tends to leave it off- prongs bother nose.   10/29/12- 72 yo F never smoker, followed for severe Cystic bronchiectasis/ COPD, with hx treated atypical AFB/ MAIC, occasional hemoptysis and GERD FOLLOWS FOR: Pt states she has not recently been wearing her O2 at night as she feels it makes her breathing worse during the day; States she has noticed increased SOB as well. 85% RA on  arrival-took approximately 2 minutes to reach 90% RA. She finally took the antibiotic we had given her. She stays short of breath but not wearing oxygen. Her portable tank is too heavy for her. We discussed alternatives including small concentrators. She has generally sought to minimize intervention and treatment. 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  Numerous air-fluid levels appear progressive and could be due to  infection.  Original Report Authenticated By: Rudie MeyerP. Gallerani, M.D.  11/29/12- 72 yo F never smoker, followed for severe Cystic bronchiectasis/ COPD, with hx treated atypical AFB/ MAIC, occasional hemoptysis and GERD    Husband is here. FOLLOWS FOR:Pt feels she gets worse when on O2( 2L/ Advanced); unsure of how her breathing is really doing We discussed her oxygen. She says she is "afraid" of oxygen tanks and uncomfortable working with them. We discussed portable concentrators. Her husband is aware that she frequently is not compliant with therapies.  12/11/12- 72 yo F never smoker, followed for severe Cystic bronchiectasis/ COPD, with hx treated atypical AFB/ MAIC,  occasional hemoptysis and GERD    Husband is here. Acute work-in today for increased cough and shortness of breath. She wanted a nebulizer treatment and Depo-Medrol. We discussed her B12 shots and I asked her to followup with her primary physician for long-term decision about this. She has a peripheral neuropathy but the biggest problems have been cachexia and some difficulty with medication compliance. She denies fever, chills, swollen glands. She has a followup appointment with infectious disease.  Review of Systems-See HPI Constitutional:   + weight loss, No-night sweats, No- Fevers, chills, +fatigue, lassitude. HEENT:   No headaches,  Difficulty swallowing,  Tooth/dental problems,  Sore throat,                No sneezing, itching, ear ache, nasal congestion, post nasal drip,   CV:  No chest pain, orthopnea, PND, swelling in lower extremities, anasarca, +dizziness, No-palpitations GI  No heartburn, indigestion, abdominal pain, nausea, vomiting, Resp: +Shortness of breath with exertion. , no recent coughing up of blood. .  Some minimal wheezing.  Skin: no rash or lesions. GU:   MS:  + joint pain or swelling.   Psych:  No change in mood or affect. No depression + anxiety.  No memory loss.  Objective:   Physical Exam  General- Alert, Oriented, Affect-appropriate, Distress- none acute; thin. Looks chronically ill, wheelchair, O2 2L  Skin- rash-none, lesions- none, excoriation- none. +Dusky hands and feet Lymphadenopathy- none Head- atraumatic            Eyes- Gross vision intact, PERRLA, conjunctivae clear secretions            Ears- Hearing, canals- normal            Nose- Clear, No-Septal dev, mucus, polyps, erosion, perforation             Throat- Mallampati II , mucosa clear , drainage- none, tonsils- atrophic Neck- flexible , trachea midline, no stridor , thyroid nl, carotid no bruit Chest - symmetrical excursion , unlabored           Heart/CV- + remains rapid RRR ., no murmur , no gallop  , no rub, nl s1 s2                            JVD+ full , edema +1, stasis changes- none, varices- none           Lung-  + bilateral  rhonchi, unlabored, cough- mild , dullness-none, rub- none           Chest wall-  Abd-  Br/ Gen/ Rectal- Not done, not indicated Extrem- cyanosis- none, clubbing, none, atrophy- none, strength- nl Neuro- grossly intact to observation

## 2012-12-11 NOTE — Patient Instructions (Addendum)
We gave B12 today,but this is part of your broad medicine care that should be managed under the supervision of your primary physician, along with attention to your peripheral neuropathy and nutrition status.   Neb xop 0.63  Depo 80

## 2012-12-12 ENCOUNTER — Ambulatory Visit: Payer: Medicare Other | Admitting: Infectious Diseases

## 2012-12-12 MED ORDER — CYANOCOBALAMIN 1000 MCG/ML IJ SOLN
1000.0000 ug | Freq: Once | INTRAMUSCULAR | Status: AC
  Administered 2012-12-11: 1000 ug via INTRAMUSCULAR

## 2012-12-17 ENCOUNTER — Other Ambulatory Visit: Payer: Self-pay | Admitting: *Deleted

## 2012-12-17 ENCOUNTER — Ambulatory Visit
Admission: RE | Admit: 2012-12-17 | Discharge: 2012-12-17 | Disposition: A | Discharge status: Home / Self Care | Payer: Medicare Other | Source: Ambulatory Visit | Attending: Infectious Diseases | Admitting: Infectious Diseases

## 2012-12-17 ENCOUNTER — Ambulatory Visit (INDEPENDENT_AMBULATORY_CARE_PROVIDER_SITE_OTHER): Payer: Medicare Other | Admitting: Infectious Diseases

## 2012-12-17 ENCOUNTER — Encounter: Payer: Self-pay | Admitting: Infectious Diseases

## 2012-12-17 VITALS — BP 110/70 | HR 116 | Temp 97.9°F | Ht 67.0 in | Wt 119.5 lb

## 2012-12-17 DIAGNOSIS — A310 Pulmonary mycobacterial infection: Secondary | ICD-10-CM

## 2012-12-17 MED ORDER — ETHAMBUTOL HCL 400 MG PO TABS: 800.0000 mg | ORAL_TABLET | Freq: Every day | ORAL | Status: DC

## 2012-12-17 MED ORDER — CLARITHROMYCIN 250 MG PO TABS: 500.0000 mg | ORAL_TABLET | Freq: Every day | ORAL | Status: DC

## 2012-12-17 NOTE — Assessment & Plan Note (Signed)
Will restart her on MAI rx. Will start with 2 drugs due to her hx of intolerance. Will recheck her CXR. Will see her back in 6 weeks. She will call if she has any problems in the intervening period.

## 2012-12-17 NOTE — Progress Notes (Signed)
  Subjective:    Patient ID: Stacy Novak, female    DOB: 02/25/1941, 72 y.o.   MRN: [id number]  HPI 72 y.o. female PMH of Cystic Bronchiectasis, MAC, COPD, GERD. Had previously recieved a 1 yr course of E/B 2002. She relapsed in July of 2007 and was started on E/A/R (Cx showed MAI, S-Clofaz, Copywriter, advertising. I- Rifampin. Synergy S- E/R).  Had chest X-ray on 03/19/2012 with positive findings of extensive pulmonary parenchymal architectural distortion associated air fluid levels similar to multiple prior exams, referred 03-21-12 to [facility] hospital for further evaluation. Treated with Levaquin for possible Pneumonia on June/2013. She was offered Bronch in hospital but declined. Her sputum Cx was AFB (-). She was treated with azithro/ceftaz for 7 days and d/c home on 03-27-12 with no further anbx.  At her previous ID visit (05-29-12), she had AFB sputum cx that was (-).  Has been more SOB and required more O2 since her last visit. She Had CXR (09-11-12):Severe chronic lung disease with cystic bronchiectasis. Numerous air-fluid levels appear progressive and could be due to infection.   Still having productive cough: yellow/brown. No hemoptysis. No f/c. Doesn't use albuterol (makes her jittery and wasn't sure that it helped her breathing). Wt down 2# from previous.  Having severe neuropathy of her feet. Has noted erythema of her feet as well.     Review of Systems See hpi    Objective:   Physical Exam  Constitutional: She appears cachectic.  HENT:  Mouth/Throat: No oropharyngeal exudate.  Eyes: EOM are normal. Pupils are equal, round, and reactive to light.  Neck: Neck supple.  Cardiovascular: Normal rate, regular rhythm and normal heart sounds.   Pulmonary/Chest: Accessory muscle usage present. Tachypnea noted. She has rhonchi.    Abdominal: Soft. Bowel sounds are normal. There is no tenderness.  Musculoskeletal: She exhibits no edema.  Feet are cool, I was unable to feel her pulses  Lymphadenopathy:   She has no cervical adenopathy.  Neurological: She is alert.          Assessment & Plan:

## 2012-12-24 ENCOUNTER — Ambulatory Visit (INDEPENDENT_AMBULATORY_CARE_PROVIDER_SITE_OTHER): Payer: Medicare Other | Admitting: *Deleted

## 2012-12-24 ENCOUNTER — Telehealth: Payer: Self-pay | Admitting: *Deleted

## 2012-12-24 ENCOUNTER — Ambulatory Visit: Payer: Medicare Other

## 2012-12-24 DIAGNOSIS — D519 Vitamin B12 deficiency anemia, unspecified: Secondary | ICD-10-CM

## 2012-12-24 DIAGNOSIS — D518 Other vitamin B12 deficiency anemias: Secondary | ICD-10-CM

## 2012-12-24 MED ORDER — CYANOCOBALAMIN 1000 MCG/ML IJ SOLN
1000.0000 ug | Freq: Once | INTRAMUSCULAR | Status: AC
  Administered 2012-12-24: 1000 ug via INTRAMUSCULAR

## 2012-12-24 NOTE — Telephone Encounter (Signed)
Pt wants to know if she can start coming in for B12 injection 2x/month. She states that her sxs of burning of soles of feet usually return after injection's effect wears off and wants to come 2x/month instead of monthly. Pt also wants to know if she needs her level checked since she hasn't had it check in a while.

## 2012-12-25 NOTE — Telephone Encounter (Signed)
No, no reason to recheck as levels will be high following administration of B12 shot Also, no medical indication for injections more frequent than every 30 days She is welcome to supplement her injections with daily vitamin B12 over-the-counter 1000 mcg daily, sublingual or oral

## 2012-12-25 NOTE — Telephone Encounter (Signed)
Pt informed of MD's advisement. Pt wants to know if order can be placed before next B12 injection to check her levels.

## 2012-12-25 NOTE — Telephone Encounter (Signed)
Lab order entered, but I am unable to sign unless lab has pt sign waiver

## 2012-12-31 ENCOUNTER — Telehealth: Payer: Self-pay

## 2012-12-31 NOTE — Telephone Encounter (Signed)
Spoke to Dr. Ninetta Lights and he was fine with patient holding off on medications.  Called patient and informed of this and to please call us if she has any more questions or concerns.

## 2012-12-31 NOTE — Telephone Encounter (Signed)
Dr. Ninetta Lights, Patient called and states she has not started the two meds for MAI (I think it was Biaxin and Myambutol).  She is not prepared to start right now.  She is concerned because she has lost weight recently and one of the meds causes loss of appetite. She wanted you to be aware in case you need to speak with her. Thank you, Asher Muir

## 2013-01-21 ENCOUNTER — Ambulatory Visit (INDEPENDENT_AMBULATORY_CARE_PROVIDER_SITE_OTHER): Payer: Medicare Other | Admitting: *Deleted

## 2013-01-21 DIAGNOSIS — D519 Vitamin B12 deficiency anemia, unspecified: Secondary | ICD-10-CM

## 2013-01-21 DIAGNOSIS — D518 Other vitamin B12 deficiency anemias: Secondary | ICD-10-CM

## 2013-01-21 MED ORDER — CYANOCOBALAMIN 1000 MCG/ML IJ SOLN
1000.0000 ug | Freq: Once | INTRAMUSCULAR | Status: AC
  Administered 2013-01-21: 1000 ug via INTRAMUSCULAR

## 2013-01-23 ENCOUNTER — Ambulatory Visit: Payer: Medicare Other

## 2013-01-28 ENCOUNTER — Ambulatory Visit (INDEPENDENT_AMBULATORY_CARE_PROVIDER_SITE_OTHER): Payer: Medicare Other | Admitting: Infectious Diseases

## 2013-01-28 ENCOUNTER — Encounter: Payer: Self-pay | Admitting: Infectious Diseases

## 2013-01-28 VITALS — BP 134/76 | HR 125 | Temp 98.5°F

## 2013-01-28 DIAGNOSIS — A310 Pulmonary mycobacterial infection: Secondary | ICD-10-CM

## 2013-01-28 NOTE — Progress Notes (Signed)
  Subjective:    Patient ID: Stacy Novak, female    DOB: Jan 11, 1941, 72 y.o.   MRN: [id number]  HPI 72 y.o. female PMH of Cystic Bronchiectasis, MAC, COPD, GERD. Had previously recieved a 1 yr course of E/B 2002. She relapsed in July of 2007 and was started on E/A/R (Cx showed MAI, S-Clofaz, Copywriter, advertising. I- Rifampin. Synergy S- E/R).  Had chest X-ray on 03/19/2012 with positive findings of extensive pulmonary parenchymal architectural distortion associated air fluid levels similar to multiple prior exams, referred 03-21-12 to [facility] hospital for further evaluation. Treated with Levaquin for possible Pneumonia on June/2013. She was offered Bronch in hospital but declined. Her sputum Cx was AFB (-). She was treated with azithro/ceftaz for 7 days and d/c home on 03-27-12 with no further anbx.  At her previous ID visit (05-29-12), she had AFB sputum cx that was (-) 07-12-12.  Had repeat CXR (12-17-12): Severe chronic cystic bronchiectasis with superimposed infection, slightly progressed in the left midzone.  Breathing is about the same- feels like she uses O2 more- for exertion (toileting, brushing teeth- SpO2 goes to 84%). Cough prod of creamy/white and occas to beige. No hemoptysis. Volume of sputum has been about the same. Worse as the day progress, better with cool air.  No fever or chills. Had episode of pain in L chest after coughing 1 week ago.  Wt down 20# since may 2013. Not taking supplements.      Review of Systems     Objective:   Physical Exam  Constitutional: She appears cachectic.  HENT:  Mouth/Throat: No oropharyngeal exudate.  Eyes: EOM are normal. Pupils are equal, round, and reactive to light.  Neck: Neck supple.  Cardiovascular: Normal rate, regular rhythm and normal heart sounds.   Pulmonary/Chest: Accessory muscle usage present. No respiratory distress. She has rhonchi.  Abdominal: Soft. Bowel sounds are normal. There is no tenderness. There is no rebound.  Lymphadenopathy:    She  has no cervical adenopathy.          Assessment & Plan:

## 2013-01-28 NOTE — Assessment & Plan Note (Signed)
She is roughly at her baseline. She did not start the anti-MAC medicine as per previous as she was previously instructed. She was afraid that it would make her loose her appetite. I encouraged her to increase her calories- drinking whole milk, ice cream, then brushing her teeth to prevent thickening of secretions. She will let me know if her clinical status changes- f/c, worsening cough/sputum production. Will see her back in 2 months for re-assess.

## 2013-02-10 ENCOUNTER — Encounter: Payer: Self-pay | Admitting: Internal Medicine

## 2013-02-10 ENCOUNTER — Ambulatory Visit (INDEPENDENT_AMBULATORY_CARE_PROVIDER_SITE_OTHER): Payer: Medicare Other | Admitting: Internal Medicine

## 2013-02-10 VITALS — BP 128/70 | HR 117 | Temp 98.0°F | Wt 121.4 lb

## 2013-02-10 DIAGNOSIS — R627 Adult failure to thrive: Secondary | ICD-10-CM

## 2013-02-10 DIAGNOSIS — K219 Gastro-esophageal reflux disease without esophagitis: Secondary | ICD-10-CM

## 2013-02-10 DIAGNOSIS — A310 Pulmonary mycobacterial infection: Secondary | ICD-10-CM

## 2013-02-10 NOTE — Progress Notes (Signed)
Subjective:    Patient ID: Stacy Novak, female    DOB: [birth year], 72 y.o.   MRN: [id number]  HPI  Here for follow up - reviewed chronic medical issues today:  Bronchiectasis - history of MAI with frequent and recurrent exacerbations; chronic dyspnea on exertion - overlap with COPD and asthma reviewed - follows regularly with pulmonary for same - the patient reports compliance with medication(s) as prescribed. Denies adverse side effects.  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 Status post treatment by infectious disease - no evidence of recurrence at this time - symptoms compensated by chronic pulmonary issues as noted above  Hypertension - the patient reports Variable compliance with medication prescribed. Denies adverse side effects.  GERD - previously on daily PPI therapy for over 10 years. Self discontinued Nexium may 2013 in the setting of new diagnosis B12 deficiency and pernicious anemia. Currently reports early satiety and bloating as greater discomfort than indigestion or reflux. Uses TUMS as needed. Has declined GI evaluation  Past Medical History  Diagnosis Date  . 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 COPD (chronic obstructive pulmonary disease)   . Bronchiectasis   . Hypercalcemia   . GERD (gastroesophageal reflux disease)   . Hypertension   . Asthma     hx of PNA  . Adult failure to thrive 08/06/2012    20 pound weight loss one year, anorexia, pernicious anemia   . 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 CXR 01/23/12- again shows essentially stable severe fibro--cystic scarring with bronchiectasis and air fluid levels, but no obvious progression. Desat to 86% with exertion walking room air- order portable O2   . GERD   . Pernicious anemia     Review of Systems  Constitutional: Negative for fever; denies new unintended weight change.   Respiratory: Negative for cough or change in chronic shortness of breath.   Cardiovascular: Negative for chest pain or palpitations.      Objective:   Physical Exam  BP 128/70  Pulse 117  Temp(Src) 98 F (36.7 C) (Oral)  Wt 121 lb 6.4 oz (55.067 kg)  BMI 19.01 kg/m2  SpO2 95% Wt Readings from Last 3 Encounters:  02/10/13 121 lb 6.4 oz (55.067 kg)  12/17/12 119 lb 8 oz (54.205 kg)  12/11/12 122 lb (55.339 kg)   Constitutional: She is thin, but appears well-developed and well-nourished. No distress.  Neck: Normal range of motion. Neck supple. No JVD or LAD present. No thyromegaly present.  Cardiovascular: Tachy rate, regular rhythm and normal heart sounds.  No murmur heard. No BLE edema. Pulmonary/Chest: Effort normal and breath sounds diminished bilaterally. No respiratory distress. She has no wheezes.  Neurological: She is alert and oriented to person, place, and time. No cranial nerve deficit. Coordination, speech, balance and recall are normal.  Skin: Skin is warm and dry. No rash noted. No erythema.  Psychiatric: She has a normal mood and affect. Her behavior is normal. Judgment and thought content fair.   Lab Results  Component Value Date   WBC 7.9 09/11/2012   HGB 13.8 09/11/2012   HCT 43.1 09/11/2012   PLT 286.0 09/11/2012   GLUCOSE 105* 09/11/2012   ALT 9 03/19/2012   AST 22 03/19/2012   NA 138 09/11/2012   K 4.2 09/11/2012   CL 101 09/11/2012   CREATININE 0.6 09/11/2012   BUN 14 09/11/2012   CO2  31 09/11/2012   TSH 1.072 03/21/2012   INR 1.1* 01/19/2012   HGBA1C 6.5* 03/21/2012   Lab Results  Component Value Date   VITAMINB12 154* 03/19/2012       Assessment & Plan:   See problem list. Medications and labs reviewed today.

## 2013-02-10 NOTE — Assessment & Plan Note (Signed)
Associated with unintentional weight loss, >20 pounds early 2014, stable in past 3 months  Has declined evaluation for malignancy as she believes her weight loss is related to poor intake/insufficient calories I've encouraged her to keep a food journal and aim for 2000 calories each day check TSH and a1c now - tx if needed

## 2013-02-10 NOTE — Assessment & Plan Note (Signed)
Follows with ID for same - no recurrence

## 2013-02-10 NOTE — Assessment & Plan Note (Signed)
Reports use of Nexium for over 10 years, discontinued in May 2013 because of concerns with malabsorption related to new diagnosis pernicious anemia and B12 deficiency. Currently uses TUMS as needed for indigestion. Reports early satiety and but as contributors to decreased appetite and weight loss. Encouraged GI evaluation for same which she continues declines. As above, if continued weight loss trend, we'll again revisit importance of GI endoscopy. Also encouraged use of H2 blocker if persisting symptoms uncontrolled by [facility]

## 2013-02-10 NOTE — Patient Instructions (Signed)
It was good to see you today. We have reviewed your prior records including labs and tests today Medications reviewed, no changes at this time. Test(s) ordered today. Your results will be released to MyChart (or called to you) after review, usually within 72hours after test completion. If any changes need to be made, you will be notified at that same time. Consider colonoscopy to look for problems causing your weight loss - we will refer next visit if you are still losing weight! Please schedule followup in 6 months, call sooner if problems.

## 2013-02-18 ENCOUNTER — Ambulatory Visit (INDEPENDENT_AMBULATORY_CARE_PROVIDER_SITE_OTHER): Payer: Medicare Other | Admitting: *Deleted

## 2013-02-18 ENCOUNTER — Ambulatory Visit: Payer: Medicare Other

## 2013-02-18 ENCOUNTER — Other Ambulatory Visit (INDEPENDENT_AMBULATORY_CARE_PROVIDER_SITE_OTHER): Payer: Medicare Other

## 2013-02-18 DIAGNOSIS — D518 Other vitamin B12 deficiency anemias: Secondary | ICD-10-CM

## 2013-02-18 DIAGNOSIS — D519 Vitamin B12 deficiency anemia, unspecified: Secondary | ICD-10-CM

## 2013-02-18 DIAGNOSIS — D510 Vitamin B12 deficiency anemia due to intrinsic factor deficiency: Secondary | ICD-10-CM

## 2013-02-18 DIAGNOSIS — R627 Adult failure to thrive: Secondary | ICD-10-CM

## 2013-02-18 LAB — TSH: TSH: 1.46 u[IU]/mL (ref 0.35–5.50)

## 2013-02-18 LAB — HEMOGLOBIN A1C: Hgb A1c MFr Bld: 6.3 % (ref 4.6–6.5)

## 2013-02-18 MED ORDER — CYANOCOBALAMIN 1000 MCG/ML IJ SOLN
1000.0000 ug | Freq: Once | INTRAMUSCULAR | Status: AC
  Administered 2013-02-18: 1000 ug via INTRAMUSCULAR

## 2013-02-28 ENCOUNTER — Encounter: Payer: Self-pay | Admitting: Internal Medicine

## 2013-02-28 ENCOUNTER — Other Ambulatory Visit (INDEPENDENT_AMBULATORY_CARE_PROVIDER_SITE_OTHER): Payer: Medicare Other

## 2013-02-28 ENCOUNTER — Ambulatory Visit (INDEPENDENT_AMBULATORY_CARE_PROVIDER_SITE_OTHER): Payer: Medicare Other | Admitting: Internal Medicine

## 2013-02-28 ENCOUNTER — Ambulatory Visit (INDEPENDENT_AMBULATORY_CARE_PROVIDER_SITE_OTHER)
Admission: RE | Admit: 2013-02-28 | Discharge: 2013-02-28 | Disposition: A | Discharge status: Home / Self Care | Payer: Medicare Other | Source: Ambulatory Visit | Attending: Internal Medicine | Admitting: Internal Medicine

## 2013-02-28 VITALS — BP 106/56 | HR 121 | Ht 65.5 in | Wt 121.2 lb

## 2013-02-28 DIAGNOSIS — J479 Bronchiectasis, uncomplicated: Secondary | ICD-10-CM

## 2013-02-28 LAB — COMPREHENSIVE METABOLIC PANEL
AST: 15 U/L (ref 0–37)
Albumin: 2.9 g/dL — ABNORMAL LOW (ref 3.5–5.2)
Alkaline Phosphatase: 89 U/L (ref 39–117)
Chloride: 102 mEq/L (ref 96–112)
Glucose, Bld: 120 mg/dL — ABNORMAL HIGH (ref 70–99)
Potassium: 3.7 mEq/L (ref 3.5–5.1)
Sodium: 138 mEq/L (ref 135–145)
Total Protein: 7.7 g/dL (ref 6.0–8.3)

## 2013-02-28 LAB — CBC WITH DIFFERENTIAL/PLATELET
Eosinophils Absolute: 0.2 10*3/uL (ref 0.0–0.7)
Eosinophils Relative: 2.6 % (ref 0.0–5.0)
Lymphocytes Relative: 14.1 % (ref 12.0–46.0)
MCV: 89.4 fl (ref 78.0–100.0)
Monocytes Absolute: 0.4 10*3/uL (ref 0.1–1.0)
Neutrophils Relative %: 78.7 % — ABNORMAL HIGH (ref 43.0–77.0)
Platelets: 289 10*3/uL (ref 150.0–400.0)
WBC: 8.9 10*3/uL (ref 4.5–10.5)

## 2013-02-28 NOTE — Progress Notes (Signed)
Quick Note:  Pt aware of results. ______ 

## 2013-02-28 NOTE — Progress Notes (Signed)
Patient ID: Stacy Novak, female    DOB: 04/23/1941, 72 y.o.   MRN: [id number]  HPI 11/30/10-72 yo F never smoker, followed for bronchiectasis/ COPD, with hx treated atypical AFB/ MAIC, occasional hemoptysis and GERD.  Last here July 29, 2010 after normal flora on sputum cultures in October. Last antibiotic was augmentin in December. She questions if she should have had longer than 1 week treatment. Daily productive cough- yellow to light green. Shortness of breath with exertion across parking lot, up steps. . She does use Flutter occasionally. . No recent blood, fever, sweats or chest pain. Trial of Daliresp caused malaise.   03/01/11-72 yo F never smoker, followed for bronchiectasis/ COPD, with hx treated atypical AFB/ MAIC, occasional hemoptysis and GERD. No acute issues "just can't breathe and walk". She has avoided the weather extremes recently by staying in.  Cough always productive, greenish. Denies fever, sweat.  CXR- 11/30/10- reviewed with her- no definite new areas, but significant cystic bronchiectasis diffusely with some air fluid levels. PFT- 12/21/10- FEV1 1.02/ 49%; FEV1/FVC 0.54; no response to BD; TLC 0.77; DLCO 0.58    Severe obstructive disease, mild restriction.  6MWT She asks to defer formal oxygen assessment till [facility] [place] [facility]. She has less cough in last few days, doesn't feel she could leave sputum specimen, but would like script for brand-name levaquin, which works better.   05/05/11- 72 yo F never smoker, followed for bronchiectasis/ COPD, with hx treated atypical AFB/ MAIC, occasional hemoptysis and GERD. We gave (she requested brand-name) levaquin to hold last visit. She felt she probably needed it because of pleuritic right chest pain that lasted 2-3 days, self limited. She isn't sure there was any change in her daily productive cough- usually white. Maybe some fever once. She waited because she had upset GI with a  little nausea and diarrhea. Still holding the levaquin.   07/06/11-  72 yo F never smoker, followed for bronchiectasis/ COPD, with hx treated atypical AFB/ MAIC, occasional hemoptysis and GERD. Has had flu vaccine. Has not taken the Levaquin prescription we gave previously. She was taking maintenance Zithromax to suppress bronchitis. She stayed off it for 2 weeks because of GI upset with cramps, after successfully taking it for 6 weeks. She has no GI discomfort now. She is going to try resuming maintenance Zithromax, but adding a probiotic. She did not tolerate Daliresp.  11/07/11-  72 yo F never smoker, followed for bronchiectasis/ COPD, with hx treated atypical AFB/ MAIC, occasional hemoptysis and GERD. Still coughing a lot, often productive, with looser mucus. Had tried maintenance Zithromax until she got GI upset. Remains on chronic Nexium for GERD. Discussed her recent reports of cardiac conduction problems with macrolide antibiotics. (REC- next visit- look at HR, chemistry)  01/19/12- 72 yo F never smoker, followed for bronchiectasis/ COPD, with hx treated atypical AFB/ MAIC, occasional hemoptysis and GERD. Coughing up blood-bright red; would happen off and on, but only on May 30 with no repeat today , . SOB and wheezing has increased. Always has some cough which has not changed. No easy bruising or other bleeding. No fever. Previous hemoptysis 2 or 3 years ago at which time we stopped the baby aspirin.  03/19/12-  72 yo F never smoker, followed for bronchiectasis/ COPD, with hx treated atypical AFB/ MAIC, occasional hemoptysis and GERD. Having increased SOB past 3 weeks; losing weight and unsure why; no energy.. Has lost 10 lbs since last visit.  140>130lbs. COPD assessment test (CAT) score 32/40. She complains of weakness, aching joints, weight loss. 2 episodes where she passed mucus per rectum-not blood. Dry eyes, nose and mouth times one month. Bloated with eating-takes her breath. Burning  she-asks B12 shot. Jittery. Has slept up in a chair for years because of chronic midthoracic back pain has not gone back to see Dr. [name] in a long time. She asks if all of this is a side effect of Nexium. We reviewed her radiology again. CXR 01/23/12-  IMPRESSION:  Stable chronic changes with scattered air-fluid levels, as  described above.  Original Report Authenticated By: [name], M.D.   04/02/12- 72 yo F never smoker, followed for  Cystic bronchiectasis/ COPD, with hx treated atypical AFB/ MAIC, occasional hemoptysis and GERD. Post [facility]-8[facility]-03/27/12 for dx bronchiectasis w/ acute exacerbation, COPD, anemia/ B12 deficiency, Failure to thrive.Treated vanc/ zithromax/ ceftazidime. Cultures all no growth. Severe diffuse cystic bronchiectasis. Had ID consult. ECHO 8/2- normal EF60%, unable to visualize for PA pressures. Treated B12 deficiency x 5 days, then outpat injection.  She has not re-established with [name]/ PCP and says there is not a current record there. Air conditioning broken, so she has been staying at their [name] place. Eating better, not gaining weight. Weak. Thick white mucus in mouth. No fever, chills, blood, pain, nodes.   04/17/12- [name]/ INF.DIS. DISEASE, PULMONARY D/T MYCOBACTERIA - [name], MD 04/17/2012 12:24 PM Signed  She is not doing well based on her weight loss and probable recent exacerbation. I reviewed her CXR with her, there is not a huge difference vs her previous studies from 11-2010. She is offered to restart her MAI therapy but she wants "to get her stomach straightened out". She has quit taking her prilosec/nexium and is relying on tums now.will get a repeat sputum AFB on her (she is given a specimen container and will return after she is able to provide a good specimen).  Needs improved nutrition and probably home O2 (i encouraged her multiple times to call [name] about this). We may need GI help with her stomach issues.  Will see her  back on October 3.   04/30/12-  72 yo F never smoker, followed for severe Cystic bronchiectasis/ COPD, with hx treated atypical AFB/ MAIC, occasional hemoptysis and GERD. Unable to tell any difference with using O2 QHS-does not use O2 during the day. Increased SOB-not getting any better; Unable to drink much water. Minor epistaxis blamed on dryness from her oxygen. Son thinks/blood-tinged sputum occasionally, otherwise yellowish or greenish. She had stopped Nexium, blaming it for her poor appetite-it seemed to cause bloating. Instead she now uses TUMS and we discussed his meds. Complains of burning in her feet attributed by her to B12 deficiency. I again asked her to get back with her primary physician.  05/14/12- 42 yo F never smoker, followed for severe Cystic bronchiectasis/ COPD, with hx treated atypical AFB/ MAIC, occasional hemoptysis and GERD  Husband here Sputum cx 03/22/12- Nl flora. ACUTE VISIT: increased cough-yellow in color and Increased SOB-getting worse. Cough is chronically productive. In the last few days has felt more short of breath, nonspecific. Had flu vaccine. Using saline nasal spray for nasal dryness on home oxygen at 2 L. She is taking a liquid antacid instead of Nexium and admits reduced by mouth intake. Claims foods makes her bloated CXR 03/22/12- I reviewed the images with patient and husband IMPRESSION:  No significant change in the severe chronic changes as above.  Original Report Authenticated By: Cyndie ChimeKEVIN G. DOVER, M.D.   07/30/12- 72 yo F never smoker, followed for severe Cystic bronchiectasis/ COPD, with hx treated atypical AFB/ MAIC, occasional hemoptysis and GERD   FOLLOWS FOR: not able to eat or drink enough; sores in nose; not wearing O2 every night due to bumps in nose, feels weak and like she needs fluids. Has lost 20 lbs over past year. Always feels hot at first to keep her home about 564 which is too cold for family. [place] [facility]  [place]. Always productive cough without change. Exertion makes her cough more productive. She will see Dr. Hatcher/Infectious Disease again in January. Continues oxygen 2 L per minute at night and as needed in the day. Never took last prescription for Augmentin in September because she lost the prescription. Wants to continue B12 shots. No numbness in her feet.  09/11/12- 89 yo F never smoker, followed for severe Cystic bronchiectasis/ COPD, with hx treated atypical AFB/ MAIC, occasional hemoptysis and GERD   ACUTE: 86% on RA on arrival-- Dr. Ninetta LightsHatcher/ ID saw her today and thought she should be seen, heard "rubbing" in her lungs today--diff breathing worsening over last "couple" weeks w even small activities-- dry cough, denies any wheezing or chest tightness.  Exercise tolerance always poor. No distinct event but may be more DOE in last 2 weeks. No change in chronic cough, sometimes productive of yellow green, occasional small streak of blood. No fever, nodes, palpitation or chest pain. Feet swell some, dependent. Very weak. Dyspnea and weakness caused by her lung disease interfere with routine activities- dressing, grooming, ambulating through home. She needs to be able to sit frequently, and needs assistance for sustained ambulation,. She should have a light wheel chair. I don't think she is strong enough to manage a regular weight chair by herself. .  Weight stable 121-123 since October. She didn't take augmentin when given because she says she wasn't taking enough fluids for it- discussed. Has O2 concentrator for sleep but tends to leave it off- prongs bother nose.   10/29/12- 72 yo F never smoker, followed for severe Cystic bronchiectasis/ COPD, with hx treated atypical AFB/ MAIC, occasional hemoptysis and GERD FOLLOWS FOR: Pt states she has not recently been wearing her O2 at night as she feels it makes her breathing worse during the day; States she has noticed increased SOB as well. 85% RA on  arrival-took approximately 2 minutes to reach 90% RA. She finally took the antibiotic we had given her. She stays short of breath but not wearing oxygen. Her portable tank is too heavy for her. We discussed alternatives including small concentrators. She has generally sought to minimize intervention and treatment. 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  Numerous air-fluid levels appear progressive and could be due to  infection.  Original Report Authenticated By: Rudie MeyerP. Gallerani, M.D.  11/29/12- 72 yo F never smoker, followed for severe Cystic bronchiectasis/ COPD, with hx treated atypical AFB/ MAIC, occasional hemoptysis and GERD    Husband is here. FOLLOWS FOR:Pt feels she gets worse when on O2( 2L/ Advanced); unsure of how her breathing is really doing We discussed her oxygen. She says she is "afraid" of oxygen tanks and uncomfortable working with them. We discussed portable concentrators. Her husband is aware that she frequently is not compliant with therapies.  12/11/12- 72 yo F never smoker, followed for severe Cystic bronchiectasis/ COPD, with hx treated atypical AFB/ MAIC,  occasional hemoptysis and GERD    Husband is here. Acute work-in today for increased cough and shortness of breath. She wanted a nebulizer treatment and Depo-Medrol. We discussed her B12 shots and I asked her to followup with her primary physician for long-term decision about this. She has a peripheral neuropathy but the biggest problems have been cachexia and some difficulty with medication compliance. She denies fever, chills, swollen glands. She has a followup appointment with infectious disease.  02/28/13-72 yo F never smoker, followed for severe Cystic bronchiectasis/ COPD, with hx treated atypical AFB/ MAIC, occasional hemoptysis and GERD   She is willing to use portable oxygen now that she has a Designer, jewellery. She never felt safe with compressed oxygen tanks. Otherwise  little has changed. She remains very weak with persistent cough which is sometimes productive of yellow or green sputum. No blood, fever or adenopathy. She desaturates easily with exertion but is comfortable at rest on room air. CXR 12/17/12 IMPRESSION:  Severe chronic cystic bronchiectasis with superimposed infection,  slightly progressed in the left midzone.  Original Report Authenticated By: Francene Boyers, M.D.   Review of Systems-See HPI Constitutional:   + weight loss, No-night sweats, No- Fevers, chills, +fatigue, lassitude. HEENT:   No headaches,  Difficulty swallowing,  Tooth/dental problems,  Sore throat,                No sneezing, itching, ear ache, nasal congestion, post nasal drip,  CV:  No chest pain, orthopnea, PND, swelling in lower extremities, anasarca, +dizziness, No-palpitations GI  No heartburn, indigestion, abdominal pain, nausea, vomiting, Resp: +Shortness of breath with exertion. , no recent coughing up of blood. .  Some minimal wheezing.  Skin: no rash or lesions. GU:   MS:  + joint pain or swelling.   Psych:  No change in mood or affect. No depression + anxiety.  No memory loss.  Objective:   Physical Exam General- Alert, Oriented, Affect-appropriate, Distress- none acute; thin. Looks chronically ill, wheelchair, O2 2L / concentrator 93$ Skin- rash-none, lesions- none, excoriation- none. +Dusky hands and feet Lymphadenopathy- none Head- atraumatic            Eyes- Gross vision intact, PERRLA, conjunctivae clear secretions            Ears- Hearing, canals- normal            Nose- Clear, No-Septal dev, mucus, polyps, erosion, perforation             Throat- Mallampati II , mucosa clear , drainage- none, tonsils- atrophic Neck- flexible , trachea midline, no stridor , thyroid nl, carotid no bruit Chest - symmetrical excursion , unlabored           Heart/CV- + remains regular,rapid RRR ., no murmur , no gallop  , no rub, nl s1 s2                            JVD+  full , edema +1, stasis changes- none, varices- none           Lung-  + bilateral  Rhonchi- clearer than last visit, unlabored, cough- mild , dullness-none, rub- none           Chest wall-  Abd-  Br/ Gen/ Rectal- Not done, not indicated Extrem- cyanosis- none, clubbing, none, atrophy- none, strength- nl Neuro- grossly intact to observation

## 2013-02-28 NOTE — Patient Instructions (Addendum)
Order- CXR- dx bronchiectasis              Lab- CBC w/ diff, CMET       Dx bronchiectasis  Dr Felicity Coyer should be the one to prescribe B12 shots for your hx of pernicious anemia. If you and she decide, we could give them up here in the allergy lab, if you wish.   Consider getting a generator if you need a power source for your O2 concentrator back-up.

## 2013-03-17 NOTE — Assessment & Plan Note (Signed)
Severe chronic lung disease is clinically not changed over the short-term. She is more willing to stay on oxygen now but she has never been very compliant with her therapies. Plan-chest x-ray, CBC, chemistry. Try to walk for endurance.

## 2013-03-18 ENCOUNTER — Ambulatory Visit (INDEPENDENT_AMBULATORY_CARE_PROVIDER_SITE_OTHER): Payer: Medicare Other | Admitting: *Deleted

## 2013-03-18 DIAGNOSIS — D518 Other vitamin B12 deficiency anemias: Secondary | ICD-10-CM

## 2013-03-18 DIAGNOSIS — D519 Vitamin B12 deficiency anemia, unspecified: Secondary | ICD-10-CM

## 2013-03-18 DIAGNOSIS — D510 Vitamin B12 deficiency anemia due to intrinsic factor deficiency: Secondary | ICD-10-CM

## 2013-03-18 MED ORDER — CYANOCOBALAMIN 1000 MCG/ML IJ SOLN
1000.0000 ug | Freq: Once | INTRAMUSCULAR | Status: AC
  Administered 2013-03-18: 1000 ug via INTRAMUSCULAR

## 2013-03-31 ENCOUNTER — Ambulatory Visit: Payer: Medicare Other | Admitting: Infectious Diseases

## 2013-04-08 ENCOUNTER — Ambulatory Visit: Payer: Medicare Other

## 2013-04-15 ENCOUNTER — Ambulatory Visit (INDEPENDENT_AMBULATORY_CARE_PROVIDER_SITE_OTHER): Payer: Medicare Other | Admitting: *Deleted

## 2013-04-15 DIAGNOSIS — D518 Other vitamin B12 deficiency anemias: Secondary | ICD-10-CM

## 2013-04-15 DIAGNOSIS — D519 Vitamin B12 deficiency anemia, unspecified: Secondary | ICD-10-CM

## 2013-04-15 MED ORDER — CYANOCOBALAMIN 1000 MCG/ML IJ SOLN
1000.0000 ug | Freq: Once | INTRAMUSCULAR | Status: AC
  Administered 2013-04-15: 1000 ug via INTRAMUSCULAR

## 2013-04-16 ENCOUNTER — Ambulatory Visit (INDEPENDENT_AMBULATORY_CARE_PROVIDER_SITE_OTHER): Payer: Medicare Other | Admitting: Infectious Diseases

## 2013-04-16 ENCOUNTER — Encounter: Payer: Self-pay | Admitting: Infectious Diseases

## 2013-04-16 VITALS — BP 114/69 | HR 120 | Temp 97.8°F | Ht 67.0 in | Wt 121.0 lb

## 2013-04-16 DIAGNOSIS — A310 Pulmonary mycobacterial infection: Secondary | ICD-10-CM

## 2013-04-16 DIAGNOSIS — R627 Adult failure to thrive: Secondary | ICD-10-CM

## 2013-04-16 NOTE — Progress Notes (Signed)
  Subjective:    Patient ID: Stacy Novak, female    DOB: Aug 28, 1940, 72 y.o.   MRN: [id number]  HPI 72 y.o. female PMH of Cystic Bronchiectasis, MAC, COPD, GERD. Had previously recieved a 1 yr course of E/B 2002. She relapsed in July of 2007 and was started on E/A/R (Cx showed MAI, S-Clofaz, Copywriter, advertising. I- Rifampin. Synergy S- E/R).  Had chest X-ray on 03/19/2012 with positive findings of extensive pulmonary parenchymal architectural distortion associated air fluid levels similar to multiple prior exams, referred 03-21-12 to [facility] hospital for further evaluation. Treated with Levaquin for possible Pneumonia on June/2013. She was offered Bronch in hospital but declined. Her sputum Cx was AFB (-). She was treated with azithro/ceftaz for 7 days and d/c home on 03-27-12 with no further anbx.  At her previous ID visit (05-29-12), she had AFB sputum cx that was (-) 07-12-12.  Had repeat CXR (02-28-13): Overall worsened aeration. Favored to be secondary to cystic bronchiectasis, likely post infectious. Ongoing superimposed infection is suspected, especially given multiple air-fluid levels. Mild cardiomegaly, accentuated by pectus excavatum deformity She was symptomatically worsened at that time and wanted to defer the antibiotics at that time (wanted to concentrate on wt gain).   She returns today feels about the same. Feels like she has been using the portable O2 more. Weight has been steady at 121#. Has been eating well, has trouble eating a lot due to bloating. Continued productive cough.   Review of Systems     Objective:   Physical Exam  Constitutional: She appears cachectic.  HENT:  Mouth/Throat: No oropharyngeal exudate.  Eyes: EOM are normal. Pupils are equal, round, and reactive to light.  Neck: Neck supple.  Cardiovascular: Tachycardia present.   Pulmonary/Chest: No respiratory distress. She has decreased breath sounds.    Abdominal: Soft. Bowel sounds are normal. She exhibits no distension. There  is no tenderness.  Musculoskeletal: She exhibits no edema.  Lymphadenopathy:    She has no cervical adenopathy.          Assessment & Plan:

## 2013-04-16 NOTE — Assessment & Plan Note (Addendum)
Offered to start her on marinol. She is concerned to start this due to her bloating. She wants to defer. She is concerned about her diet- eating more fast food.

## 2013-04-16 NOTE — Assessment & Plan Note (Addendum)
I offered her to restart her medication. She is hesitant to do this due to concerns about her appetite. Will see her back in 6-8 weeks. She refuses flu shot today.

## 2013-05-13 ENCOUNTER — Ambulatory Visit (INDEPENDENT_AMBULATORY_CARE_PROVIDER_SITE_OTHER): Payer: Medicare Other | Admitting: *Deleted

## 2013-05-13 DIAGNOSIS — D518 Other vitamin B12 deficiency anemias: Secondary | ICD-10-CM

## 2013-05-13 DIAGNOSIS — D519 Vitamin B12 deficiency anemia, unspecified: Secondary | ICD-10-CM

## 2013-05-13 MED ORDER — CYANOCOBALAMIN 1000 MCG/ML IJ SOLN
1000.0000 ug | Freq: Once | INTRAMUSCULAR | Status: AC
  Administered 2013-05-13: 1000 ug via INTRAMUSCULAR

## 2013-05-26 ENCOUNTER — Telehealth: Payer: Self-pay | Admitting: Internal Medicine

## 2013-05-26 NOTE — Telephone Encounter (Signed)
cmn came and was given to Jose Persia

## 2013-06-05 ENCOUNTER — Telehealth: Payer: Self-pay | Admitting: Internal Medicine

## 2013-06-05 NOTE — Telephone Encounter (Signed)
Called number provided, message stated [employer] customer not available. WCB. Stacy Novak, CMA

## 2013-06-06 NOTE — Telephone Encounter (Signed)
[email or web address].Horrace Hanak, CMA  

## 2013-06-09 NOTE — Telephone Encounter (Signed)
lmtcb x2 

## 2013-06-10 ENCOUNTER — Ambulatory Visit (INDEPENDENT_AMBULATORY_CARE_PROVIDER_SITE_OTHER): Payer: Medicare Other | Admitting: *Deleted

## 2013-06-10 DIAGNOSIS — D519 Vitamin B12 deficiency anemia, unspecified: Secondary | ICD-10-CM

## 2013-06-10 DIAGNOSIS — D518 Other vitamin B12 deficiency anemias: Secondary | ICD-10-CM

## 2013-06-10 MED ORDER — CYANOCOBALAMIN 1000 MCG/ML IJ SOLN
1000.0000 ug | Freq: Once | INTRAMUSCULAR | Status: AC
  Administered 2013-06-10: 1000 ug via INTRAMUSCULAR

## 2013-06-10 NOTE — Telephone Encounter (Signed)
Called spoke with patient who stated that she has gotten her flu shot.  Apologized to patient for the inconvenience and verified her home number with her as we had tried to contact her multiple times with no success.  Nothing further needed; will sign off.

## 2013-06-18 ENCOUNTER — Ambulatory Visit: Payer: Medicare Other | Admitting: Infectious Diseases

## 2013-06-24 ENCOUNTER — Telehealth: Payer: Self-pay | Admitting: Internal Medicine

## 2013-06-24 ENCOUNTER — Encounter: Payer: Self-pay | Admitting: Infectious Diseases

## 2013-06-24 ENCOUNTER — Ambulatory Visit (INDEPENDENT_AMBULATORY_CARE_PROVIDER_SITE_OTHER): Payer: Medicare Other | Admitting: Infectious Diseases

## 2013-06-24 VITALS — BP 130/75 | HR 109 | Ht 67.0 in | Wt 121.0 lb

## 2013-06-24 DIAGNOSIS — J479 Bronchiectasis, uncomplicated: Secondary | ICD-10-CM

## 2013-06-24 DIAGNOSIS — A310 Pulmonary mycobacterial infection: Secondary | ICD-10-CM

## 2013-06-24 NOTE — Progress Notes (Signed)
  Subjective:    Patient ID: Stacy Novak, female    DOB: [birth year], 72 y.o.   MRN: [id number]  HPI 72 y.o. female PMH of Cystic Bronchiectasis, MAC, COPD, GERD. Had previously recieved a 1 yr course of E/B 2002. She relapsed in July of 2007 and was started on E/A/R (Cx showed MAI, S-Clofaz, Copywriter, advertising. I- Rifampin. Synergy S- E/R).  Had chest X-ray on 03/19/2012 with positive findings of extensive pulmonary parenchymal architectural distortion associated air fluid levels similar to multiple prior exams, referred 03-21-12 to [facility] hospital for further evaluation. Treated with Levaquin for possible Pneumonia on June/2013. She was offered Bronch in hospital but declined. Her sputum Cx was AFB (-). She was treated with azithro/ceftaz for 7 days and d/c home on 03-27-12 with no further anbx.  She had AFB sputum cx that was (-) 07-12-12.  Had repeat CXR (02-28-13): Overall worsened aeration. Favored to be secondary to cystic bronchiectasis, likely post infectious. Ongoing superimposed infection is suspected, especially given multiple air-fluid levels. Mild cardiomegaly, accentuated by pectus excavatum deformity  She was symptomatically worsened at that time and wanted to defer the antibiotics at that time (wanted to concentrate on wt gain).  Was seen in ID clinic 03-2013 and offered to start on MAI tx. She defered.   Today feels she has been more SOB, having DOE with minimal exertion. States she is just trying to stay still. Has been using her home O2 more, usually at 2L, occas up to 3L with exertion. Asks about using ibuprofen for lung regeneration.  She has multiple episodes of coughing in clinic. States that eating, talking makes her cough more. Cough is productive of creamy/brown. No f/c.   Review of Systems  Constitutional: Negative for fever and chills.  Respiratory: Positive for cough and shortness of breath.        Objective:   Physical Exam  Constitutional: She appears cachectic.  HENT:   Mouth/Throat: No oropharyngeal exudate.  Eyes: EOM are normal. Pupils are equal, round, and reactive to light.  Neck: Neck supple.  Cardiovascular: Normal heart sounds.  Tachycardia present.   Pulmonary/Chest: Accessory muscle usage present. Tachypnea noted. She has decreased breath sounds. She has rhonchi.  Abdominal: Soft. Bowel sounds are normal. She exhibits no distension. There is no tenderness.  Lymphadenopathy:    She has no cervical adenopathy.          Assessment & Plan:

## 2013-06-24 NOTE — Telephone Encounter (Signed)
i spoke with Stacy Novak. She reports pt on 2 liters O2 and has been in low 80's w/ O2. She has been having coughing spells and feeling SOB. Pt is going to be worked in with Dr. Maple Hudson tomorrow at 11 AM. Nothing further needed

## 2013-06-24 NOTE — Assessment & Plan Note (Addendum)
She is significantly worse. She does not want to take "triple antibiotics, maybe just one". Her anbx history is reviewed with her. Will send to her to ED for eval for admission, CXR, ABG.

## 2013-06-24 NOTE — Assessment & Plan Note (Addendum)
Will have her seen by [name] as soon as she can be worked in. Does she need steroids?  Upon further consideration, will send her to ED given her HR, RR, persitent cough.

## 2013-06-25 ENCOUNTER — Ambulatory Visit (INDEPENDENT_AMBULATORY_CARE_PROVIDER_SITE_OTHER): Payer: Medicare Other | Admitting: Internal Medicine

## 2013-06-25 ENCOUNTER — Other Ambulatory Visit: Payer: Medicare Other

## 2013-06-25 ENCOUNTER — Encounter: Payer: Self-pay | Admitting: Internal Medicine

## 2013-06-25 VITALS — BP 120/68 | HR 129 | Ht 65.5 in | Wt 121.8 lb

## 2013-06-25 DIAGNOSIS — J479 Bronchiectasis, uncomplicated: Secondary | ICD-10-CM

## 2013-06-25 DIAGNOSIS — R627 Adult failure to thrive: Secondary | ICD-10-CM

## 2013-06-25 DIAGNOSIS — Z23 Encounter for immunization: Secondary | ICD-10-CM

## 2013-06-25 NOTE — Progress Notes (Signed)
Patient ID: Stacy Novak, female    DOB: 04/23/1941, 72 y.o.   MRN: [id number]  HPI 11/30/10-72 yo F never smoker, followed for bronchiectasis/ COPD, with hx treated atypical AFB/ MAIC, occasional hemoptysis and GERD.  Last here July 29, 2010 after normal flora on sputum cultures in October. Last antibiotic was augmentin in December. She questions if she should have had longer than 1 week treatment. Daily productive cough- yellow to light green. Shortness of breath with exertion across parking lot, up steps. . She does use Flutter occasionally. . No recent blood, fever, sweats or chest pain. Trial of Daliresp caused malaise.   03/01/11-72 yo F never smoker, followed for bronchiectasis/ COPD, with hx treated atypical AFB/ MAIC, occasional hemoptysis and GERD. No acute issues "just can't breathe and walk". She has avoided the weather extremes recently by staying in.  Cough always productive, greenish. Denies fever, sweat.  CXR- 11/30/10- reviewed with her- no definite new areas, but significant cystic bronchiectasis diffusely with some air fluid levels. PFT- 12/21/10- FEV1 1.02/ 49%; FEV1/FVC 0.54; no response to BD; TLC 0.77; DLCO 0.58    Severe obstructive disease, mild restriction.  6MWT She asks to defer formal oxygen assessment till [facility]. She has less cough in last few days, doesn't feel she could leave sputum specimen, but would like script for brand-name levaquin, which works better.   05/05/11- 72 yo F never smoker, followed for bronchiectasis/ COPD, with hx treated atypical AFB/ MAIC, occasional hemoptysis and GERD. We gave (she requested brand-name) levaquin to hold last visit. She felt she probably needed it because of pleuritic right chest pain that lasted 2-3 days, self limited. She isn't sure there was any change in her daily productive cough- usually white. Maybe some fever once. She waited because she had upset GI with a  little nausea and diarrhea. Still holding the levaquin.   07/06/11-  72 yo F never smoker, followed for bronchiectasis/ COPD, with hx treated atypical AFB/ MAIC, occasional hemoptysis and GERD. Has had flu vaccine. Has not taken the Levaquin prescription we gave previously. She was taking maintenance Zithromax to suppress bronchitis. She stayed off it for 2 weeks because of GI upset with cramps, after successfully taking it for 6 weeks. She has no GI discomfort now. She is going to try resuming maintenance Zithromax, but adding a probiotic. She did not tolerate Daliresp.  11/07/11-  72 yo F never smoker, followed for bronchiectasis/ COPD, with hx treated atypical AFB/ MAIC, occasional hemoptysis and GERD. Still coughing a lot, often productive, with looser mucus. Had tried maintenance Zithromax until she got GI upset. Remains on chronic Nexium for GERD. Discussed her recent reports of cardiac conduction problems with macrolide antibiotics. (REC- next visit- look at HR, chemistry)  01/19/12- 72 yo F never smoker, followed for bronchiectasis/ COPD, with hx treated atypical AFB/ MAIC, occasional hemoptysis and GERD. Coughing up blood-bright red; would happen off and on, but only on May 30 with no repeat today , . SOB and wheezing has increased. Always has some cough which has not changed. No easy bruising or other bleeding. No fever. Previous hemoptysis 2 or 3 years ago at which time we stopped the baby aspirin.  03/19/12-  72 yo F never smoker, followed for bronchiectasis/ COPD, with hx treated atypical AFB/ MAIC, occasional hemoptysis and GERD. Having increased SOB past 3 weeks; losing weight and unsure why; no energy.. Has lost 10 lbs since last visit.  140>130lbs. COPD assessment test (CAT) score 32/40. She complains of weakness, aching joints, weight loss. 2 episodes where she passed mucus per rectum-not blood. Dry eyes, nose and mouth times one month. Bloated with eating-takes her breath. Burning  she-asks B12 shot. Jittery. Has slept up in a chair for years because of chronic midthoracic back pain has not gone back to see Dr. [name] in a long time. She asks if all of this is a side effect of Nexium. We reviewed her radiology again. CXR 01/23/12-  IMPRESSION:  Stable chronic changes with scattered air-fluid levels, as  described above.  Original Report Authenticated By: [name], M.D.   04/02/12- 59 yo F never smoker, followed for  Cystic bronchiectasis/ COPD, with hx treated atypical AFB/ MAIC, occasional hemoptysis and GERD. [facility]03[facility]12 for dx bronchiectasis w/ acute exacerbation, COPD, anemia/ B12 deficiency, Failure to thrive.Treated vanc/ zithromax/ ceftazidime. Cultures all no growth. Severe diffuse cystic bronchiectasis. Had ID consult. ECHO 8/2- normal EF60%, unable to visualize for PA pressures. Treated B12 deficiency x 5 days, then outpat injection.  She has not re-established with [name]/ PCP and says there is not a current record there. Air conditioning broken, so she has been staying at their [name] place. Eating better, not gaining weight. Weak. Thick white mucus in mouth. No fever, chills, blood, pain, nodes.   04/17/12- [name]/ INF.DIS. DISEASE, PULMONARY D/T MYCOBACTERIA - [name], MD 04/17/2012 12:24 PM Signed  She is not doing well based on her weight loss and probable recent exacerbation. I reviewed her CXR with her, there is not a huge difference vs her previous studies from 11-2010. She is offered to restart her MAI therapy but she wants "to get her stomach straightened out". She has quit taking her prilosec/nexium and is relying on tums now.will get a repeat sputum AFB on her (she is given a specimen container and will return after she is able to provide a good specimen).  Needs improved nutrition and probably home O2 (i encouraged her multiple times to call [name] about this). We may need GI help with her stomach issues.  Will see her  back on October 3.   04/30/12-  58 yo F never smoker, followed for severe Cystic bronchiectasis/ COPD, with hx treated atypical AFB/ MAIC, occasional hemoptysis and GERD. Unable to tell any difference with using O2 QHS-does not use O2 during the day. Increased SOB-not getting any better; Unable to drink much water. Minor epistaxis blamed on dryness from her oxygen. Son thinks/blood-tinged sputum occasionally, otherwise yellowish or greenish. She had stopped Nexium, blaming it for her poor appetite-it seemed to cause bloating. Instead she now uses TUMS and we discussed his meds. Complains of burning in her feet attributed by her to B12 deficiency. I again asked her to get back with her primary physician.  05/14/12- 42 yo F never smoker, followed for severe Cystic bronchiectasis/ COPD, with hx treated atypical AFB/ MAIC, occasional hemoptysis and GERD  Husband here Sputum cx 03/22/12- Nl flora. ACUTE VISIT: increased cough-yellow in color and Increased SOB-getting worse. Cough is chronically productive. In the last few days has felt more short of breath, nonspecific. Had flu vaccine. Using saline nasal spray for nasal dryness on home oxygen at 2 L. She is taking a liquid antacid instead of Nexium and admits reduced by mouth intake. Claims foods makes her bloated CXR 03/22/12- I reviewed the images with patient and husband IMPRESSION:  No significant change in the severe chronic changes as above.  Original Report Authenticated By: Cyndie ChimeKEVIN G. DOVER, M.D.   07/30/12- 72 yo F never smoker, followed for severe Cystic bronchiectasis/ COPD, with hx treated atypical AFB/ MAIC, occasional hemoptysis and GERD   FOLLOWS FOR: not able to eat or drink enough; sores in nose; not wearing O2 every night due to bumps in nose, feels weak and like she needs fluids. Has lost 20 lbs over past year. Always feels hot at first to keep her home about 564 which is too cold for family. [facility]  [place]. Always productive cough without change. Exertion makes her cough more productive. She will see Dr. Hatcher/Infectious Disease again in January. Continues oxygen 2 L per minute at night and as needed in the day. Never took last prescription for Augmentin in September because she lost the prescription. Wants to continue B12 shots. No numbness in her feet.  09/11/12- 89 yo F never smoker, followed for severe Cystic bronchiectasis/ COPD, with hx treated atypical AFB/ MAIC, occasional hemoptysis and GERD   ACUTE: 86% on RA on arrival-- Dr. Ninetta LightsHatcher/ ID saw her today and thought she should be seen, heard "rubbing" in her lungs today--diff breathing worsening over last "couple" weeks w even small activities-- dry cough, denies any wheezing or chest tightness.  Exercise tolerance always poor. No distinct event but may be more DOE in last 2 weeks. No change in chronic cough, sometimes productive of yellow green, occasional small streak of blood. No fever, nodes, palpitation or chest pain. Feet swell some, dependent. Very weak. Dyspnea and weakness caused by her lung disease interfere with routine activities- dressing, grooming, ambulating through home. She needs to be able to sit frequently, and needs assistance for sustained ambulation,. She should have a light wheel chair. I don't think she is strong enough to manage a regular weight chair by herself. .  Weight stable 121-123 since October. She didn't take augmentin when given because she says she wasn't taking enough fluids for it- discussed. Has O2 concentrator for sleep but tends to leave it off- prongs bother nose.   10/29/12- 72 yo F never smoker, followed for severe Cystic bronchiectasis/ COPD, with hx treated atypical AFB/ MAIC, occasional hemoptysis and GERD FOLLOWS FOR: Pt states she has not recently been wearing her O2 at night as she feels it makes her breathing worse during the day; States she has noticed increased SOB as well. 85% RA on  arrival-took approximately 2 minutes to reach 90% RA. She finally took the antibiotic we had given her. She stays short of breath but not wearing oxygen. Her portable tank is too heavy for her. We discussed alternatives including small concentrators. She has generally sought to minimize intervention and treatment. 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  Numerous air-fluid levels appear progressive and could be due to  infection.  Original Report Authenticated By: Rudie MeyerP. Gallerani, M.D.  11/29/12- 72 yo F never smoker, followed for severe Cystic bronchiectasis/ COPD, with hx treated atypical AFB/ MAIC, occasional hemoptysis and GERD    Husband is here. FOLLOWS FOR:Pt feels she gets worse when on O2( 2L/ Advanced); unsure of how her breathing is really doing We discussed her oxygen. She says she is "afraid" of oxygen tanks and uncomfortable working with them. We discussed portable concentrators. Her husband is aware that she frequently is not compliant with therapies.  12/11/12- 72 yo F never smoker, followed for severe Cystic bronchiectasis/ COPD, with hx treated atypical AFB/ MAIC,  occasional hemoptysis and GERD    Husband is here. Acute work-in today for increased cough and shortness of breath. She wanted a nebulizer treatment and Depo-Medrol. We discussed her B12 shots and I asked her to followup with her primary physician for long-term decision about this. She has a peripheral neuropathy but the biggest problems have been cachexia and some difficulty with medication compliance. She denies fever, chills, swollen glands. She has a followup appointment with infectious disease.  02/28/13-72 yo F never smoker, followed for severe Cystic bronchiectasis/ COPD, with hx treated atypical AFB/ MAIC, occasional hemoptysis and GERD   She is willing to use portable oxygen now that she has a Designer, jewelleryportable concentrator. She never felt safe with compressed oxygen tanks. Otherwise  little has changed. She remains very weak with persistent cough which is sometimes productive of yellow or green sputum. No blood, fever or adenopathy. She desaturates easily with exertion but is comfortable at rest on room air. CXR 12/17/12 IMPRESSION:  Severe chronic cystic bronchiectasis with superimposed infection,  slightly progressed in the left midzone.  Original Report Authenticated By: Francene BoyersJames Maxwell, M.D.   Review of Systems-See HPI Constitutional:   + weight loss, No-night sweats, No- Fevers, chills, +fatigue, lassitude. HEENT:   No headaches,  Difficulty swallowing,  Tooth/dental problems,  Sore throat,                No sneezing, itching, ear ache, nasal congestion, post nasal drip,  CV:  No chest pain, orthopnea, PND, swelling in lower extremities, anasarca, +dizziness, No-palpitations GI  No heartburn, indigestion, abdominal pain, nausea, vomiting, Resp: +Shortness of breath with exertion. , no recent coughing up of blood. .  Some minimal wheezing.  Skin: no rash or lesions. GU:   MS:  + joint pain or swelling.   Psych:  No change in mood or affect. No depression + anxiety.  No memory loss.  Objective:   Physical Exam General- Alert, Oriented, Affect-appropriate, Distress- none acute; thin. Looks chronically ill, wheelchair, O2 2L / concentrator 93$ Skin- rash-none, lesions- none, excoriation- none. +Dusky hands and feet Lymphadenopathy- none Head- atraumatic            Eyes- Gross vision intact, PERRLA, conjunctivae clear secretions            Ears- Hearing, canals- normal            Nose- Clear, No-Septal dev, mucus, polyps, erosion, perforation             Throat- Mallampati II , mucosa clear , drainage- none, tonsils- atrophic Neck- flexible , trachea midline, no stridor , thyroid nl, carotid no bruit Chest - symmetrical excursion , unlabored           Heart/CV- + remains regular,rapid RRR ., no murmur , no gallop  , no rub, nl s1 s2                            JVD+  full , edema +1, stasis changes- none, varices- none           Lung-  + bilateral  Rhonchi- clearer than last visit, unlabored, cough- mild , dullness-none, rub- none           Chest wall-  Abd-  Br/ Gen/ Rectal- Not done, not indicated Extrem- cyanosis- none, clubbing, none, atrophy- none, strength- nl Neuro- grossly intact to observation             Patient ID: Carney BernJean  Alphonzo Severance, female    DOB: 22-Jul-1941, 72 y.o.   MRN: [id number]  HPI 11/30/10-72 yo F never smoker, followed for bronchiectasis/ COPD, with hx treated atypical AFB/ MAIC, occasional hemoptysis and GERD.  Last here July 29, 2010 after normal flora on sputum cultures in October. Last antibiotic was augmentin in December. She questions if she should have had longer than 1 week treatment. Daily productive cough- yellow to light green. Shortness of breath with exertion across parking lot, up steps. . She does use Flutter occasionally. . No recent blood, fever, sweats or chest pain. Trial of Daliresp caused malaise.   03/01/11-72 yo F never smoker, followed for bronchiectasis/ COPD, with hx treated atypical AFB/ MAIC, occasional hemoptysis and GERD. No acute issues "just can't breathe and walk". She has avoided the weather extremes recently by staying in.  Cough always productive, greenish. Denies fever, sweat.  CXR- 11/30/10- reviewed with her- no definite new areas, but significant cystic bronchiectasis diffusely with some air fluid levels. PFT- 12/21/10- FEV1 1.02/ 49%; FEV1/FVC 0.54; no response to BD; TLC 0.77; DLCO 0.58    Severe obstructive disease, mild restriction.  She asks to defer formal oxygen assessment till she finishes spending summer at [facility] with her grandchildren. She has less cough in last few days, doesn't feel she could leave sputum specimen, but would like script for brand-name levaquin, which works better.   05/05/11- 84 yo F never smoker, followed for bronchiectasis/ COPD, with hx treated atypical  AFB/ MAIC, occasional hemoptysis and GERD. We gave (she requested brand-name) levaquin to hold last visit. She felt she probably needed it because of pleuritic right chest pain that lasted 2-3 days, self limited. She isn't sure there was any change in her daily productive cough- usually white. Maybe some fever once. She waited because she had upset GI with a little nausea and diarrhea. Still holding the levaquin.   07/06/11-  14 yo F never smoker, followed for bronchiectasis/ COPD, with hx treated atypical AFB/ MAIC, occasional hemoptysis and GERD. Has had flu vaccine. Has not taken the Levaquin prescription we gave previously. She was taking maintenance Zithromax to suppress bronchitis. She stayed off it for 2 weeks because of GI upset with cramps, after successfully taking it for 6 weeks. She has no GI discomfort now. She is going to try resuming maintenance Zithromax, but adding a probiotic. She did not tolerate Daliresp.  11/07/11-  2 yo F never smoker, followed for bronchiectasis/ COPD, with hx treated atypical AFB/ MAIC, occasional hemoptysis and GERD. Still coughing a lot, often productive, with looser mucus. Had tried maintenance Zithromax until she got GI upset. Remains on chronic Nexium for GERD. Discussed her recent reports of cardiac conduction problems with macrolide antibiotics. (REC- next visit- look at HR, chemistry)  01/19/12- 58 yo F never smoker, followed for bronchiectasis/ COPD, with hx treated atypical AFB/ MAIC, occasional hemoptysis and GERD. Coughing up blood-bright red; would happen off and on, but only on May 30 with no repeat today , . SOB and wheezing has increased. Always has some cough which has not changed. No easy bruising or other bleeding. No fever. Previous hemoptysis 2 or 3 years ago at which time we stopped the baby aspirin.  03/19/12-  49 yo F never smoker, followed for bronchiectasis/ COPD, with hx treated atypical AFB/ MAIC, occasional hemoptysis and  GERD. Having increased SOB past 3 weeks; losing weight and unsure why; no energy.. Has lost 10 lbs since last visit. 140>130lbs. COPD assessment test (  CAT) score 32/40. She complains of weakness, aching joints, weight loss. 2 episodes where she passed mucus per rectum-not blood. Dry eyes, nose and mouth times one month. Bloated with eating-takes her breath. Burning she-asks B12 shot. Jittery. Has slept up in a chair for years because of chronic midthoracic back pain has not gone back to see Dr. Ricki MillerPang in a long time. She asks if all of this is a side effect of Nexium. We reviewed her radiology again. CXR 01/23/12-  IMPRESSION:  Stable chronic changes with scattered air-fluid levels, as  described above.  Original Report Authenticated By: Charline BillsSRIYESH KRISHNAN, M.D.   04/02/12- 72 yo F never smoker, followed for  Cystic bronchiectasis/ COPD, with hx treated atypical AFB/ MAIC, occasional hemoptysis and GERD. Post Hospital-8[facility]1-03[facility]27[facility]12 for dx bronchiectasis w/ acute exacerbation, COPD, anemia/ B12 deficiency, Failure to thrive.Treated vanc/ zithromax/ ceftazidime. Cultures all no growth. Severe diffuse cystic bronchiectasis. Had ID consult. ECHO 8/2- normal EF60%, unable to visualize for PA pressures. Treated B12 deficiency x 5 days, then outpat injection.  She has not re-established with [name]/ PCP and says there is not a current record there. Air conditioning broken, so [facility]. Eating better, not gaining weight. Weak. Thick white mucus in mouth. No fever, chills, blood, pain, nodes.   04/17/12- [name]/ INF.DIS. DISEASE, PULMONARY D/T MYCOBACTERIA - Johny SaxJeffrey Hatcher, MD 04/17/2012 12:24 PM Signed  She is not doing well based on her weight loss and probable recent exacerbation. I reviewed her CXR with her, there is not a huge difference vs her previous studies from 11-2010. She is offered to restart her MAI therapy but she wants "to get her stomach straightened out".  She has quit taking her prilosec/nexium and is relying on tums now.will get a repeat sputum AFB on her (she is given a specimen container and will return after she is able to provide a good specimen).  Needs improved nutrition and probably home O2 (i encouraged her multiple times to call Dr Maple HudsonYoung about this). We may need GI help with her stomach issues.  Will see her back on October 3.   04/30/12-  72 yo F never smoker, followed for severe Cystic bronchiectasis/ COPD, with hx treated atypical AFB/ MAIC, occasional hemoptysis and GERD. Unable to tell any difference with using O2 QHS-does not use O2 during the day. Increased SOB-not getting any better; Unable to drink much water. Minor epistaxis blamed on dryness from her oxygen. Son thinks/blood-tinged sputum occasionally, otherwise yellowish or greenish. She had stopped Nexium, blaming it for her poor appetite-it seemed to cause bloating. Instead she now uses TUMS and we discussed his meds. Complains of burning in her feet attributed by her to B12 deficiency. I again asked her to get back with her primary physician.  05/14/12- 72 yo F never smoker, followed for severe Cystic bronchiectasis/ COPD, with hx treated atypical AFB/ MAIC, occasional hemoptysis and GERD  Husband here Sputum cx 03/22/12- Nl flora. ACUTE VISIT: increased cough-yellow in color and Increased SOB-getting worse. Cough is chronically productive. In the last few days has felt more short of breath, nonspecific. Had flu vaccine. Using saline nasal spray for nasal dryness on home oxygen at 2 L. She is taking a liquid antacid instead of Nexium and admits reduced by mouth intake. Claims foods makes her bloated CXR 03/22/12- I reviewed the images with patient and husband IMPRESSION:  No significant change in the severe chronic changes as above.  Original Report Authenticated By:  Cyndie Chime, M.D.   07/30/12- 42 yo F never smoker, followed for severe Cystic bronchiectasis/ COPD, with hx  treated atypical AFB/ MAIC, occasional hemoptysis and GERD   FOLLOWS FOR: not able to eat or drink enough; sores in nose; not wearing O2 every night due to bumps in nose, feels weak and like she needs fluids. Has lost 20 lbs over past year. Always feels hot at first to keep her home about 21 which is too cold for family. Still spending most of [facility]. Always productive cough without change. Exertion makes her cough more productive. She will see Dr. Hatcher/Infectious Disease again in January. Continues oxygen 2 L per minute at night and as needed in the day. Never took last prescription for Augmentin in September because she lost the prescription. Wants to continue B12 shots. No numbness in her feet.  09/11/12- 57 yo F never smoker, followed for severe Cystic bronchiectasis/ COPD, with hx treated atypical AFB/ MAIC, occasional hemoptysis and GERD   ACUTE: 86% on RA on arrival-- Dr. Ninetta Lights ID saw her today and thought she should be seen, heard "rubbing" in her lungs today--diff breathing worsening over last "couple" weeks w even small activities-- dry cough, denies any wheezing or chest tightness.  Exercise tolerance always poor. No distinct event but may be more DOE in last 2 weeks. No change in chronic cough, sometimes productive of yellow green, occasional small streak of blood. No fever, nodes, palpitation or chest pain. Feet swell some, dependent. Very weak. Dyspnea and weakness caused by her lung disease interfere with routine activities- dressing, grooming, ambulating through home. She needs to be able to sit frequently, and needs assistance for sustained ambulation,. She should have a light wheel chair. I don't think she is strong enough to manage a regular weight chair by herself. .  Weight stable 121-123 since October. She didn't take augmentin when given because she says she wasn't taking enough fluids for it- discussed. Has O2 concentrator for sleep but tends to leave it  off- prongs bother nose.   10/29/12- 71 yo F never smoker, followed for severe Cystic bronchiectasis/ COPD, with hx treated atypical AFB/ MAIC, occasional hemoptysis and GERD FOLLOWS FOR: Pt states she has not recently been wearing her O2 at night as she feels it makes her breathing worse during the day; States she has noticed increased SOB as well. 85% RA on arrival-took approximately 2 minutes to reach 90% RA. She finally took the antibiotic we had given her. She stays short of breath but not wearing oxygen. Her portable tank is too heavy for her. We discussed alternatives including small concentrators. She has generally sought to minimize intervention and treatment. 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  Numerous air-fluid levels appear progressive and could be due to  infection.  Original Report Authenticated By: Rudie Meyer, M.D.  11/29/12- 60 yo F never smoker, followed for severe Cystic bronchiectasis/ COPD, with hx treated atypical AFB/ MAIC, occasional hemoptysis and GERD    Husband is here. FOLLOWS FOR:Pt feels she gets worse when on O2( 2L/ Advanced); unsure of how her breathing is really doing We discussed her oxygen. She says she is "afraid" of oxygen tanks and uncomfortable working with them. We discussed portable concentrators. Her husband is aware that she frequently is not compliant with therapies.  12/11/12- 5 yo F never smoker, followed for severe Cystic bronchiectasis/ COPD, with hx treated atypical AFB/ MAIC, occasional hemoptysis and GERD  Husband is here. Acute work-in today for increased cough and shortness of breath. She wanted a nebulizer treatment and Depo-Medrol. We discussed her B12 shots and I asked her to followup with her primary physician for long-term decision about this. She has a peripheral neuropathy but the biggest problems have been cachexia and some difficulty with medication compliance. She denies fever, chills,  swollen glands. She has a followup appointment with infectious disease.  02/28/13-72 yo F never smoker, followed for severe Cystic bronchiectasis/ COPD, with hx treated atypical AFB/ MAIC, occasional hemoptysis and GERD   She is willing to use portable oxygen now that she has a Designer, jewellery. She never felt safe with compressed oxygen tanks. Otherwise little has changed. She remains very weak with persistent cough which is sometimes productive of yellow or green sputum. No blood, fever or adenopathy. She desaturates easily with exertion but is comfortable at rest on room air. CXR 12/17/12 IMPRESSION:  Severe chronic cystic bronchiectasis with superimposed infection,  slightly progressed in the left midzone.  Original Report Authenticated By: Francene Boyers, M.D.  06/25/13- 48 yo F never smoker, followed for severe Cystic bronchiectasis/ COPD, with hx treated atypical AFB/ MAIC, occasional hemoptysis and GERD    Husband here FOLLOWS FOR: Pt states her breathing has gotten worse; was seen by Dr Ninetta Lights yesterday and her O2 levels were dropping. Told to follow up today. She says she desaturated while coughing at Dr Moshe Cipro. More SOB/ DOE 3 weeks. Chronic cough not changed- brownish, no blood, fever or chills. She tries using Flutter but it is not helping enough. We discussed pneumatic VEST. O2 2-3L/Min Imogen portable concentrator.   Review of Systems-See HPI Constitutional:   + weight loss, No-night sweats, No- Fevers, chills, +fatigue, lassitude. HEENT:   No headaches,  Difficulty swallowing,  Tooth/dental problems,  Sore throat,                No sneezing, itching, ear ache, nasal congestion, post nasal drip,  CV:  No chest pain, orthopnea, PND, swelling in lower extremities, anasarca, +dizziness, No-palpitations GI  No heartburn, indigestion, abdominal pain, nausea, vomiting, Resp: +Shortness of breath with exertion. , no recent coughing up of blood. .  Some minimal wheezing.  Skin: no  rash or lesions. GU:   MS:  + joint pain or swelling.   Psych:  No change in mood or affect. No depression + anxiety.  No memory loss.  Objective:   Physical Exam General- Alert, Oriented, Affect-appropriate, Distress- none acute; thin. Looks chronically ill, wheelchair, O2 2L / concentrator 3L Skin- rash-none, lesions- none, excoriation- none.  Lymphadenopathy- none Head- atraumatic            Eyes- Gross vision intact, PERRLA, conjunctivae clear secretions            Ears- Hearing, canals- normal            Nose- Clear, No-Septal dev, mucus, polyps, erosion, perforation             Throat- Mallampati II , mucosa clear , drainage- none, tonsils- atrophic Neck- flexible , trachea midline, no stridor , thyroid nl, carotid no bruit Chest - symmetrical excursion , unlabored           Heart/CV- + remains regular,rapid RRR ., no murmur , no gallop  , no rub, nl s1 s2                            JVD+  full , edema +trace, stasis changes- none, varices- none           Lung-  + bilateral  rhonchi, unlabored, cough- mild , dullness-none, rub- none           Chest wall-  Abd-  Br/ Gen/ Rectal- Not done, not indicated Extrem- cyanosis- none, clubbing, none, atrophy- none, strength- nl Neuro- grossly intact to observation

## 2013-06-25 NOTE — Patient Instructions (Signed)
[facility]  Do for 10 minutes twice daily, or as directed  Order- lab Cystic Fibrosis panel  Dx chronic bronchiectasis  If needed, increase your oxygen to 3 L/min when short of breath

## 2013-06-25 NOTE — Assessment & Plan Note (Signed)
She is very weak. I see no improvement

## 2013-06-25 NOTE — Assessment & Plan Note (Signed)
Gradually more dyspnea c/w weakness and progressive chronic respiratory failure. Dout this is a variant of CF, but it would matter to family so will check.  Flutter device is not adequate for pulmonary toilet.  Plan- pneumatic Vest. Discussed oxygen.

## 2013-06-30 LAB — CYSTIC FIBROSIS DIAGNOSTIC STUDY

## 2013-07-01 ENCOUNTER — Ambulatory Visit: Payer: Medicare Other | Admitting: Internal Medicine

## 2013-07-10 ENCOUNTER — Ambulatory Visit (INDEPENDENT_AMBULATORY_CARE_PROVIDER_SITE_OTHER): Payer: Medicare Other

## 2013-07-10 DIAGNOSIS — D518 Other vitamin B12 deficiency anemias: Secondary | ICD-10-CM

## 2013-07-10 DIAGNOSIS — D519 Vitamin B12 deficiency anemia, unspecified: Secondary | ICD-10-CM

## 2013-07-10 MED ORDER — CYANOCOBALAMIN 1000 MCG/ML IJ SOLN
1000.0000 ug | Freq: Once | INTRAMUSCULAR | Status: AC
  Administered 2013-07-10: 1000 ug via INTRAMUSCULAR

## 2013-08-04 IMAGING — CR DG CHEST 2V
2 series · 2 of 2 positions shown · non-contrast
Comparison: 12/17/2012 and 09/11/2012

CLINICAL DATA: Bronchiectasis.  Shortness of breath.  Nonsmoker.
COPD.

CHEST - 2 VIEW

[view not recorded (1 of 2)]
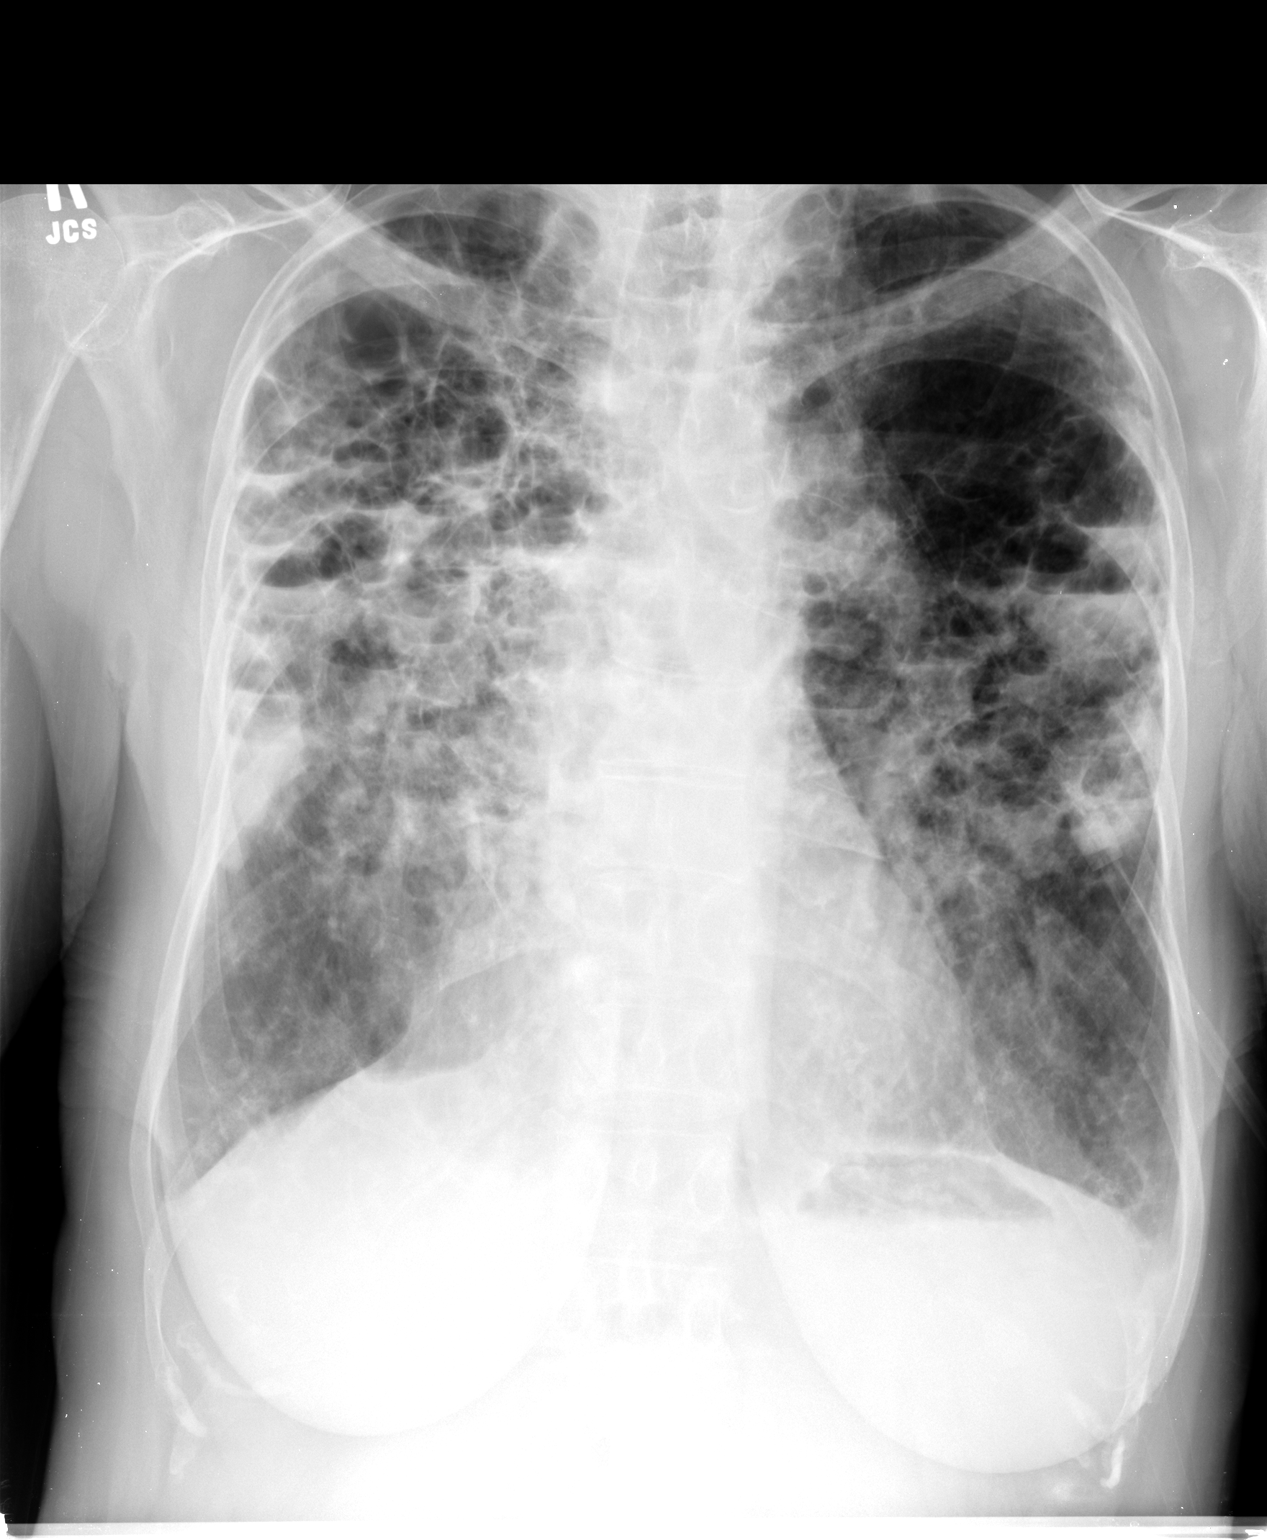

[view not recorded (2 of 2)]
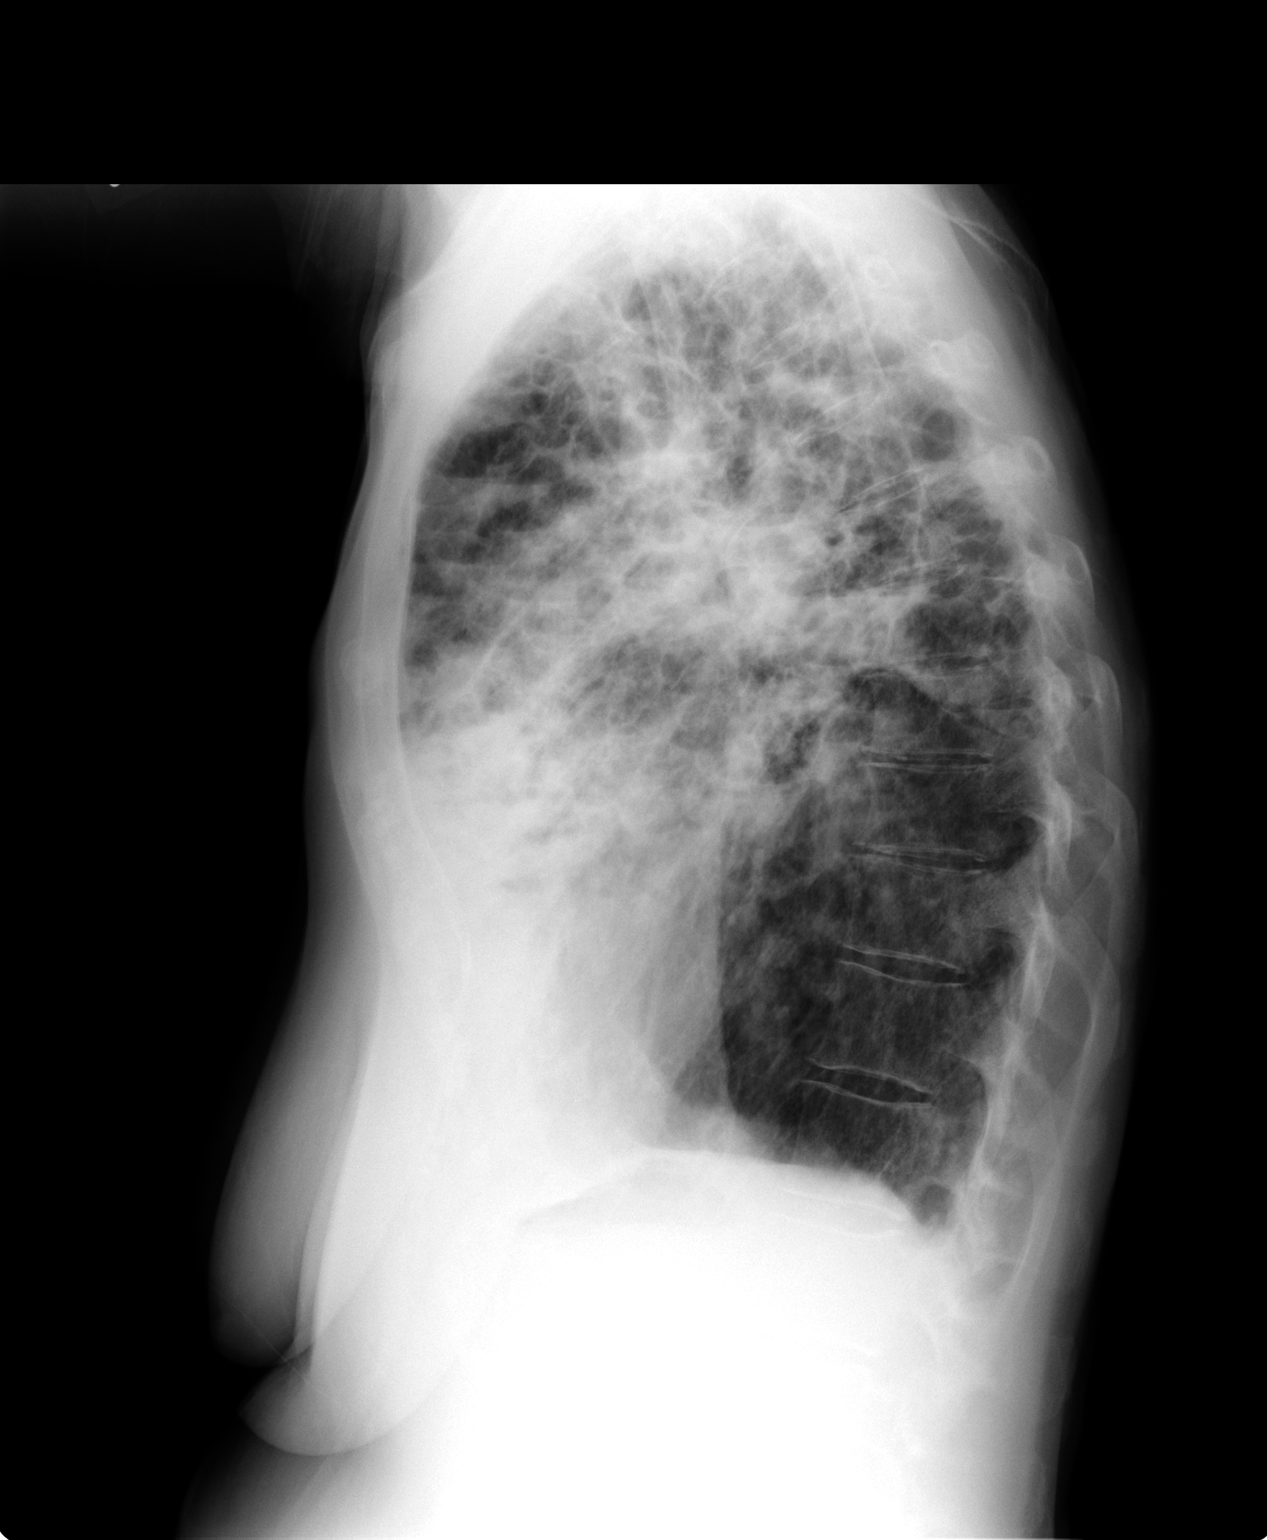

[2 of 2 positions shown; findings below may reference images not displayed]

FINDINGS: Midline trachea.  Mild cardiomegaly.  Limited evaluation
for mediastinal or hilar adenopathy secondary the extent of
pulmonary parenchymal opacification.  Right mediastinal and hilar
adenopathy are possible. No pleural effusion or pneumothorax.
Worsened upper lobe aeration with increased airspace disease and
cystic changes.  Multiple air-fluid levels bilaterally.  Relative
sparing of the lower lobes.  Mild pectus excavatum deformity.
IMPRESSION: Overall worsened aeration.  Favored to be secondary to cystic
bronchiectasis, likely post infectious.  Ongoing superimposed
infection is suspected, especially given multiple air-fluid levels.

Mild cardiomegaly, accentuated by pectus excavatum deformity.

## 2013-08-06 ENCOUNTER — Encounter: Payer: Self-pay | Admitting: Internal Medicine

## 2013-08-06 ENCOUNTER — Ambulatory Visit (INDEPENDENT_AMBULATORY_CARE_PROVIDER_SITE_OTHER): Payer: Medicare Other | Admitting: Internal Medicine

## 2013-08-06 VITALS — BP 142/70 | HR 107 | Temp 97.9°F | Wt 122.8 lb

## 2013-08-06 DIAGNOSIS — R079 Chest pain, unspecified: Secondary | ICD-10-CM

## 2013-08-06 DIAGNOSIS — J479 Bronchiectasis, uncomplicated: Secondary | ICD-10-CM

## 2013-08-06 DIAGNOSIS — R609 Edema, unspecified: Secondary | ICD-10-CM

## 2013-08-06 NOTE — Progress Notes (Signed)
Subjective:    Patient ID: Stacy Novak, female    DOB: Jan 19, 1941, 72 y.o.   MRN: [id number]  HPI Here for follow up - reviewed chronic medical issues today:  Bronchiectasis - history of MAC with frequent and recurrent exacerbations; chronic dyspnea on exertion - overlap with COPD and asthma reviewed - follows regularly with pulmonary for same - the patient reports compliance with medication(s) as prescribed. Denies adverse side effects.  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 Status post treatment by infectious disease - last OV 06/2013 reviewed - symptoms complicated by chronic pulmonary issues as noted above  Hypertension - the patient reports Variable compliance with medication prescribed. Denies adverse side effects.  GERD - previously on daily PPI therapy for over 10 years. Self discontinued Nexium may 2013 in the setting of new diagnosis B12 deficiency and pernicious anemia. Currently reports early satiety and bloating as greater discomfort than indigestion or reflux. Uses TUMS as needed. Has declined GI evaluation  Past Medical History  Diagnosis Date  . 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 COPD (chronic obstructive pulmonary disease)   . Bronchiectasis   . Hypercalcemia   . GERD (gastroesophageal reflux disease)   . Hypertension   . Asthma     hx of PNA  . Adult failure to thrive 08/06/2012    20 pound weight loss one year, anorexia, pernicious anemia   . 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 CXR 01/23/12- again shows essentially stable severe fibro--cystic scarring with bronchiectasis and air fluid levels, but no obvious progression. Desat to 86% with exertion walking room air- order portable O2   . GERD   . Pernicious anemia     Review of Systems  Constitutional: Positive for fatigue. Negative for fever.  Respiratory: Positive for chest tightness  and shortness of breath.   Cardiovascular: Negative for chest pain and leg swelling.        Objective:   Physical Exam BP 142/70  Pulse 107  Temp(Src) 97.9 F (36.6 C) (Oral)  Wt 122 lb 12.8 oz (55.702 kg)  SpO2 90% Wt Readings from Last 3 Encounters:  08/06/13 122 lb 12.8 oz (55.702 kg)  06/25/13 121 lb 12.8 oz (55.248 kg)  06/24/13 121 lb (54.885 kg)   Constitutional: She is thin, but appears well-developed and well-nourished. No distress. spouse at side Neck: Normal range of motion. Neck supple. No JVD or LAD present. No thyromegaly present.  Cardiovascular: Tachy rate, irregular rhythm and ?S3 heart sounds.  +syst murmur 3/6 at LUSB. No BLE edema, but soft tissue swelling at L>R ankle. Pulmonary/Chest: Effort slightly increased with conversation at rest, breath sounds diminished bilaterally with coarse crackles. No respiratory distress. She has no wheezes.  Neurological: She is alert and oriented to person, place, and time. No cranial nerve deficit. Coordination, speech, balance and recall are normal.  Skin: Skin is warm and dry. No rash noted. No erythema.  Psychiatric: She has a normal mood and affect. Her behavior is normal. Judgment and thought content fair.   Lab Results  Component Value Date   WBC 8.9 02/28/2013   HGB 13.5 02/28/2013   HCT 41.4 02/28/2013   PLT 289.0 02/28/2013   GLUCOSE 120* 02/28/2013   ALT 9 02/28/2013   AST 15 02/28/2013   NA 138 02/28/2013   K 3.7 02/28/2013   CL 102 02/28/2013   CREATININE 0.7 02/28/2013  BUN 15 02/28/2013   CO2 34* 02/28/2013   TSH 1.46 02/18/2013   INR 1.1* 01/19/2012   HGBA1C 6.3 02/18/2013   Lab Results  Component Value Date   VITAMINB12 154* 03/19/2012       Assessment & Plan:   New episodic chest pain and tightness - last week New mild dep edema  High risk for arrythmia and RHF with adv hypoxic lung dz  Check ECG (if able to get into position): sinus tach - no ischemic  changes Also see problem list. Medications and  labs reviewed today.

## 2013-08-06 NOTE — Assessment & Plan Note (Signed)
Chronic hypoxic respiratory failure related to same. History of MAI infection. Follows with pulmonary related to same but has declined medications - on oxygen therapy but only 2LPM (reviewed recommended for 3LPM last month). Daily dyspnea on exertion symptoms, now edema and chest pain check ECG and refer for new echo (last in 03/2012 reviewed) No medication changes recommended at this time History including last pulmonary and ID note reviewed in depth today.

## 2013-08-06 NOTE — Patient Instructions (Addendum)
It was good to see you today.  We have reviewed your prior records including labs and tests today  Medications reviewed and updated, no changes recommended at this time.  we'll make referral for echocardiogram to evaluate your heart pumping function and check for any heart failure . Our office will contact you regarding appointment(s) once made.  Please wear oxygen at 3 L per minute at all times as per Dr. Roxy Cedar recommendation  Please schedule followup in 6 months, call sooner if problems.   Edema Edema is an abnormal build-up of fluids in tissues. Because this is partly dependent on gravity (water flows to the lowest place), it is more common in the legs and thighs (lower extremities). It is also common in the looser tissues, like around the eyes. Painless swelling of the feet and ankles is common and increases as a person ages. It may affect both legs and may include the calves or even thighs. When squeezed, the fluid may move out of the affected area and may leave a dent for a few moments. CAUSES   Prolonged standing or sitting in one place for extended periods of time. Movement helps pump tissue fluid into the veins, and absence of movement prevents this, resulting in edema.  Varicose veins. The valves in the veins do not work as well as they should. This causes fluid to leak into the tissues.  Fluid and salt overload.  Injury, burn, or surgery to the leg, ankle, or foot, may damage veins and allow fluid to leak out.  Sunburn damages vessels. Leaky vessels allow fluid to go out into the sunburned tissues.  Allergies (from insect bites or stings, medications or chemicals) cause swelling by allowing vessels to become leaky.  Protein in the blood helps keep fluid in your vessels. Low protein, as in malnutrition, allows fluid to leak out.  Hormonal changes, including pregnancy and menstruation, cause fluid retention. This fluid may leak out of vessels and cause edema.  Medications  that cause fluid retention. Examples are sex hormones, blood pressure medications, steroid treatment, or anti-depressants.  Some illnesses cause edema, especially heart failure, kidney disease, or liver disease.  Surgery that cuts veins or lymph nodes, such as surgery done for the heart or for breast cancer, may result in edema. DIAGNOSIS  Your caregiver is usually easily able to determine what is causing your swelling (edema) by simply asking what is wrong (getting a history) and examining you (doing a physical). Sometimes x-rays, EKG (electrocardiogram or heart tracing), and blood work may be done to evaluate for underlying medical illness. TREATMENT  General treatment includes:  Leg elevation (or elevation of the affected body part).  Restriction of fluid intake.  Prevention of fluid overload.  Compression of the affected body part. Compression with elastic bandages or support stockings squeezes the tissues, preventing fluid from entering and forcing it back into the blood vessels.  Diuretics (also called water pills or fluid pills) pull fluid out of your body in the form of increased urination. These are effective in reducing the swelling, but can have side effects and must be used only under your caregiver's supervision. Diuretics are appropriate only for some types of edema. The specific treatment can be directed at any underlying causes discovered. Heart, liver, or kidney disease should be treated appropriately. HOME CARE INSTRUCTIONS   Elevate the legs (or affected body part) above the level of the heart, while lying down.  Avoid sitting or standing still for prolonged periods of time.  Avoid  putting anything directly under the knees when lying down, and do not wear constricting clothing or garters on the upper legs.  Exercising the legs causes the fluid to work back into the veins and lymphatic channels. This may help the swelling go down.  The pressure applied by elastic  bandages or support stockings can help reduce ankle swelling.  A low-salt diet may help reduce fluid retention and decrease the ankle swelling.  Take any medications exactly as prescribed. SEEK MEDICAL CARE IF:  Your edema is not responding to recommended treatments. SEEK IMMEDIATE MEDICAL CARE IF:   You develop shortness of breath or chest pain.  You cannot breathe when you lay down; or if, while lying down, you have to get up and go to the window to get your breath.  You are having increasing swelling without relief from treatment.  You develop a fever over 102 F (38.9 C).  You develop pain or redness in the areas that are swollen.  Tell your caregiver right away if you have gained 03 lb/1.4 kg in 1 day or 05 lb/2.3 kg in a week. MAKE SURE YOU:   Understand these instructions.  Will watch your condition.  Will get help right away if you are not doing well or get worse. Document Released: 08/07/2005 Document Revised: 02/06/2012 Document Reviewed: 03/25/2008 [facility] Patient Information 2014 [place], [place].

## 2013-08-06 NOTE — Progress Notes (Signed)
Pre-visit discussion using our clinic review tool. No additional management support is needed unless otherwise documented below in the visit note.  

## 2013-08-07 ENCOUNTER — Ambulatory Visit (INDEPENDENT_AMBULATORY_CARE_PROVIDER_SITE_OTHER): Payer: Medicare Other | Admitting: *Deleted

## 2013-08-07 DIAGNOSIS — D519 Vitamin B12 deficiency anemia, unspecified: Secondary | ICD-10-CM

## 2013-08-07 DIAGNOSIS — D518 Other vitamin B12 deficiency anemias: Secondary | ICD-10-CM

## 2013-08-07 MED ORDER — CYANOCOBALAMIN 1000 MCG/ML IJ SOLN
1000.0000 ug | Freq: Once | INTRAMUSCULAR | Status: AC
  Administered 2013-08-07: 1000 ug via INTRAMUSCULAR

## 2013-08-28 ENCOUNTER — Other Ambulatory Visit (HOSPITAL_COMMUNITY): Payer: Medicare Other

## 2013-09-04 ENCOUNTER — Ambulatory Visit (INDEPENDENT_AMBULATORY_CARE_PROVIDER_SITE_OTHER): Payer: Medicare Other | Admitting: *Deleted

## 2013-09-04 DIAGNOSIS — E538 Deficiency of other specified B group vitamins: Secondary | ICD-10-CM

## 2013-09-04 MED ORDER — CYANOCOBALAMIN 1000 MCG/ML IJ SOLN
1000.0000 ug | Freq: Once | INTRAMUSCULAR | Status: AC
  Administered 2013-09-04: 1000 ug via INTRAMUSCULAR

## 2013-09-10 ENCOUNTER — Ambulatory Visit (HOSPITAL_COMMUNITY): Payer: Medicare Other | Attending: Cardiology | Admitting: Cardiology

## 2013-09-10 ENCOUNTER — Encounter: Payer: Self-pay | Admitting: Cardiology

## 2013-09-10 DIAGNOSIS — I079 Rheumatic tricuspid valve disease, unspecified: Secondary | ICD-10-CM | POA: Insufficient documentation

## 2013-09-10 DIAGNOSIS — R079 Chest pain, unspecified: Secondary | ICD-10-CM | POA: Insufficient documentation

## 2013-09-10 DIAGNOSIS — R5383 Other fatigue: Secondary | ICD-10-CM

## 2013-09-10 DIAGNOSIS — R609 Edema, unspecified: Secondary | ICD-10-CM

## 2013-09-10 DIAGNOSIS — I059 Rheumatic mitral valve disease, unspecified: Secondary | ICD-10-CM | POA: Insufficient documentation

## 2013-09-10 DIAGNOSIS — R0989 Other specified symptoms and signs involving the circulatory and respiratory systems: Secondary | ICD-10-CM | POA: Insufficient documentation

## 2013-09-10 DIAGNOSIS — J479 Bronchiectasis, uncomplicated: Secondary | ICD-10-CM

## 2013-09-10 DIAGNOSIS — R5381 Other malaise: Secondary | ICD-10-CM | POA: Insufficient documentation

## 2013-09-10 DIAGNOSIS — R0609 Other forms of dyspnea: Secondary | ICD-10-CM | POA: Insufficient documentation

## 2013-09-10 DIAGNOSIS — I10 Essential (primary) hypertension: Secondary | ICD-10-CM | POA: Insufficient documentation

## 2013-09-10 DIAGNOSIS — J449 Chronic obstructive pulmonary disease, unspecified: Secondary | ICD-10-CM | POA: Insufficient documentation

## 2013-09-10 DIAGNOSIS — J4489 Other specified chronic obstructive pulmonary disease: Secondary | ICD-10-CM | POA: Insufficient documentation

## 2013-09-10 NOTE — Progress Notes (Signed)
Echo performed. 

## 2013-10-02 ENCOUNTER — Ambulatory Visit: Payer: Medicare Other

## 2013-10-09 ENCOUNTER — Ambulatory Visit: Payer: Medicare Other | Admitting: Internal Medicine

## 2013-10-14 ENCOUNTER — Ambulatory Visit: Payer: Medicare Other | Admitting: Internal Medicine

## 2013-10-17 ENCOUNTER — Ambulatory Visit (INDEPENDENT_AMBULATORY_CARE_PROVIDER_SITE_OTHER): Payer: Medicare Other | Admitting: Internal Medicine

## 2013-10-17 ENCOUNTER — Other Ambulatory Visit (INDEPENDENT_AMBULATORY_CARE_PROVIDER_SITE_OTHER): Payer: Medicare Other

## 2013-10-17 ENCOUNTER — Telehealth: Payer: Self-pay | Admitting: Internal Medicine

## 2013-10-17 ENCOUNTER — Encounter: Payer: Self-pay | Admitting: Internal Medicine

## 2013-10-17 VITALS — BP 130/62 | HR 106 | Wt 119.4 lb

## 2013-10-17 DIAGNOSIS — J479 Bronchiectasis, uncomplicated: Secondary | ICD-10-CM

## 2013-10-17 DIAGNOSIS — R5383 Other fatigue: Secondary | ICD-10-CM

## 2013-10-17 DIAGNOSIS — R5381 Other malaise: Secondary | ICD-10-CM

## 2013-10-17 DIAGNOSIS — Z136 Encounter for screening for cardiovascular disorders: Secondary | ICD-10-CM

## 2013-10-17 DIAGNOSIS — R627 Adult failure to thrive: Secondary | ICD-10-CM

## 2013-10-17 LAB — BASIC METABOLIC PANEL
BUN: 15 mg/dL (ref 6–23)
CHLORIDE: 96 meq/L (ref 96–112)
CO2: 40 mEq/L — ABNORMAL HIGH (ref 19–32)
Calcium: 9 mg/dL (ref 8.4–10.5)
Creatinine, Ser: 0.4 mg/dL (ref 0.4–1.2)
GFR: 165.55 mL/min (ref >60.00)
Glucose, Bld: 91 mg/dL (ref 70–99)
Potassium: 4.6 mEq/L (ref 3.5–5.1)
Sodium: 137 mEq/L (ref 135–145)

## 2013-10-17 LAB — HEPATIC FUNCTION PANEL
ALT: 10 U/L (ref 0–35)
AST: 16 U/L (ref 0–37)
Albumin: 2.8 g/dL — ABNORMAL LOW (ref 3.5–5.2)
Alkaline Phosphatase: 77 U/L (ref 39–117)
BILIRUBIN DIRECT: 0.1 mg/dL (ref 0.0–0.3)
BILIRUBIN TOTAL: 0.6 mg/dL (ref 0.3–1.2)
Total Protein: 7.7 g/dL (ref 6.0–8.3)

## 2013-10-17 LAB — LIPID PANEL
Cholesterol: 124 mg/dL (ref 0–200)
HDL: 54.8 mg/dL (ref >39.00)
LDL CALC: 58 mg/dL (ref 0–99)
Total CHOL/HDL Ratio: 2
Triglycerides: 55 mg/dL (ref 0.0–149.0)
VLDL: 11 mg/dL (ref 0.0–40.0)

## 2013-10-17 LAB — CBC WITH DIFFERENTIAL/PLATELET
Basophils Absolute: 0 10*3/uL (ref 0.0–0.1)
Basophils Relative: 0.4 % (ref 0.0–3.0)
EOS ABS: 0.2 10*3/uL (ref 0.0–0.7)
EOS PCT: 2.3 % (ref 0.0–5.0)
HCT: 41.8 % (ref 36.0–46.0)
Hemoglobin: 13 g/dL (ref 12.0–15.0)
Lymphocytes Relative: 18.7 % (ref 12.0–46.0)
Lymphs Abs: 1.5 10*3/uL (ref 0.7–4.0)
MCHC: 31.1 g/dL (ref 30.0–36.0)
MCV: 86.6 fl (ref 78.0–100.0)
MONO ABS: 0.6 10*3/uL (ref 0.1–1.0)
Monocytes Relative: 6.9 % (ref 3.0–12.0)
Neutro Abs: 5.9 10*3/uL (ref 1.4–7.7)
Neutrophils Relative %: 71.7 % (ref 43.0–77.0)
PLATELETS: 283 10*3/uL (ref 150.0–400.0)
RBC: 4.82 Mil/uL (ref 3.87–5.11)
RDW: 13.4 % (ref 11.5–14.6)
WBC: 8.3 10*3/uL (ref 4.5–10.5)

## 2013-10-17 LAB — TSH: TSH: 1.29 u[IU]/mL (ref 0.35–5.50)

## 2013-10-17 NOTE — Progress Notes (Signed)
Subjective:    Patient ID: Stacy Novak, female    DOB: Oct 14, 1940, 73 y.o.   MRN: [id number]  Back Pain This is a new problem. The current episode started 1 to 4 weeks ago. The pain is present in the thoracic spine. The quality of the pain is described as aching. The pain does not radiate. The patient is experiencing no pain. The symptoms are aggravated by position and sitting. Stiffness is present all day. Pertinent negatives include no abdominal pain, bladder incontinence, chest pain, fever, headaches, leg pain, paresthesias, pelvic pain, perianal numbness, weakness or weight loss. Risk factors include menopause, lack of exercise, poor posture, sedentary lifestyle and history of steroid use (no cancer, no trauma). She has tried heat and bed rest for the symptoms. The treatment provided moderate relief.    also reviewed chronic medical issues and interval events today:  Bronchiectasis - history of MAC with frequent and recurrent exacerbations; chronic dyspnea on exertion - overlap with COPD and asthma reviewed - follows regularly with pulmonary for same - the patient reports compliance with medication(s) as prescribed. Denies adverse side effects.  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 Status post treatment by infectious disease - last OV 06/2013 reviewed - symptoms complicated by chronic pulmonary issues as noted above  Hypertension - the patient reports Variable compliance with medication prescribed. Denies adverse side effects.  GERD - previously on daily PPI therapy for over 10 years. Self discontinued Nexium may 2013 in the setting of new diagnosis B12 deficiency and pernicious anemia. Currently reports early satiety and bloating as greater discomfort than indigestion or reflux. Uses TUMS as needed. Has declined GI evaluation  Past Medical History  Diagnosis Date  . 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 COPD (chronic obstructive  pulmonary disease)   . Bronchiectasis   . Hypercalcemia   . GERD (gastroesophageal reflux disease)   . Hypertension   . Asthma     hx of PNA  . Adult failure to thrive 08/06/2012    20 pound weight loss one year, anorexia, pernicious anemia   . 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 CXR 01/23/12- again shows essentially stable severe fibro--cystic scarring with bronchiectasis and air fluid levels, but no obvious progression. Desat to 86% with exertion walking room air- order portable O2   . GERD   . Pernicious anemia     Review of Systems  Constitutional: Positive for fatigue. Negative for fever and weight loss.  Respiratory: Positive for chest tightness (chronic) and shortness of breath (chronic, worse with exertion).   Cardiovascular: Negative for chest pain and leg swelling.  Gastrointestinal: Negative for abdominal pain.  Genitourinary: Negative for bladder incontinence and pelvic pain.  Musculoskeletal: Positive for back pain.  Neurological: Negative for weakness, headaches and paresthesias.        Objective:   Physical Exam BP 130/62  Pulse 106  Wt 119 lb 6.4 oz (54.159 kg)  SpO2 91% Wt Readings from Last 3 Encounters:  10/17/13 119 lb 6.4 oz (54.159 kg)  08/06/13 122 lb 12.8 oz (55.702 kg)  06/25/13 121 lb 12.8 oz (55.248 kg)   Constitutional: She is thin, but appears well-developed and well-nourished. No distress. spouse at side Neck: Normal range of motion. Neck supple. No JVD or LAD present. No thyromegaly present.  Cardiovascular: Tachy rate, irregular rhythm and ?S3 heart sounds.  +syst murmur 3/6 at LUSB.  No BLE edema, but chronic soft tissue swelling at L>R ankle. Pulmonary/Chest: Effort slightly increased with conversation at rest, breath sounds diminished bilaterally with coarse crackles. No respiratory distress. She has no wheezes.  MSkel: Back: full range of motion of thoracic and lumbar  spine. Non tender to palpation. Negative straight leg raise. DTR's are symmetrically intact. Sensation intact in all dermatomes of the lower extremities. Full strength to manual muscle testing. patient is able to heel toe walk without difficulty and ambulates with antalgic gait. Neurological: She is alert and oriented to person, place, and time. No cranial nerve deficit. Coordination, speech, balance and recall are normal.  Skin: Skin is warm and dry. No rash noted. No erythema.  Psychiatric: She has a normal mood and affect. Her behavior is normal. Judgment and thought content fair.   Lab Results  Component Value Date   WBC 8.9 02/28/2013   HGB 13.5 02/28/2013   HCT 41.4 02/28/2013   PLT 289.0 02/28/2013   GLUCOSE 120* 02/28/2013   ALT 9 02/28/2013   AST 15 02/28/2013   NA 138 02/28/2013   K 3.7 02/28/2013   CL 102 02/28/2013   CREATININE 0.7 02/28/2013   BUN 15 02/28/2013   CO2 34* 02/28/2013   TSH 1.46 02/18/2013   INR 1.1* 01/19/2012   HGBA1C 6.3 02/18/2013   Lab Results  Component Value Date   VITAMINB12 154* 03/19/2012       Assessment & Plan:   Dyspnea on exertion, chronic. Suspect related pulmonary disease. Reviewed 2-D echo January 2015 in depth, no pulmonary hypertension, RV or LV hypertrophy. No bowel disease or other cardiac anatomic concerns. Continue pulmonary treatment and followup with pulmonology as planned for advanced lung disease  Fatigue - nonspecific symptoms/exam, suspect multifactorial with adv lung dz - check screening labs - no med changes recommended - component of adult failure to reviewed, patient and family declined need for home health social work or other intervention at this time, but recognize need for potential assisted-living or other home care assistance  Nonspecific thoracic back pain. Not constant, not exertional, not reproducible. Suspect musculoskeletal strain from chronic increased work of breathing effort. Suggested symptomatic care with heat or ice as  needed. Patient declines need for muscle relaxer - patient agrees to call symptoms worse or unimproved  Problem List Items Addressed This Visit   Adult failure to thrive     Chronic and progressive weakness, suspect multifactorial    Bronchiectasis without acute exacerbation     Chronic hypoxic respiratory failure related to same. History of MAI infection. Follows with pulmonary related to same but has declined medications - on oxygen therapy but only 2LPM (reviewed recommended for 3LPM last month). Daily dyspnea on exertion symptoms, reviewed last echo (normal 08/2013) No medication changes recommended at this time History including last pulmonary and ID note reviewed in depth today.     Other Visit Diagnoses   Fatigue    -  Primary    Relevant Orders       Basic metabolic panel       CBC with Differential       Hepatic function panel       TSH    Screening for ischemic heart disease        Relevant Orders       Lipid panel

## 2013-10-17 NOTE — Telephone Encounter (Signed)
Spoke with the pt  She states that Florentina AddisonKatie called her at 9:30 am today  She is not sure what this was regarding and I see no documentation  Please advise

## 2013-10-17 NOTE — Telephone Encounter (Signed)
Pt was told to call and have her appt on 10-23-13 RSC'd as CY is out of the office on 10-23-13 pm. I spoke with patient and we RSC'd her appointment.

## 2013-10-17 NOTE — Assessment & Plan Note (Signed)
Chronic hypoxic respiratory failure related to same. History of MAI infection. Follows with pulmonary related to same but has declined medications - on oxygen therapy but only 2LPM (reviewed recommended for 3LPM last month). Daily dyspnea on exertion symptoms, reviewed last echo (normal 08/2013) No medication changes recommended at this time History including last pulmonary and ID note reviewed in depth today.

## 2013-10-17 NOTE — Assessment & Plan Note (Signed)
Chronic and progressive weakness, suspect multifactorial

## 2013-10-17 NOTE — Progress Notes (Signed)
Stacy Novak [id number] 10/17/2013   Chief Complaint  Patient presents with  . Follow-up    Subjective  HPI Here today to review the results of recent 2D Echo and discuss new back discomfort.  Also reviewed chronic medical conditions and interval events.  Non-Specific Back Discomfort -  2 mo history  , non-painful, non-radiating, diffuse, intermittent feeling of "heavy weight on back."  Located in b/l thoracic region excluding the spine.  No aggravating or relieving factors.  Nothing seems to precede events.  Possibly follows long bouts of sitting upright and/or leaning forward.  No trauma, CP or abdominal pain.  She has not taken any medications or tried anything else to resolve symptoms.  2D Echo - Reviewed results of study   Past Medical History  Diagnosis Date  . 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 COPD (chronic obstructive pulmonary disease)   . Bronchiectasis   . Hypercalcemia   . GERD (gastroesophageal reflux disease)   . Hypertension   . Asthma     hx of PNA  . Adult failure to thrive 08/06/2012    20 pound weight loss one year, anorexia, pernicious anemia   . 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 CXR 01/23/12- again shows essentially stable severe fibro--cystic scarring with bronchiectasis and air fluid levels, but no obvious progression. Desat to 86% with exertion walking room air- order portable O2   . GERD   . Pernicious anemia     Past Surgical History  Procedure Laterality Date  . Tonsillectomy    . Lung biopsy      Family History  Problem Relation Age of Onset  . Hypertension    . Stroke Father   . Alzheimer's disease Mother   . Atrial fibrillation Mother   . Seizures Son     History  Substance Use Topics  . Smoking status: Never Smoker   . Smokeless tobacco: Never Used  . Alcohol Use: No    Current Outpatient Prescriptions on File Prior to  Visit  Medication Sig Dispense Refill  . cyanocobalamin (,VITAMIN B-12,) 1000 MCG/ML injection Inject 1 mL (1,000 mcg total) into the muscle every 30 (thirty) days.  1 mL  0   No current facility-administered medications on file prior to visit.    Allergies:  Allergies  Allergen Reactions  . Daliresp [Roflumilast]     "messed with my head"  . Sulfonamide Derivatives     REACTION: nausea    Review of Systems  Constitutional: Negative for fever and chills.  Respiratory: Positive for cough and shortness of breath.        SOB is a chronic complaint   Cardiovascular: Negative for chest pain, palpitations and leg swelling.  Musculoskeletal:       Heavy feeling in mid-back.  See HPI for details   Skin: Positive for itching (on back where she can not reach with lotion.  Resolved when lotion is applied). Negative for rash.       Objective  Filed Vitals:   10/17/13 1311  BP: 130/62  Pulse: 106  Weight: 119 lb 6.4 oz (54.159 kg)  SpO2: 91%    Physical Exam  Constitutional:  Thin, frail looking, forward leaning posture, in NAD   HENT:  Head: Normocephalic.  Cardiovascular:  Tachy rate.  Regular rhythm, no m/g/r heard   Respiratory:  Increased breathing effort.  On oxygen via nasal canula. B/L rales A/P.  Musculoskeletal:  Moderately restricted ROM in lower back and torso in all directions.  Non-tender to palpation.  Symptoms not reproducible. Erector muscles tight with mild spasm in the thoracic region b/l.     BP Readings from Last 3 Encounters:  10/17/13 130/62  08/06/13 142/70  06/25/13 120/68    Wt Readings from Last 3 Encounters:  10/17/13 119 lb 6.4 oz (54.159 kg)  08/06/13 122 lb 12.8 oz (55.702 kg)  06/25/13 121 lb 12.8 oz (55.248 kg)    Lab Results  Component Value Date   WBC 8.9 02/28/2013   HGB 13.5 02/28/2013   HCT 41.4 02/28/2013   PLT 289.0 02/28/2013   GLUCOSE 120* 02/28/2013   ALT 9 02/28/2013   AST 15 02/28/2013   NA 138 02/28/2013   K 3.7  02/28/2013   CL 102 02/28/2013   CREATININE 0.7 02/28/2013   BUN 15 02/28/2013   CO2 34* 02/28/2013   TSH 1.46 02/18/2013   INR 1.1* 01/19/2012   HGBA1C 6.3 02/18/2013    Dg Chest 2 View  02/28/2013   *RADIOLOGY REPORT*  Clinical Data: Bronchiectasis.  Shortness of breath.  Nonsmoker. COPD.  CHEST - 2 VIEW  Comparison: 12/17/2012 and 09/11/2012  Findings: Midline trachea.  Mild cardiomegaly.  Limited evaluation for mediastinal or hilar adenopathy secondary the extent of pulmonary parenchymal opacification.  Right mediastinal and hilar adenopathy are possible. No pleural effusion or pneumothorax. Worsened upper lobe aeration with increased airspace disease and cystic changes.  Multiple air-fluid levels bilaterally.  Relative sparing of the lower lobes.  Mild pectus excavatum deformity.  IMPRESSION: Overall worsened aeration.  Favored to be secondary to cystic bronchiectasis, likely post infectious.  Ongoing superimposed infection is suspected, especially given multiple air-fluid levels.  Mild cardiomegaly, accentuated by pectus excavatum deformity.   Original Report Authenticated By: Jeronimo GreavesKyle Talbot, M.D.   09/10/13 2D Echo - Results reviewed.  WNL.  EF 55-60%.   Assessment and Plan  Non-Specific Back Discomfort - Sxs and exam consistent with muscle tightness/spasm and/or strain secondary to posture and increased effort of breathing.  Discussed low dose muscle relaxer to be used at night.  Patient declines same stating symptoms not significant to medically treat.  XR of spine discussed to r/o spinal involvement.  Patient declines this as well.  2D Echo - Results reviewed.  WNL.  EF 55-60%.  No additional action/testing warranted at this time.  Patient was instructed to notify the office if any new symptoms emerged or the current symptoms become worse.   Return in about 6 months (around 04/16/2014) for semiannual visit and labs.  Evie LacksMartensen, Hector Taft C, Student-PA    I have personally reviewed this case with  PA student. I also personally examined this patient. I agree with history and findings as documented above. I reviewed, discussed and approve of the assessment and plan as listed above. Rene PaciValerie Leschber, MD

## 2013-10-17 NOTE — Patient Instructions (Addendum)
It was good to see you today.  We have reviewed your prior records including labs and tests today  Test(s) ordered today. Your results will be released to MyChart (or called to you) after review, usually within 72hours after test completion. If any changes need to be made, you will be notified at that same time.  Medications reviewed and updated, no changes recommended at this time.  Please schedule followup in 6 months, call sooner if problems.  Fatigue Fatigue is a feeling of tiredness, lack of energy, lack of motivation, or feeling tired all the time. Having enough rest, good nutrition, and reducing stress will normally reduce fatigue. Consult your caregiver if it persists. The nature of your fatigue will help your caregiver to find out its cause. The treatment is based on the cause.  CAUSES  There are many causes for fatigue. Most of the time, fatigue can be traced to one or more of your habits or routines. Most causes fit into one or more of three general areas. They are: Lifestyle problems  Sleep disturbances.  Overwork.  Physical exertion.  Unhealthy habits.  Poor eating habits or eating disorders.  Alcohol and/or drug use .  Lack of proper nutrition (malnutrition). Psychological problems  Stress and/or anxiety problems.  Depression.  Grief.  Boredom. Medical Problems or Conditions  Anemia.  Pregnancy.  Thyroid gland problems.  Recovery from major surgery.  Continuous pain.  Emphysema or asthma that is not well controlled  Allergic conditions.  Diabetes.  Infections (such as mononucleosis).  Obesity.  Sleep disorders, such as sleep apnea.  Heart failure or other heart-related problems.  Cancer.  Kidney disease.  Liver disease.  Effects of certain medicines such as antihistamines, cough and cold remedies, prescription pain medicines, heart and blood pressure medicines, drugs used for treatment of cancer, and some antidepressants. SYMPTOMS   The symptoms of fatigue include:   Lack of energy.  Lack of drive (motivation).  Drowsiness.  Feeling of indifference to the surroundings. DIAGNOSIS  The details of how you feel help guide your caregiver in finding out what is causing the fatigue. You will be asked about your present and past health condition. It is important to review all medicines that you take, including prescription and non-prescription items. A thorough exam will be done. You will be questioned about your feelings, habits, and normal lifestyle. Your caregiver may suggest blood tests, urine tests, or other tests to look for common medical causes of fatigue.  TREATMENT  Fatigue is treated by correcting the underlying cause. For example, if you have continuous pain or depression, treating these causes will improve how you feel. Similarly, adjusting the dose of certain medicines will help in reducing fatigue.  HOME CARE INSTRUCTIONS   Try to get the required amount of good sleep every night.  Eat a healthy and nutritious diet, and drink enough water throughout the day.  Practice ways of relaxing (including yoga or meditation).  Exercise regularly.  Make plans to change situations that cause stress. Act on those plans so that stresses decrease over time. Keep your work and personal routine reasonable.  Avoid street drugs and minimize use of alcohol.  Start taking a daily multivitamin after consulting your caregiver. SEEK MEDICAL CARE IF:   You have persistent tiredness, which cannot be accounted for.  You have fever.  You have unintentional weight loss.  You have headaches.  You have disturbed sleep throughout the night.  You are feeling sad.  You have constipation.  You have  dry skin.  You have gained weight.  You are taking any new or different medicines that you suspect are causing fatigue.  You are unable to sleep at night.  You develop any unusual swelling of your legs or other parts of your  body. SEEK IMMEDIATE MEDICAL CARE IF:   You are feeling confused.  Your vision is blurred.  You feel faint or pass out.  You develop severe headache.  You develop severe abdominal, pelvic, or back pain.  You develop chest pain, shortness of breath, or an irregular or fast heartbeat.  You are unable to pass a normal amount of urine.  You develop abnormal bleeding such as bleeding from the rectum or you vomit blood.  You have thoughts about harming yourself or committing suicide.  You are worried that you might harm someone else. MAKE SURE YOU:   Understand these instructions.  Will watch your condition.  Will get help right away if you are not doing well or get worse. Document Released: 06/04/2007 Document Revised: 10/30/2011 Document Reviewed: 06/04/2007 [facility] Patient Information 2014 [place], [place].

## 2013-10-17 NOTE — Progress Notes (Signed)
Pre-visit discussion using our clinic review tool. No additional management support is needed unless otherwise documented below in the visit note.  

## 2013-10-23 ENCOUNTER — Ambulatory Visit: Payer: Medicare Other | Admitting: Internal Medicine

## 2013-10-30 ENCOUNTER — Ambulatory Visit (INDEPENDENT_AMBULATORY_CARE_PROVIDER_SITE_OTHER): Payer: Medicare Other | Admitting: *Deleted

## 2013-10-30 DIAGNOSIS — E538 Deficiency of other specified B group vitamins: Secondary | ICD-10-CM

## 2013-10-30 MED ORDER — CYANOCOBALAMIN 1000 MCG/ML IJ SOLN
1000.0000 ug | Freq: Once | INTRAMUSCULAR | Status: AC
  Administered 2013-10-30: 1000 ug via INTRAMUSCULAR

## 2013-11-18 ENCOUNTER — Ambulatory Visit (INDEPENDENT_AMBULATORY_CARE_PROVIDER_SITE_OTHER): Payer: Medicare Other | Admitting: Internal Medicine

## 2013-11-18 ENCOUNTER — Telehealth: Payer: Self-pay | Admitting: Internal Medicine

## 2013-11-18 ENCOUNTER — Encounter: Payer: Self-pay | Admitting: Internal Medicine

## 2013-11-18 VITALS — BP 124/70 | HR 111 | Ht 65.5 in

## 2013-11-18 DIAGNOSIS — R627 Adult failure to thrive: Secondary | ICD-10-CM

## 2013-11-18 DIAGNOSIS — J479 Bronchiectasis, uncomplicated: Secondary | ICD-10-CM

## 2013-11-18 DIAGNOSIS — A310 Pulmonary mycobacterial infection: Secondary | ICD-10-CM

## 2013-11-18 DIAGNOSIS — J9610 Chronic respiratory failure, unspecified whether with hypoxia or hypercapnia: Secondary | ICD-10-CM

## 2013-11-18 MED ORDER — METHYLPREDNISOLONE ACETATE 80 MG/ML IJ SUSP
80.0000 mg | Freq: Once | INTRAMUSCULAR | Status: AC
  Administered 2013-11-18: 80 mg via INTRAMUSCULAR

## 2013-11-18 MED ORDER — AMOXICILLIN-POT CLAVULANATE 875-125 MG PO TABS: 1.0000 | ORAL_TABLET | Freq: Two times a day (BID) | ORAL | Status: AC

## 2013-11-18 NOTE — Telephone Encounter (Signed)
Will forward to CDY as FYI  

## 2013-11-18 NOTE — Progress Notes (Signed)
Patient ID: Stacy Novak, female    DOB: 02/21/1941, 73 y.o.   MRN: [id number]  HPI 11/30/10-73 yo F never smoker, followed for bronchiectasis/ COPD, with hx treated atypical AFB/ MAIC, occasional hemoptysis and GERD.  Last here July 29, 2010 after normal flora on sputum cultures in October. Last antibiotic was augmentin in December. She questions if she should have had longer than 1 week treatment. Daily productive cough- yellow to light green. Shortness of breath with exertion across parking lot, up steps. . She does use Flutter occasionally. . No recent blood, fever, sweats or chest pain. Trial of Daliresp caused malaise.   03/01/11-73 yo F never smoker, followed for bronchiectasis/ COPD, with hx treated atypical AFB/ MAIC, occasional hemoptysis and GERD. No acute issues "just can't breathe and walk". She has avoided the weather extremes recently by staying in.  Cough always productive, greenish. Denies fever, sweat.  CXR- 11/30/10- reviewed with her- no definite new areas, but significant cystic bronchiectasis diffusely with some air fluid levels. PFT- 12/21/10- FEV1 1.02/ 49%; FEV1/FVC 0.54; no response to BD; TLC 0.77; DLCO 0.58    Severe obstructive disease, mild restriction.  6MWT She asks to defer formal oxygen assessment till she [facility] [place] [facility]. She has less cough in last few days, doesn't feel she could leave sputum specimen, but would like script for brand-name levaquin, which works better.   05/05/11- 73 yo F never smoker, followed for bronchiectasis/ COPD, with hx treated atypical AFB/ MAIC, occasional hemoptysis and GERD. We gave (she requested brand-name) levaquin to hold last visit. She felt she probably needed it because of pleuritic right chest pain that lasted 2-3 days, self limited. She isn't sure there was any change in her daily productive cough- usually white. Maybe some fever once. She waited because she had upset GI with a  little nausea and diarrhea. Still holding the levaquin.   07/06/11-  89 yo F never smoker, followed for bronchiectasis/ COPD, with hx treated atypical AFB/ MAIC, occasional hemoptysis and GERD. Has had flu vaccine. Has not taken the Levaquin prescription we gave previously. She was taking maintenance Zithromax to suppress bronchitis. She stayed off it for 2 weeks because of GI upset with cramps, after successfully taking it for 6 weeks. She has no GI discomfort now. She is going to try resuming maintenance Zithromax, but adding a probiotic. She did not tolerate Daliresp.  11/07/11-  89 yo F never smoker, followed for bronchiectasis/ COPD, with hx treated atypical AFB/ MAIC, occasional hemoptysis and GERD. Still coughing a lot, often productive, with looser mucus. Had tried maintenance Zithromax until she got GI upset. Remains on chronic Nexium for GERD. Discussed her recent reports of cardiac conduction problems with macrolide antibiotics. (REC- next visit- look at HR, chemistry)  01/19/12- 73 yo F never smoker, followed for bronchiectasis/ COPD, with hx treated atypical AFB/ MAIC, occasional hemoptysis and GERD. Coughing up blood-bright red; would happen off and on, but only on May 30 with no repeat today , . SOB and wheezing has increased. Always has some cough which has not changed. No easy bruising or other bleeding. No fever. Previous hemoptysis 2 or 3 years ago at which time we stopped the baby aspirin.  03/19/12-  73 yo F never smoker, followed for bronchiectasis/ COPD, with hx treated atypical AFB/ MAIC, occasional hemoptysis and GERD. Having increased SOB past 3 weeks; losing weight and unsure why; no energy.. Has lost 10 lbs since last visit.  140>130lbs. COPD assessment test (CAT) score 32/40. She complains of weakness, aching joints, weight loss. 2 episodes where she passed mucus per rectum-not blood. Dry eyes, nose and mouth times one month. Bloated with eating-takes her breath. Burning  she-asks B12 shot. Jittery. Has slept up in a chair for years because of chronic midthoracic back pain has not gone back to see Dr. [name] in a long time. She asks if all of this is a side effect of Nexium. We reviewed her radiology again. CXR 01/23/12-  IMPRESSION:  Stable chronic changes with scattered air-fluid levels, as  described above.  Original Report Authenticated By: [name], M.D.   04/02/12- 2 yo F never smoker, followed for  Cystic bronchiectasis/ COPD, with hx treated atypical AFB/ MAIC, occasional hemoptysis and GERD. Post [facility]-8[facility]-03[facility]27[facility]12 for dx bronchiectasis w/ acute exacerbation, COPD, anemia/ B12 deficiency, Failure to thrive.Treated vanc/ zithromax/ ceftazidime. Cultures all no growth. Severe diffuse cystic bronchiectasis. Had ID consult. ECHO 8/2- normal EF60%, unable to visualize for PA pressures. Treated B12 deficiency x 5 days, then outpat injection.  She has not re-established with [name]/ PCP and says there is not a current record there. Air conditioning broken, so she has been staying at [facility] place. Eating better, not gaining weight. Weak. Thick white mucus in mouth. No fever, chills, blood, pain, nodes.   04/17/12- [name]/ INF.DIS. DISEASE, PULMONARY D/T MYCOBACTERIA - [name], MD 04/17/2012 12:24 PM Signed  She is not doing well based on her weight loss and probable recent exacerbation. I reviewed her CXR with her, there is not a huge difference vs her previous studies from 11-2010. She is offered to restart her MAI therapy but she wants "to get her stomach straightened out". She has quit taking her prilosec/nexium and is relying on tums now.will get a repeat sputum AFB on her (she is given a specimen container and will return after she is able to provide a good specimen).  Needs improved nutrition and probably home O2 (i encouraged her multiple times to call [name] about this). We may need GI help with her stomach issues.  Will see her  back on October 3.   04/30/12-  29 yo F never smoker, followed for severe Cystic bronchiectasis/ COPD, with hx treated atypical AFB/ MAIC, occasional hemoptysis and GERD. Unable to tell any difference with using O2 QHS-does not use O2 during the day. Increased SOB-not getting any better; Unable to drink much water. Minor epistaxis blamed on dryness from her oxygen. Son thinks/blood-tinged sputum occasionally, otherwise yellowish or greenish. She had stopped Nexium, blaming it for her poor appetite-it seemed to cause bloating. Instead she now uses TUMS and we discussed his meds. Complains of burning in her feet attributed by her to B12 deficiency. I again asked her to get back with her primary physician.  05/14/12- 41 yo F never smoker, followed for severe Cystic bronchiectasis/ COPD, with hx treated atypical AFB/ MAIC, occasional hemoptysis and GERD  Husband here Sputum cx 03/22/12- Nl flora. ACUTE VISIT: increased cough-yellow in color and Increased SOB-getting worse. Cough is chronically productive. In the last few days has felt more short of breath, nonspecific. Had flu vaccine. Using saline nasal spray for nasal dryness on home oxygen at 2 L. She is taking a liquid antacid instead of Nexium and admits reduced by mouth intake. Claims foods makes her bloated CXR 03/22/12- I reviewed the images with patient and husband IMPRESSION:  No significant change in the severe chronic changes as above.  Original Report Authenticated By: Cyndie ChimeKEVIN G. DOVER, M.D.   07/30/12- 73 yo F never smoker, followed for severe Cystic bronchiectasis/ COPD, with hx treated atypical AFB/ MAIC, occasional hemoptysis and GERD   FOLLOWS FOR: not able to eat or drink enough; sores in nose; not wearing O2 every night due to bumps in nose, feels weak and like she needs fluids. Has lost 20 lbs over past year. Always feels hot at first to keep her home about 564 which is too cold for family. [facility]  [place]. Always productive cough without change. Exertion makes her cough more productive. She will see Dr. Hatcher/Infectious Disease again in January. Continues oxygen 2 L per minute at night and as needed in the day. Never took last prescription for Augmentin in September because she lost the prescription. Wants to continue B12 shots. No numbness in her feet.  09/11/12- 89 yo F never smoker, followed for severe Cystic bronchiectasis/ COPD, with hx treated atypical AFB/ MAIC, occasional hemoptysis and GERD   ACUTE: 86% on RA on arrival-- Dr. Ninetta LightsHatcher/ ID saw her today and thought she should be seen, heard "rubbing" in her lungs today--diff breathing worsening over last "couple" weeks w even small activities-- dry cough, denies any wheezing or chest tightness.  Exercise tolerance always poor. No distinct event but may be more DOE in last 2 weeks. No change in chronic cough, sometimes productive of yellow green, occasional small streak of blood. No fever, nodes, palpitation or chest pain. Feet swell some, dependent. Very weak. Dyspnea and weakness caused by her lung disease interfere with routine activities- dressing, grooming, ambulating through home. She needs to be able to sit frequently, and needs assistance for sustained ambulation,. She should have a light wheel chair. I don't think she is strong enough to manage a regular weight chair by herself. .  Weight stable 121-123 since October. She didn't take augmentin when given because she says she wasn't taking enough fluids for it- discussed. Has O2 concentrator for sleep but tends to leave it off- prongs bother nose.   10/29/12- 73 yo F never smoker, followed for severe Cystic bronchiectasis/ COPD, with hx treated atypical AFB/ MAIC, occasional hemoptysis and GERD FOLLOWS FOR: Pt states she has not recently been wearing her O2 at night as she feels it makes her breathing worse during the day; States she has noticed increased SOB as well. 85% RA on  arrival-took approximately 2 minutes to reach 90% RA. She finally took the antibiotic we had given her. She stays short of breath but not wearing oxygen. Her portable tank is too heavy for her. We discussed alternatives including small concentrators. She has generally sought to minimize intervention and treatment. 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  Numerous air-fluid levels appear progressive and could be due to  infection.  Original Report Authenticated By: Rudie MeyerP. Gallerani, M.D.  11/29/12- 73 yo F never smoker, followed for severe Cystic bronchiectasis/ COPD, with hx treated atypical AFB/ MAIC, occasional hemoptysis and GERD    Husband is here. FOLLOWS FOR:Pt feels she gets worse when on O2( 2L/ Advanced); unsure of how her breathing is really doing We discussed her oxygen. She says she is "afraid" of oxygen tanks and uncomfortable working with them. We discussed portable concentrators. Her husband is aware that she frequently is not compliant with therapies.  12/11/12- 73 yo F never smoker, followed for severe Cystic bronchiectasis/ COPD, with hx treated atypical AFB/ MAIC,  occasional hemoptysis and GERD    Husband is here. Acute work-in today for increased cough and shortness of breath. She wanted a nebulizer treatment and Depo-Medrol. We discussed her B12 shots and I asked her to followup with her primary physician for long-term decision about this. She has a peripheral neuropathy but the biggest problems have been cachexia and some difficulty with medication compliance. She denies fever, chills, swollen glands. She has a followup appointment with infectious disease.  02/28/13-72 yo F never smoker, followed for severe Cystic bronchiectasis/ COPD, with hx treated atypical AFB/ MAIC, occasional hemoptysis and GERD   She is willing to use portable oxygen now that she has a Designer, jewelleryportable concentrator. She never felt safe with compressed oxygen tanks. Otherwise  little has changed. She remains very weak with persistent cough which is sometimes productive of yellow or green sputum. No blood, fever or adenopathy. She desaturates easily with exertion but is comfortable at rest on room air. CXR 12/17/12 IMPRESSION:  Severe chronic cystic bronchiectasis with superimposed infection,  slightly progressed in the left midzone.  Original Report Authenticated By: Francene BoyersJames Maxwell, M.D.   Review of Systems-See HPI Constitutional:   + weight loss, No-night sweats, No- Fevers, chills, +fatigue, lassitude. HEENT:   No headaches,  Difficulty swallowing,  Tooth/dental problems,  Sore throat,                No sneezing, itching, ear ache, nasal congestion, post nasal drip,  CV:  No chest pain, orthopnea, PND, swelling in lower extremities, anasarca, +dizziness, No-palpitations GI  No heartburn, indigestion, abdominal pain, nausea, vomiting, Resp: +Shortness of breath with exertion. , no recent coughing up of blood. .  Some minimal wheezing.  Skin: no rash or lesions. GU:   MS:  + joint pain or swelling.   Psych:  No change in mood or affect. No depression + anxiety.  No memory loss.  Objective:   Physical Exam General- Alert, Oriented, Affect-appropriate, Distress- none acute; thin. Looks chronically ill, wheelchair, O2 2L / concentrator 93$ Skin- rash-none, lesions- none, excoriation- none. +Dusky hands and feet Lymphadenopathy- none Head- atraumatic            Eyes- Gross vision intact, PERRLA, conjunctivae clear secretions            Ears- Hearing, canals- normal            Nose- Clear, No-Septal dev, mucus, polyps, erosion, perforation             Throat- Mallampati II , mucosa clear , drainage- none, tonsils- atrophic Neck- flexible , trachea midline, no stridor , thyroid nl, carotid no bruit Chest - symmetrical excursion , unlabored           Heart/CV- + remains regular,rapid RRR ., no murmur , no gallop  , no rub, nl s1 s2                            JVD+  full , edema +1, stasis changes- none, varices- none           Lung-  + bilateral  Rhonchi- clearer than last visit, unlabored, cough- mild , dullness-none, rub- none           Chest wall-  Abd-  Br/ Gen/ Rectal- Not done, not indicated Extrem- cyanosis- none, clubbing, none, atrophy- none, strength- nl Neuro- grossly intact to observation             Patient ID: Stacy BernJean  Alphonzo Novak, female    DOB: 22-Jul-1941, 73 y.o.   MRN: [id number]  HPI 11/30/10-73 yo F never smoker, followed for bronchiectasis/ COPD, with hx treated atypical AFB/ MAIC, occasional hemoptysis and GERD.  Last here July 29, 2010 after normal flora on sputum cultures in October. Last antibiotic was augmentin in December. She questions if she should have had longer than 1 week treatment. Daily productive cough- yellow to light green. Shortness of breath with exertion across parking lot, up steps. . She does use Flutter occasionally. . No recent blood, fever, sweats or chest pain. Trial of Daliresp caused malaise.   03/01/11-73 yo F never smoker, followed for bronchiectasis/ COPD, with hx treated atypical AFB/ MAIC, occasional hemoptysis and GERD. No acute issues "just can't breathe and walk". She has avoided the weather extremes recently by staying in.  Cough always productive, greenish. Denies fever, sweat.  CXR- 11/30/10- reviewed with her- no definite new areas, but significant cystic bronchiectasis diffusely with some air fluid levels. PFT- 12/21/10- FEV1 1.02/ 49%; FEV1/FVC 0.54; no response to BD; TLC 0.77; DLCO 0.58    Severe obstructive disease, mild restriction.  She asks to defer formal oxygen assessment till she finishes spending summer at [facility] with her grandchildren. She has less cough in last few days, doesn't feel she could leave sputum specimen, but would like script for brand-name levaquin, which works better.   05/05/11- 84 yo F never smoker, followed for bronchiectasis/ COPD, with hx treated atypical  AFB/ MAIC, occasional hemoptysis and GERD. We gave (she requested brand-name) levaquin to hold last visit. She felt she probably needed it because of pleuritic right chest pain that lasted 2-3 days, self limited. She isn't sure there was any change in her daily productive cough- usually white. Maybe some fever once. She waited because she had upset GI with a little nausea and diarrhea. Still holding the levaquin.   07/06/11-  14 yo F never smoker, followed for bronchiectasis/ COPD, with hx treated atypical AFB/ MAIC, occasional hemoptysis and GERD. Has had flu vaccine. Has not taken the Levaquin prescription we gave previously. She was taking maintenance Zithromax to suppress bronchitis. She stayed off it for 2 weeks because of GI upset with cramps, after successfully taking it for 6 weeks. She has no GI discomfort now. She is going to try resuming maintenance Zithromax, but adding a probiotic. She did not tolerate Daliresp.  11/07/11-  2 yo F never smoker, followed for bronchiectasis/ COPD, with hx treated atypical AFB/ MAIC, occasional hemoptysis and GERD. Still coughing a lot, often productive, with looser mucus. Had tried maintenance Zithromax until she got GI upset. Remains on chronic Nexium for GERD. Discussed her recent reports of cardiac conduction problems with macrolide antibiotics. (REC- next visit- look at HR, chemistry)  01/19/12- 58 yo F never smoker, followed for bronchiectasis/ COPD, with hx treated atypical AFB/ MAIC, occasional hemoptysis and GERD. Coughing up blood-bright red; would happen off and on, but only on May 30 with no repeat today , . SOB and wheezing has increased. Always has some cough which has not changed. No easy bruising or other bleeding. No fever. Previous hemoptysis 2 or 3 years ago at which time we stopped the baby aspirin.  03/19/12-  49 yo F never smoker, followed for bronchiectasis/ COPD, with hx treated atypical AFB/ MAIC, occasional hemoptysis and  GERD. Having increased SOB past 3 weeks; losing weight and unsure why; no energy.. Has lost 10 lbs since last visit. 140>130lbs. COPD assessment test (  CAT) score 32/40. She complains of weakness, aching joints, weight loss. 2 episodes where she passed mucus per rectum-not blood. Dry eyes, nose and mouth times one month. Bloated with eating-takes her breath. Burning she-asks B12 shot. Jittery. Has slept up in a chair for years because of chronic midthoracic back pain has not gone back to see Dr. Ricki MillerPang in a long time. She asks if all of this is a side effect of Nexium. We reviewed her radiology again. CXR 01/23/12-  IMPRESSION:  Stable chronic changes with scattered air-fluid levels, as  described above.  Original Report Authenticated By: Charline BillsSRIYESH KRISHNAN, M.D.   04/02/12- 73 yo F never smoker, followed for  Cystic bronchiectasis/ COPD, with hx treated atypical AFB/ MAIC, occasional hemoptysis and GERD. Post Hospital-8/1-03/27/12 for dx bronchiectasis w/ acute exacerbation, COPD, anemia/ B12 deficiency, Failure to thrive.Treated vanc/ zithromax/ ceftazidime. Cultures all no growth. Severe diffuse cystic bronchiectasis. Had ID consult. ECHO 8/2- normal EF60%, unable to visualize for PA pressures. Treated B12 deficiency x 5 days, then outpat injection.  She has not re-established with [name]/ PCP and says there is not a current record there. Air conditioning broken, so [facility]. Eating better, not gaining weight. Weak. Thick white mucus in mouth. No fever, chills, blood, pain, nodes.   04/17/12- [name]/ INF.DIS. DISEASE, PULMONARY D/T MYCOBACTERIA - Johny SaxJeffrey Hatcher, MD 04/17/2012 12:24 PM Signed  She is not doing well based on her weight loss and probable recent exacerbation. I reviewed her CXR with her, there is not a huge difference vs her previous studies from 11-2010. She is offered to restart her MAI therapy but she wants "to get her stomach straightened out".  She has quit taking her prilosec/nexium and is relying on tums now.will get a repeat sputum AFB on her (she is given a specimen container and will return after she is able to provide a good specimen).  Needs improved nutrition and probably home O2 (i encouraged her multiple times to call Dr Maple HudsonYoung about this). We may need GI help with her stomach issues.  Will see her back on October 3.   04/30/12-  73 yo F never smoker, followed for severe Cystic bronchiectasis/ COPD, with hx treated atypical AFB/ MAIC, occasional hemoptysis and GERD. Unable to tell any difference with using O2 QHS-does not use O2 during the day. Increased SOB-not getting any better; Unable to drink much water. Minor epistaxis blamed on dryness from her oxygen. Son thinks/blood-tinged sputum occasionally, otherwise yellowish or greenish. She had stopped Nexium, blaming it for her poor appetite-it seemed to cause bloating. Instead she now uses TUMS and we discussed his meds. Complains of burning in her feet attributed by her to B12 deficiency. I again asked her to get back with her primary physician.  05/14/12- 73 yo F never smoker, followed for severe Cystic bronchiectasis/ COPD, with hx treated atypical AFB/ MAIC, occasional hemoptysis and GERD  Husband here Sputum cx 03/22/12- Nl flora. ACUTE VISIT: increased cough-yellow in color and Increased SOB-getting worse. Cough is chronically productive. In the last few days has felt more short of breath, nonspecific. Had flu vaccine. Using saline nasal spray for nasal dryness on home oxygen at 2 L. She is taking a liquid antacid instead of Nexium and admits reduced by mouth intake. Claims foods makes her bloated CXR 03/22/12- I reviewed the images with patient and husband IMPRESSION:  No significant change in the severe chronic changes as above.  Original Report Authenticated By:  Cyndie Chime, M.D.   07/30/12- 18 yo F never smoker, followed for severe Cystic bronchiectasis/ COPD, with hx  treated atypical AFB/ MAIC, occasional hemoptysis and GERD   FOLLOWS FOR: not able to eat or drink enough; sores in nose; not wearing O2 every night due to bumps in nose, feels weak and like she needs fluids. Has lost 20 lbs over past year. Always feels hot at first to keep her home about 68 which is too cold for family. Still spending most of [facility] time down at [facility]. Always productive cough without change. Exertion makes her cough more productive. She will see Dr. Hatcher/Infectious Disease again in January. Continues oxygen 2 L per minute at night and as needed in the day. Never took last prescription for Augmentin in September because she lost the prescription. Wants to continue B12 shots. No numbness in her feet.  09/11/12- 52 yo F never smoker, followed for severe Cystic bronchiectasis/ COPD, with hx treated atypical AFB/ MAIC, occasional hemoptysis and GERD   ACUTE: 86% on RA on arrival-- Dr. Ninetta Lights ID saw her today and thought she should be seen, heard "rubbing" in her lungs today--diff breathing worsening over last "couple" weeks w even small activities-- dry cough, denies any wheezing or chest tightness.  Exercise tolerance always poor. No distinct event but may be more DOE in last 2 weeks. No change in chronic cough, sometimes productive of yellow green, occasional small streak of blood. No fever, nodes, palpitation or chest pain. Feet swell some, dependent. Very weak. Dyspnea and weakness caused by her lung disease interfere with routine activities- dressing, grooming, ambulating through home. She needs to be able to sit frequently, and needs assistance for sustained ambulation,. She should have a light wheel chair. I don't think she is strong enough to manage a regular weight chair by herself. .  Weight stable 121-123 since October. She didn't take augmentin when given because she says she wasn't taking enough fluids for it- discussed. Has O2 concentrator for sleep but tends to leave it  off- prongs bother nose.   10/29/12- 70 yo F never smoker, followed for severe Cystic bronchiectasis/ COPD, with hx treated atypical AFB/ MAIC, occasional hemoptysis and GERD FOLLOWS FOR: Pt states she has not recently been wearing her O2 at night as she feels it makes her breathing worse during the day; States she has noticed increased SOB as well. 85% RA on arrival-took approximately 2 minutes to reach 90% RA. She finally took the antibiotic we had given her. She stays short of breath but not wearing oxygen. Her portable tank is too heavy for her. We discussed alternatives including small concentrators. She has generally sought to minimize intervention and treatment. 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  Numerous air-fluid levels appear progressive and could be due to  infection.  Original Report Authenticated By: Rudie Meyer, M.D.  11/29/12- 57 yo F never smoker, followed for severe Cystic bronchiectasis/ COPD, with hx treated atypical AFB/ MAIC, occasional hemoptysis and GERD    Husband is here. FOLLOWS FOR:Pt feels she gets worse when on O2( 2L/ [facility]); unsure of how her breathing is really doing We discussed her oxygen. She says she is "afraid" of oxygen tanks and uncomfortable working with them. We discussed portable concentrators. Her husband is aware that she frequently is not compliant with therapies.  12/11/12- 86 yo F never smoker, followed for severe Cystic bronchiectasis/ COPD, with hx treated atypical AFB/ MAIC, occasional hemoptysis and GERD  Husband is here. Acute work-in today for increased cough and shortness of breath. She wanted a nebulizer treatment and Depo-Medrol. We discussed her B12 shots and I asked her to followup with her primary physician for long-term decision about this. She has a peripheral neuropathy but the biggest problems have been cachexia and some difficulty with medication compliance. She denies fever, chills,  swollen glands. She has a followup appointment with infectious disease.  02/28/13-72 yo F never smoker, followed for severe Cystic bronchiectasis/ COPD, with hx treated atypical AFB/ MAIC, occasional hemoptysis and GERD   She is willing to use portable oxygen now that she has a Designer, jewellery. She never felt safe with compressed oxygen tanks. Otherwise little has changed. She remains very weak with persistent cough which is sometimes productive of yellow or green sputum. No blood, fever or adenopathy. She desaturates easily with exertion but is comfortable at rest on room air. CXR 12/17/12 IMPRESSION:  Severe chronic cystic bronchiectasis with superimposed infection,  slightly progressed in the left midzone.  Original Report Authenticated By: Francene Boyers, M.D.  06/25/13- 56 yo F never smoker, followed for severe Cystic bronchiectasis/ COPD, with hx treated atypical AFB/ MAIC, occasional hemoptysis and GERD    Husband heer FOLLOWS FOR: Pt states her breathing has gotten worse; was seen by Dr Ninetta Lights yesterday and her O2 levels were dropping. Told to follow up today. She says she desaturated while coughing at Dr Moshe Cipro. More SOB/ DOE 3 weeks. Chronic cough not changed- brownish, no blood, fever or chills. She tries using Flutter but it is not helping enough. We discussed pneumatic VEST. O2 2-3L/Min Im[employer] portable concentrator.   11/18/13- 61 yo F never smoker, followed for severe Cystic bronchiectasis/ COPD, with hx treated atypical AFB/ MAIC, occasional hemoptysis and GERD    Husband here  11/18/13- 50 yo F never smoker, followed for severe Cystic bronchiectasis/ COPD, with hx treated atypical AFB/ MAIC, occasional hemoptysis and GERD    Husband here FOLLOWS FOR: Pt c/o an increase in SOB x 2 weeks. Pt has productive cough with brown mucous x years. Pt was 84% when waiting in lobby, pt stated it was d/t coughing, with rest pt O2 increase to 92% Recent viral system acute infection started  about 2 weeks ago with increased cough and shortness of breath. Sputum white or dark with scant blood streak. Doesn't know if she had fever. Mild GI upset. Continues oxygen O2 2-3L/Min [employer] portable concentrator  Review of Systems-See HPI Constitutional:   No-weight loss, No-night sweats, No- Fevers, chills, +fatigue, lassitude. HEENT:   No headaches,  Difficulty swallowing,  Tooth/dental problems,  Sore throat,                No sneezing, itching, ear ache, nasal congestion, post nasal drip,  CV:  No chest pain, orthopnea, PND, swelling in lower extremities, anasarca, +dizziness, No-palpitations GI  No heartburn, indigestion, abdominal pain, nausea, vomiting, Resp: +Shortness of breath with exertion. , +recent coughing up of blood. .  Some minimal wheezing.  Skin: no rash or lesions. GU:   MS:  + joint pain or swelling , wheelchair Psych:  No change in mood or affect. No depression + anxiety.  No memory loss.  Objective:   Physical Exam General- Alert, Oriented, Affect-appropriate, Distress- none acute; thin. Looks chronically ill,              +wheelchair, O2 2L / concentrator 3L Skin- rash-none, lesions- none, excoriation- none.  Lymphadenopathy- none Head- atraumatic  Eyes- Gross vision intact, PERRLA, conjunctivae clear secretions            Ears- Hearing, canals- normal            Nose- Clear, No-Septal dev, mucus, polyps, erosion, perforation             Throat- Mallampati II , mucosa clear , drainage- none, tonsils- atrophic Neck- flexible , trachea midline, no stridor , thyroid nl, carotid no bruit Chest - symmetrical excursion , unlabored           Heart/CV- + remains regular,rapid RRR ., no murmur , no gallop  , no rub, nl s1 s2                            JVD+ full , edema +trace, stasis changes- none, varices- none           Lung-  + bilateral  rhonchi, unlabored, cough- mild , dullness-none, rub- none           Chest wall-  Abd-  Br/ Gen/ Rectal- Not done, not  indicated Extrem- cyanosis- none, clubbing, none, atrophy- none, strength- nl Neuro- grossly intact to observation

## 2013-11-18 NOTE — Patient Instructions (Addendum)
Script sent for augmentin  Order- DME [facility]- Home O2 2L rest, 3L exertion for continuous and portable  Dx chronic respiratory failure, bronchiectasis   Sat today 84% rest  Depo 80

## 2013-11-18 NOTE — Telephone Encounter (Signed)
Noted- Can't have 2 companies for o2. Patient had wanted local company but doesn't want to give up [employer] concentrator.

## 2013-11-27 ENCOUNTER — Ambulatory Visit (INDEPENDENT_AMBULATORY_CARE_PROVIDER_SITE_OTHER): Payer: Medicare Other | Admitting: *Deleted

## 2013-11-27 DIAGNOSIS — D518 Other vitamin B12 deficiency anemias: Secondary | ICD-10-CM

## 2013-11-27 DIAGNOSIS — D519 Vitamin B12 deficiency anemia, unspecified: Secondary | ICD-10-CM

## 2013-11-27 MED ORDER — CYANOCOBALAMIN 1000 MCG/ML IJ SOLN
1000.0000 ug | Freq: Once | INTRAMUSCULAR | Status: AC
  Administered 2013-11-27: 1000 ug via INTRAMUSCULAR

## 2013-12-15 ENCOUNTER — Encounter: Payer: Self-pay | Admitting: Internal Medicine

## 2013-12-15 NOTE — Assessment & Plan Note (Signed)
Attention to nutrition directed by her primary physician

## 2013-12-15 NOTE — Assessment & Plan Note (Signed)
Unable to document recurrence

## 2013-12-15 NOTE — Assessment & Plan Note (Signed)
Severe fibrocystic scarring/ cystic bronchiectasis. Sputum negative for staph, AFB Plan-try to suppress bronchitic component with a trial of Augmentin

## 2013-12-25 ENCOUNTER — Ambulatory Visit (INDEPENDENT_AMBULATORY_CARE_PROVIDER_SITE_OTHER): Payer: Medicare Other

## 2013-12-25 DIAGNOSIS — D518 Other vitamin B12 deficiency anemias: Secondary | ICD-10-CM

## 2013-12-25 DIAGNOSIS — D519 Vitamin B12 deficiency anemia, unspecified: Secondary | ICD-10-CM

## 2013-12-25 MED ORDER — CYANOCOBALAMIN 1000 MCG/ML IJ SOLN
1000.0000 ug | Freq: Once | INTRAMUSCULAR | Status: AC
  Administered 2013-12-25: 1000 ug via INTRAMUSCULAR

## 2014-01-02 ENCOUNTER — Ambulatory Visit (INDEPENDENT_AMBULATORY_CARE_PROVIDER_SITE_OTHER): Payer: Medicare Other | Admitting: Internal Medicine

## 2014-01-02 ENCOUNTER — Encounter: Payer: Self-pay | Admitting: Internal Medicine

## 2014-01-02 ENCOUNTER — Telehealth: Payer: Self-pay | Admitting: Internal Medicine

## 2014-01-02 ENCOUNTER — Ambulatory Visit (INDEPENDENT_AMBULATORY_CARE_PROVIDER_SITE_OTHER)
Admission: RE | Admit: 2014-01-02 | Discharge: 2014-01-02 | Disposition: A | Discharge status: Home / Self Care | Payer: Medicare Other | Source: Ambulatory Visit | Attending: Internal Medicine | Admitting: Internal Medicine

## 2014-01-02 VITALS — BP 130/70 | HR 108 | Ht 65.5 in | Wt 116.6 lb

## 2014-01-02 DIAGNOSIS — A310 Pulmonary mycobacterial infection: Secondary | ICD-10-CM

## 2014-01-02 DIAGNOSIS — J471 Bronchiectasis with (acute) exacerbation: Secondary | ICD-10-CM

## 2014-01-02 DIAGNOSIS — J479 Bronchiectasis, uncomplicated: Secondary | ICD-10-CM

## 2014-01-02 DIAGNOSIS — J470 Bronchiectasis with acute lower respiratory infection: Secondary | ICD-10-CM

## 2014-01-02 MED ORDER — DOXYCYCLINE HYCLATE 100 MG PO TABS: ORAL_TABLET | ORAL | Status: DC

## 2014-01-02 NOTE — Telephone Encounter (Signed)
I have spoke with patient-she will be here today at 2:45pm to see CY. Nothing more needed.

## 2014-01-02 NOTE — Telephone Encounter (Signed)
We will try to see today

## 2014-01-02 NOTE — Patient Instructions (Addendum)
Script doxycycline antibiotic sent  Order- CXR dx bronchiectasis  It sounds as if you need to look at alternative living arrangements that can be more accomodating. Why don't you talk with [facility] or another DME company about what they have available these days for portable concentrators. A local DME company like  [facility] may be able to provide a main concentrator, a portable concentrator, and some portable stand-by tanks.

## 2014-01-02 NOTE — Telephone Encounter (Signed)
Called spoke with pt. She is having a hard time breathing. Sounded winded on the phone. Worse x 1 week. Requests an appt today. TP has no openings. Please advise CDY thanks  Allergies  Allergen Reactions  . Daliresp [Roflumilast]     "messed with my head"  . Sulfonamide Derivatives     REACTION: nausea     Current Outpatient Prescriptions on File Prior to Visit  Medication Sig Dispense Refill  . cyanocobalamin (,VITAMIN B-12,) 1000 MCG/ML injection Inject 1 mL (1,000 mcg total) into the muscle every 30 (thirty) days.  1 mL  0   No current facility-administered medications on file prior to visit.

## 2014-01-02 NOTE — Progress Notes (Signed)
Patient ID: Randalyn RheaJean B Savoia, female    DOB: 04/23/1941, 73 y.o.   MRN: [id number]  HPI 11/30/10-73 yo F never smoker, followed for bronchiectasis/ COPD, with hx treated atypical AFB/ MAIC, occasional hemoptysis and GERD.  Last here July 29, 2010 after normal flora on sputum cultures in October. Last antibiotic was augmentin in December. She questions if she should have had longer than 1 week treatment. Daily productive cough- yellow to light green. Shortness of breath with exertion across parking lot, up steps. . She does use Flutter occasionally. . No recent blood, fever, sweats or chest pain. Trial of Daliresp caused malaise.   03/01/11-73 yo F never smoker, followed for bronchiectasis/ COPD, with hx treated atypical AFB/ MAIC, occasional hemoptysis and GERD. No acute issues "just can't breathe and walk". She has avoided the weather extremes recently by staying in.  Cough always productive, greenish. Denies fever, sweat.  CXR- 11/30/10- reviewed with her- no definite new areas, but significant cystic bronchiectasis diffusely with some air fluid levels. PFT- 12/21/10- FEV1 1.02/ 49%; FEV1/FVC 0.54; no response to BD; TLC 0.77; DLCO 0.58    Severe obstructive disease, mild restriction.  6MWT She asks to defer formal oxygen assessment till she [facility]. She has less cough in last few days, doesn't feel she could leave sputum specimen, but would like script for brand-name levaquin, which works better.   05/05/11- 73 yo F never smoker, followed for bronchiectasis/ COPD, with hx treated atypical AFB/ MAIC, occasional hemoptysis and GERD. We gave (she requested brand-name) levaquin to hold last visit. She felt she probably needed it because of pleuritic right chest pain that lasted 2-3 days, self limited. She isn't sure there was any change in her daily productive cough- usually white. Maybe some fever once. She waited because she had upset GI with a  little nausea and diarrhea. Still holding the levaquin.   07/06/11-  73 yo F never smoker, followed for bronchiectasis/ COPD, with hx treated atypical AFB/ MAIC, occasional hemoptysis and GERD. Has had flu vaccine. Has not taken the Levaquin prescription we gave previously. She was taking maintenance Zithromax to suppress bronchitis. She stayed off it for 2 weeks because of GI upset with cramps, after successfully taking it for 6 weeks. She has no GI discomfort now. She is going to try resuming maintenance Zithromax, but adding a probiotic. She did not tolerate Daliresp.  11/07/11-  73 yo F never smoker, followed for bronchiectasis/ COPD, with hx treated atypical AFB/ MAIC, occasional hemoptysis and GERD. Still coughing a lot, often productive, with looser mucus. Had tried maintenance Zithromax until she got GI upset. Remains on chronic Nexium for GERD. Discussed her recent reports of cardiac conduction problems with macrolide antibiotics. (REC- next visit- look at HR, chemistry)  01/19/12- 73 yo F never smoker, followed for bronchiectasis/ COPD, with hx treated atypical AFB/ MAIC, occasional hemoptysis and GERD. Coughing up blood-bright red; would happen off and on, but only on May 30 with no repeat today , . SOB and wheezing has increased. Always has some cough which has not changed. No easy bruising or other bleeding. No fever. Previous hemoptysis 2 or 3 years ago at which time we stopped the baby aspirin.  03/19/12-  73 yo F never smoker, followed for bronchiectasis/ COPD, with hx treated atypical AFB/ MAIC, occasional hemoptysis and GERD. Having increased SOB past 3 weeks; losing weight and unsure why; no energy.. Has lost 10 lbs since last visit.  140>130lbs. COPD assessment test (CAT) score 32/40. She complains of weakness, aching joints, weight loss. 2 episodes where she passed mucus per rectum-not blood. Dry eyes, nose and mouth times one month. Bloated with eating-takes her breath. Burning  she-asks B12 shot. Jittery. Has slept up in a chair for years because of chronic midthoracic back pain has not gone back to see Dr. [name] in a long time. She asks if all of this is a side effect of Nexium. We reviewed her radiology again. CXR 01/23/12-  IMPRESSION:  Stable chronic changes with scattered air-fluid levels, as  described above.  Original Report Authenticated By: [name], M.D.   04/02/12- 59 yo F never smoker, followed for  Cystic bronchiectasis/ COPD, with hx treated atypical AFB/ MAIC, occasional hemoptysis and GERD. [facility]03[facility]12 for dx bronchiectasis w/ acute exacerbation, COPD, anemia/ B12 deficiency, Failure to thrive.Treated vanc/ zithromax/ ceftazidime. Cultures all no growth. Severe diffuse cystic bronchiectasis. Had ID consult. ECHO 8/2- normal EF60%, unable to visualize for PA pressures. Treated B12 deficiency x 5 days, then outpat injection.  She has not re-established with [name]/ PCP and says there is not a current record there. Air conditioning broken, so she has been staying at their [name] place. Eating better, not gaining weight. Weak. Thick white mucus in mouth. No fever, chills, blood, pain, nodes.   04/17/12- [name]/ INF.DIS. DISEASE, PULMONARY D/T MYCOBACTERIA - [name], MD 04/17/2012 12:24 PM Signed  She is not doing well based on her weight loss and probable recent exacerbation. I reviewed her CXR with her, there is not a huge difference vs her previous studies from 11-2010. She is offered to restart her MAI therapy but she wants "to get her stomach straightened out". She has quit taking her prilosec/nexium and is relying on tums now.will get a repeat sputum AFB on her (she is given a specimen container and will return after she is able to provide a good specimen).  Needs improved nutrition and probably home O2 (i encouraged her multiple times to call [name] about this). We may need GI help with her stomach issues.  Will see her  back on October 3.   04/30/12-  58 yo F never smoker, followed for severe Cystic bronchiectasis/ COPD, with hx treated atypical AFB/ MAIC, occasional hemoptysis and GERD. Unable to tell any difference with using O2 QHS-does not use O2 during the day. Increased SOB-not getting any better; Unable to drink much water. Minor epistaxis blamed on dryness from her oxygen. Son thinks/blood-tinged sputum occasionally, otherwise yellowish or greenish. She had stopped Nexium, blaming it for her poor appetite-it seemed to cause bloating. Instead she now uses TUMS and we discussed his meds. Complains of burning in her feet attributed by her to B12 deficiency. I again asked her to get back with her primary physician.  05/14/12- 42 yo F never smoker, followed for severe Cystic bronchiectasis/ COPD, with hx treated atypical AFB/ MAIC, occasional hemoptysis and GERD  Husband here Sputum cx 03/22/12- Nl flora. ACUTE VISIT: increased cough-yellow in color and Increased SOB-getting worse. Cough is chronically productive. In the last few days has felt more short of breath, nonspecific. Had flu vaccine. Using saline nasal spray for nasal dryness on home oxygen at 2 L. She is taking a liquid antacid instead of Nexium and admits reduced by mouth intake. Claims foods makes her bloated CXR 03/22/12- I reviewed the images with patient and husband IMPRESSION:  No significant change in the severe chronic changes as above.  Original Report Authenticated By: Cyndie ChimeKEVIN G. DOVER, M.D.   07/30/12- 73 yo F never smoker, followed for severe Cystic bronchiectasis/ COPD, with hx treated atypical AFB/ MAIC, occasional hemoptysis and GERD   FOLLOWS FOR: not able to eat or drink enough; sores in nose; not wearing O2 every night due to bumps in nose, feels weak and like she needs fluids. Has lost 20 lbs over past year. Always feels hot at first to keep her home about 564 which is too cold for family. [facility]  [place]. Always productive cough without change. Exertion makes her cough more productive. She will see Dr. Hatcher/Infectious Disease again in January. Continues oxygen 2 L per minute at night and as needed in the day. Never took last prescription for Augmentin in September because she lost the prescription. Wants to continue B12 shots. No numbness in her feet.  09/11/12- 89 yo F never smoker, followed for severe Cystic bronchiectasis/ COPD, with hx treated atypical AFB/ MAIC, occasional hemoptysis and GERD   ACUTE: 86% on RA on arrival-- Dr. Ninetta LightsHatcher/ ID saw her today and thought she should be seen, heard "rubbing" in her lungs today--diff breathing worsening over last "couple" weeks w even small activities-- dry cough, denies any wheezing or chest tightness.  Exercise tolerance always poor. No distinct event but may be more DOE in last 2 weeks. No change in chronic cough, sometimes productive of yellow green, occasional small streak of blood. No fever, nodes, palpitation or chest pain. Feet swell some, dependent. Very weak. Dyspnea and weakness caused by her lung disease interfere with routine activities- dressing, grooming, ambulating through home. She needs to be able to sit frequently, and needs assistance for sustained ambulation,. She should have a light wheel chair. I don't think she is strong enough to manage a regular weight chair by herself. .  Weight stable 121-123 since October. She didn't take augmentin when given because she says she wasn't taking enough fluids for it- discussed. Has O2 concentrator for sleep but tends to leave it off- prongs bother nose.   10/29/12- 73 yo F never smoker, followed for severe Cystic bronchiectasis/ COPD, with hx treated atypical AFB/ MAIC, occasional hemoptysis and GERD FOLLOWS FOR: Pt states she has not recently been wearing her O2 at night as she feels it makes her breathing worse during the day; States she has noticed increased SOB as well. 85% RA on  arrival-took approximately 2 minutes to reach 90% RA. She finally took the antibiotic we had given her. She stays short of breath but not wearing oxygen. Her portable tank is too heavy for her. We discussed alternatives including small concentrators. She has generally sought to minimize intervention and treatment. 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  Numerous air-fluid levels appear progressive and could be due to  infection.  Original Report Authenticated By: Rudie MeyerP. Gallerani, M.D.  11/29/12- 73 yo F never smoker, followed for severe Cystic bronchiectasis/ COPD, with hx treated atypical AFB/ MAIC, occasional hemoptysis and GERD    Husband is here. FOLLOWS FOR:Pt feels she gets worse when on O2( 2L/ Advanced); unsure of how her breathing is really doing We discussed her oxygen. She says she is "afraid" of oxygen tanks and uncomfortable working with them. We discussed portable concentrators. Her husband is aware that she frequently is not compliant with therapies.  12/11/12- 73 yo F never smoker, followed for severe Cystic bronchiectasis/ COPD, with hx treated atypical AFB/ MAIC,  occasional hemoptysis and GERD    Husband is here. Acute work-in today for increased cough and shortness of breath. She wanted a nebulizer treatment and Depo-Medrol. We discussed her B12 shots and I asked her to followup with her primary physician for long-term decision about this. She has a peripheral neuropathy but the biggest problems have been cachexia and some difficulty with medication compliance. She denies fever, chills, swollen glands. She has a followup appointment with infectious disease.  02/28/13-72 yo F never smoker, followed for severe Cystic bronchiectasis/ COPD, with hx treated atypical AFB/ MAIC, occasional hemoptysis and GERD   She is willing to use portable oxygen now that she has a Designer, jewelleryportable concentrator. She never felt safe with compressed oxygen tanks. Otherwise  little has changed. She remains very weak with persistent cough which is sometimes productive of yellow or green sputum. No blood, fever or adenopathy. She desaturates easily with exertion but is comfortable at rest on room air. CXR 12/17/12 IMPRESSION:  Severe chronic cystic bronchiectasis with superimposed infection,  slightly progressed in the left midzone.  Original Report Authenticated By: Francene BoyersJames Maxwell, M.D.   Review of Systems-See HPI Constitutional:   + weight loss, No-night sweats, No- Fevers, chills, +fatigue, lassitude. HEENT:   No headaches,  Difficulty swallowing,  Tooth/dental problems,  Sore throat,                No sneezing, itching, ear ache, nasal congestion, post nasal drip,  CV:  No chest pain, orthopnea, PND, swelling in lower extremities, anasarca, +dizziness, No-palpitations GI  No heartburn, indigestion, abdominal pain, nausea, vomiting, Resp: +Shortness of breath with exertion. , no recent coughing up of blood. .  Some minimal wheezing.  Skin: no rash or lesions. GU:   MS:  + joint pain or swelling.   Psych:  No change in mood or affect. No depression + anxiety.  No memory loss.  Objective:   Physical Exam General- Alert, Oriented, Affect-appropriate, Distress- none acute; thin. Looks chronically ill, wheelchair, O2 2L / concentrator 93$ Skin- rash-none, lesions- none, excoriation- none. +Dusky hands and feet Lymphadenopathy- none Head- atraumatic            Eyes- Gross vision intact, PERRLA, conjunctivae clear secretions            Ears- Hearing, canals- normal            Nose- Clear, No-Septal dev, mucus, polyps, erosion, perforation             Throat- Mallampati II , mucosa clear , drainage- none, tonsils- atrophic Neck- flexible , trachea midline, no stridor , thyroid nl, carotid no bruit Chest - symmetrical excursion , unlabored           Heart/CV- + remains regular,rapid RRR ., no murmur , no gallop  , no rub, nl s1 s2                            JVD+  full , edema +1, stasis changes- none, varices- none           Lung-  + bilateral  Rhonchi- clearer than last visit, unlabored, cough- mild , dullness-none, rub- none           Chest wall-  Abd-  Br/ Gen/ Rectal- Not done, not indicated Extrem- cyanosis- none, clubbing, none, atrophy- none, strength- nl Neuro- grossly intact to observation             Patient ID: Carney BernJean  Alphonzo Severance, female    DOB: 03-20-1941, 73 y.o.   MRN: [id number]  HPI 11/30/10-73 yo F never smoker, followed for bronchiectasis/ COPD, with hx treated atypical AFB/ MAIC, occasional hemoptysis and GERD.  Last here July 29, 2010 after normal flora on sputum cultures in October. Last antibiotic was augmentin in December. She questions if she should have had longer than 1 week treatment. Daily productive cough- yellow to light green. Shortness of breath with exertion across parking lot, up steps. . She does use Flutter occasionally. . No recent blood, fever, sweats or chest pain. Trial of Daliresp caused malaise.   03/01/11-73 yo F never smoker, followed for bronchiectasis/ COPD, with hx treated atypical AFB/ MAIC, occasional hemoptysis and GERD. No acute issues "just can't breathe and walk". She has avoided the weather extremes recently by staying in.  Cough always productive, greenish. Denies fever, sweat.  CXR- 11/30/10- reviewed with her- no definite new areas, but significant cystic bronchiectasis diffusely with some air fluid levels. PFT- 12/21/10- FEV1 1.02/ 49%; FEV1/FVC 0.54; no response to BD; TLC 0.77; DLCO 0.58    Severe obstructive disease, mild restriction.  She asks to defer formal oxygen assessment till she finishes spending summer at [facility] with her grandchildren. She has less cough in last few days, doesn't feel she could leave sputum specimen, but would like script for brand-name levaquin, which works better.   05/05/11- 39 yo F never smoker, followed for bronchiectasis/ COPD, with hx treated atypical  AFB/ MAIC, occasional hemoptysis and GERD. We gave (she requested brand-name) levaquin to hold last visit. She felt she probably needed it because of pleuritic right chest pain that lasted 2-3 days, self limited. She isn't sure there was any change in her daily productive cough- usually white. Maybe some fever once. She waited because she had upset GI with a little nausea and diarrhea. Still holding the levaquin.   07/06/11-  45 yo F never smoker, followed for bronchiectasis/ COPD, with hx treated atypical AFB/ MAIC, occasional hemoptysis and GERD. Has had flu vaccine. Has not taken the Levaquin prescription we gave previously. She was taking maintenance Zithromax to suppress bronchitis. She stayed off it for 2 weeks because of GI upset with cramps, after successfully taking it for 6 weeks. She has no GI discomfort now. She is going to try resuming maintenance Zithromax, but adding a probiotic. She did not tolerate Daliresp.  11/07/11-  20 yo F never smoker, followed for bronchiectasis/ COPD, with hx treated atypical AFB/ MAIC, occasional hemoptysis and GERD. Still coughing a lot, often productive, with looser mucus. Had tried maintenance Zithromax until she got GI upset. Remains on chronic Nexium for GERD. Discussed her recent reports of cardiac conduction problems with macrolide antibiotics. (REC- next visit- look at HR, chemistry)  01/19/12- 89 yo F never smoker, followed for bronchiectasis/ COPD, with hx treated atypical AFB/ MAIC, occasional hemoptysis and GERD. Coughing up blood-bright red; would happen off and on, but only on May 30 with no repeat today , . SOB and wheezing has increased. Always has some cough which has not changed. No easy bruising or other bleeding. No fever. Previous hemoptysis 2 or 3 years ago at which time we stopped the baby aspirin.  03/19/12-  31 yo F never smoker, followed for bronchiectasis/ COPD, with hx treated atypical AFB/ MAIC, occasional hemoptysis and  GERD. Having increased SOB past 3 weeks; losing weight and unsure why; no energy.. Has lost 10 lbs since last visit. 140>130lbs. COPD assessment test (  CAT) score 32/40. She complains of weakness, aching joints, weight loss. 2 episodes where she passed mucus per rectum-not blood. Dry eyes, nose and mouth times one month. Bloated with eating-takes her breath. Burning she-asks B12 shot. Jittery. Has slept up in a chair for years because of chronic midthoracic back pain has not gone back to see Dr. Ricki MillerPang in a long time. She asks if all of this is a side effect of Nexium. We reviewed her radiology again. CXR 01/23/12-  IMPRESSION:  Stable chronic changes with scattered air-fluid levels, as  described above.  Original Report Authenticated By: Charline BillsSRIYESH KRISHNAN, M.D.   04/02/12- 73 yo F never smoker, followed for  Cystic bronchiectasis/ COPD, with hx treated atypical AFB/ MAIC, occasional hemoptysis and GERD. Post Hospital-8[facility]1-03[facility]27[facility]12 for dx bronchiectasis w/ acute exacerbation, COPD, anemia/ B12 deficiency, Failure to thrive.Treated vanc/ zithromax/ ceftazidime. Cultures all no growth. Severe diffuse cystic bronchiectasis. Had ID consult. ECHO 8/2- normal EF60%, unable to visualize for PA pressures. Treated B12 deficiency x 5 days, then outpat injection.  She has not re-established with [name]/ PCP and says there is not a current record there. Air conditioning broken, so [facility]. Eating better, not gaining weight. Weak. Thick white mucus in mouth. No fever, chills, blood, pain, nodes.   04/17/12- [name]/ INF.DIS. DISEASE, PULMONARY D/T MYCOBACTERIA - Johny SaxJeffrey Hatcher, MD 04/17/2012 12:24 PM Signed  She is not doing well based on her weight loss and probable recent exacerbation. I reviewed her CXR with her, there is not a huge difference vs her previous studies from 11-2010. She is offered to restart her MAI therapy but she wants "to get her stomach straightened out".  She has quit taking her prilosec/nexium and is relying on tums now.will get a repeat sputum AFB on her (she is given a specimen container and will return after she is able to provide a good specimen).  Needs improved nutrition and probably home O2 (i encouraged her multiple times to call Dr Maple HudsonYoung about this). We may need GI help with her stomach issues.  Will see her back on October 3.   04/30/12-  73 yo F never smoker, followed for severe Cystic bronchiectasis/ COPD, with hx treated atypical AFB/ MAIC, occasional hemoptysis and GERD. Unable to tell any difference with using O2 QHS-does not use O2 during the day. Increased SOB-not getting any better; Unable to drink much water. Minor epistaxis blamed on dryness from her oxygen. Son thinks/blood-tinged sputum occasionally, otherwise yellowish or greenish. She had stopped Nexium, blaming it for her poor appetite-it seemed to cause bloating. Instead she now uses TUMS and we discussed his meds. Complains of burning in her feet attributed by her to B12 deficiency. I again asked her to get back with her primary physician.  05/14/12- 73 yo F never smoker, followed for severe Cystic bronchiectasis/ COPD, with hx treated atypical AFB/ MAIC, occasional hemoptysis and GERD  Husband here Sputum cx 03/22/12- Nl flora. ACUTE VISIT: increased cough-yellow in color and Increased SOB-getting worse. Cough is chronically productive. In the last few days has felt more short of breath, nonspecific. Had flu vaccine. Using saline nasal spray for nasal dryness on home oxygen at 2 L. She is taking a liquid antacid instead of Nexium and admits reduced by mouth intake. Claims foods makes her bloated CXR 03/22/12- I reviewed the images with patient and husband IMPRESSION:  No significant change in the severe chronic changes as above.  Original Report Authenticated By:  Cyndie Chime, M.D.   07/30/12- 42 yo F never smoker, followed for severe Cystic bronchiectasis/ COPD, with hx  treated atypical AFB/ MAIC, occasional hemoptysis and GERD   FOLLOWS FOR: not able to eat or drink enough; sores in nose; not wearing O2 every night due to bumps in nose, feels weak and like she needs fluids. Has lost 20 lbs over past year. Always feels hot at first to keep her home about 21 which is too cold for family. Still spending most of [facility]. Always productive cough without change. Exertion makes her cough more productive. She will see Dr. Hatcher/Infectious Disease again in January. Continues oxygen 2 L per minute at night and as needed in the day. Never took last prescription for Augmentin in September because she lost the prescription. Wants to continue B12 shots. No numbness in her feet.  09/11/12- 57 yo F never smoker, followed for severe Cystic bronchiectasis/ COPD, with hx treated atypical AFB/ MAIC, occasional hemoptysis and GERD   ACUTE: 86% on RA on arrival-- Dr. Ninetta Lights ID saw her today and thought she should be seen, heard "rubbing" in her lungs today--diff breathing worsening over last "couple" weeks w even small activities-- dry cough, denies any wheezing or chest tightness.  Exercise tolerance always poor. No distinct event but may be more DOE in last 2 weeks. No change in chronic cough, sometimes productive of yellow green, occasional small streak of blood. No fever, nodes, palpitation or chest pain. Feet swell some, dependent. Very weak. Dyspnea and weakness caused by her lung disease interfere with routine activities- dressing, grooming, ambulating through home. She needs to be able to sit frequently, and needs assistance for sustained ambulation,. She should have a light wheel chair. I don't think she is strong enough to manage a regular weight chair by herself. .  Weight stable 121-123 since October. She didn't take augmentin when given because she says she wasn't taking enough fluids for it- discussed. Has O2 concentrator for sleep but tends to leave it  off- prongs bother nose.   10/29/12- 71 yo F never smoker, followed for severe Cystic bronchiectasis/ COPD, with hx treated atypical AFB/ MAIC, occasional hemoptysis and GERD FOLLOWS FOR: Pt states she has not recently been wearing her O2 at night as she feels it makes her breathing worse during the day; States she has noticed increased SOB as well. 85% RA on arrival-took approximately 2 minutes to reach 90% RA. She finally took the antibiotic we had given her. She stays short of breath but not wearing oxygen. Her portable tank is too heavy for her. We discussed alternatives including small concentrators. She has generally sought to minimize intervention and treatment. 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  Numerous air-fluid levels appear progressive and could be due to  infection.  Original Report Authenticated By: Rudie Meyer, M.D.  11/29/12- 60 yo F never smoker, followed for severe Cystic bronchiectasis/ COPD, with hx treated atypical AFB/ MAIC, occasional hemoptysis and GERD    Husband is here. FOLLOWS FOR:Pt feels she gets worse when on O2( 2L/ [facility]); unsure of how her breathing is really doing We discussed her oxygen. She says she is "afraid" of oxygen tanks and uncomfortable working with them. We discussed portable concentrators. Her husband is aware that she frequently is not compliant with therapies.  12/11/12- 5 yo F never smoker, followed for severe Cystic bronchiectasis/ COPD, with hx treated atypical AFB/ MAIC, occasional hemoptysis and GERD  Husband is here. Acute work-in today for increased cough and shortness of breath. She wanted a nebulizer treatment and Depo-Medrol. We discussed her B12 shots and I asked her to followup with her primary physician for long-term decision about this. She has a peripheral neuropathy but the biggest problems have been cachexia and some difficulty with medication compliance. She denies fever, chills,  swollen glands. She has a followup appointment with infectious disease.  02/28/13-72 yo F never smoker, followed for severe Cystic bronchiectasis/ COPD, with hx treated atypical AFB/ MAIC, occasional hemoptysis and GERD   She is willing to use portable oxygen now that she has a Designer, jewelleryportable concentrator. She never felt safe with compressed oxygen tanks. Otherwise little has changed. She remains very weak with persistent cough which is sometimes productive of yellow or green sputum. No blood, fever or adenopathy. She desaturates easily with exertion but is comfortable at rest on room air. CXR 12/17/12 IMPRESSION:  Severe chronic cystic bronchiectasis with superimposed infection,  slightly progressed in the left midzone.  Original Report Authenticated By: Francene BoyersJames Maxwell, M.D.  06/25/13- 73 yo F never smoker, followed for severe Cystic bronchiectasis/ COPD, with hx treated atypical AFB/ MAIC, occasional hemoptysis and GERD    Husband heer FOLLOWS FOR: Pt states her breathing has gotten worse; was seen by Dr Ninetta LightsHatcher yesterday and her O2 levels were dropping. Told to follow up today. She says she desaturated while coughing at Dr Moshe CiproHatcher's. More SOB/ DOE 3 weeks. Chronic cough not changed- brownish, no blood, fever or chills. She tries using Flutter but it is not helping enough. We discussed pneumatic VEST. O2 2-3L/Min [employer] portable concentrator.   11/18/13- 73 yo F never smoker, followed for severe Cystic bronchiectasis/ COPD, with hx treated atypical AFB/ MAIC, occasional hemoptysis and GERD    Husband here  11/18/13- 73 yo F never smoker, followed for severe Cystic bronchiectasis/ COPD, with hx treated atypical AFB/ MAIC, occasional hemoptysis and GERD    Husband here FOLLOWS FOR: Pt c/o an increase in SOB x 2 weeks. Pt has productive cough with brown mucous x years. Pt was 84% when waiting in lobby, pt stated it was d/t coughing, with rest pt O2 increase to 92% Recent viral system acute infection started  about 2 weeks ago with increased cough and shortness of breath. Sputum white or dark with scant blood streak. Doesn't know if she had fever. Mild GI upset. Continues oxygen O2 2-3L/Min [employer] portable concentrator  01/02/14- 73 yo F never smoker, followed for severe Cystic bronchiectasis/ COPD, with hx treated atypical AFB/ MAIC, occasional hemoptysis and GERD    Husband here ACUTE VISIT: increased SOB and wheezing x 1 week at least. Cough-productive-yellow to brown in color. Stays hot as well. Pt has feelings of passing out at times. O2 2-3L [employer] portable concentrator We discussed how to get an oxygen supply capable of providing more than 5 L. Her portable concentrator can't get over it 3 or 4 L. She did not want to change DME from [employer]. Chronic cough with yellow-brown sputum, no blood, no fever. She states she doesn't drink much. I could not get her to agree to try frequent small sips. She declines home health nurse, home physical therapy. We talked about the severity of her lung disease and her goals and expectations. [facility] but has doors wide enough to get her wheelchair through, because neither her regular home or her leg home can accommodate. I suggested it might be cheaper to  get some doorways widened at home than to continue renting a motel room.  Review of Systems-See HPI Constitutional:   No-weight loss, No-night sweats, No- Fevers, chills, +fatigue, lassitude. HEENT:   No headaches,  Difficulty swallowing,  Tooth/dental problems,  Sore throat,                No sneezing, itching, ear ache, nasal congestion, post nasal drip,  CV:  No chest pain, orthopnea, PND, swelling in lower extremities, anasarca, +dizziness, No-palpitations GI  No heartburn, indigestion, abdominal pain, nausea, vomiting, Resp: +Shortness of breath with exertion. , no- coughing up of blood. .  Some minimal wheezing.  Skin: no rash or lesions. GU:   MS:  + joint pain or swelling ,  wheelchair Psych:  No change in mood or affect. No depression + anxiety.  No memory loss.  Objective:   Physical Exam General- Alert, Oriented, Affect-appropriate, Distress- none acute; thin. Looks chronically ill,              +wheelchair, O2 3L / concentrator 3L Skin- rash-none, lesions- none, excoriation- none.  Lymphadenopathy- none Head- atraumatic            Eyes- Gross vision intact, PERRLA, conjunctivae clear secretions            Ears- Hearing, canals- normal            Nose- Clear, No-Septal dev, mucus, polyps, erosion, perforation             Throat- Mallampati II , mucosa clear , drainage- none, tonsils- atrophic Neck- flexible , trachea midline, no stridor , thyroid nl, carotid no bruit Chest - symmetrical excursion , unlabored           Heart/CV- + remains regular,rapid RRR ., no murmur , no gallop  , no rub, nl s1 s2                            JVD+ full , edema +1, stasis changes- none, varices- none           Lung-  + few bilateral  rhonchi, unlabored, cough+light , dullness-none, rub- none           Chest wall-  Abd-  Br/ Gen/ Rectal- Not done, not indicated Extrem- cyanosis- none, clubbing, none, atrophy- none, strength- nl Neuro- grossly intact to observation

## 2014-01-06 ENCOUNTER — Inpatient Hospital Stay (HOSPITAL_COMMUNITY)
Admission: EM | Admit: 2014-01-06 | Discharge: 2014-01-15 | DRG: 190 | Disposition: A | Discharge status: Home / Self Care | Payer: Medicare Other | Attending: Internal Medicine | Admitting: Internal Medicine

## 2014-01-06 ENCOUNTER — Telehealth: Payer: Self-pay | Admitting: Internal Medicine

## 2014-01-06 ENCOUNTER — Encounter (HOSPITAL_COMMUNITY): Payer: Self-pay | Admitting: Emergency Medicine

## 2014-01-06 ENCOUNTER — Emergency Department (HOSPITAL_COMMUNITY): Payer: Medicare Other

## 2014-01-06 DIAGNOSIS — Z820 Family history of epilepsy and other diseases of the nervous system: Secondary | ICD-10-CM

## 2014-01-06 DIAGNOSIS — I5033 Acute on chronic diastolic (congestive) heart failure: Secondary | ICD-10-CM | POA: Diagnosis present

## 2014-01-06 DIAGNOSIS — Z882 Allergy status to sulfonamides status: Secondary | ICD-10-CM

## 2014-01-06 DIAGNOSIS — D510 Vitamin B12 deficiency anemia due to intrinsic factor deficiency: Secondary | ICD-10-CM | POA: Diagnosis present

## 2014-01-06 DIAGNOSIS — R627 Adult failure to thrive: Secondary | ICD-10-CM | POA: Diagnosis present

## 2014-01-06 DIAGNOSIS — R1313 Dysphagia, pharyngeal phase: Secondary | ICD-10-CM | POA: Diagnosis present

## 2014-01-06 DIAGNOSIS — D518 Other vitamin B12 deficiency anemias: Secondary | ICD-10-CM | POA: Diagnosis present

## 2014-01-06 DIAGNOSIS — R64 Cachexia: Secondary | ICD-10-CM | POA: Diagnosis present

## 2014-01-06 DIAGNOSIS — Z823 Family history of stroke: Secondary | ICD-10-CM

## 2014-01-06 DIAGNOSIS — I10 Essential (primary) hypertension: Secondary | ICD-10-CM | POA: Diagnosis present

## 2014-01-06 DIAGNOSIS — A310 Pulmonary mycobacterial infection: Secondary | ICD-10-CM

## 2014-01-06 DIAGNOSIS — Z681 Body mass index (BMI) 19 or less, adult: Secondary | ICD-10-CM

## 2014-01-06 DIAGNOSIS — J9620 Acute and chronic respiratory failure, unspecified whether with hypoxia or hypercapnia: Secondary | ICD-10-CM | POA: Diagnosis present

## 2014-01-06 DIAGNOSIS — Z881 Allergy status to other antibiotic agents status: Secondary | ICD-10-CM

## 2014-01-06 DIAGNOSIS — I498 Other specified cardiac arrhythmias: Secondary | ICD-10-CM | POA: Diagnosis present

## 2014-01-06 DIAGNOSIS — Z9981 Dependence on supplemental oxygen: Secondary | ICD-10-CM

## 2014-01-06 DIAGNOSIS — D519 Vitamin B12 deficiency anemia, unspecified: Secondary | ICD-10-CM | POA: Diagnosis present

## 2014-01-06 DIAGNOSIS — K219 Gastro-esophageal reflux disease without esophagitis: Secondary | ICD-10-CM

## 2014-01-06 DIAGNOSIS — J479 Bronchiectasis, uncomplicated: Secondary | ICD-10-CM | POA: Diagnosis present

## 2014-01-06 DIAGNOSIS — I509 Heart failure, unspecified: Secondary | ICD-10-CM | POA: Diagnosis present

## 2014-01-06 DIAGNOSIS — J45901 Unspecified asthma with (acute) exacerbation: Principal | ICD-10-CM

## 2014-01-06 DIAGNOSIS — E43 Unspecified severe protein-calorie malnutrition: Secondary | ICD-10-CM | POA: Diagnosis present

## 2014-01-06 DIAGNOSIS — J189 Pneumonia, unspecified organism: Secondary | ICD-10-CM

## 2014-01-06 DIAGNOSIS — J441 Chronic obstructive pulmonary disease with (acute) exacerbation: Principal | ICD-10-CM | POA: Diagnosis present

## 2014-01-06 DIAGNOSIS — Z888 Allergy status to other drugs, medicaments and biological substances status: Secondary | ICD-10-CM

## 2014-01-06 DIAGNOSIS — J471 Bronchiectasis with (acute) exacerbation: Secondary | ICD-10-CM | POA: Diagnosis present

## 2014-01-06 LAB — PRO B NATRIURETIC PEPTIDE: Pro B Natriuretic peptide (BNP): 1290 pg/mL — ABNORMAL HIGH (ref 0–125)

## 2014-01-06 LAB — I-STAT ARTERIAL BLOOD GAS, ED
Acid-Base Excess: 9 mmol/L — ABNORMAL HIGH (ref 0.0–2.0)
Bicarbonate: 36.2 mEq/L — ABNORMAL HIGH (ref 20.0–24.0)
O2 SAT: 94 %
PH ART: 7.389 (ref 7.350–7.450)
TCO2: 38 mmol/L (ref 0–100)
pCO2 arterial: 60 mmHg (ref 35.0–45.0)
pO2, Arterial: 75 mmHg — ABNORMAL LOW (ref 80.0–100.0)

## 2014-01-06 LAB — CBC
HEMATOCRIT: 42.9 % (ref 36.0–46.0)
HEMOGLOBIN: 13.1 g/dL (ref 12.0–15.0)
MCH: 27 pg (ref 26.0–34.0)
MCHC: 30.5 g/dL (ref 30.0–36.0)
MCV: 88.5 fL (ref 78.0–100.0)
Platelets: 242 10*3/uL (ref 150–400)
RBC: 4.85 MIL/uL (ref 3.87–5.11)
RDW: 13.8 % (ref 11.5–15.5)
WBC: 6.9 10*3/uL (ref 4.0–10.5)

## 2014-01-06 LAB — BASIC METABOLIC PANEL
BUN: 12 mg/dL (ref 6–23)
CHLORIDE: 97 meq/L (ref 96–112)
CO2: 36 mEq/L — ABNORMAL HIGH (ref 19–32)
Calcium: 9.2 mg/dL (ref 8.4–10.5)
Creatinine, Ser: 0.41 mg/dL — ABNORMAL LOW (ref 0.50–1.10)
Glucose, Bld: 128 mg/dL — ABNORMAL HIGH (ref 70–99)
POTASSIUM: 3.9 meq/L (ref 3.7–5.3)
SODIUM: 141 meq/L (ref 137–147)

## 2014-01-06 LAB — I-STAT TROPONIN, ED: TROPONIN I, POC: 0.03 ng/mL (ref 0.00–0.08)

## 2014-01-06 LAB — TROPONIN I

## 2014-01-06 MED ORDER — BUDESONIDE 0.25 MG/2ML IN SUSP
0.2500 mg | Freq: Two times a day (BID) | RESPIRATORY_TRACT | Status: DC
  Administered 2014-01-07 – 2014-01-15 (×15): 0.25 mg via RESPIRATORY_TRACT
  Filled 2014-01-06 (×21): qty 2

## 2014-01-06 MED ORDER — VANCOMYCIN HCL IN DEXTROSE 1-5 GM/200ML-% IV SOLN
1000.0000 mg | Freq: Once | INTRAVENOUS | Status: AC
  Administered 2014-01-06: 1000 mg via INTRAVENOUS
  Filled 2014-01-06: qty 200

## 2014-01-06 MED ORDER — SODIUM CHLORIDE 0.9 % IJ SOLN
3.0000 mL | Freq: Two times a day (BID) | INTRAMUSCULAR | Status: DC
  Administered 2014-01-07 – 2014-01-15 (×11): 3 mL via INTRAVENOUS

## 2014-01-06 MED ORDER — CYANOCOBALAMIN 1000 MCG/ML IJ SOLN: 1000.0000 ug | INTRAMUSCULAR | Status: DC

## 2014-01-06 MED ORDER — LEVALBUTEROL HCL 0.63 MG/3ML IN NEBU
0.6300 mg | INHALATION_SOLUTION | Freq: Four times a day (QID) | RESPIRATORY_TRACT | Status: DC
  Administered 2014-01-07 – 2014-01-09 (×10): 0.63 mg via RESPIRATORY_TRACT
  Filled 2014-01-06 (×19): qty 3

## 2014-01-06 MED ORDER — ONDANSETRON HCL 4 MG PO TABS: 4.0000 mg | ORAL_TABLET | Freq: Four times a day (QID) | ORAL | Status: DC | PRN

## 2014-01-06 MED ORDER — ACETAMINOPHEN 650 MG RE SUPP: 650.0000 mg | Freq: Four times a day (QID) | RECTAL | Status: DC | PRN

## 2014-01-06 MED ORDER — CALCIUM CARBONATE ANTACID 500 MG PO CHEW
1.0000 | CHEWABLE_TABLET | Freq: Every day | ORAL | Status: DC | PRN
  Filled 2014-01-06: qty 1

## 2014-01-06 MED ORDER — IPRATROPIUM-ALBUTEROL 0.5-2.5 (3) MG/3ML IN SOLN
3.0000 mL | Freq: Once | RESPIRATORY_TRACT | Status: AC
  Administered 2014-01-06: 3 mL via RESPIRATORY_TRACT
  Filled 2014-01-06: qty 3

## 2014-01-06 MED ORDER — IPRATROPIUM BROMIDE 0.02 % IN SOLN
0.5000 mg | RESPIRATORY_TRACT | Status: DC
  Administered 2014-01-07 (×2): 0.5 mg via RESPIRATORY_TRACT
  Filled 2014-01-06 (×3): qty 2.5

## 2014-01-06 MED ORDER — POLYETHYL GLYCOL-PROPYL GLYCOL 0.4-0.3 % OP SOLN: 1.0000 [drp] | Freq: Every day | OPHTHALMIC | Status: DC | PRN

## 2014-01-06 MED ORDER — SODIUM CHLORIDE 0.9 % IJ SOLN
3.0000 mL | Freq: Two times a day (BID) | INTRAMUSCULAR | Status: DC
  Administered 2014-01-06 – 2014-01-15 (×15): 3 mL via INTRAVENOUS

## 2014-01-06 MED ORDER — DEXTROSE 5 % IV SOLN
2.0000 g | Freq: Two times a day (BID) | INTRAVENOUS | Status: DC
  Filled 2014-01-06: qty 2

## 2014-01-06 MED ORDER — VANCOMYCIN HCL 500 MG IV SOLR
500.0000 mg | Freq: Two times a day (BID) | INTRAVENOUS | Status: DC
  Administered 2014-01-07 – 2014-01-09 (×6): 500 mg via INTRAVENOUS
  Filled 2014-01-06 (×7): qty 500

## 2014-01-06 MED ORDER — ACETAMINOPHEN 325 MG PO TABS: 650.0000 mg | ORAL_TABLET | Freq: Four times a day (QID) | ORAL | Status: DC | PRN

## 2014-01-06 MED ORDER — ONDANSETRON HCL 4 MG/2ML IJ SOLN: 4.0000 mg | Freq: Four times a day (QID) | INTRAMUSCULAR | Status: DC | PRN

## 2014-01-06 MED ORDER — METHYLPREDNISOLONE SODIUM SUCC 125 MG IJ SOLR
125.0000 mg | INTRAMUSCULAR | Status: AC
  Administered 2014-01-06: 125 mg via INTRAVENOUS
  Filled 2014-01-06: qty 2

## 2014-01-06 MED ORDER — LEVALBUTEROL HCL 0.63 MG/3ML IN NEBU: 0.6300 mg | INHALATION_SOLUTION | Freq: Four times a day (QID) | RESPIRATORY_TRACT | Status: DC | PRN

## 2014-01-06 MED ORDER — ENOXAPARIN SODIUM 40 MG/0.4ML ~~LOC~~ SOLN
40.0000 mg | SUBCUTANEOUS | Status: DC
  Administered 2014-01-06 – 2014-01-14 (×8): 40 mg via SUBCUTANEOUS
  Filled 2014-01-06 (×11): qty 0.4

## 2014-01-06 MED ORDER — POLYVINYL ALCOHOL 1.4 % OP SOLN
1.0000 [drp] | Freq: Every day | OPHTHALMIC | Status: DC | PRN
  Filled 2014-01-06: qty 15

## 2014-01-06 MED ORDER — DEXTROSE 5 % IV SOLN
2.0000 g | INTRAVENOUS | Status: DC
  Administered 2014-01-06 – 2014-01-14 (×9): 2 g via INTRAVENOUS
  Filled 2014-01-06 (×10): qty 2

## 2014-01-06 MED ORDER — SODIUM CHLORIDE 0.9 % IV BOLUS (SEPSIS)
500.0000 mL | Freq: Once | INTRAVENOUS | Status: AC
  Administered 2014-01-06: 500 mL via INTRAVENOUS

## 2014-01-06 NOTE — H&P (Signed)
[facility] History and Physical  Stacy RheaJean B Montemayor ZOX:[id number] DOB: 09/16/1940 DOA: 01/06/2014  Referring physician: ER physician. PCP: Rene PaciValerie Leschber, MD  Specialists: Dr. Maple HudsonYoung. Pulmonologist.  Chief Complaint: Shortness of breath.  HPI: Stacy Novak is a 73 y.o. female with history of bronchiectasis and COPD and pernicious anemia presents to the ER because of worsening shortness of breath. Patient is on chronic home oxygen 3 L. Patient had gone to her pulmonologist 4 days ago and was prescribed doxycycline for increasing shortness of breath with productive cough. Despite taking which patient's shortness of breath worsened. In the ER patient was found to have wheezing and was placed on nebulizer and steroids. Chest x-ray shows changes consistent with bronchiectasis with air-fluid levels which are old. Patient denies any chest pain nausea vomiting abdominal pain or diarrhea.   Review of Systems: As presented in the history of presenting illness, rest negative.  Past Medical History  Diagnosis Date  . 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 COPD (chronic obstructive pulmonary disease)   . Bronchiectasis   . Hypercalcemia   . GERD (gastroesophageal reflux disease)   . Hypertension   . Asthma     hx of PNA  . Adult failure to thrive 08/06/2012    20 pound weight loss one year, anorexia, pernicious anemia   . 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 CXR 01/23/12- again shows essentially stable severe fibro--cystic scarring with bronchiectasis and air fluid levels, but no obvious progression. Desat to 86% with exertion walking room air- order portable O2   . GERD   . Pernicious anemia    Past Surgical History  Procedure Laterality Date  . Tonsillectomy    . Lung biopsy     Social History:  reports that she has never smoked. She has never used smokeless tobacco. She reports that she  does not drink alcohol or use illicit drugs. Where does patient live home. Can patient participate in ADLs? Yes.  Allergies  Allergen Reactions  . Daliresp [Roflumilast] Other (See Comments)    Mental changes, feeling weird  . Doxycycline Other (See Comments)    Bad taste in mouth, "salty coating"  . Levaquin [Levofloxacin] Palpitations and Other (See Comments)    Joint pains/aches  . Other Other (See Comments)    DAIRY PRODUCTS-causes thick mucous secretions  . Sulfonamide Derivatives Nausea Only    Family History:  Family History  Problem Relation Age of Onset  . Hypertension    . Stroke Father   . Alzheimer's disease Mother   . Atrial fibrillation Mother   . Seizures Son       Prior to Admission medications   Medication Sig Start Date End Date Taking? Authorizing Provider  calcium carbonate (TUMS - DOSED IN MG ELEMENTAL CALCIUM) 500 MG chewable tablet Chew 1 tablet by mouth daily as needed for indigestion or heartburn.   Yes Historical Provider, MD  cyanocobalamin (,VITAMIN B-12,) 1000 MCG/ML injection Inject 1 mL (1,000 mcg total) into the muscle every 30 (thirty) days. 11/13/12  Yes Newt LukesValerie A Leschber, MD  doxycycline (VIBRAMYCIN) 100 MG capsule Take 100 mg by mouth 2 (two) times daily.   Yes Historical Provider, MD  Polyethyl Glycol-Propyl Glycol (SYSTANE) 0.4-0.3 % SOLN Apply 1 drop to eye daily as needed (for dry eyes).   Yes Historical Provider, MD    Physical Exam: Filed Vitals:   01/06/14 1845 01/06/14 1900 01/06/14 1915 01/06/14  1945  BP: 139/75  141/76 120/79  Pulse: 119 111 108 110  Temp:      TempSrc:      Resp:   26 26  SpO2: 96%  94% 94%     General:  Poorly nourished not in distress.  Eyes: Anicteric no pallor.  ENT: No discharge from the ears eyes nose mouth.  Neck: No mass felt.  Cardiovascular: S1-S2 heard.  Respiratory: Mild expiratory wheeze heard no crepitations.  Abdomen: Soft nontender bowel sounds present. No guarding  rigidity.  Skin: No rash.  Musculoskeletal: No edema.  Psychiatric: Her normal.  Neurologic: Alert awake oriented to time place and person. Moves all extremities.  Labs on Admission:  Basic Metabolic Panel:  Recent Labs Lab 01/06/14 1600  NA 141  K 3.9  CL 97  CO2 36*  GLUCOSE 128*  BUN 12  CREATININE 0.41*  CALCIUM 9.2   Liver Function Tests: No results found for this basename: AST, ALT, ALKPHOS, BILITOT, PROT, ALBUMIN,  in the last 168 hours No results found for this basename: LIPASE, AMYLASE,  in the last 168 hours No results found for this basename: AMMONIA,  in the last 168 hours CBC:  Recent Labs Lab 01/06/14 1600  WBC 6.9  HGB 13.1  HCT 42.9  MCV 88.5  PLT 242   Cardiac Enzymes:  Recent Labs Lab 01/06/14 1746  TROPONINI <0.30    BNP (last 3 results)  Recent Labs  01/06/14 1746  PROBNP 1290.0*   CBG: No results found for this basename: GLUCAP,  in the last 168 hours  Radiological Exams on Admission: Dg Chest Portable 1 View  01/06/2014   CLINICAL DATA:  Shortness of breath for 5 days, history asthma, hypertension, bronchiectasis, COPD, mycobacterial disease  EXAM: PORTABLE CHEST - 1 VIEW  COMPARISON:  Portable exam 1711 hr compared to 01/02/2014  Correlation:  CT chest 03/01/2010  FINDINGS: Normal heart size and mediastinal contours.  Numerous cystic foci again identified throughout both lungs primarily in the upper lobes.  Associated bronchiectasis better identified by prior CT.  Multiple areas of nodularity are again identified.  Few scattered air-fluid levels are seen within the cystic regions.  No definite superimposed acute infiltrate, pleural effusion, or pneumothorax.  Bones appear demineralized.  IMPRESSION: Extensive cystic changes and bronchiectasis involving primarily the upper lobes with few scattered air-fluid levels, appearance similar to previous exam.  No definite superimposed acute infiltrate identified.   Electronically Signed   By:  Ulyses Southward M.D.   On: 01/06/2014 17:48    EKG: Independently reviewed. Sinus tachycardia with fusion beats.  Assessment/Plan Principal Problem:   COPD exacerbation Active Problems:   Bronchiectasis without acute exacerbation   B12 deficiency anemia   1. COPD exacerbation - patient has been placed on nebulizer and Pulmicort and empiric antibiotics. Patient did receive one dose of IV steroids in the ER which may be continued if patient continues to wheeze. 2. Bronchiectasis - have discussed with on-call pulmonologist Dr. Kendrick Fries with regarding to chest x-ray showing air fluid levels. Pulmonologist has recommended to get sputum cultures. Patient is placed on vancomycin and cefepime. 3. Elevated BNP - check 2-D echo for any cor pulmonale. Patient does not look to be in obvious fluid overload. 4. Sinus tachycardia - with EKG showing possible fusion beats we will observe in telemetry overnight. 5. Pernicious anemia - continue B12. 6. Cachexia - probably from #1 and 2.    Code Status: Full code.  Family Communication: Family at the bedside.  Disposition Plan: Admit to inpatient.    Eduard ClosArshad N Christoph Copelan [facility] Pager [phone].  If 7PM-7AM, please contact night-coverage [email or web address] Password [email or web address] 01/06/2014, 9:34 PM

## 2014-01-06 NOTE — Telephone Encounter (Signed)
Ok stop doxycycline Suggest she go to ER- they may want to admit her for a few days

## 2014-01-06 NOTE — ED Notes (Signed)
Md Harrison at bedside.  

## 2014-01-06 NOTE — ED Notes (Signed)
Pt reports chronic cough and SOB dt COPD and bronchiectasis, but states it has progressively worsened x 2 days. PT started on doxycycline by PCP with no relief. Pt able to speak in complete sentences, but noted to orthopneic, tachyphenic. Pt is AO x 4. Denies pain. Sinus tachycardia on monitor.

## 2014-01-06 NOTE — ED Notes (Signed)
Pt has copd, wears home 02, having increase in sob and cough. spo2 86% on 3L [place] at triage.

## 2014-01-06 NOTE — ED Provider Notes (Signed)
CSN: [id number]     Arrival date & time 01/06/14  1546 History   First MD Initiated Contact with Patient 01/06/14 1704     Chief Complaint  Patient presents with  . Shortness of Breath     (Consider location/radiation/quality/duration/timing/severity/associated sxs/prior Treatment) Patient is a 73 y.o. female presenting with shortness of breath. The history is provided by the patient.  Shortness of Breath Severity:  Moderate Onset quality:  Gradual Duration:  1 week Timing:  Constant Progression:  Worsening Chronicity:  Chronic Context: activity   Relieved by:  Nothing Worsened by:  Exertion, coughing and activity Ineffective treatments:  None tried Associated symptoms: cough   Associated symptoms: no abdominal pain, no chest pain, no fever, no headaches, no neck pain and no vomiting     Past Medical History  Diagnosis Date  . 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 COPD (chronic obstructive pulmonary disease)   . Bronchiectasis   . Hypercalcemia   . GERD (gastroesophageal reflux disease)   . Hypertension   . Asthma     hx of PNA  . Adult failure to thrive 08/06/2012    20 pound weight loss one year, anorexia, pernicious anemia   . 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 CXR 01/23/12- again shows essentially stable severe fibro--cystic scarring with bronchiectasis and air fluid levels, but no obvious progression. Desat to 86% with exertion walking room air- order portable O2   . GERD   . Pernicious anemia    Past Surgical History  Procedure Laterality Date  . Tonsillectomy    . Lung biopsy     Family History  Problem Relation Age of Onset  . Hypertension    . Stroke Father   . Alzheimer's disease Mother   . Atrial fibrillation Mother   . Seizures Son    History  Substance Use Topics  . Smoking status: Never Smoker   . Smokeless tobacco: Never Used  . Alcohol Use:  No   OB History   Grav Para Term Preterm Abortions TAB SAB Ect Mult Living                 Review of Systems  Constitutional: Negative for fever and fatigue.  HENT: Negative for congestion and drooling.   Eyes: Negative for pain.  Respiratory: Positive for cough and shortness of breath.   Cardiovascular: Negative for chest pain.  Gastrointestinal: Negative for nausea, vomiting, abdominal pain and diarrhea.  Genitourinary: Negative for dysuria and hematuria.  Musculoskeletal: Negative for back pain, gait problem and neck pain.  Skin: Negative for color change.  Neurological: Negative for dizziness and headaches.  Hematological: Negative for adenopathy.  Psychiatric/Behavioral: Negative for behavioral problems.  All other systems reviewed and are negative.     Allergies  Daliresp; Doxycycline; Levaquin; Other; and Sulfonamide derivatives  Home Medications   Prior to Admission medications   Medication Sig Start Date End Date Taking? Authorizing Provider  calcium carbonate (TUMS - DOSED IN MG ELEMENTAL CALCIUM) 500 MG chewable tablet Chew 1 tablet by mouth daily as needed for indigestion or heartburn.   Yes Historical Provider, MD  cyanocobalamin (,VITAMIN B-12,) 1000 MCG/ML injection Inject 1 mL (1,000 mcg total) into the muscle every 30 (thirty) days. 11/13/12  Yes Newt LukesValerie A Leschber, MD  doxycycline (VIBRAMYCIN) 100 MG capsule Take 100 mg by mouth 2 (two) times daily.   Yes Historical Provider, MD  Polyethyl Glycol-Propyl Glycol (  SYSTANE) 0.4-0.3 % SOLN Apply 1 drop to eye daily as needed (for dry eyes).   Yes Historical Provider, MD   BP 141/86  Pulse 115  Temp(Src) 98.2 F (36.8 C) (Oral)  Resp 26  SpO2 98% Physical Exam  Nursing note and vitals reviewed. Constitutional: She is oriented to person, place, and time. She appears well-developed and well-nourished.  HENT:  Head: Normocephalic.  Mouth/Throat: Oropharynx is clear and moist. No oropharyngeal exudate.  Eyes:  Conjunctivae and EOM are normal. Pupils are equal, round, and reactive to light.  Neck: Normal range of motion. Neck supple.  Cardiovascular: Normal rate, regular rhythm, normal heart sounds and intact distal pulses.  Exam reveals no gallop and no friction rub.   No murmur heard. Pulmonary/Chest: She is in respiratory distress. She has wheezes (mild and expiratory wheeze heard diffusely.).  Abdominal: Soft. Bowel sounds are normal. There is no tenderness. There is no rebound and no guarding.  Musculoskeletal: Normal range of motion. She exhibits no edema and no tenderness.  Neurological: She is alert and oriented to person, place, and time.  Skin: Skin is warm and dry.  Psychiatric: She has a normal mood and affect. Her behavior is normal.    ED Course  Procedures (including critical care time) Labs Review Labs Reviewed  BASIC METABOLIC PANEL - Abnormal; Notable for the following:    CO2 36 (*)    Glucose, Bld 128 (*)    Creatinine, Ser 0.41 (*)    All other components within normal limits  PRO B NATRIURETIC PEPTIDE - Abnormal; Notable for the following:    Pro B Natriuretic peptide (BNP) 1290.0 (*)    All other components within normal limits  BASIC METABOLIC PANEL - Abnormal; Notable for the following:    CO2 34 (*)    Glucose, Bld 130 (*)    Creatinine, Ser 0.44 (*)    All other components within normal limits  CBC - Abnormal; Notable for the following:    WBC 3.9 (*)    All other components within normal limits  I-STAT ARTERIAL BLOOD GAS, ED - Abnormal; Notable for the following:    pCO2 arterial 60.0 (*)    pO2, Arterial 75.0 (*)    Bicarbonate 36.2 (*)    Acid-Base Excess 9.0 (*)    All other components within normal limits  CULTURE, EXPECTORATED SPUTUM-ASSESSMENT  CULTURE, EXPECTORATED SPUTUM-ASSESSMENT  CBC  TROPONIN I  I-STAT TROPOININ, ED    Imaging Review Dg Chest Portable 1 View  01/06/2014   CLINICAL DATA:  Shortness of breath for 5 days, history asthma,  hypertension, bronchiectasis, COPD, mycobacterial disease  EXAM: PORTABLE CHEST - 1 VIEW  COMPARISON:  Portable exam 1711 hr compared to 01/02/2014  Correlation:  CT chest 03/01/2010  FINDINGS: Normal heart size and mediastinal contours.  Numerous cystic foci again identified throughout both lungs primarily in the upper lobes.  Associated bronchiectasis better identified by prior CT.  Multiple areas of nodularity are again identified.  Few scattered air-fluid levels are seen within the cystic regions.  No definite superimposed acute infiltrate, pleural effusion, or pneumothorax.  Bones appear demineralized.  IMPRESSION: Extensive cystic changes and bronchiectasis involving primarily the upper lobes with few scattered air-fluid levels, appearance similar to previous exam.  No definite superimposed acute infiltrate identified.   Electronically Signed   By: Ulyses Southward M.D.   On: 01/06/2014 17:48     EKG Interpretation   Date/Time:  Tuesday Jan 06 2014 15:58:15 EDT Ventricular Rate:  130  PR Interval:  112 QRS Duration: 78 QT Interval:  384 QTC Calculation: 565 R Axis:   -163 Text Interpretation:  Sinus tachycardia with Fusion complexes Right  superior axis deviation Anterior infarct , age undetermined Marked ST  abnormality, possible inferior subendocardial injury Abnormal ECG  Confirmed by Coden Franchi  MD, Yutaka Holberg ([id number]) on 01/06/2014 5:14:40 PM      MDM   Final diagnoses:  COPD exacerbation    5:45 PM 73 y.o. female with a history of COPD and bronchiectasis who presents with worsening shortness of breath for the last week. She states that she has a cough productive of brownish sputum at baseline which is not significantly changed. She saw her pulmonologist last Friday who started her on doxycycline. She notes that her cough has slightly worsened since starting the doxycycline and presents now for worsening cough and shortness of breath. She states she has intermittent fleeting abdominal and  chest pains but currently does not have this. She is afebrile and tachycardic here. O2 saturation appears to be stable in the 90s on her normal 3 L nasal cannula. Will get screening labwork, and imaging. Will give Solu-Medrol and a DuoNeb.  Pt improved but family uncomfortable taking pt home. Will admit. Will cover for CAP w/ Vanc/Cefepime as pt was previously on doxycycline w/out relief.     Junius ArgyleForrest S Aylen Stradford, MD 01/07/14 2044

## 2014-01-06 NOTE — Telephone Encounter (Signed)
Per OV 01/02/14; Script doxycycline antibiotic sent Order- CXR dx bronchiectasis It sounds as if you need to look at alternative living arrangements that can be more accomodating. Why don't you talk with [employer] or another DME company about what they have available these days for portable concentrators. A local DME company like  [employer] may be able to provide a main concentrator, a portable concentrator, and some portable stand-by tanks --  Called spoke with pt. She reports the doxy is not helping. She wants to stop it. She reports her breathing is getting worse, feels weight on chest, can;t take a good deep breathe, joints hurt. Please advise CDY tahnks  Allergies  Allergen Reactions  . Daliresp [Roflumilast]     "messed with my head"  . Sulfonamide Derivatives     REACTION: nausea     Current Outpatient Prescriptions on File Prior to Visit  Medication Sig Dispense Refill  . cyanocobalamin (,VITAMIN B-12,) 1000 MCG/ML injection Inject 1 mL (1,000 mcg total) into the muscle every 30 (thirty) days.  1 mL  0  . doxycycline (VIBRA-TABS) 100 MG tablet 2 today then one daily  10 tablet  0   No current facility-administered medications on file prior to visit.

## 2014-01-06 NOTE — Telephone Encounter (Signed)
Spoke with the pt and notified of recs per CDY  Pt verbalized understanding and will head to ED now

## 2014-01-06 NOTE — Progress Notes (Signed)
ANTIBIOTIC CONSULT NOTE - INITIAL  Pharmacy Consult for Vancomycin and Cefepime Indication: bronchiectasis  Allergies  Allergen Reactions  . Daliresp [Roflumilast] Other (See Comments)    Mental changes, feeling weird  . Doxycycline Other (See Comments)    Bad taste in mouth, "salty coating"  . Levaquin [Levofloxacin] Palpitations and Other (See Comments)    Joint pains/aches  . Other Other (See Comments)    DAIRY PRODUCTS-causes thick mucous secretions  . Sulfonamide Derivatives Nausea Only    Patient Measurements: Height: 5' 5.5" (166.4 cm) Weight: 116 lb 9.6 oz (52.889 kg) IBW/kg (Calculated) : 58.15 Adjusted Body Weight:   Vital Signs: Temp: 97.4 F (36.3 C) (05/19 2210) Temp src: Oral (05/19 1600) BP: 173/89 mmHg (05/19 2210) Pulse Rate: 115 (05/19 2210) Intake/Output from previous day:   Intake/Output from this shift:    Labs:  Recent Labs  01/06/14 1600  WBC 6.9  HGB 13.1  PLT 242  CREATININE 0.41*   Estimated Creatinine Clearance: 51.5 ml/min (by C-G formula based on Cr of 0.41). No results found for this basename: VANCOTROUGH, VANCOPEAK, VANCORANDOM, GENTTROUGH, GENTPEAK, GENTRANDOM, TOBRATROUGH, TOBRAPEAK, TOBRARND, AMIKACINPEAK, AMIKACINTROU, AMIKACIN,  in the last 72 hours   Microbiology: No results found for this or any previous visit (from the past 720 hour(s)).  Medical History: Past Medical History  Diagnosis Date  . 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 COPD (chronic obstructive pulmonary disease)   . Bronchiectasis   . Hypercalcemia   . GERD (gastroesophageal reflux disease)   . Hypertension   . Asthma     hx of PNA  . Adult failure to thrive 08/06/2012    20 pound weight loss one year, anorexia, pernicious anemia   . 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 CXR 01/23/12- again shows essentially stable severe fibro--cystic scarring with  bronchiectasis and air fluid levels, but no obvious progression. Desat to 86% with exertion walking room air- order portable O2   . GERD   . Pernicious anemia     Medications:  Scheduled:  . budesonide (PULMICORT) nebulizer solution  0.25 mg Nebulization BID  . ceFEPime (MAXIPIME) IV  2 g Intravenous Q12H  . enoxaparin (LOVENOX) injection  40 mg Subcutaneous Q24H  . [START ON 01/07/2014] ipratropium  0.5 mg Nebulization Q4H  . levalbuterol  0.63 mg Nebulization Q6H  . sodium chloride  3 mL Intravenous Q12H  . sodium chloride  3 mL Intravenous Q12H  . vancomycin  1,000 mg Intravenous Once   Assessment: 73yo female with worsening SOB, to start on Vancomycin and Cefepime for bronchiectasis.  She received Vancomycin 1000mg  in ED at ~9PM.  Cefepime 2gm IV q12 has been ordered as well, but will need adjustment for renal fxn.  Goal of Therapy:  Vancomycin trough level 15-20 mcg/ml  Plan:  1-  Continue Vancomycin 500mg  IV q12, next dose at 10AM on 5/20 2-  Change Cefepime to 2gm IV q24 3-  F/U cx data and monitor renal fxn 4-  Vancomycin Trough at ss  Marisue HumbleKendra Sanjuana Mruk, PharmD Clinical Pharmacist [facility]

## 2014-01-07 DIAGNOSIS — A310 Pulmonary mycobacterial infection: Secondary | ICD-10-CM

## 2014-01-07 DIAGNOSIS — I10 Essential (primary) hypertension: Secondary | ICD-10-CM

## 2014-01-07 DIAGNOSIS — J189 Pneumonia, unspecified organism: Secondary | ICD-10-CM

## 2014-01-07 DIAGNOSIS — J9620 Acute and chronic respiratory failure, unspecified whether with hypoxia or hypercapnia: Secondary | ICD-10-CM

## 2014-01-07 DIAGNOSIS — R627 Adult failure to thrive: Secondary | ICD-10-CM

## 2014-01-07 DIAGNOSIS — K219 Gastro-esophageal reflux disease without esophagitis: Secondary | ICD-10-CM

## 2014-01-07 LAB — EXPECTORATED SPUTUM ASSESSMENT W GRAM STAIN, RFLX TO RESP C
Special Requests: NORMAL
Special Requests: NORMAL

## 2014-01-07 LAB — BASIC METABOLIC PANEL
BUN: 16 mg/dL (ref 6–23)
CHLORIDE: 99 meq/L (ref 96–112)
CO2: 34 mEq/L — ABNORMAL HIGH (ref 19–32)
Calcium: 9 mg/dL (ref 8.4–10.5)
Creatinine, Ser: 0.44 mg/dL — ABNORMAL LOW (ref 0.50–1.10)
GFR calc Af Amer: >90 mL/min (ref >90)
GFR calc non Af Amer: >90 mL/min (ref >90)
GLUCOSE: 130 mg/dL — AB (ref 70–99)
POTASSIUM: 5 meq/L (ref 3.7–5.3)
Sodium: 140 mEq/L (ref 137–147)

## 2014-01-07 LAB — CBC
HEMATOCRIT: 41.6 % (ref 36.0–46.0)
HEMOGLOBIN: 12.5 g/dL (ref 12.0–15.0)
MCH: 26.7 pg (ref 26.0–34.0)
MCHC: 30 g/dL (ref 30.0–36.0)
MCV: 88.9 fL (ref 78.0–100.0)
Platelets: 240 10*3/uL (ref 150–400)
RBC: 4.68 MIL/uL (ref 3.87–5.11)
RDW: 14 % (ref 11.5–15.5)
WBC: 3.9 10*3/uL — ABNORMAL LOW (ref 4.0–10.5)

## 2014-01-07 LAB — EXPECTORATED SPUTUM ASSESSMENT W REFEX TO RESP CULTURE

## 2014-01-07 MED ORDER — LEVALBUTEROL HCL 0.63 MG/3ML IN NEBU: 0.6300 mg | INHALATION_SOLUTION | RESPIRATORY_TRACT | Status: DC | PRN

## 2014-01-07 MED ORDER — IPRATROPIUM BROMIDE 0.02 % IN SOLN
0.5000 mg | Freq: Four times a day (QID) | RESPIRATORY_TRACT | Status: DC
  Administered 2014-01-07 – 2014-01-09 (×9): 0.5 mg via RESPIRATORY_TRACT
  Filled 2014-01-07 (×10): qty 2.5

## 2014-01-07 MED ORDER — BIOTENE DRY MOUTH MT LIQD
15.0000 mL | Freq: Two times a day (BID) | OROMUCOSAL | Status: DC
  Administered 2014-01-08 – 2014-01-15 (×12): 15 mL via OROMUCOSAL

## 2014-01-07 NOTE — Care Management Note (Addendum)
Page 2 of 2   01/15/2014     11:58:28 AM CARE MANAGEMENT NOTE 01/15/2014  Patient:  Stacy Novak,[name]   Account Number:  [id number]  Date Initiated:  01/07/2014  Documentation initiated by:  Cyd Hostler  Subjective/Objective Assessment:   Admitted with cough and difficulty breathing.     Action/Plan:   CM to follow for disposition needs   Anticipated DC Date:  01/10/2014   Anticipated DC Plan:  HOME/SELF CARE      DC Planning Services  CM consult      PAC Choice  DURABLE MEDICAL EQUIPMENT  HOME HEALTH   Choice offered to / List presented to:  C-1 Patient   DME arranged  3-N-1  TUB BENCH      DME agency  [facility]     HH arranged  HH[facility]      [facility]   Status of service:  Completed, signed off Medicare Important Message given?  YES (If response is "NO", the following Medicare IM given date fields will be blank) Date Medicare IM given:  01/06/2014 Date Additional Medicare IM given:  01/09/2014  Discharge Disposition:  HOME W HOME HEALTH SERVICES  Per UR Regulation:  Reviewed for med. necessity/level of care/duration of stay  If discussed at Long Length of Stay Meetings, dates discussed:    Comments:  Talishia Betzler RN, BSN, MSHL, CCM  Nurse - Case Manager, ([facility] [place])  [phone]  01/13/2014 Social:  from home / hotel with husband; hx/o currently displaced from home d/t pest control issues: Rats. Patient cell  [phone] cell [address] - ([address], [place], [place] Hx/o using w/c for most ambulation needs or staying in bed since 07/2013. PCP:  Dr. Rene PaciValerie Leschber  Phone: [phone]; Fax: [phone] Hx/o non-compliance with Disease MGMT process. PT RECS:  Home health PT;Supervision for mobility/OOB DME RECS:  3in1 (PT);Other (comment) (Tub bench) Patient refuses HHS: RN and tub bench.  ([facility] provided education on how to use [facility] as shower chair if needed)     Patient reluctant to HHS PT as recommended.  CM had open discussion with patient re:  mobility issues and goals of care and short term goals.  CM encouraged HH PT in order to get back to prior baseline.  Patient agreed. Nebulizer ordered and delivered by [facility] prior to d/c. Patient c/o MD appt times being too early for her.  CM instructed to contact MD office # on AVS once home and request appt time that works best with her schedule.  CM discussed Goals of Care for post hospital f/u appt per [facility] to be seen within 7 days post d/c.  O2: Patient has home O2 through provider out of [place], [employer]. States overnights things when needed but states concern re: power outage and no portable tank in the home.  States [employer] does not provide for this.  [facility] she has but n/a.  MCR currently covers the portable system from [employer].  States she has checked with [facility] in the past but would not provide d/t not providing the entrie O2 set up system.  Patient has concentrator in the home as well as battery operated portable tank with extra battery.   Battery life 4-5 hours / battery. CM consulted with [facility] at bedside who [facility]: (Patient may contact [facility] post d/c to discuss option of portable tank/self pay  for  rental portable tank / month). CM advised that patient would need to call 911 if O2 needed and no battery or power. CM provided hard copy of research pages 9-11 [employer]://www.medicare.gov/Pubs/pdf/11045.pdf re:  MCR guidelines for Oxygen and contact # for assistance re:  if supplier refuses to provide my O2 equipment and related services as required by law. Disposition Plan:  Home / Hotel with HHS/[facility]:  PT    Zacherie Honeyman RN, BSN, MSHL, CCM  Nurse - Case Manager, ([facility] [place])  [phone]  01/13/2014 Social:  from home / hotel with husband; hx/o currently displaced from home d/t pest control issues: Rats. Hx/o  non-compliance with Disease MGMT process. IV ABX planned for 1 more day. Disposition Plan:  Home/self care.  01/09/14 1145 Camellia Wood, RN, BSN, [employer] [phone] Medicare IM given.  Anahi Belmar RN, BSN, MSHL, CCM  Nurse - Case Manager, ([facility] [place])  [phone]  01/07/2014 Social:  From home Home DME:  Home Oxygen Disposition Plan:  pending.

## 2014-01-07 NOTE — Progress Notes (Signed)
UR completed Akacia Boltz K. Gretta Samons, RN, BSN, MSHL, CCM  01/07/2014 11:30 AM

## 2014-01-07 NOTE — Consult Note (Signed)
Name: Stacy Novak MRN: [id number] DOB: May 22, 1941    ADMISSION DATE:  01/06/2014 CONSULTATION DATE:  01/07/14  REFERRING MD :  Dr. Gwenlyn Perking  PRIMARY SERVICE:  TRH  CHIEF COMPLAINT:  Hypoxemic Respiratory Failure, Abnormal CT  BRIEF PATIENT DESCRIPTION: 73 y/o F with PMH of cystic bronchiectasis, treated atypical AFB/MAC with occasional hemoptysis & 3L oxygen dependent COPD who was admitted on 5/19 with worsening SOB.  Seen prior to current admit by Dr. Maple Hudson for 1 week hx of increasing SOB, cough with productive yellow-brown sputum & was treated with doxycycline without improvement.     SIGNIFICANT EVENTS / STUDIES:  5/15 - Office visit for 1 week hx of increasing SOB, cough with productive yellow-brown sputum & was treated with doxycycline  5/19 - No improvement, went to ER for evaluation / admitted for same.    CULTURES: Sputum 5/19 >>  ANTIBIOTICS: Vanco 5/19 >> Cefepime 5/19 >>  HISTORY OF PRESENT ILLNESS:  73 y/o F, never smoker, with PMH of GERD, HTN, Pernicious Anemia, failure to thrive, cystic bronchiectasis, treated atypical AFB/MAC with occasional hemoptysis & 3L oxygen dependent COPD who was admitted on 5/19 with worsening SOB.  She was seen prior to current admit by Dr. Maple Hudson on 5/15 for 1 week hx of increasing SOB, cough with productive yellow-brown sputum & was treated with doxycycline.  Patient unfortunately did not improve on outpatient therapy and was directed to the emergency room for evaluation. ER evaluation noted the patient to be afebrile, CXR with air fluid levels that were thought to be old, elevated BNP, significant shortness of breath.  She was admitted to medical floor & treated IV antibiotics, IV steroids x 1 and nebulized bronchodilators.  PCCM consulted for evaluation of abnormal CXR, worsening SOB.    Patient endorses increased SOB from baseline, cough with sputum production as above, intermittent chest tightness, occasional swelling of LE's, and difficulty  with swallowing - chokes on solid foods.  She reports she never really feels good but this was a definite change from baseline.  No known sick contacts.  Denies fevers / chills, n/v/d  PAST MEDICAL HISTORY :  Past Medical History  Diagnosis Date  . 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 COPD (chronic obstructive pulmonary disease)   . Bronchiectasis   . Hypercalcemia   . GERD (gastroesophageal reflux disease)   . Hypertension   . Asthma     hx of PNA  . Adult failure to thrive 08/06/2012    20 pound weight loss one year, anorexia, pernicious anemia   . 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 CXR 01/23/12- again shows essentially stable severe fibro--cystic scarring with bronchiectasis and air fluid levels, but no obvious progression. Desat to 86% with exertion walking room air- order portable O2   . GERD   . Pernicious anemia    Past Surgical History  Procedure Laterality Date  . Tonsillectomy    . Lung biopsy     Prior to Admission medications   Medication Sig Start Date End Date Taking? Authorizing Provider  calcium carbonate (TUMS - DOSED IN MG ELEMENTAL CALCIUM) 500 MG chewable tablet Chew 1 tablet by mouth daily as needed for indigestion or heartburn.   Yes Historical Provider, MD  cyanocobalamin (,VITAMIN B-12,) 1000 MCG/ML injection Inject 1 mL (1,000 mcg total) into the muscle every 30 (thirty) days. 11/13/12  Yes Newt Lukes, MD  doxycycline (VIBRAMYCIN) 100  MG capsule Take 100 mg by mouth 2 (two) times daily.   Yes Historical Provider, MD  Polyethyl Glycol-Propyl Glycol (SYSTANE) 0.4-0.3 % SOLN Apply 1 drop to eye daily as needed (for dry eyes).   Yes Historical Provider, MD   Allergies  Allergen Reactions  . Daliresp [Roflumilast] Other (See Comments)    Mental changes, feeling weird  . Doxycycline Other (See Comments)    Bad taste in mouth, "salty coating"  .  Levaquin [Levofloxacin] Palpitations and Other (See Comments)    Joint pains/aches  . Other Other (See Comments)    DAIRY PRODUCTS-causes thick mucous secretions  . Sulfonamide Derivatives Nausea Only    FAMILY HISTORY:  Family History  Problem Relation Age of Onset  . Hypertension    . Stroke Father   . Alzheimer's disease Mother   . Atrial fibrillation Mother   . Seizures Son    SOCIAL HISTORY:  reports that she has never smoked. She has never used smokeless tobacco. She reports that she does not drink alcohol or use illicit drugs.  REVIEW OF SYSTEMS:   Constitutional: Negative for fever, chills, weight loss, malaise/fatigue and diaphoresis.  HENT: Negative for hearing loss, ear pain, nosebleeds, congestion, sore throat, neck pain, tinnitus and ear discharge.   Eyes: Negative for blurred vision, double vision, photophobia, pain, discharge and redness.  Respiratory: Negative for hemoptysis,and stridor.  SEE HPI for pertinent positives Cardiovascular: Negative for chest pain, palpitations, orthopnea, claudication, leg swelling and PND.  Gastrointestinal: Negative for heartburn, nausea, vomiting, abdominal pain, diarrhea, constipation, blood in stool and melena.  Genitourinary: Negative for dysuria, urgency, frequency, hematuria and flank pain.  Musculoskeletal: Negative for myalgias, back pain, joint pain and falls.  Skin: Negative for itching and rash.  Neurological: Negative for dizziness, tingling, tremors, sensory change, speech change, focal weakness, seizures, loss of consciousness, weakness and headaches.  Endo/Heme/Allergies: Negative for environmental allergies and polydipsia. Does not bruise/bleed easily.  SUBJECTIVE: "I feel better today than yesterday"  VITAL SIGNS: Temp:  [97.3 F (36.3 C)-98.2 F (36.8 C)] 97.3 F (36.3 C) (05/20 0512) Pulse Rate:  [103-128] 103 (05/20 0512) Resp:  [18-33] 18 (05/20 0512) BP: (118-173)/(64-126) 118/67 mmHg (05/20 0512) SpO2:   [86 %-98 %] 95 % (05/20 0512) Weight:  [113 lb 12.1 oz (51.6 kg)-116 lb 9.6 oz (52.889 kg)] 113 lb 12.1 oz (51.6 kg) (05/20 0512)  PHYSICAL EXAMINATION: General:  Frail elderly female in NAD Neuro:  AAOx4, speech clear, MAE HEENT:  Mm pink/moist, good dentition, no jvd Cardiovascular:  s1s2 rrr, no m/r/g Lungs:  resp's even/non-labored, lungs bilaterally with scattered crackles throughout.  Able to speak full sentences.  Abdomen:  Flat, soft, bsx4 active  Musculoskeletal:  No acute deformities  Skin:  Warm/dry, no edema   Recent Labs Lab 01/06/14 1600 01/07/14 0535  NA 141 140  K 3.9 5.0  CL 97 99  CO2 36* 34*  BUN 12 16  CREATININE 0.41* 0.44*  GLUCOSE 128* 130*    Recent Labs Lab 01/06/14 1600 01/07/14 0535  HGB 13.1 12.5  HCT 42.9 41.6  WBC 6.9 3.9*  PLT 242 240   Dg Chest Portable 1 View  01/06/2014   CLINICAL DATA:  Shortness of breath for 5 days, history asthma, hypertension, bronchiectasis, COPD, mycobacterial disease  EXAM: PORTABLE CHEST - 1 VIEW  COMPARISON:  Portable exam 1711 hr compared to 01/02/2014  Correlation:  CT chest 03/01/2010  FINDINGS: Normal heart size and mediastinal contours.  Numerous cystic foci again identified throughout  both lungs primarily in the upper lobes.  Associated bronchiectasis better identified by prior CT.  Multiple areas of nodularity are again identified.  Few scattered air-fluid levels are seen within the cystic regions.  No definite superimposed acute infiltrate, pleural effusion, or pneumothorax.  Bones appear demineralized.  IMPRESSION: Extensive cystic changes and bronchiectasis involving primarily the upper lobes with few scattered air-fluid levels, appearance similar to previous exam.  No definite superimposed acute infiltrate identified.   Electronically Signed   By: Ulyses SouthwardMark  Boles M.D.   On: 01/06/2014 17:48    ASSESSMENT / PLAN:  Acute on Chronic Respiratory Failure Cystic Bronchiectasis Treated Atypical AFB/MAC Hypoxemia  - 3L pulse O2 dependent at baseline Dysphagia - concern for contribution to current symptoms, has been suspected in past  Plan: -continue cefepime, vancomycin for a total of 8 days, may d/c vanc if cultures are negative however. -may consider involvement of ID for her chronic disease and MAC if becomes a larger issue. -continue pulmicort -adjusted neb regimen -2D ECHO to be followed upon by primary -PRN CXR, no significant changes on current film -Note no increase in O2 demands at this time -assess sputum culture -hold further CT imaging for now, imaging is unchanged from previous on CXR -flutter valve -hold further steroids -swallow evaluation as I suspect chronic aspiration  Patient currently lives in a hotel and has ongoing social issues.  Will ask social work to assist with outpatient needs.   Attending MD to follow.   CC Dr. Maple HudsonYoung.   Canary BrimBrandi Ollis, NP-C [facility] Pgr: [phone] or [phone]  Patient current condition is unchanged from before, agree with above plan, please insure that patient follows up with Dr. Maple HudsonYoung as outpatient and address social conditions.  PCCM will sign off, please call back if needed.  Patient seen and examined, agree with above note.  I dictated the care and orders written for this patient under my direction.  Alyson ReedyWesam G Gladies Sofranko, MD [phone]  01/07/2014, 11:06 AM

## 2014-01-07 NOTE — Evaluation (Signed)
Clinical/Bedside Swallow Evaluation Patient Details  Name: Stacy Novak MRN: [id number] Date of Birth: 11/01/1940  Today's Date: 01/07/2014 Time: [id number] SLP Time Calculation (min): 19 min  Past Medical History:  Past Medical History  Diagnosis Date  . 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 COPD (chronic obstructive pulmonary disease)   . Bronchiectasis   . Hypercalcemia   . GERD (gastroesophageal reflux disease)   . Hypertension   . Asthma     hx of PNA  . Adult failure to thrive 08/06/2012    20 pound weight loss one year, anorexia, pernicious anemia   . 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 CXR 01/23/12- again shows essentially stable severe fibro--cystic scarring with bronchiectasis and air fluid levels, but no obvious progression. Desat to 86% with exertion walking room air- order portable O2   . GERD   . Pernicious anemia    Past Surgical History:  Past Surgical History  Procedure Laterality Date  . Tonsillectomy    . Lung biopsy     HPI:  73 y/o F with PMH of cystic bronchiectasis, treated atypical AFB/MAC with occasional hemoptysis & 3L oxygen dependent COPD who was admitted on 5/19 with worsening SOB.  Seen prior to current admit by Dr. Maple HudsonYoung for 1 week hx of increasing SOB, cough with productive yellow-brown sputum & was treated with doxycycline without improvement.     Assessment / Plan / Recommendation Clinical Impression  Pt presents with a delayed throat clear across consistencies, despite a swallow response that appears timely and with adequate hyolaryngeal movement to palpation. Given subjective c/o a globus sensation and greater difficulty with solid foods, this may be attributable to possible esophageal component in a pt with a known h/o GERD. However, given h/o COPD and decreased respiratory status, would recommend MBS to assess oropharyngeal swallow function.      Aspiration Risk  Moderate    Diet Recommendation Regular;Thin liquid   Liquid Administration via: Cup;Straw Medication Administration: Whole meds with liquid Supervision: Patient able to self feed Compensations: Slow rate;Small sips/bites Postural Changes and/or Swallow Maneuvers: Seated upright 90 degrees;Upright 30-60 min after meal    Other  Recommendations Recommended Consults: MBS Oral Care Recommendations: Oral care BID   Follow Up Recommendations  Other (comment) (TBD pending objective testing)    Frequency and Duration        Pertinent Vitals/Pain N/A    SLP Swallow Goals     Swallow Study Prior Functional Status       General Date of Onset: 01/06/14 HPI: 73 y/o F with PMH of cystic bronchiectasis, treated atypical AFB/MAC with occasional hemoptysis & 3L oxygen dependent COPD who was admitted on 5/19 with worsening SOB.  Seen prior to current admit by Dr. Maple HudsonYoung for 1 week hx of increasing SOB, cough with productive yellow-brown sputum & was treated with doxycycline without improvement.   Type of Study: Bedside swallow evaluation Previous Swallow Assessment: none in chart Diet Prior to this Study: Regular;Thin liquids Temperature Spikes Noted: No Respiratory Status: Nasal cannula (2L) History of Recent Intubation: No Behavior/Cognition: Alert;Cooperative;Pleasant mood Oral Cavity - Dentition: Adequate natural dentition Self-Feeding Abilities: Able to feed self Patient Positioning: Upright in bed Baseline Vocal Quality: Clear Volitional Swallow: Able to elicit    Oral/Motor/Sensory Function Overall Oral Motor/Sensory Function: Appears within functional limits for tasks assessed   Ice Chips Ice chips: Not tested   Thin Liquid Thin Liquid: Impaired  Presentation: Cup;Self Fed;Straw Pharyngeal  Phase Impairments: Throat Clearing - Delayed    Nectar Thick Nectar Thick Liquid: Not tested   Honey Thick Honey Thick Liquid: Not tested   Puree Puree:  Impaired Presentation: Spoon;Self Fed Pharyngeal Phase Impairments: Throat Clearing - Delayed   Solid   GO    Solid: Impaired Presentation: Self Fed Pharyngeal Phase Impairments: Throat Clearing - Delayed        Maxcine HamLaura Paiewonsky, M.A. CCC-SLP [phone]  Maxcine HamLaura Paiewonsky 01/07/2014,4:25 PM

## 2014-01-08 ENCOUNTER — Inpatient Hospital Stay (HOSPITAL_COMMUNITY): Payer: Medicare Other

## 2014-01-08 LAB — EXPECTORATED SPUTUM ASSESSMENT W REFEX TO RESP CULTURE: SPECIAL REQUESTS: NORMAL

## 2014-01-08 LAB — EXPECTORATED SPUTUM ASSESSMENT W GRAM STAIN, RFLX TO RESP C

## 2014-01-08 MED ORDER — FUROSEMIDE 20 MG PO TABS
20.0000 mg | ORAL_TABLET | Freq: Every day | ORAL | Status: DC
  Administered 2014-01-08 – 2014-01-11 (×4): 20 mg via ORAL
  Filled 2014-01-08 (×4): qty 1

## 2014-01-08 MED ORDER — ENSURE COMPLETE PO LIQD
237.0000 mL | Freq: Two times a day (BID) | ORAL | Status: DC
  Administered 2014-01-10 – 2014-01-11 (×2): 237 mL via ORAL

## 2014-01-08 NOTE — Progress Notes (Signed)
[facility] PROGRESS NOTE  Stacy Novak ZOX:[id number] DOB: 02/28/1941 DOA: 01/06/2014 PCP: Stacy PaciValerie Leschber, MD  Assessment/Plan: 1. Acute on chronic resp failure due to bronchiectasis with exacerbation - patient has been placed on nebulizer and Pulmicort and empiric antibiotics. Pulmonary service consulted (appreciate rec's and assistance); will start flutter valve, no further steroids for now; follow recommended nebulizer tx's 2. Bronchiectasis - follow pulmonary cx's. Continue vanc and cefepime (tx for a total of 8 days) 3. Elevated BNP - due to mild exacerbation of diastolic dysfunction. Will use low dose lasix, strict intake and output, follow weight 4. Sinus tachycardia - has remained sinus, will avoid albuterol and use xopenex instead. D/c telemetry at this moment. 5. Pernicious anemia - continue B12. 6. Cachexia - probably from #1 and 2. Will start ensure TID 7. Dysphagia and aspiration concerns: will ask SPT to evaluate and provide rec's.  Code Status:Full Family Communication: no family at bedside Disposition Plan: home when medically stable   Consultants:  Pulmonary service  Procedures:  See below for x-ray reports  Antibiotics:  vanc and cefepime  HPI/Subjective: Breathing better, no CP and currently afebrile  Objective: Filed Vitals:   01/07/14 2144  BP: 133/69  Pulse: 103  Temp: 97.5 F (36.4 C)  Resp: 20    Intake/Output Summary (Last 24 hours) at 01/08/14 0003 Last data filed at 01/07/14 2149  Gross per 24 hour  Intake   1083 ml  Output    350 ml  Net    733 ml   Filed Weights   01/06/14 2141 01/07/14 0512  Weight: 52.889 kg (116 lb 9.6 oz) 51.6 kg (113 lb 12.1 oz)    Exam:   General:  Breathing better, afebrile  Cardiovascular: S1 and S2, no rubs or gallops  Respiratory: no wheezing, decrease BS at bases, scattered rhonchi  Abdomen: soft, NT, ND, positive BS  Musculoskeletal: trace edema bilaterally   Data Reviewed: Basic  Metabolic Panel:  Recent Labs Lab 01/06/14 1600 01/07/14 0535  NA 141 140  K 3.9 5.0  CL 97 99  CO2 36* 34*  GLUCOSE 128* 130*  BUN 12 16  CREATININE 0.41* 0.44*  CALCIUM 9.2 9.0   CBC:  Recent Labs Lab 01/06/14 1600 01/07/14 0535  WBC 6.9 3.9*  HGB 13.1 12.5  HCT 42.9 41.6  MCV 88.5 88.9  PLT 242 240   Cardiac Enzymes:  Recent Labs Lab 01/06/14 1746  TROPONINI <0.30   BNP (last 3 results)  Recent Labs  01/06/14 1746  PROBNP 1290.0*    Recent Results (from the past 240 hour(s))  CULTURE, EXPECTORATED SPUTUM-ASSESSMENT     Status: None   Collection Time    01/07/14  1:07 AM      Result Value Ref Range Status   Specimen Description SPUTUM   Final   Special Requests Normal   Final   Sputum evaluation     Final   Value: MICROSCOPIC FINDINGS SUGGEST THAT THIS SPECIMEN IS NOT REPRESENTATIVE OF LOWER RESPIRATORY SECRETIONS. PLEASE RECOLLECT.     RESULT CALLED TO, READ BACK BY AND VERIFIED WITHMaylene Roes: A. BEUENDIA RN [phone] GREEN R   Report Status 01/07/2014 FINAL   Final  CULTURE, EXPECTORATED SPUTUM-ASSESSMENT     Status: None   Collection Time    01/07/14  8:39 PM      Result Value Ref Range Status   Specimen Description SPUTUM   Final   Special Requests Normal   Final   Sputum evaluation  Final   Value: MICROSCOPIC FINDINGS SUGGEST THAT THIS SPECIMEN IS NOT REPRESENTATIVE OF LOWER RESPIRATORY SECRETIONS. PLEASE RECOLLECT.     CALLED TO Redgie GrayerM FUTRELL,RN 01/07/14 2121 [facility]   Report Status 01/07/2014 FINAL   Final     Studies: Dg Chest Portable 1 View  01/06/2014   CLINICAL DATA:  Shortness of breath for 5 days, history asthma, hypertension, bronchiectasis, COPD, mycobacterial disease  EXAM: PORTABLE CHEST - 1 VIEW  COMPARISON:  Portable exam 1711 hr compared to 01/02/2014  Correlation:  CT chest 03/01/2010  FINDINGS: Normal heart size and mediastinal contours.  Numerous cystic foci again identified throughout both lungs primarily in the upper lobes.   Associated bronchiectasis better identified by prior CT.  Multiple areas of nodularity are again identified.  Few scattered air-fluid levels are seen within the cystic regions.  No definite superimposed acute infiltrate, pleural effusion, or pneumothorax.  Bones appear demineralized.  IMPRESSION: Extensive cystic changes and bronchiectasis involving primarily the upper lobes with few scattered air-fluid levels, appearance similar to previous exam.  No definite superimposed acute infiltrate identified.   Electronically Signed   By: Ulyses SouthwardMark  Boles M.D.   On: 01/06/2014 17:48    Scheduled Meds: . antiseptic oral rinse  15 mL Mouth Rinse BID  . budesonide (PULMICORT) nebulizer solution  0.25 mg Nebulization BID  . ceFEPime (MAXIPIME) IV  2 g Intravenous Q24H  . enoxaparin (LOVENOX) injection  40 mg Subcutaneous Q24H  . ipratropium  0.5 mg Nebulization Q6H  . levalbuterol  0.63 mg Nebulization Q6H  . sodium chloride  3 mL Intravenous Q12H  . sodium chloride  3 mL Intravenous Q12H  . vancomycin  500 mg Intravenous Q12H    Time spent: >30 minutes   Vassie Lollarlos Leisl Spurrier  [facility] Pager [phone]. If 7PM-7AM, please contact night-coverage at [email or web address], password [email or web address] 01/08/2014, 12:03 AM  LOS: 2 days

## 2014-01-08 NOTE — Progress Notes (Signed)
[facility] PROGRESS NOTE  Stacy Novak:[id number] DOB: 02-Feb-1941 DOA: 01/06/2014 PCP: Rene Paci, MD  Assessment/Plan: 1. 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 Pulmonary service consulted (appreciate rec's and assistance); will continue flutter valve, no further steroids for now. 2. Cystic Bronchiectasis - follow pulmonary cx's. Continue vanc and cefepime (tx for a total of 8 days; currently day 2) 3. Elevated BNP - due to mild exacerbation of diastolic dysfunction. Will use low dose lasix, strict intake and output, follow daily weight and low sodium diet. 4. Sinus tachycardia - has remained sinus, will avoid albuterol and use xopenex instead. D/c telemetry at this moment. 5. Pernicious anemia - continue B12. 6. Cachexia - probably from #1 and 2. Will continue ensure TID 7. Dysphagia and aspiration concerns: per SPT will follow regular diet with thin liquids  Code Status:Full Family Communication: no family at bedside Disposition Plan: home when medically stable   Consultants:  Pulmonary service  Procedures:  See below for x-ray reports  Antibiotics:  vanc and cefepime  HPI/Subjective: Breathing continue to improve, no CP and no fever.  Objective: Filed Vitals:   01/08/14 2217  BP: 150/76  Pulse: 122  Temp: 98.2 F (36.8 C)  Resp: 18    Intake/Output Summary (Last 24 hours) at 01/08/14 2221 Last data filed at 01/08/14 1950  Gross per 24 hour  Intake   1093 ml  Output   1551 ml  Net   -458 ml   Filed Weights   01/06/14 2141 01/07/14 0512 01/08/14 0616  Weight: 52.889 kg (116 lb 9.6 oz) 51.6 kg (113 lb 12.1 oz) 52 kg (114 lb 10.2 oz)    Exam:   General:  Breathing better, afebrile and in NAD  Cardiovascular: S1 and S2, no rubs or gallops  Respiratory: no wheezing, improved air movement, scattered rhonchi  Abdomen: soft, NT,  ND, positive BS  Musculoskeletal: trace edema bilaterally   Data Reviewed: Basic Metabolic Panel:  Recent Labs Lab 01/06/14 1600 01/07/14 0535  NA 141 140  K 3.9 5.0  CL 97 99  CO2 36* 34*  GLUCOSE 128* 130*  BUN 12 16  CREATININE 0.41* 0.44*  CALCIUM 9.2 9.0   CBC:  Recent Labs Lab 01/06/14 1600 01/07/14 0535  WBC 6.9 3.9*  HGB 13.1 12.5  HCT 42.9 41.6  MCV 88.5 88.9  PLT 242 240   Cardiac Enzymes:  Recent Labs Lab 01/06/14 1746  TROPONINI <0.30   BNP (last 3 results)  Recent Labs  01/06/14 1746  PROBNP 1290.0*    Recent Results (from the past 240 hour(s))  CULTURE, EXPECTORATED SPUTUM-ASSESSMENT     Status: None   Collection Time    01/07/14  1:07 AM      Result Value Ref Range Status   Specimen Description SPUTUM   Final   Special Requests Normal   Final   Sputum evaluation     Final   Value: MICROSCOPIC FINDINGS SUGGEST THAT THIS SPECIMEN IS NOT REPRESENTATIVE OF LOWER RESPIRATORY SECRETIONS. PLEASE RECOLLECT.     RESULT CALLED TO, READ BACK BY AND VERIFIED WITHMaylene Roes RN [phone] GREEN R   Report Status 01/07/2014 FINAL   Final  CULTURE, EXPECTORATED SPUTUM-ASSESSMENT     Status: None   Collection Time    01/07/14  8:39 PM      Result Value Ref Range Status   Specimen Description SPUTUM  Final   Special Requests Normal   Final   Sputum evaluation     Final   Value: MICROSCOPIC FINDINGS SUGGEST THAT THIS SPECIMEN IS NOT REPRESENTATIVE OF LOWER RESPIRATORY SECRETIONS. PLEASE RECOLLECT.     CALLED TO Redgie GrayerM FUTRELL,RN 01/07/14 2121 [facility]   Report Status 01/07/2014 FINAL   Final  CULTURE, EXPECTORATED SPUTUM-ASSESSMENT     Status: None   Collection Time    01/07/14 11:24 PM      Result Value Ref Range Status   Specimen Description SPUTUM   Final   Special Requests Normal   Final   Sputum evaluation     Final   Value: THIS SPECIMEN IS ACCEPTABLE. RESPIRATORY CULTURE REPORT TO FOLLOW.   Report Status 01/08/2014 FINAL   Final   CULTURE, RESPIRATORY (NON-EXPECTORATED)     Status: None   Collection Time    01/07/14 11:24 PM      Result Value Ref Range Status   Specimen Description TRACHEAL ASPIRATE   Final   Special Requests NONE   Final   Gram Stain     Final   Value: RARE WBC PRESENT, PREDOMINANTLY PMN     NO SQUAMOUS EPITHELIAL CELLS SEEN     NO ORGANISMS SEEN     [facility]   Culture PENDING   Incomplete   Report Status PENDING   Incomplete     Studies: [facility] Swallow[facility]  01/08/2014   Maxcine HamLaura Paiewonsky, CCC-SLP     01/08/2014 11:14 AM Objective Swallowing Evaluation: Modified Barium Swallowing Study   Patient Details  Name: Stacy Novak MRN: [id number] Date of Birth: 04/12/1941  Today's Date: 01/08/2014 Time: 1035-1101 SLP Time Calculation (min): 26 min  Past Medical History:  Past Medical History  Diagnosis Date  . 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 COPD (chronic obstructive pulmonary disease)   . Bronchiectasis   . Hypercalcemia   . GERD (gastroesophageal reflux disease)   . Hypertension   . Asthma     hx of PNA  . Adult failure to thrive 08/06/2012    20 pound weight loss one year, anorexia, pernicious anemia   . Bronchiectasis with acute exacerbation     CXR 11/30/10- Stable cystic bronchiectasis with air fluid  levels. CXR 01/23/12- again shows essentially stable severe  fibro--cystic scarring with bronchiectasis and air fluid levels,  but no obvious progression. Desat to 86% with exertion walking  room air- order portable O2   . GERD   . Pernicious anemia    Past Surgical History:  Past Surgical History  Procedure Laterality Date  . Tonsillectomy    . Lung biopsy     HPI:  73 y/o F with PMH of cystic bronchiectasis, 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  Seen prior to current  admit by Dr. Maple HudsonYoung for 1 week hx of increasing SOB, cough with  productive  yellow-brown sputum & was treated with doxycycline  without improvement.       Assessment / Plan / Recommendation Clinical Impression  Dysphagia Diagnosis: Mild pharyngeal phase dysphagia;Suspected  primary esophageal dysphagia Clinical impression: Pt presents with a mild pharyngeal phase  dysphagia, that is primarily timing and sensory based, although  with a suspected esophageal component. Pt had frank, silent  penetration with larger straw sips, requiring a cued throat  clear. Small cup sips were effective at increasing airway  protection, with trace penetrates clearing upon completion of the  swallow. Small amounts of retrograde flow to the cervical  esophagus was noted with thin liquids, although material was not  visualized to re-enter the pharynx. Recommend to continue regular  textures and thin liquids by cup sips only, with adherence to  reflux and aspiration precautions.     Treatment Recommendation  Therapy as outlined in treatment plan below    Diet Recommendation Regular;Thin liquid   Liquid Administration via: Cup;No straw Medication Administration: Whole meds with puree Supervision: Patient able to self feed;Intermittent supervision  to cue for compensatory strategies Compensations: Slow rate;Small sips/bites Postural Changes and/or Swallow Maneuvers: Seated upright 90  degrees;Upright 30-60 min after meal    Other  Recommendations Oral Care Recommendations: Oral care BID   Follow Up Recommendations  None    Frequency and Duration min 1 x/week  1 week   Pertinent Vitals/Pain N/A    SLP Swallow Goals     General Date of Onset: 01/06/14 HPI: 73 y/o F with PMH of cystic bronchiectasis, 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  Seen prior to current  admit by Dr. Maple Hudson for 1 week hx of increasing SOB, cough with  productive yellow-brown sputum & was treated with doxycycline  without improvement.   Type of Study: Modified  Barium Swallowing Study Reason for Referral: Objectively evaluate swallowing function Previous Swallow Assessment: BSE 5/20 with frequent throat  clearing Diet Prior to this Study: Regular;Thin liquids Temperature Spikes Noted: No Respiratory Status: Nasal cannula (2L) History of Recent Intubation: No Behavior/Cognition: Alert;Cooperative;Pleasant mood;Other  (comment) (pt appeared anxious throughout evaluation) Oral Cavity - Dentition: Adequate natural dentition Oral Motor / Sensory Function: Within functional limits Self-Feeding Abilities: Able to feed self Patient Positioning: Upright in chair Baseline Vocal Quality: Clear Volitional Cough: Strong Volitional Swallow: Able to elicit Anatomy: Within functional limits Pharyngeal Secretions: Not observed secondary MBS    Reason for Referral Objectively evaluate swallowing function   Oral Phase Oral Preparation/Oral Phase Oral Phase: WFL   Pharyngeal Phase Pharyngeal Phase Pharyngeal Phase: Impaired Pharyngeal - Thin Pharyngeal - Thin Cup: Delayed swallow initiation;Premature  spillage to valleculae;Reduced airway/laryngeal  closure;Penetration/Aspiration during swallow Penetration/Aspiration details (thin cup): Material enters  airway, remains ABOVE vocal cords then ejected out Pharyngeal - Thin Straw: Delayed swallow initiation;Premature  spillage to valleculae;Reduced airway/laryngeal  closure;Penetration/Aspiration during swallow Penetration/Aspiration details (thin straw): Material enters  airway, remains ABOVE vocal cords and not ejected out Pharyngeal - Solids Pharyngeal - Puree: Within functional limits Pharyngeal - Mechanical Soft: Within functional limits Pharyngeal - Pill: Within functional limits  Cervical Esophageal Phase    GO    Cervical Esophageal Phase Cervical Esophageal Phase: Impaired Cervical Esophageal Phase - Thin Thin Cup: Esophageal backflow into cervical esophagus Thin Straw: Esophageal backflow into cervical esophagus Cervical Esophageal  Phase - Solids Puree: Within functional limits Mechanical Soft: Within functional limits Pill: Within functional limits         Maxcine Ham, M.A. CCC-SLP [phone]  Maxcine Ham 01/08/2014, 11:13 AM     Scheduled Meds: . antiseptic oral rinse  15 mL Mouth Rinse BID  . budesonide (PULMICORT) nebulizer solution  0.25 mg Nebulization BID  . ceFEPime (MAXIPIME) IV  2 g Intravenous Q24H  . enoxaparin (LOVENOX) injection  40 mg Subcutaneous Q24H  . feeding supplement (ENSURE COMPLETE)  237 mL Oral BID BM  . furosemide  20 mg Oral Daily  .  ipratropium  0.5 mg Nebulization Q6H  . levalbuterol  0.63 mg Nebulization Q6H  . sodium chloride  3 mL Intravenous Q12H  . sodium chloride  3 mL Intravenous Q12H  . vancomycin  500 mg Intravenous Q12H    Time spent: >30 minutes   Vassie Lollarlos Shawnda Mauney  [facility] Pager [phone]. If 7PM-7AM, please contact night-coverage at [email or web address], password [email or web address] 01/08/2014, 10:21 PM  LOS: 2 days

## 2014-01-08 NOTE — Progress Notes (Signed)
Quick Note:  ATC pt. Unable to leave VM. WCB. ______ 

## 2014-01-08 NOTE — Procedures (Signed)
Objective Swallowing Evaluation: Modified Barium Swallowing Study  Patient Details  Name: Stacy Novak MRN: [id number] Date of Birth: 07/19/1941  Today's Date: 01/08/2014 Time: 1035-1101 SLP Time Calculation (min): 26 min  Past Medical History:  Past Medical History  Diagnosis Date  . 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 COPD (chronic obstructive pulmonary disease)   . Bronchiectasis   . Hypercalcemia   . GERD (gastroesophageal reflux disease)   . Hypertension   . Asthma     hx of PNA  . Adult failure to thrive 08/06/2012    20 pound weight loss one year, anorexia, pernicious anemia   . 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 CXR 01/23/12- again shows essentially stable severe fibro--cystic scarring with bronchiectasis and air fluid levels, but no obvious progression. Desat to 86% with exertion walking room air- order portable O2   . GERD   . Pernicious anemia    Past Surgical History:  Past Surgical History  Procedure Laterality Date  . Tonsillectomy    . Lung biopsy     HPI:  73 y/o F with PMH of cystic bronchiectasis, treated atypical AFB/MAC with occasional hemoptysis & 3L oxygen dependent COPD who was admitted on 5/19 with worsening SOB.  Seen prior to current admit by Dr. Maple HudsonYoung for 1 week hx of increasing SOB, cough with productive yellow-brown sputum & was treated with doxycycline without improvement.       Assessment / Plan / Recommendation Clinical Impression  Dysphagia Diagnosis: Mild pharyngeal phase dysphagia;Suspected primary esophageal dysphagia Clinical impression: Pt presents with a mild pharyngeal phase dysphagia, that is primarily timing and sensory based, although with a suspected esophageal component. Pt had frank, silent penetration with larger straw sips, requiring a cued throat clear. Small cup sips were effective at increasing airway protection, with trace  penetrates clearing upon completion of the swallow. Small amounts of retrograde flow to the cervical esophagus was noted with thin liquids, although material was not visualized to re-enter the pharynx. Recommend to continue regular textures and thin liquids by cup sips only, with adherence to reflux and aspiration precautions.     Treatment Recommendation  Therapy as outlined in treatment plan below    Diet Recommendation Regular;Thin liquid   Liquid Administration via: Cup;No straw Medication Administration: Whole meds with puree Supervision: Patient able to self feed;Intermittent supervision to cue for compensatory strategies Compensations: Slow rate;Small sips/bites Postural Changes and/or Swallow Maneuvers: Seated upright 90 degrees;Upright 30-60 min after meal    Other  Recommendations Oral Care Recommendations: Oral care BID   Follow Up Recommendations  None    Frequency and Duration min 1 x/week  1 week   Pertinent Vitals/Pain N/A    SLP Swallow Goals     General Date of Onset: 01/06/14 HPI: 73 y/o F with PMH of cystic bronchiectasis, treated atypical AFB/MAC with occasional hemoptysis & 3L oxygen dependent COPD who was admitted on 5/19 with worsening SOB.  Seen prior to current admit by Dr. Maple HudsonYoung for 1 week hx of increasing SOB, cough with productive yellow-brown sputum & was treated with doxycycline without improvement.   Type of Study: Modified Barium Swallowing Study Reason for Referral: Objectively evaluate swallowing function Previous Swallow Assessment: BSE 5/20 with frequent throat clearing Diet Prior to this Study: Regular;Thin liquids Temperature Spikes Noted: No Respiratory Status: Nasal cannula (2L) History of Recent Intubation: No Behavior/Cognition: Alert;Cooperative;Pleasant mood;Other (comment) (pt appeared anxious throughout evaluation)  Oral Cavity - Dentition: Adequate natural dentition Oral Motor / Sensory Function: Within functional  limits Self-Feeding Abilities: Able to feed self Patient Positioning: Upright in chair Baseline Vocal Quality: Clear Volitional Cough: Strong Volitional Swallow: Able to elicit Anatomy: Within functional limits Pharyngeal Secretions: Not observed secondary MBS    Reason for Referral Objectively evaluate swallowing function   Oral Phase Oral Preparation/Oral Phase Oral Phase: WFL   Pharyngeal Phase Pharyngeal Phase Pharyngeal Phase: Impaired Pharyngeal - Thin Pharyngeal - Thin Cup: Delayed swallow initiation;Premature spillage to valleculae;Reduced airway/laryngeal closure;Penetration/Aspiration during swallow Penetration/Aspiration details (thin cup): Material enters airway, remains ABOVE vocal cords then ejected out Pharyngeal - Thin Straw: Delayed swallow initiation;Premature spillage to valleculae;Reduced airway/laryngeal closure;Penetration/Aspiration during swallow Penetration/Aspiration details (thin straw): Material enters airway, remains ABOVE vocal cords and not ejected out Pharyngeal - Solids Pharyngeal - Puree: Within functional limits Pharyngeal - Mechanical Soft: Within functional limits Pharyngeal - Pill: Within functional limits  Cervical Esophageal Phase    GO    Cervical Esophageal Phase Cervical Esophageal Phase: Impaired Cervical Esophageal Phase - Thin Thin Cup: Esophageal backflow into cervical esophagus Thin Straw: Esophageal backflow into cervical esophagus Cervical Esophageal Phase - Solids Puree: Within functional limits Mechanical Soft: Within functional limits Pill: Within functional limits         Maxcine HamLaura Paiewonsky, M.A. CCC-SLP [phone]  Maxcine HamLaura Paiewonsky 01/08/2014, 11:13 AM

## 2014-01-08 NOTE — Progress Notes (Signed)
Patient evaluated for community based chronic disease management services with [facility] as a benefit of patient's [employer]. Spoke with patient and spouse at bedside to explain [facility].  They have declined services at this time.  Left contact information and [facility] literature at bedside. Made Inpatient Case Manager aware that [facility]. Of note, [facility] services does not replace or interfere with any services that are arranged by inpatient case management or social work.  For additional questions or referrals please contact Anibal Hendersonim Henderson BSN RN [facility] Liaison at [phone].

## 2014-01-09 LAB — BASIC METABOLIC PANEL
BUN: 17 mg/dL (ref 6–23)
CHLORIDE: 98 meq/L (ref 96–112)
CO2: 34 meq/L — AB (ref 19–32)
Calcium: 8.7 mg/dL (ref 8.4–10.5)
Creatinine, Ser: 0.45 mg/dL — ABNORMAL LOW (ref 0.50–1.10)
GFR calc non Af Amer: >90 mL/min (ref >90)
Glucose, Bld: 85 mg/dL (ref 70–99)
POTASSIUM: 4 meq/L (ref 3.7–5.3)
SODIUM: 138 meq/L (ref 137–147)

## 2014-01-09 LAB — GLUCOSE, CAPILLARY: GLUCOSE-CAPILLARY: 100 mg/dL — AB (ref 70–99)

## 2014-01-09 MED ORDER — LEVALBUTEROL HCL 0.63 MG/3ML IN NEBU
0.6300 mg | INHALATION_SOLUTION | Freq: Three times a day (TID) | RESPIRATORY_TRACT | Status: DC
  Administered 2014-01-10 – 2014-01-15 (×15): 0.63 mg via RESPIRATORY_TRACT
  Filled 2014-01-09 (×31): qty 3

## 2014-01-09 MED ORDER — IPRATROPIUM BROMIDE 0.02 % IN SOLN
0.5000 mg | Freq: Three times a day (TID) | RESPIRATORY_TRACT | Status: DC
  Administered 2014-01-10 – 2014-01-15 (×15): 0.5 mg via RESPIRATORY_TRACT
  Filled 2014-01-09 (×15): qty 2.5

## 2014-01-09 MED ORDER — DILTIAZEM HCL ER COATED BEADS 120 MG PO CP24
120.0000 mg | ORAL_CAPSULE | Freq: Every day | ORAL | Status: DC
  Administered 2014-01-10: 120 mg via ORAL
  Filled 2014-01-09: qty 1

## 2014-01-09 NOTE — Progress Notes (Signed)
Medicare Important Message given. Stacy Sasaki J. Kalii Chesmore, RN, BSN, NCM [phone].   

## 2014-01-09 NOTE — Progress Notes (Addendum)
[facility] PROGRESS NOTE  Stacy Novak ZOX:[id number] DOB: 05/03/1941 DOA: 01/06/2014 PCP: Rene Paci, MD  Assessment/Plan: 1. 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 Pulmonary service consulted (appreciate rec's and assistance); will continue flutter valve, no further steroids for now. Breathing continue improving. 2. Cystic Bronchiectasis - follow pulmonary cx's. Continue vanc and cefepime (tx for a total of 8 days; currently day 3). After discussing with pulmonary service and following results from pulmonary cx; will discontinue vanc after 1 more dose. Finish therapy just with cefepime (covering mainly pseudomonas agents) 3. Elevated BNP - due to mild exacerbation of diastolic dysfunction. Will use low dose lasix, strict intake and output, follow daily weight and low sodium diet. 4. Sinus tachycardia and HTN - has remained sinus, will avoid albuterol and use xopenex instead. Will add cardizem daily for BP; will also help with tachycardia. 5. Pernicious anemia - continue B12. 6. Cachexia - probably from #1 and 2. Will continue feeding supplements and nutrition service recommendations 7. Dysphagia and aspiration concerns: per SPT will follow regular diet with thin liquids; upright position aduring and after meals; avoid the use of straws  Code Status:Full Family Communication: no family at bedside Disposition Plan: home when medically stable   Consultants:  Pulmonary service  Procedures:  See below for x-ray reports  Antibiotics:  vanc and cefepime  HPI/Subjective: Breathing continue improving. No fever. Patient tolerating abx's well  Objective: Filed Vitals:   01/09/14 2046  BP: 148/77  Pulse: 121  Temp: 97.4 F (36.3 C)  Resp: 18    Intake/Output Summary (Last 24 hours) at 01/09/14 2254 Last data filed at 01/09/14 2148  Gross per 24 hour   Intake    580 ml  Output   1700 ml  Net  -1120 ml   Filed Weights   01/07/14 0512 01/08/14 0616 01/09/14 0746  Weight: 51.6 kg (113 lb 12.1 oz) 52 kg (114 lb 10.2 oz) 50.803 kg (112 lb)    Exam:   General:  Breathing better, afebrile and in NAD  Cardiovascular: S1 and S2, no rubs or gallops  Respiratory: no wheezing, improved air movement, scattered rhonchi  Abdomen: soft, NT, ND, positive BS  Musculoskeletal: trace edema bilaterally   Data Reviewed: Basic Metabolic Panel:  Recent Labs Lab 01/06/14 1600 01/07/14 0535 01/09/14 0724  NA 141 140 138  K 3.9 5.0 4.0  CL 97 99 98  CO2 36* 34* 34*  GLUCOSE 128* 130* 85  BUN 12 16 17   CREATININE 0.41* 0.44* 0.45*  CALCIUM 9.2 9.0 8.7   CBC:  Recent Labs Lab 01/06/14 1600 01/07/14 0535  WBC 6.9 3.9*  HGB 13.1 12.5  HCT 42.9 41.6  MCV 88.5 88.9  PLT 242 240   Cardiac Enzymes:  Recent Labs Lab 01/06/14 1746  TROPONINI <0.30   BNP (last 3 results)  Recent Labs  01/06/14 1746  PROBNP 1290.0*    Recent Results (from the past 240 hour(s))  CULTURE, EXPECTORATED SPUTUM-ASSESSMENT     Status: None   Collection Time    01/07/14  1:07 AM      Result Value Ref Range Status   Specimen Description SPUTUM   Final   Special Requests Normal   Final   Sputum evaluation     Final   Value: MICROSCOPIC FINDINGS SUGGEST THAT THIS SPECIMEN IS NOT REPRESENTATIVE OF LOWER RESPIRATORY SECRETIONS. PLEASE RECOLLECT.     RESULT  CALLED TO, READ BACK BY AND VERIFIED WITHMaylene Roes RN [phone] GREEN R   Report Status 01/07/2014 FINAL   Final  CULTURE, EXPECTORATED SPUTUM-ASSESSMENT     Status: None   Collection Time    01/07/14  8:39 PM      Result Value Ref Range Status   Specimen Description SPUTUM   Final   Special Requests Normal   Final   Sputum evaluation     Final   Value: MICROSCOPIC FINDINGS SUGGEST THAT THIS SPECIMEN IS NOT REPRESENTATIVE OF LOWER RESPIRATORY SECRETIONS. PLEASE RECOLLECT.     CALLED TO Redgie Grayer 01/07/14 2121 [facility] M   Report Status 01/07/2014 FINAL   Final  CULTURE, EXPECTORATED SPUTUM-ASSESSMENT     Status: None   Collection Time    01/07/14 11:24 PM      Result Value Ref Range Status   Specimen Description SPUTUM   Final   Special Requests Normal   Final   Sputum evaluation     Final   Value: THIS SPECIMEN IS ACCEPTABLE. RESPIRATORY CULTURE REPORT TO FOLLOW.   Report Status 01/08/2014 FINAL   Final  CULTURE, RESPIRATORY (NON-EXPECTORATED)     Status: None   Collection Time    01/07/14 11:24 PM      Result Value Ref Range Status   Specimen Description TRACHEAL ASPIRATE   Final   Special Requests NONE   Final   Gram Stain     Final   Value: RARE WBC PRESENT, PREDOMINANTLY PMN     NO SQUAMOUS EPITHELIAL CELLS SEEN     NO ORGANISMS SEEN     Performed at [facility]   Culture     Final   Value: Non-Pathogenic Oropharyngeal-type Flora Isolated.     Performed at [facility]   Report Status PENDING   Incomplete     Studies: Dg Swallowing [facility] Pathology  01/08/2014   Maxcine Ham, CCC-SLP     01/08/2014 11:14 AM Objective Swallowing Evaluation: Modified Barium Swallowing Study   Patient Details  Name: Stacy Novak MRN: [id number] Date of Birth: 29-Aug-1940  Today's Date: 01/08/2014 Time: 1035-1101 SLP Time Calculation (min): 26 min  Past Medical History:  Past Medical History  Diagnosis Date  . 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 COPD (chronic obstructive pulmonary disease)   . Bronchiectasis   . Hypercalcemia   . GERD (gastroesophageal reflux disease)   . Hypertension   . Asthma     hx of PNA  . Adult failure to thrive 08/06/2012    20 pound weight loss one year, anorexia, pernicious anemia   . Bronchiectasis with acute exacerbation     CXR 11/30/10- Stable cystic bronchiectasis with air fluid  levels. CXR 01/23/12- again shows essentially stable severe  fibro--cystic scarring with bronchiectasis and air fluid  levels,  but no obvious progression. Desat to 86% with exertion walking  room air- order portable O2   . GERD   . Pernicious anemia    Past Surgical History:  Past Surgical History  Procedure Laterality Date  . Tonsillectomy    . Lung biopsy     HPI:  73 y/o F with PMH of cystic bronchiectasis, 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  Seen prior to current  admit by Dr. Maple Hudson for 1 week hx of increasing SOB, cough with  productive yellow-brown sputum & was  treated with doxycycline  without improvement.       Assessment / Plan / Recommendation Clinical Impression  Dysphagia Diagnosis: Mild pharyngeal phase dysphagia;Suspected  primary esophageal dysphagia Clinical impression: Pt presents with a mild pharyngeal phase  dysphagia, that is primarily timing and sensory based, although  with a suspected esophageal component. Pt had frank, silent  penetration with larger straw sips, requiring a cued throat  clear. Small cup sips were effective at increasing airway  protection, with trace penetrates clearing upon completion of the  swallow. Small amounts of retrograde flow to the cervical  esophagus was noted with thin liquids, although material was not  visualized to re-enter the pharynx. Recommend to continue regular  textures and thin liquids by cup sips only, with adherence to  reflux and aspiration precautions.     Treatment Recommendation  Therapy as outlined in treatment plan below    Diet Recommendation Regular;Thin liquid   Liquid Administration via: Cup;No straw Medication Administration: Whole meds with puree Supervision: Patient able to self feed;Intermittent supervision  to cue for compensatory strategies Compensations: Slow rate;Small sips/bites Postural Changes and/or Swallow Maneuvers: Seated upright 90  degrees;Upright 30-60 min after meal    Other  Recommendations Oral Care Recommendations: Oral care BID   Follow Up  Recommendations  None    Frequency and Duration min 1 x/week  1 week   Pertinent Vitals/Pain N/A    SLP Swallow Goals     General Date of Onset: 01/06/14 HPI: 73 y/o F with PMH of cystic bronchiectasis, 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  Seen prior to current  admit by Dr. Maple Hudson for 1 week hx of increasing SOB, cough with  productive yellow-brown sputum & was treated with doxycycline  without improvement.   Type of Study: Modified Barium Swallowing Study Reason for Referral: Objectively evaluate swallowing function Previous Swallow Assessment: BSE 5/20 with frequent throat  clearing Diet Prior to this Study: Regular;Thin liquids Temperature Spikes Noted: No Respiratory Status: Nasal cannula (2L) History of Recent Intubation: No Behavior/Cognition: Alert;Cooperative;Pleasant mood;Other  (comment) (pt appeared anxious throughout evaluation) Oral Cavity - Dentition: Adequate natural dentition Oral Motor / Sensory Function: Within functional limits Self-Feeding Abilities: Able to feed self Patient Positioning: Upright in chair Baseline Vocal Quality: Clear Volitional Cough: Strong Volitional Swallow: Able to elicit Anatomy: Within functional limits Pharyngeal Secretions: Not observed secondary MBS    Reason for Referral Objectively evaluate swallowing function   Oral Phase Oral Preparation/Oral Phase Oral Phase: WFL   Pharyngeal Phase Pharyngeal Phase Pharyngeal Phase: Impaired Pharyngeal - Thin Pharyngeal - Thin Cup: Delayed swallow initiation;Premature  spillage to valleculae;Reduced airway/laryngeal  closure;Penetration/Aspiration during swallow Penetration/Aspiration details (thin cup): Material enters  airway, remains ABOVE vocal cords then ejected out Pharyngeal - Thin Straw: Delayed swallow initiation;Premature  spillage to valleculae;Reduced airway/laryngeal  closure;Penetration/Aspiration during swallow  Penetration/Aspiration details (thin straw): Material enters  airway, remains ABOVE vocal cords and not ejected out Pharyngeal - Solids Pharyngeal - Puree: Within functional limits Pharyngeal - Mechanical Soft: Within functional limits Pharyngeal - Pill: Within functional limits  Cervical Esophageal Phase    GO    Cervical Esophageal Phase Cervical Esophageal Phase: Impaired Cervical Esophageal Phase - Thin Thin Cup: Esophageal backflow into cervical esophagus Thin Straw: Esophageal backflow into cervical esophagus Cervical Esophageal Phase - Solids Puree: Within functional limits Mechanical Soft: Within functional limits Pill: Within functional limits  Maxcine HamLaura Paiewonsky, M.A. CCC-SLP [phone]  Maxcine HamLaura Paiewonsky 01/08/2014, 11:13 AM     Scheduled Meds: . antiseptic oral rinse  15 mL Mouth Rinse BID  . budesonide (PULMICORT) nebulizer solution  0.25 mg Nebulization BID  . ceFEPime (MAXIPIME) IV  2 g Intravenous Q24H  . enoxaparin (LOVENOX) injection  40 mg Subcutaneous Q24H  . feeding supplement (ENSURE COMPLETE)  237 mL Oral BID BM  . furosemide  20 mg Oral Daily  . [START ON 01/10/2014] ipratropium  0.5 mg Nebulization TID  . [START ON 01/10/2014] levalbuterol  0.63 mg Nebulization TID  . sodium chloride  3 mL Intravenous Q12H  . sodium chloride  3 mL Intravenous Q12H  . vancomycin  500 mg Intravenous Q12H    Time spent: >30 minutes   Vassie Lollarlos Mikella Linsley  [facility] Pager [phone]. If 7PM-7AM, please contact night-coverage at [email or web address], password [email or web address] 01/09/2014, 10:54 PM  LOS: 3 days

## 2014-01-09 NOTE — Progress Notes (Signed)
ANTIBIOTIC CONSULT NOTE  - follow up Pharmacy Consult for Vancomycin and Cefepime Indication: bronchiectasis  Allergies  Allergen Reactions  . Daliresp [Roflumilast] Other (See Comments)    Mental changes, feeling weird  . Doxycycline Other (See Comments)    Bad taste in mouth, "salty coating"  . Levaquin [Levofloxacin] Palpitations and Other (See Comments)    Joint pains/aches  . Other Other (See Comments)    DAIRY PRODUCTS-causes thick mucous secretions  . Sulfonamide Derivatives Nausea Only    Patient Measurements: Height: 5' 5.5" (166.4 cm) Weight: 112 lb (50.803 kg) (SCALE c) IBW/kg (Calculated) : 58.15   Vital Signs: Temp: 98 F (36.7 C) (05/22 0709) Temp src: Oral (05/22 0709) BP: 134/73 mmHg (05/22 0709) Pulse Rate: 101 (05/22 0709) Intake/Output from previous day: 05/21 0701 - 05/22 0700 In: 843 [P.O.:840; I.V.:3] Out: 1401 [Urine:1400; Stool:1] Intake/Output from this shift: Total I/O In: -  Out: 350 [Urine:350]  Labs:  Recent Labs  01/06/14 1600 01/07/14 0535 01/09/14 0724  WBC 6.9 3.9*  --   HGB 13.1 12.5  --   PLT 242 240  --   CREATININE 0.41* 0.44* 0.45*   Estimated Creatinine Clearance: 49.5 ml/min (by C-G formula based on Cr of 0.45). No results found for this basename: VANCOTROUGH, Leodis Binet, VANCORANDOM, GENTTROUGH, GENTPEAK, GENTRANDOM, TOBRATROUGH, TOBRAPEAK, TOBRARND, AMIKACINPEAK, AMIKACINTROU, AMIKACIN,  in the last 72 hours   Microbiology: Recent Results (from the past 720 hour(s))  CULTURE, EXPECTORATED SPUTUM-ASSESSMENT     Status: None   Collection Time    01/07/14  1:07 AM      Result Value Ref Range Status   Specimen Description SPUTUM   Final   Special Requests Normal   Final   Sputum evaluation     Final   Value: MICROSCOPIC FINDINGS SUGGEST THAT THIS SPECIMEN IS NOT REPRESENTATIVE OF LOWER RESPIRATORY SECRETIONS. PLEASE RECOLLECT.     RESULT CALLED TO, READ BACK BY AND VERIFIED WITHMaylene Roes RN [phone] GREEN R   Report Status 01/07/2014 FINAL   Final  CULTURE, EXPECTORATED SPUTUM-ASSESSMENT     Status: None   Collection Time    01/07/14  8:39 PM      Result Value Ref Range Status   Specimen Description SPUTUM   Final   Special Requests Normal   Final   Sputum evaluation     Final   Value: MICROSCOPIC FINDINGS SUGGEST THAT THIS SPECIMEN IS NOT REPRESENTATIVE OF LOWER RESPIRATORY SECRETIONS. PLEASE RECOLLECT.     CALLED TO Redgie Grayer 01/07/14 2121 [facility] M   Report Status 01/07/2014 FINAL   Final  CULTURE, EXPECTORATED SPUTUM-ASSESSMENT     Status: None   Collection Time    01/07/14 11:24 PM      Result Value Ref Range Status   Specimen Description SPUTUM   Final   Special Requests Normal   Final   Sputum evaluation     Final   Value: THIS SPECIMEN IS ACCEPTABLE. RESPIRATORY CULTURE REPORT TO FOLLOW.   Report Status 01/08/2014 FINAL   Final  CULTURE, RESPIRATORY (NON-EXPECTORATED)     Status: None   Collection Time    01/07/14 11:24 PM      Result Value Ref Range Status   Specimen Description TRACHEAL ASPIRATE   Final   Special Requests NONE   Final   Gram Stain     Final   Value: RARE WBC PRESENT, PREDOMINANTLY PMN     NO SQUAMOUS EPITHELIAL CELLS SEEN     NO ORGANISMS SEEN  [facility]   Culture     Final   Value: Non-Pathogenic Oropharyngeal-type Flora Isolated.     [facility]   Report Status PENDING   Incomplete    Assessment: 73yo female with worsening SOB, started on Vancomycin and Cefepime for bronchiectasis on the evening of 01/06/14.  WBC down to 3.9.  Creat stable at 0.45.  Afebrile.  Sputum cx: non-pathogenic oral flora, not yet final.  Pulmonary rec 8 days of cefepime and may d/c vanc of cultures are negative.  Dr. Gwenlyn PerkingMadera plans to continue vanc/cefepime today and if cx remain negative tomorrow, he will stop vancomycin and continue cefepime for an 8 day course.   Goal of Therapy:  Vancomycin trough level 15-20 mcg/ml  Plan:   1-  Continue Vancomycin 500mg  IV q12 2-  Continue Cefepime to 2gm IV q24 3 - f/u cx to stop vancomycin tomorrow  Herby AbrahamMichelle T. Darianne Muralles, Pharm.D. [phone] 01/09/2014 10:45 AM

## 2014-01-09 NOTE — Progress Notes (Signed)
Speech Language Pathology Treatment: Dysphagia  Patient Details Name: Stacy Novak MRN: [id number] DOB: 1940-08-31 Today's Date: 01/09/2014 Time: 3167-4255 SLP Time Calculation (min): 12 min  Assessment / Plan / Recommendation Clinical Impression  Pt seen for f/u dysphagia treatment with husband and son present. SLP reviewed results of MBS with patient and family. Pt consumed minimal trials of thin liquids via cup sips with no overt s/s of aspiration, utilizing safe swallowing strategies with Mod I. No further f/u is recommended at this time.   HPI HPI: 73 y/o F with PMH of cystic bronchiectasis, treated atypical AFB/MAC with occasional hemoptysis & 3L oxygen dependent COPD who was admitted on 5/19 with worsening SOB.  Seen prior to current admit by Dr. Annamaria Boots for 1 week hx of increasing SOB, cough with productive yellow-brown sputum & was treated with doxycycline without improvement.     Pertinent Vitals N/A  SLP Plan  All goals met    Recommendations Diet recommendations: Regular;Thin liquid Liquids provided via: Cup;No straw Medication Administration: Whole meds with puree Supervision: Patient able to self feed;Intermittent supervision to cue for compensatory strategies Compensations: Slow rate;Small sips/bites Postural Changes and/or Swallow Maneuvers: Seated upright 90 degrees;Upright 30-60 min after meal              Oral Care Recommendations: Oral care BID Follow up Recommendations: None Plan: All goals met    GO      Germain Osgood, M.A. CCC-SLP [phone]  Germain Osgood 01/09/2014, 12:09 PM

## 2014-01-10 DIAGNOSIS — D510 Vitamin B12 deficiency anemia due to intrinsic factor deficiency: Secondary | ICD-10-CM

## 2014-01-10 LAB — CULTURE, RESPIRATORY

## 2014-01-10 LAB — CULTURE, RESPIRATORY W GRAM STAIN

## 2014-01-10 MED ORDER — DILTIAZEM HCL ER 60 MG PO CP12
60.0000 mg | ORAL_CAPSULE | Freq: Two times a day (BID) | ORAL | Status: DC
  Administered 2014-01-10 – 2014-01-11 (×2): 60 mg via ORAL
  Filled 2014-01-10 (×3): qty 1

## 2014-01-10 NOTE — Progress Notes (Signed)
[facility] PROGRESS NOTE  Stacy Novak RUE:[id number] DOB: 08/07/1941 DOA: 01/06/2014 PCP: Rene PaciValerie Leschber, MD  Assessment/Plan: 1. 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 Pulmonary service consulted (appreciate rec's and assistance); will continue flutter valve, no further steroids for now. Breathing continue improving. 2. Cystic Bronchiectasis - follow pulmonary cx's. Continue vanc and cefepime (tx for a total of 8 days; currently day 4). After discussing with pulmonary service and following results from pulmonary cx; will discontinue vanc after 1 more dose. Finish therapy just with cefepime (covering mainly pseudomonas agents) 3. Elevated BNP - due to mild exacerbation of diastolic dysfunction. Now compensated. Will continue use low dose lasix, strict intake and output, follow daily weight and low sodium diet. 4. Sinus tachycardia and HTN - has remained sinus, will avoid albuterol and use xopenex instead. Will coninue and adjust cardizem daily for BP; will also help with tachycardia. 5. Pernicious anemia - continue B12. 6. Cachexia - probably from #1 and 2. Will continue feeding supplements and nutrition service recommendations 7. Dysphagia and aspiration concerns: per SPT will follow regular diet with thin liquids; upright position aduring and after meals; avoid the use of straws  Code Status:Full Family Communication: no family at bedside Disposition Plan: home when medically stable   Consultants:  Pulmonary service  Procedures:  See below for x-ray reports  Antibiotics:  vanc and cefepime  HPI/Subjective: Breathing continue improving. No fever. Patient with small episode of hemoptysis overnight. Also continue to tachycardic at rest into 120's... BP 130-140's systolic   Objective: Filed Vitals:   01/10/14 2136  BP: 134/64  Pulse: 114  Temp: 98.1 F (36.7  C)  Resp: 18    Intake/Output Summary (Last 24 hours) at 01/10/14 1801 Last data filed at 01/10/14 1346  Gross per 24 hour  Intake    720 ml  Output   1751 ml  Net  -1031 ml   Filed Weights   01/08/14 0616 01/09/14 0746 01/10/14 0650  Weight: 52 kg (114 lb 10.2 oz) 50.803 kg (112 lb) 50.803 kg (112 lb)    Exam:   General:  Breathing better, afebrile and in NAD  Cardiovascular: tachycardic; S1 and S2, no rubs or gallops  Respiratory: no wheezing, improved air movement, scattered rhonchi  Abdomen: soft, NT, ND, positive BS  Musculoskeletal: no edema  Data Reviewed: Basic Metabolic Panel:  Recent Labs Lab 01/06/14 1600 01/07/14 0535 01/09/14 0724  NA 141 140 138  K 3.9 5.0 4.0  CL 97 99 98  CO2 36* 34* 34*  GLUCOSE 128* 130* 85  BUN 12 16 17   CREATININE 0.41* 0.44* 0.45*  CALCIUM 9.2 9.0 8.7   CBC:  Recent Labs Lab 01/06/14 1600 01/07/14 0535  WBC 6.9 3.9*  HGB 13.1 12.5  HCT 42.9 41.6  MCV 88.5 88.9  PLT 242 240   Cardiac Enzymes:  Recent Labs Lab 01/06/14 1746  TROPONINI <0.30   BNP (last 3 results)  Recent Labs  01/06/14 1746  PROBNP 1290.0*    Recent Results (from the past 240 hour(s))  CULTURE, EXPECTORATED SPUTUM-ASSESSMENT     Status: None   Collection Time    01/07/14  1:07 AM      Result Value Ref Range Status   Specimen Description SPUTUM   Final   Special Requests Normal   Final   Sputum evaluation     Final   Value: MICROSCOPIC FINDINGS SUGGEST  THAT THIS SPECIMEN IS NOT REPRESENTATIVE OF LOWER RESPIRATORY SECRETIONS. PLEASE RECOLLECT.     RESULT CALLED TO, READ BACK BY AND VERIFIED WITHMaylene Roes RN [phone] GREEN R   Report Status 01/07/2014 FINAL   Final  CULTURE, EXPECTORATED SPUTUM-ASSESSMENT     Status: None   Collection Time    01/07/14  8:39 PM      Result Value Ref Range Status   Specimen Description SPUTUM   Final   Special Requests Normal   Final   Sputum evaluation     Final   Value: MICROSCOPIC  FINDINGS SUGGEST THAT THIS SPECIMEN IS NOT REPRESENTATIVE OF LOWER RESPIRATORY SECRETIONS. PLEASE RECOLLECT.     CALLED TO Redgie Grayer 01/07/14 2121 [facility] M   Report Status 01/07/2014 FINAL   Final  CULTURE, EXPECTORATED SPUTUM-ASSESSMENT     Status: None   Collection Time    01/07/14 11:24 PM      Result Value Ref Range Status   Specimen Description SPUTUM   Final   Special Requests Normal   Final   Sputum evaluation     Final   Value: THIS SPECIMEN IS ACCEPTABLE. RESPIRATORY CULTURE REPORT TO FOLLOW.   Report Status 01/08/2014 FINAL   Final  CULTURE, RESPIRATORY (NON-EXPECTORATED)     Status: None   Collection Time    01/07/14 11:24 PM      Result Value Ref Range Status   Specimen Description TRACHEAL ASPIRATE   Final   Special Requests NONE   Final   Gram Stain     Final   Value: RARE WBC PRESENT, PREDOMINANTLY PMN     NO SQUAMOUS EPITHELIAL CELLS SEEN     NO ORGANISMS SEEN     Performed at [facility]   Culture     Final   Value: Non-Pathogenic Oropharyngeal-type Flora Isolated.     Performed at [facility]   Report Status 01/10/2014 FINAL   Final     Studies: No results found.  Scheduled Meds: . antiseptic oral rinse  15 mL Mouth Rinse BID  . budesonide (PULMICORT) nebulizer solution  0.25 mg Nebulization BID  . ceFEPime (MAXIPIME) IV  2 g Intravenous Q24H  . diltiazem  60 mg Oral Q12H  . enoxaparin (LOVENOX) injection  40 mg Subcutaneous Q24H  . feeding supplement (ENSURE COMPLETE)  237 mL Oral BID BM  . furosemide  20 mg Oral Daily  . ipratropium  0.5 mg Nebulization TID  . levalbuterol  0.63 mg Nebulization TID  . sodium chloride  3 mL Intravenous Q12H  . sodium chloride  3 mL Intravenous Q12H    Time spent: >30 minutes   Vassie Loll  [facility] Pager [phone]. If 7PM-7AM, please contact night-coverage at [email or web address], password [facility] 01/10/2014, 6:01 PM  LOS: 4 days

## 2014-01-10 NOTE — Progress Notes (Signed)
Notified Dr. Claiborne Billings of patient having more frequent pink-tinged sputum considering she has bronchiectasis. Patient showed nurse very small amount of bright red blood, smaller than a dime in the sputum she coughed up. Also notified MD about that and with knowing that, should I give the patient's lovenox injection. Orders given to hold Lovenox for tonight and continue monitoring patient. New insertion of peripheral IV was inserted and patient tolerated it well for it was needed for her IV antibiotics and the previous IV had infiltrated. Currently patient is resting and complains of no pain or discomfort. Will continue to monitor patient to end of shift.

## 2014-01-11 MED ORDER — BOOST / RESOURCE BREEZE PO LIQD
1.0000 | Freq: Three times a day (TID) | ORAL | Status: DC
  Administered 2014-01-11 – 2014-01-15 (×8): 1 via ORAL

## 2014-01-11 MED ORDER — DILTIAZEM HCL ER COATED BEADS 120 MG PO CP24
120.0000 mg | ORAL_CAPSULE | Freq: Every day | ORAL | Status: DC
  Administered 2014-01-11 – 2014-01-14 (×4): 120 mg via ORAL
  Filled 2014-01-11 (×5): qty 1

## 2014-01-11 NOTE — Progress Notes (Signed)
[facility] PROGRESS NOTE  Stacy Novak VVO:[id number] DOB: 02/12/1941 DOA: 01/06/2014 PCP: Rene Paci, MD  Assessment/Plan: 1. 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 Pulmonary service consulted (appreciate rec's and assistance); will continue flutter valve, no further steroids for now. Breathing continue improving. 2. Cystic Bronchiectasis - follow pulmonary cx's. Continue only cefepime now (tx for a total of 8 days; currently day 5). After discussing with pulmonary service and following results from pulmonary cx. Finish therapy just with cefepime (covering mainly pseudomonas agents) 3. Elevated BNP - due to mild exacerbation of diastolic dysfunction. Now compensated. Will discontinue use of low dose lasix for now; patient is not drinking/eating enough and will like to prevent dehydration. She is well compensated and breathing at her best.will check BNP in am, follow weight and I's and O's. 4. Sinus tachycardia and HTN - has remained sinus, will avoid albuterol and use xopenex instead. Will coninue and adjust cardizem daily for BP; will also help with tachycardia. 5. Pernicious anemia - continue B12. 6. Cachexia - probably from #1 and 2. Will continue feeding supplements and nutrition service recommendations 7. Dysphagia and aspiration concerns: per SPT will follow regular diet with thin liquids; upright position aduring and after meals; avoid the use of straws  Code Status:Full Family Communication: no family at bedside Disposition Plan: home when medically stable   Consultants:  Pulmonary service  Procedures:  See below for x-ray reports  Antibiotics:  Cefepime  Received vanc for 4 days.  HPI/Subjective: Breathing at her best, afebrile and in NAD. Patient reports that she is no drinking much and will like not to use more lasix. CHF is well compensated  currently. HR and BP better with cardizem.  Objective: Filed Vitals:   01/11/14 0504  BP: 114/59  Pulse: 95  Temp: 98.1 F (36.7 C)  Resp: 18    Intake/Output Summary (Last 24 hours) at 01/11/14 1345 Last data filed at 01/11/14 0949  Gross per 24 hour  Intake    720 ml  Output    651 ml  Net     69 ml   Filed Weights   01/09/14 0746 01/10/14 0650 01/11/14 0504  Weight: 50.803 kg (112 lb) 50.803 kg (112 lb) 50.576 kg (111 lb 8 oz)    Exam:   General:  Breathing at her best, afebrile and in NAD. Patient reports that she is no drinking much and will like not to use more lasix. CHF is well compensated currently.  Cardiovascular: tachycardic; S1 and S2, no rubs or gallops  Respiratory: no wheezing, improved air movement, scattered rhonchi  Abdomen: soft, NT, ND, positive BS  Musculoskeletal: no edema  Data Reviewed: Basic Metabolic Panel:  Recent Labs Lab 01/06/14 1600 01/07/14 0535 01/09/14 0724  NA 141 140 138  K 3.9 5.0 4.0  CL 97 99 98  CO2 36* 34* 34*  GLUCOSE 128* 130* 85  BUN 12 16 17   CREATININE 0.41* 0.44* 0.45*  CALCIUM 9.2 9.0 8.7   CBC:  Recent Labs Lab 01/06/14 1600 01/07/14 0535  WBC 6.9 3.9*  HGB 13.1 12.5  HCT 42.9 41.6  MCV 88.5 88.9  PLT 242 240   Cardiac Enzymes:  Recent Labs Lab 01/06/14 1746  TROPONINI <0.30   BNP (last 3 results)  Recent Labs  01/06/14 1746  PROBNP 1290.0*    Recent Results (from the past 240 hour(s))  CULTURE, EXPECTORATED SPUTUM-ASSESSMENT  Status: None   Collection Time    01/07/14  1:07 AM      Result Value Ref Range Status   Specimen Description SPUTUM   Final   Special Requests Normal   Final   Sputum evaluation     Final   Value: MICROSCOPIC FINDINGS SUGGEST THAT THIS SPECIMEN IS NOT REPRESENTATIVE OF LOWER RESPIRATORY SECRETIONS. PLEASE RECOLLECT.     RESULT CALLED TO, READ BACK BY AND VERIFIED WITHMaylene Roes: A. BEUENDIA RN [phone] GREEN R   Report Status 01/07/2014 FINAL   Final   CULTURE, EXPECTORATED SPUTUM-ASSESSMENT     Status: None   Collection Time    01/07/14  8:39 PM      Result Value Ref Range Status   Specimen Description SPUTUM   Final   Special Requests Normal   Final   Sputum evaluation     Final   Value: MICROSCOPIC FINDINGS SUGGEST THAT THIS SPECIMEN IS NOT REPRESENTATIVE OF LOWER RESPIRATORY SECRETIONS. PLEASE RECOLLECT.     CALLED TO Redgie GrayerM FUTRELL,RN 01/07/14 [facility]   Report Status 01/07/2014 FINAL   Final  CULTURE, EXPECTORATED SPUTUM-ASSESSMENT     Status: None   Collection Time    01/07/14 11:24 PM      Result Value Ref Range Status   Specimen Description SPUTUM   Final   Special Requests Normal   Final   Sputum evaluation     Final   Value: THIS SPECIMEN IS ACCEPTABLE. RESPIRATORY CULTURE REPORT TO FOLLOW.   Report Status 01/08/2014 FINAL   Final  CULTURE, RESPIRATORY (NON-EXPECTORATED)     Status: None   Collection Time    01/07/14 11:24 PM      Result Value Ref Range Status   Specimen Description TRACHEAL ASPIRATE   Final   Special Requests NONE   Final   Gram Stain     Final   Value: RARE WBC PRESENT, PREDOMINANTLY PMN     NO SQUAMOUS EPITHELIAL CELLS SEEN     NO ORGANISMS SEEN     [facility]   Culture     Final   Value: Non-Pathogenic Oropharyngeal-type Flora Isolated.     [facility] at [facility]   Report Status 01/10/2014 FINAL   Final     Studies: No results found.  Scheduled Meds: . antiseptic oral rinse  15 mL Mouth Rinse BID  . budesonide (PULMICORT) nebulizer solution  0.25 mg Nebulization BID  . ceFEPime (MAXIPIME) IV  2 g Intravenous Q24H  . diltiazem  120 mg Oral Daily  . enoxaparin (LOVENOX) injection  40 mg Subcutaneous Q24H  . feeding supplement (RESOURCE BREEZE)  1 Container Oral TID BM  . ipratropium  0.5 mg Nebulization TID  . levalbuterol  0.63 mg Nebulization TID  . sodium chloride  3 mL Intravenous Q12H  . sodium chloride  3 mL Intravenous Q12H    Time spent:  >30 minutes   Vassie Lollarlos Claudio Mondry  [facility] Pager [phone]. If 7PM-7AM, please contact night-coverage at [email or web address], password [email or web address] 01/11/2014, 1:45 PM  LOS: 5 days

## 2014-01-11 NOTE — Clinical Social Work Psychosocial (Signed)
Clinical Social Work Department BRIEF PSYCHOSOCIAL ASSESSMENT 01/11/2014  Patient:  Stacy Novak, Stacy Novak     Account Number:  [id number]     Admit date:  01/06/2014  Clinical Social Worker:  Hubert Azure  Date/Time:  01/11/2014 01:53 PM  Referred by:  Physician  Date Referred:  01/11/2014 Referred for  Other - See comment   Other Referral:   outpatient needs   Interview type:  Patient Other interview type:    PSYCHOSOCIAL DATA Living Status:  OTHER Admitted from facility:   Level of care:   Primary support name:  Stacy Novak Primary support relationship to patient:  SPOUSE Degree of support available:   Good. Patient's spouse present at bedside.    CURRENT CONCERNS Current Concerns  Financial Resources  Other - See comment   Other Concerns:   Outpatient resources    SOCIAL WORK ASSESSMENT / PLAN CSW met with patient who was alert and oriented at bedside. Patient's spouse and son were present in the room. CSW introduced self and explained role. CSW requested permission to speak with patient regarding current living situation and needs. Patient gave CSW permission to speak with her with son and husband present. Per patient, she, husband, and son have been living in  a [place] of [place]. Patient stated she and husband have a home, but it has become infested with rats and she cannot stand rats. Patient reported they initially left the home because of weather reports and decided to stay in the motel because they observed many rats in the home. Patient reported family intends to continue staying at the motel until the rat infestation is taken care of. Per patient, she does not have any needs. Patient stated she has her oxygen and takes her medication. Patient reported where she lives there is enough space and plenty of "ventilation" in regard to her medical complexities. Patient declined any services by CSW at this time. Patient stated she  does not "need anything right now." No further needs. CSW signing off.    Assessment/plan status:  No Further Intervention Required Other assessment/ plan:   Information/referral to community resources:    PATIENT'S/FAMILY'S RESPONSE TO PLAN OF CARE: Patient thanked CSW for taking the time to make sure she was "going back to a safe place."   [employer], [employer] Weekend Clinical Social Worker [phone]

## 2014-01-12 LAB — BASIC METABOLIC PANEL
BUN: 17 mg/dL (ref 6–23)
CHLORIDE: 100 meq/L (ref 96–112)
CO2: 34 mEq/L — ABNORMAL HIGH (ref 19–32)
Calcium: 8.6 mg/dL (ref 8.4–10.5)
Creatinine, Ser: 0.36 mg/dL — ABNORMAL LOW (ref 0.50–1.10)
GFR calc non Af Amer: >90 mL/min (ref >90)
Glucose, Bld: 90 mg/dL (ref 70–99)
POTASSIUM: 4.1 meq/L (ref 3.7–5.3)
Sodium: 138 mEq/L (ref 137–147)

## 2014-01-12 LAB — VITAMIN B12: Vitamin B-12: 692 pg/mL (ref 211–911)

## 2014-01-12 LAB — PRO B NATRIURETIC PEPTIDE: PRO B NATRI PEPTIDE: 528.2 pg/mL — AB (ref 0–125)

## 2014-01-12 NOTE — Progress Notes (Signed)
[facility] PROGRESS NOTE  Stacy Novak Stacy Novak ZOX:[id number] DOB: 01/29/1941 DOA: 01/06/2014 PCP: Stacy PaciValerie Leschber, MD  Assessment/Plan: 1. 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 Pulmonary service consulted (appreciate rec's and assistance); will continue flutter valve, no further steroids for now. Breathing continue improving. 2. Cystic Bronchiectasis - follow pulmonary cx's. Continue only cefepime now (tx for a total of 8 days; currently day 6). After discussing with pulmonary service and following results from pulmonary cx. Finish therapy just with cefepime (covering mainly pseudomonas agents) 3. Elevated BNP - due to mild exacerbation of diastolic dysfunction. Now compensated. Will discontinue use of low dose lasix for now; patient is not drinking/eating enough and will like to prevent dehydration. She is well compensated and breathing at her best. BNP is now in the 500 range; follow weight and I's and O's and daily weights 4. Sinus tachycardia and HTN - has remained sinus, will avoid albuterol and use xopenex instead. Will coninue and adjust cardizem daily for BP; will also help with tachycardia. 5. Pernicious anemia - continue B12. 6. Cachexia - probably from #1 and 2. Will continue feeding supplements and nutrition service recommendations 7. Dysphagia and aspiration concerns: per SPT will follow regular diet with thin liquids; upright position aduring and after meals; avoid the use of straws  Code Status:Full Family Communication: no family at bedside Disposition Plan: home when medically stable   Consultants:  Pulmonary service  Procedures:  See below for x-ray reports  Antibiotics:  Cefepime  Received vanc for 4 days.  HPI/Subjective: Breathing at her best, afebrile and in NAD. HR and BP much better with cardizem.  Objective: Filed Vitals:   01/12/14 1105   BP:   Pulse: 92  Temp: 97.7 F (36.5 C)  Resp: 20    Intake/Output Summary (Last 24 hours) at 01/12/14 1349 Last data filed at 01/12/14 1242  Gross per 24 hour  Intake    910 ml  Output   2152 ml  Net  -1242 ml   Filed Weights   01/10/14 0650 01/11/14 0504 01/12/14 0648  Weight: 50.803 kg (112 lb) 50.576 kg (111 lb 8 oz) 51.1 kg (112 lb 10.5 oz)    Exam:   General:  Breathing at her best, afebrile and in NAD. Patient reports that she is no drinking much and will like not to use more lasix. CHF is well compensated currently.  Cardiovascular: tachycardic; S1 and S2, no rubs or gallops  Respiratory: no wheezing, improved air movement, scattered rhonchi  Abdomen: soft, NT, ND, positive BS  Musculoskeletal: no edema  Data Reviewed: Basic Metabolic Panel:  Recent Labs Lab 01/06/14 1600 01/07/14 0535 01/09/14 0724 01/12/14 0525  NA 141 140 138 138  K 3.9 5.0 4.0 4.1  CL 97 99 98 100  CO2 36* 34* 34* 34*  GLUCOSE 128* 130* 85 90  BUN 12 16 17 17   CREATININE 0.41* 0.44* 0.45* 0.36*  CALCIUM 9.2 9.0 8.7 8.6   CBC:  Recent Labs Lab 01/06/14 1600 01/07/14 0535  WBC 6.9 3.9*  HGB 13.1 12.5  HCT 42.9 41.6  MCV 88.5 88.9  PLT 242 240   Cardiac Enzymes:  Recent Labs Lab 01/06/14 1746  TROPONINI <0.30   BNP (last 3 results)  Recent Labs  01/06/14 1746 01/12/14 0525  PROBNP 1290.0* 528.2*    Recent Results (from the past 240 hour(s))  CULTURE, EXPECTORATED SPUTUM-ASSESSMENT  Status: None   Collection Time    01/07/14  1:07 AM      Result Value Ref Range Status   Specimen Description SPUTUM   Final   Special Requests Normal   Final   Sputum evaluation     Final   Value: MICROSCOPIC FINDINGS SUGGEST THAT THIS SPECIMEN IS NOT REPRESENTATIVE OF LOWER RESPIRATORY SECRETIONS. PLEASE RECOLLECT.     RESULT CALLED TO, READ BACK BY AND VERIFIED WITHMaylene Roes RN [phone] GREEN R   Report Status 01/07/2014 FINAL   Final  CULTURE, EXPECTORATED  SPUTUM-ASSESSMENT     Status: None   Collection Time    01/07/14  8:39 PM      Result Value Ref Range Status   Specimen Description SPUTUM   Final   Special Requests Normal   Final   Sputum evaluation     Final   Value: MICROSCOPIC FINDINGS SUGGEST THAT THIS SPECIMEN IS NOT REPRESENTATIVE OF LOWER RESPIRATORY SECRETIONS. PLEASE RECOLLECT.     CALLED TO Redgie Grayer 01/07/14 2121 [facility] M   Report Status 01/07/2014 FINAL   Final  CULTURE, EXPECTORATED SPUTUM-ASSESSMENT     Status: None   Collection Time    01/07/14 11:24 PM      Result Value Ref Range Status   Specimen Description SPUTUM   Final   Special Requests Normal   Final   Sputum evaluation     Final   Value: THIS SPECIMEN IS ACCEPTABLE. RESPIRATORY CULTURE REPORT TO FOLLOW.   Report Status 01/08/2014 FINAL   Final  CULTURE, RESPIRATORY (NON-EXPECTORATED)     Status: None   Collection Time    01/07/14 11:24 PM      Result Value Ref Range Status   Specimen Description TRACHEAL ASPIRATE   Final   Special Requests NONE   Final   Gram Stain     Final   Value: RARE WBC PRESENT, PREDOMINANTLY PMN     NO SQUAMOUS EPITHELIAL CELLS SEEN     NO ORGANISMS SEEN     Performed at [facility]   Culture     Final   Value: Non-Pathogenic Oropharyngeal-type Flora Isolated.     Performed at [facility]   Report Status 01/10/2014 FINAL   Final     Studies: No results found.  Scheduled Meds: . antiseptic oral rinse  15 mL Mouth Rinse BID  . budesonide (PULMICORT) nebulizer solution  0.25 mg Nebulization BID  . ceFEPime (MAXIPIME) IV  2 g Intravenous Q24H  . diltiazem  120 mg Oral Daily  . enoxaparin (LOVENOX) injection  40 mg Subcutaneous Q24H  . feeding supplement (RESOURCE BREEZE)  1 Container Oral TID BM  . ipratropium  0.5 mg Nebulization TID  . levalbuterol  0.63 mg Nebulization TID  . sodium chloride  3 mL Intravenous Q12H  . sodium chloride  3 mL Intravenous Q12H    Time spent: >30 minutes   Vassie Loll  [facility] Pager [phone]. If 7PM-7AM, please contact night-coverage at [email or web address], password [facility] 01/12/2014, 1:49 PM  LOS: 6 days

## 2014-01-12 NOTE — Progress Notes (Signed)
ANTIBIOTIC CONSULT NOTE  - Follow-Up Pharmacy Consult for Cefepime Indication: bronchiectasis  Allergies  Allergen Reactions  . Daliresp [Roflumilast] Other (See Comments)    Mental changes, feeling weird  . Doxycycline Other (See Comments)    Bad taste in mouth, "salty coating"  . Levaquin [Levofloxacin] Palpitations and Other (See Comments)    Joint pains/aches  . Other Other (See Comments)    DAIRY PRODUCTS-causes thick mucous secretions  . Sulfonamide Derivatives Nausea Only    Patient Measurements: Height: 5' 5.5" (166.4 cm) Weight: 112 lb 10.5 oz (51.1 kg) IBW/kg (Calculated) : 58.15   Vital Signs: Temp: 97.9 F (36.6 C) (05/25 1453) Temp src: Oral (05/25 1453) BP: 124/60 mmHg (05/25 1453) Pulse Rate: 104 (05/25 1453) Intake/Output from previous day: 05/24 0701 - 05/25 0700 In: 800 [P.O.:600; IV Piggyback:200] Out: 1302 [Urine:1300; Stool:2] Intake/Output from this shift: Total I/O In: 590 [P.O.:590] Out: 1051 [Urine:1050; Stool:1]  Labs:  Recent Labs  01/12/14 0525  CREATININE 0.36*   Estimated Creatinine Clearance: 49.8 ml/min (by C-G formula based on Cr of 0.36). No results found for this basename: VANCOTROUGH, Leodis BinetVANCOPEAK, VANCORANDOM, GENTTROUGH, GENTPEAK, GENTRANDOM, TOBRATROUGH, TOBRAPEAK, TOBRARND, AMIKACINPEAK, AMIKACINTROU, AMIKACIN,  in the last 72 hours   Microbiology: Recent Results (from the past 720 hour(s))  CULTURE, EXPECTORATED SPUTUM-ASSESSMENT     Status: None   Collection Time    01/07/14  1:07 AM      Result Value Ref Range Status   Specimen Description SPUTUM   Final   Special Requests Normal   Final   Sputum evaluation     Final   Value: MICROSCOPIC FINDINGS SUGGEST THAT THIS SPECIMEN IS NOT REPRESENTATIVE OF LOWER RESPIRATORY SECRETIONS. PLEASE RECOLLECT.     RESULT CALLED TO, READ BACK BY AND VERIFIED WITHMaylene Roes: A. BEUENDIA RN [phone] GREEN R   Report Status 01/07/2014 FINAL   Final  CULTURE, EXPECTORATED SPUTUM-ASSESSMENT      Status: None   Collection Time    01/07/14  8:39 PM      Result Value Ref Range Status   Specimen Description SPUTUM   Final   Special Requests Normal   Final   Sputum evaluation     Final   Value: MICROSCOPIC FINDINGS SUGGEST THAT THIS SPECIMEN IS NOT REPRESENTATIVE OF LOWER RESPIRATORY SECRETIONS. PLEASE RECOLLECT.     CALLED TO Redgie GrayerM FUTRELL,RN 01/07/14 [facility]   Report Status 01/07/2014 FINAL   Final  CULTURE, EXPECTORATED SPUTUM-ASSESSMENT     Status: None   Collection Time    01/07/14 11:24 PM      Result Value Ref Range Status   Specimen Description SPUTUM   Final   Special Requests Normal   Final   Sputum evaluation     Final   Value: THIS SPECIMEN IS ACCEPTABLE. RESPIRATORY CULTURE REPORT TO FOLLOW.   Report Status 01/08/2014 FINAL   Final  CULTURE, RESPIRATORY (NON-EXPECTORATED)     Status: None   Collection Time    01/07/14 11:24 PM      Result Value Ref Range Status   Specimen Description TRACHEAL ASPIRATE   Final   Special Requests NONE   Final   Gram Stain     Final   Value: RARE WBC PRESENT, PREDOMINANTLY PMN     NO SQUAMOUS EPITHELIAL CELLS SEEN     NO ORGANISMS SEEN     [facility]   Culture     Final   Value: Non-Pathogenic Oropharyngeal-type Flora Isolated.  Performed at [facility]   Report Status 01/10/2014 FINAL   Final    Assessment: 73yo female with worsening SOB, started on Vancomycin and Cefepime for bronchiectasis on the evening of 01/06/14. Vancomycin was discontinued but pulmonary recommended cefepime to continue for total of 8 days (day#7). Renal function is stable. WBC 3.9 on 5/20. Afebrile. Cultures negative.  Goal of Therapy:  Eradication of infection  Plan:  - Continue Cefepime 2gm IV q24 - Monitor renal function, cultures, WBC, and Tmax  Andee Chivers A. Lenon Ahmadi, PharmD Clinical Pharmacist - Resident Pager: [phone] Pharmacy: [phone] 01/12/2014 3:24 PM

## 2014-01-13 DIAGNOSIS — E43 Unspecified severe protein-calorie malnutrition: Secondary | ICD-10-CM

## 2014-01-13 LAB — CREATININE, SERUM
CREATININE: 0.38 mg/dL — AB (ref 0.50–1.10)
GFR calc Af Amer: >90 mL/min (ref >90)

## 2014-01-13 NOTE — Progress Notes (Signed)
UR completed Danyale Ridinger K. Zamirah Denny, RN, BSN, MSHL, CCM  01/13/2014 6:26 PM

## 2014-01-13 NOTE — Progress Notes (Signed)
[facility] PROGRESS NOTE  Stacy Novak WGN:[id number] DOB: 1941/04/09 DOA: 01/06/2014 PCP: Rene Paci, MD  Interim discharge summary 73 y.o. female with history of bronchiectasis and COPD and pernicious anemia presents to the ER because of worsening shortness of breath. Patient is on chronic home oxygen 3 L. Patient had gone to her pulmonologist 4 days ago and was prescribed doxycycline for increasing shortness of breath with productive cough. Despite taking which patient's shortness of breath worsened. In the ER patient was found to have wheezing and was placed on nebulizer and steroids. Chest x-ray shows changes consistent with bronchiectasis with air-fluid levels and vascular congestion. BNP was also elevated. Patient denied any chest pain, nausea, vomiting, abdominal pain or diarrhea on admission. Follow recommendations from pulmonary service the plan is to treat with cefepime to cover Pseudomonas microorganism for a total of 8 days; patient is currently on day 7 of treatment. Patient diastolic heart failure is now compensated after good diuresis during admission.   Assessment/Plan: 1. Acute on chronic resp failure due to bronchiectasis with exacerbation - patient will be continue on PRN nebulizer and Pulmicort and antibiotics (cefepime). Pulmonary service consulted (appreciate rec's and assistance); will continue flutter valve, no further steroids for now. Breathing continue improving. 2. Cystic Bronchiectasis - After discussing with pulmonary service and following results from pulmonary cx; plan is to finish antibiotic therapy just with cefepime (covering mainly pseudomonas agents). Patient is currently on day 7/8 3. Elevated BNP - due to mild exacerbation of diastolic dysfunction: Now compensated. Will discontinue use of low dose lasix for now; patient is not drinking/eating enough and will like to prevent dehydration and acute renal failure. She is well compensated and breathing at  her best. BNP is now in the 500 range; follow weight and I's and O's and low sodium diet  4. Sinus tachycardia and HTN - has remained sinus, will avoid albuterol and use xopenex instead. Will continue cardizem daily for BP and heart rate control; adjust as needed. 5. Pernicious anemia - continue B12. B12 level 692 6.  severe protein calorie malnutrition/failure to thrive/Cachexia - probably from #1 and 2. Will continue feeding supplements and  will follow any other nutrition service recommendations 7. Dysphagia and aspiration concerns: per SPT  evaluation and following MBS results, will follow regular diet with thin liquids; upright position during and after meals; avoid the use of straws.  DVT: SCDs.   Code Status:Full Family Communication: no family at bedside Disposition Plan: home when medically stable   Consultants:  Pulmonary service  Procedures:  See below for x-ray reports  Antibiotics:  Cefepime  Received vanc for 4 days.  HPI/Subjective: Breathing at her best, afebrile and in NAD. Denies chest pain. Heart rate and blood pressure remained stable. No weight changes in the last 3 days.  Objective: Filed Vitals:   01/13/14 1522  BP: 129/68  Pulse: 103  Temp: 98.3 F (36.8 C)  Resp: 18    Intake/Output Summary (Last 24 hours) at 01/13/14 1606 Last data filed at 01/13/14 1500  Gross per 24 hour  Intake    320 ml  Output   1600 ml  Net  -1280 ml   Filed Weights   01/11/14 0504 01/12/14 0648 01/13/14 0700  Weight: 50.576 kg (111 lb 8 oz) 51.1 kg (112 lb 10.5 oz) 50.712 kg (111 lb 12.8 oz)    Exam:   General:  Breathing at her best, afebrile and in NAD. Denies chest pain.   Cardiovascular: tachycardic; S1 and  S2, no rubs or gallops  Respiratory: no wheezing, improved air movement, scattered rhonchi  Abdomen: soft, NT, ND, positive BS  Musculoskeletal: no edema  Data Reviewed: Basic Metabolic Panel:  Recent Labs Lab 01/07/14 0535 01/09/14 0724  01/12/14 0525 01/13/14 0455  NA 140 138 138  --   K 5.0 4.0 4.1  --   CL 99 98 100  --   CO2 34* 34* 34*  --   GLUCOSE 130* 85 90  --   BUN 16 17 17   --   CREATININE 0.44* 0.45* 0.36* 0.38*  CALCIUM 9.0 8.7 8.6  --    CBC:  Recent Labs Lab 01/07/14 0535  WBC 3.9*  HGB 12.5  HCT 41.6  MCV 88.9  PLT 240   Cardiac Enzymes:  Recent Labs Lab 01/06/14 1746  TROPONINI <0.30   BNP (last 3 results)  Recent Labs  01/06/14 1746 01/12/14 0525  PROBNP 1290.0* 528.2*    Recent Results (from the past 240 hour(s))  CULTURE, EXPECTORATED SPUTUM-ASSESSMENT     Status: None   Collection Time    01/07/14  1:07 AM      Result Value Ref Range Status   Specimen Description SPUTUM   Final   Special Requests Normal   Final   Sputum evaluation     Final   Value: MICROSCOPIC FINDINGS SUGGEST THAT THIS SPECIMEN IS NOT REPRESENTATIVE OF LOWER RESPIRATORY SECRETIONS. PLEASE RECOLLECT.     RESULT CALLED TO, READ BACK BY AND VERIFIED WITHMaylene Roes: A. BEUENDIA RN [phone] GREEN R   Report Status 01/07/2014 FINAL   Final  CULTURE, EXPECTORATED SPUTUM-ASSESSMENT     Status: None   Collection Time    01/07/14  8:39 PM      Result Value Ref Range Status   Specimen Description SPUTUM   Final   Special Requests Normal   Final   Sputum evaluation     Final   Value: MICROSCOPIC FINDINGS SUGGEST THAT THIS SPECIMEN IS NOT REPRESENTATIVE OF LOWER RESPIRATORY SECRETIONS. PLEASE RECOLLECT.     CALLED TO Redgie GrayerM FUTRELL,RN 01/07/14 [facility]   Report Status 01/07/2014 FINAL   Final  CULTURE, EXPECTORATED SPUTUM-ASSESSMENT     Status: None   Collection Time    01/07/14 11:24 PM      Result Value Ref Range Status   Specimen Description SPUTUM   Final   Special Requests Normal   Final   Sputum evaluation     Final   Value: THIS SPECIMEN IS ACCEPTABLE. RESPIRATORY CULTURE REPORT TO FOLLOW.   Report Status 01/08/2014 FINAL   Final  CULTURE, RESPIRATORY (NON-EXPECTORATED)     Status: None   Collection  Time    01/07/14 11:24 PM      Result Value Ref Range Status   Specimen Description TRACHEAL ASPIRATE   Final   Special Requests NONE   Final   Gram Stain     Final   Value: RARE WBC PRESENT, PREDOMINANTLY PMN     NO SQUAMOUS EPITHELIAL CELLS SEEN     NO ORGANISMS SEEN     [facility]   Culture     Final   Value: Non-Pathogenic Oropharyngeal-type Flora Isolated.     [facility]formed at [facility]   Report Status 01/10/2014 FINAL   Final     Studies: No results found.  Scheduled Meds: . antiseptic oral rinse  15 mL Mouth Rinse BID  . budesonide (PULMICORT) nebulizer solution  0.25 mg Nebulization BID  .  ceFEPime (MAXIPIME) IV  2 g Intravenous Q24H  . diltiazem  120 mg Oral Daily  . enoxaparin (LOVENOX) injection  40 mg Subcutaneous Q24H  . feeding supplement (RESOURCE BREEZE)  1 Container Oral TID BM  . ipratropium  0.5 mg Nebulization TID  . levalbuterol  0.63 mg Nebulization TID  . sodium chloride  3 mL Intravenous Q12H  . sodium chloride  3 mL Intravenous Q12H    Time spent: >30 minutes   Vassie Loll  [facility] Pager [phone]. If 7PM-7AM, please contact night-coverage at [email or web address], password [facility] 01/13/2014, 4:06 PM  LOS: 7 days

## 2014-01-13 NOTE — Plan of Care (Signed)
Problem: ICU Phase Progression Outcomes Goal: O2 sats trending toward baseline Outcome: Adequate for Discharge Oxygen saturation 94-96% on 2l Stacy Novak

## 2014-01-14 MED ORDER — DILTIAZEM HCL ER COATED BEADS 120 MG PO CP24: 120.0000 mg | ORAL_CAPSULE | Freq: Every day | ORAL | Status: DC

## 2014-01-14 NOTE — Progress Notes (Signed)
Noted pt has d/c orders early this afternoon.  Pt informed nurse that MD stated that she can go home tomorrow after finish ABX.  Dr. Rulon Eisenmenger Robb Matar notified and instructed that pt not for d/c until tomorrow 01/05/14.  Amanda Pea, Charity fundraiser.

## 2014-01-14 NOTE — Progress Notes (Signed)
[facility] PROGRESS NOTE  Stacy Novak ZOX:[id number] DOB: 04/17/1941 DOA: 01/06/2014 PCP: Rene PaciValerie Leschber, MD  Interim discharge summary 73 y.o. female with history of bronchiectasis and COPD and pernicious anemia presents to the ER because of worsening shortness of breath. Patient is on chronic home oxygen 3 L. Patient had gone to her pulmonologist 4 days ago and was prescribed doxycycline for increasing shortness of breath with productive cough. Despite taking which patient's shortness of breath worsened. In the ER patient was found to have wheezing and was placed on nebulizer and steroids. Chest x-ray shows changes consistent with bronchiectasis with air-fluid levels and vascular congestion. BNP was also elevated. Patient denied any chest pain, nausea, vomiting, abdominal pain or diarrhea on admission. Follow recommendations from pulmonary service the plan is to treat with cefepime to cover Pseudomonas microorganism for a total of 8 days; patient is currently on day 7 of treatment. Patient diastolic heart failure is now compensated after good diuresis during admission.   Assessment/Plan: Acute on chronic resp failure due to bronchiectasis with exacerbation: - Patient will be continue on PRN nebulizer and Pulmicort and antibiotics (cefepime completed course on 5.27.2015)on consulted (appreciate rec's and assistance). - No further steroids for now. Breathing continue improving.  Cystic Bronchiectasis: - After discussing with pulmonary service and following results from pulmonary cx; Patient has completed treatment.  Elevated BNP : - due to mild exacerbation of diastolic dysfunction: Now compensated. Will discontinue use of low dose lasix for now; patient is not drinking/eating enough and will like to prevent dehydration and acute renal failure. She is well compensated and breathing at her best. BNP is now in the 500 range; follow weight and I's and O's and low sodium diet   Sinus  tachycardia and HTN  - has remained sinus, will avoid albuterol and use xopenex instead. Will continue cardizem daily for BP and heart rate control; adjust as needed.  Pernicious anemia - continue B12. B12 level 692  severe protein calorie malnutrition/failure to thrive/Cachexia - probably from #1 and 2. Will continue feeding supplements and  will follow any other nutrition service recommendations  Dyphagia and aspiration concerns:  - per SPT  evaluation and following MBS results, will follow regular diet with thin liquids; upright position during and after meals; avoid the use of straws.  DVT: SCDs.   Code Status:Full Family Communication: no family at bedside Disposition Plan: home when medically stable   Consultants:  Pulmonary service  Procedures:  See below for x-ray reports  Antibiotics:  Cefepime  Received vanc for 4 days.  HPI/Subjective: Breathing at baseline.  Objective: Filed Vitals:   01/14/14 0948  BP:   Pulse: 90  Temp:   Resp: 20    Intake/Output Summary (Last 24 hours) at 01/14/14 1026 Last data filed at 01/14/14 [id number]  Gross per 24 hour  Intake    400 ml  Output   1650 ml  Net  -1250 ml   Filed Weights   01/12/14 0648 01/13/14 0700 01/14/14 0624  Weight: 51.1 kg (112 lb 10.5 oz) 50.712 kg (111 lb 12.8 oz) 50.667 kg (111 lb 11.2 oz)    Exam:   General:  Breathing at her best, afebrile and in NAD. Denies chest pain.   Cardiovascular: tachycardic; S1 and S2, no rubs or gallops  Respiratory: no wheezing, improved air movement, scattered rhonchi  Abdomen: soft, NT, ND, positive BS  Musculoskeletal: no edema  Data Reviewed: Basic Metabolic Panel:  Recent Labs Lab 01/09/14 0724 01/12/14 0525 01/13/14  0455  NA 138 138  --   K 4.0 4.1  --   CL 98 100  --   CO2 34* 34*  --   GLUCOSE 85 90  --   BUN 17 17  --   CREATININE 0.45* 0.36* 0.38*  CALCIUM 8.7 8.6  --    CBC: No results found for this basename: WBC, NEUTROABS, HGB,  HCT, MCV, PLT,  in the last 168 hours Cardiac Enzymes: No results found for this basename: CKTOTAL, CKMB, CKMBINDEX, TROPONINI,  in the last 168 hours BNP (last 3 results)  Recent Labs  01/06/14 1746 01/12/14 0525  PROBNP 1290.0* 528.2*    Recent Results (from the past 240 hour(s))  CULTURE, EXPECTORATED SPUTUM-ASSESSMENT     Status: None   Collection Time    01/07/14  1:07 AM      Result Value Ref Range Status   Specimen Description SPUTUM   Final   Special Requests Normal   Final   Sputum evaluation     Final   Value: MICROSCOPIC FINDINGS SUGGEST THAT THIS SPECIMEN IS NOT REPRESENTATIVE OF LOWER RESPIRATORY SECRETIONS. PLEASE RECOLLECT.     RESULT CALLED TO, READ BACK BY AND VERIFIED WITHMaylene Roes RN [phone] GREEN R   Report Status 01/07/2014 FINAL   Final  CULTURE, EXPECTORATED SPUTUM-ASSESSMENT     Status: None   Collection Time    01/07/14  8:39 PM      Result Value Ref Range Status   Specimen Description SPUTUM   Final   Special Requests Normal   Final   Sputum evaluation     Final   Value: MICROSCOPIC FINDINGS SUGGEST THAT THIS SPECIMEN IS NOT REPRESENTATIVE OF LOWER RESPIRATORY SECRETIONS. PLEASE RECOLLECT.     CALLED TO Redgie Grayer 01/07/14 2121 [facility] M   Report Status 01/07/2014 FINAL   Final  CULTURE, EXPECTORATED SPUTUM-ASSESSMENT     Status: None   Collection Time    01/07/14 11:24 PM      Result Value Ref Range Status   Specimen Description SPUTUM   Final   Special Requests Normal   Final   Sputum evaluation     Final   Value: THIS SPECIMEN IS ACCEPTABLE. RESPIRATORY CULTURE REPORT TO FOLLOW.   Report Status 01/08/2014 FINAL   Final  CULTURE, RESPIRATORY (NON-EXPECTORATED)     Status: None   Collection Time    01/07/14 11:24 PM      Result Value Ref Range Status   Specimen Description TRACHEAL ASPIRATE   Final   Special Requests NONE   Final   Gram Stain     Final   Value: RARE WBC PRESENT, PREDOMINANTLY PMN     NO SQUAMOUS EPITHELIAL CELLS  SEEN     NO ORGANISMS SEEN     Performed at [facility]   Culture     Final   Value: Non-Pathogenic Oropharyngeal-type Flora Isolated.     Performed at [facility]   Report Status 01/10/2014 FINAL   Final     Studies: No results found.  Scheduled Meds: . antiseptic oral rinse  15 mL Mouth Rinse BID  . budesonide (PULMICORT) nebulizer solution  0.25 mg Nebulization BID  . ceFEPime (MAXIPIME) IV  2 g Intravenous Q24H  . diltiazem  120 mg Oral Daily  . enoxaparin (LOVENOX) injection  40 mg Subcutaneous Q24H  . feeding supplement (RESOURCE BREEZE)  1 Container Oral TID BM  . ipratropium  0.5 mg Nebulization TID  .  levalbuterol  0.63 mg Nebulization TID  . sodium chloride  3 mL Intravenous Q12H  . sodium chloride  3 mL Intravenous Q12H    Time spent: >30 minutes   Marinda Elk  [facility] Pager [phone]. If 7PM-7AM, please contact night-coverage at [email or web address], password [facility] 01/14/2014, 10:26 AM  LOS: 8 days

## 2014-01-14 NOTE — Discharge Summary (Addendum)
Physician Discharge Summary  Stacy Novak ZOX:[id number] DOB: 02/26/1941 DOA: 01/06/2014  PCP: Rene PaciValerie Leschber, MD  Admit date: 01/06/2014 Discharge date: 01/15/2014  Time spent: 35 minutes  Recommendations for Outpatient Follow-up:  1. follow up with pulmonary in 1 week. Assess her oxygen saturation needs. 2. Will have to discuss code status with PCP as her lung function cont to deteriorate.  Discharge Diagnoses:  Principal Problem:   Acute on chronic respiratory failure Active Problems:   Bronchiectasis without acute exacerbation   B12 deficiency anemia   COPD exacerbation   Discharge Condition: Guarded  Diet recommendation: regular diet with thin liquids; upright position during and after meals; avoid the use of straws.   Filed Weights   01/13/14 0700 01/14/14 0624 01/15/14 0515  Weight: 50.712 kg (111 lb 12.8 oz) 50.667 kg (111 lb 11.2 oz) 50.758 kg (111 lb 14.4 oz)    History of present illness:  73 y.o. female with history of bronchiectasis and COPD and pernicious anemia presents to the ER because of worsening shortness of breath. Patient is on chronic home oxygen 3 L. Patient had gone to her pulmonologist 4 days ago and was prescribed doxycycline for increasing shortness of breath with productive cough. Despite taking which patient's shortness of breath worsened. In the ER patient was found to have wheezing and was placed on nebulizer and steroids. Chest x-ray shows changes consistent with bronchiectasis with air-fluid levels which are old. Patient denies any chest pain nausea vomiting abdominal pain or diarrhea.    Hospital Course:  Acute on chronic resp failure due to bronchiectasis with exacerbation:  - Patient will be continue on PRN nebulizer and Pulmicort and antibiotics (course in house ). - Appreciate pulmonary rec's and assistance.  - No further steroids for now. Breathing continue improving.  - Cont oxygen at home. - Will cont xopenex and Atrovent as needed as  an outpatient. -   Cystic Bronchiectasis:  - consulted pulmonary - After discussing with pulmonary service they recommended to complete 8 days of cefepime to cover for pseudomonas as cultures were negative.; completed treatment with IV cefepime.  Elevated BNP :  - Due to mild exacerbation of diastolic dysfunction: Now compensated.  - Discontinue use of low dose lasix for now; patient is not drinking/eating enough and will like to prevent dehydration and acute renal failure. She is well compensated and breathing at her best. BNP is now in the 500 range; - Follow weight as outpatient.  Sinus tachycardia and HTN  - Has remained sinus, will avoid albuterol in future. - Will continue cardizem daily for BP and heart rate control; adjust as needed.   Pernicious anemia: - continue B12. B12 level 692.  Severe protein calorie malnutrition/failure to thrive/Cachexia  - probably from #1 and 2. Will continue feeding supplements and will follow any other nutrition service recommendations.  Dyphagia and aspiration concerns:  - per SPT evaluation and following MBS results, will follow regular diet with thin liquids; upright position during and after meals; avoid the use of straws.   Procedures:  *CXR as below  Consultations:  none  Discharge Exam: Filed Vitals:   01/15/14 0515  BP: 111/70  Pulse: 97  Temp: 97.9 F (36.6 C)  Resp: 20    General: A&O x3 Cardiovascular: RRR Respiratory: good air movement CTA B/L  Discharge Instructions You were cared for by a hospitalist during your hospital stay. If you have any questions about your discharge medications or the care you received while you were in the  hospital after you are discharged, you can call the unit and asked to speak with the hospitalist on call if the hospitalist that took care of you is not available. Once you are discharged, your primary care physician will handle any further medical issues. Please note that NO REFILLS for  any discharge medications will be authorized once you are discharged, as it is imperative that you return to your primary care physician (or establish a relationship with a primary care physician if you do not have one) for your aftercare needs so that they can reassess your need for medications and monitor your lab values.      Discharge Instructions   Diet - low sodium heart healthy    Complete by:  As directed      Increase activity slowly    Complete by:  As directed             Medication List    STOP taking these medications       doxycycline 100 MG capsule  Commonly known as:  VIBRAMYCIN      TAKE these medications       calcium carbonate 500 MG chewable tablet  Commonly known as:  TUMS - dosed in mg elemental calcium  Chew 1 tablet by mouth daily as needed for indigestion or heartburn.     cyanocobalamin 1000 MCG/ML injection  Commonly known as:  (VITAMIN B-12)  Inject 1 mL (1,000 mcg total) into the muscle every 30 (thirty) days.     diltiazem 120 MG 24 hr capsule  Commonly known as:  CARDIZEM CD  Take 1 capsule (120 mg total) by mouth daily.     ipratropium 0.02 % nebulizer solution  Commonly known as:  ATROVENT  Take 2.5 mLs (0.5 mg total) by nebulization 3 (three) times daily.     levalbuterol 0.63 MG/3ML nebulizer solution  Commonly known as:  XOPENEX  Take 3 mLs (0.63 mg total) by nebulization every 3 (three) hours as needed for wheezing or shortness of breath.     SYSTANE 0.4-0.3 % Soln  Generic drug:  Polyethyl Glycol-Propyl Glycol  Apply 1 drop to eye daily as needed (for dry eyes).       Allergies  Allergen Reactions  . Daliresp [Roflumilast] Other (See Comments)    Mental changes, feeling weird  . Doxycycline Other (See Comments)    Bad taste in mouth, "salty coating"  . Levaquin [Levofloxacin] Palpitations and Other (See Comments)    Joint pains/aches  . Other Other (See Comments)    DAIRY PRODUCTS-causes thick mucous secretions  .  Sulfonamide Derivatives Nausea Only   Follow-up Information   Follow up with Waymon Budge, MD On 01/21/2014. (hospital follow up @10 :00am with Tammy Parrett spoke with  Chantel )    Specialty:  Pulmonary Disease   Contact information:   [address]  [facility] [place] [address] [phone]       Follow up with Rene Paci, MD On 01/30/2014. (hospital follow up @9 :00 am spoke with Harriett Sine )    Specialty:  Internal Medicine   Contact information:   [address] [place] [address] [phone]        The results of significant diagnostics from this hospitalization (including imaging, microbiology, ancillary and laboratory) are listed below for reference.    Significant Diagnostic Studies: Dg Chest 2 View  01/02/2014   CLINICAL DATA:  Bronchiectasis  EXAM: CHEST  2 VIEW  COMPARISON:  02/28/2013  FINDINGS: Cardiomediastinal silhouette is stable. Again noted bilateral patchy parenchymal opacification with bronchiectasis and multiple cystic changes with air-fluid levels.  IMPRESSION: Again noted patchy opacification of the lung parenchyma bilaterally with bronchiectasis and multiple cystic changes with air-fluid levels without significant change from prior exam. No definite superimposed confluent segmental infiltrate. No pulmonary edema.   Electronically Signed   By: Natasha Mead M.D.   On: 01/02/2014 16:21   Dg Chest Portable 1 View  01/06/2014   CLINICAL DATA:  Shortness of breath for 5 days, history asthma, hypertension, bronchiectasis, COPD, mycobacterial disease  EXAM: PORTABLE CHEST - 1 VIEW  COMPARISON:  Portable exam 1711 hr compared to 01/02/2014  Correlation:  CT chest 03/01/2010  FINDINGS: Normal heart size and mediastinal contours.  Numerous cystic foci again identified throughout both lungs primarily in the upper lobes.  Associated bronchiectasis better identified by prior CT.  Multiple areas of nodularity are again  identified.  Few scattered air-fluid levels are seen within the cystic regions.  No definite superimposed acute infiltrate, pleural effusion, or pneumothorax.  Bones appear demineralized.  IMPRESSION: Extensive cystic changes and bronchiectasis involving primarily the upper lobes with few scattered air-fluid levels, appearance similar to previous exam.  No definite superimposed acute infiltrate identified.   Electronically Signed   By: Ulyses Southward M.D.   On: 01/06/2014 17:48   Dg Swallowing Func-speech Pathology  01/08/2014   Maxcine Ham, CCC-SLP     01/08/2014 11:14 AM Objective Swallowing Evaluation: Modified Barium Swallowing Study   Patient Details  Name: DEZARAI PREW MRN: [id number] Date of Birth: Jan 29, 1941  Today's Date: 01/08/2014 Time: 1035-1101 SLP Time Calculation (min): 26 min  Past Medical History:  Past Medical History  Diagnosis Date  . 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 COPD (chronic obstructive pulmonary disease)   . Bronchiectasis   . Hypercalcemia   . GERD (gastroesophageal reflux disease)   . Hypertension   . Asthma     hx of PNA  . Adult failure to thrive 08/06/2012    20 pound weight loss one year, anorexia, pernicious anemia   . Bronchiectasis with acute exacerbation     CXR 11/30/10- Stable cystic bronchiectasis with air fluid  levels. CXR 01/23/12- again shows essentially stable severe  fibro--cystic scarring with bronchiectasis and air fluid levels,  but no obvious progression. Desat to 86% with exertion walking  room air- order portable O2   . GERD   . Pernicious anemia    Past Surgical History:  Past Surgical History  Procedure Laterality Date  . Tonsillectomy    . Lung biopsy     HPI:  73 y/o F with PMH of cystic bronchiectasis, 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  Seen prior to current  admit by Dr. Maple Hudson for 1 week hx of increasing SOB, cough with  productive  yellow-brown sputum & was treated with doxycycline  without improvement.       Assessment / Plan / Recommendation Clinical Impression  Dysphagia Diagnosis: Mild pharyngeal phase dysphagia;Suspected  primary esophageal dysphagia Clinical impression: Pt presents with a mild pharyngeal phase  dysphagia, that is primarily timing and sensory based, although  with a suspected esophageal component. Pt had frank, silent  penetration with larger straw sips, requiring a cued throat  clear. Small cup sips were effective at increasing airway  protection, with trace penetrates clearing upon  completion of the  swallow. Small amounts of retrograde flow to the cervical  esophagus was noted with thin liquids, although material was not  visualized to re-enter the pharynx. Recommend to continue regular  textures and thin liquids by cup sips only, with adherence to  reflux and aspiration precautions.     Treatment Recommendation  Therapy as outlined in treatment plan below    Diet Recommendation Regular;Thin liquid   Liquid Administration via: Cup;No straw Medication Administration: Whole meds with puree Supervision: Patient able to self feed;Intermittent supervision  to cue for compensatory strategies Compensations: Slow rate;Small sips/bites Postural Changes and/or Swallow Maneuvers: Seated upright 90  degrees;Upright 30-60 min after meal    Other  Recommendations Oral Care Recommendations: Oral care BID   Follow Up Recommendations  None    Frequency and Duration min 1 x/week  1 week   Pertinent Vitals/Pain N/A    SLP Swallow Goals     General Date of Onset: 01/06/14 HPI: 73 y/o F with PMH of cystic bronchiectasis, 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  Seen prior to current  admit by Dr. Maple Hudson for 1 week hx of increasing SOB, cough with  productive yellow-brown sputum & was treated with doxycycline  without improvement.   Type of Study: Modified  Barium Swallowing Study Reason for Referral: Objectively evaluate swallowing function Previous Swallow Assessment: BSE 5/20 with frequent throat  clearing Diet Prior to this Study: Regular;Thin liquids Temperature Spikes Noted: No Respiratory Status: Nasal cannula (2L) History of Recent Intubation: No Behavior/Cognition: Alert;Cooperative;Pleasant mood;Other  (comment) (pt appeared anxious throughout evaluation) Oral Cavity - Dentition: Adequate natural dentition Oral Motor / Sensory Function: Within functional limits Self-Feeding Abilities: Able to feed self Patient Positioning: Upright in chair Baseline Vocal Quality: Clear Volitional Cough: Strong Volitional Swallow: Able to elicit Anatomy: Within functional limits Pharyngeal Secretions: Not observed secondary MBS    Reason for Referral Objectively evaluate swallowing function   Oral Phase Oral Preparation/Oral Phase Oral Phase: WFL   Pharyngeal Phase Pharyngeal Phase Pharyngeal Phase: Impaired Pharyngeal - Thin Pharyngeal - Thin Cup: Delayed swallow initiation;Premature  spillage to valleculae;Reduced airway/laryngeal  closure;Penetration/Aspiration during swallow Penetration/Aspiration details (thin cup): Material enters  airway, remains ABOVE vocal cords then ejected out Pharyngeal - Thin Straw: Delayed swallow initiation;Premature  spillage to valleculae;Reduced airway/laryngeal  closure;Penetration/Aspiration during swallow Penetration/Aspiration details (thin straw): Material enters  airway, remains ABOVE vocal cords and not ejected out Pharyngeal - Solids Pharyngeal - Puree: Within functional limits Pharyngeal - Mechanical Soft: Within functional limits Pharyngeal - Pill: Within functional limits  Cervical Esophageal Phase    GO    Cervical Esophageal Phase Cervical Esophageal Phase: Impaired Cervical Esophageal Phase - Thin Thin Cup: Esophageal backflow into cervical esophagus Thin Straw: Esophageal backflow into cervical esophagus Cervical Esophageal  Phase - Solids Puree: Within functional limits Mechanical Soft: Within functional limits Pill: Within functional limits         Maxcine Ham, M.A. CCC-SLP [phone]  Maxcine Ham 01/08/2014, 11:13 AM     Microbiology: Recent Results (from the past 240 hour(s))  CULTURE, EXPECTORATED SPUTUM-ASSESSMENT     Status: None   Collection Time    01/07/14  1:07 AM      Result Value Ref Range Status   Specimen Description SPUTUM   Final   Special Requests Normal   Final   Sputum evaluation     Final   Value: MICROSCOPIC FINDINGS SUGGEST  THAT THIS SPECIMEN IS NOT REPRESENTATIVE OF LOWER RESPIRATORY SECRETIONS. PLEASE RECOLLECT.     RESULT CALLED TO, READ BACK BY AND VERIFIED WITHMaylene Roes RN [phone] GREEN R   Report Status 01/07/2014 FINAL   Final  CULTURE, EXPECTORATED SPUTUM-ASSESSMENT     Status: None   Collection Time    01/07/14  8:39 PM      Result Value Ref Range Status   Specimen Description SPUTUM   Final   Special Requests Normal   Final   Sputum evaluation     Final   Value: MICROSCOPIC FINDINGS SUGGEST THAT THIS SPECIMEN IS NOT REPRESENTATIVE OF LOWER RESPIRATORY SECRETIONS. PLEASE RECOLLECT.     CALLED TO Redgie Grayer 01/07/14 [address] M   Report Status 01/07/2014 FINAL   Final  CULTURE, EXPECTORATED SPUTUM-ASSESSMENT     Status: None   Collection Time    01/07/14 11:24 PM      Result Value Ref Range Status   Specimen Description SPUTUM   Final   Special Requests Normal   Final   Sputum evaluation     Final   Value: THIS SPECIMEN IS ACCEPTABLE. RESPIRATORY CULTURE REPORT TO FOLLOW.   Report Status 01/08/2014 FINAL   Final  CULTURE, RESPIRATORY (NON-EXPECTORATED)     Status: None   Collection Time    01/07/14 11:24 PM      Result Value Ref Range Status   Specimen Description TRACHEAL ASPIRATE   Final   Special Requests NONE   Final   Gram Stain     Final   Value: RARE WBC PRESENT, PREDOMINANTLY PMN     NO SQUAMOUS EPITHELIAL CELLS SEEN     NO  ORGANISMS SEEN     Performed at [facility]   Culture     Final   Value: Non-Pathogenic Oropharyngeal-type Flora Isolated.     Performed at [facility]   Report Status 01/10/2014 FINAL   Final     Labs: Basic Metabolic Panel:  Recent Labs Lab 01/09/14 0724 01/12/14 0525 01/13/14 0455  NA 138 138  --   K 4.0 4.1  --   CL 98 100  --   CO2 34* 34*  --   GLUCOSE 85 90  --   BUN 17 17  --   CREATININE 0.45* 0.36* 0.38*  CALCIUM 8.7 8.6  --    Liver Function Tests: No results found for this basename: AST, ALT, ALKPHOS, BILITOT, PROT, ALBUMIN,  in the last 168 hours No results found for this basename: LIPASE, AMYLASE,  in the last 168 hours No results found for this basename: AMMONIA,  in the last 168 hours CBC: No results found for this basename: WBC, NEUTROABS, HGB, HCT, MCV, PLT,  in the last 168 hours Cardiac Enzymes: No results found for this basename: CKTOTAL, CKMB, CKMBINDEX, TROPONINI,  in the last 168 hours BNP: BNP (last 3 results)  Recent Labs  01/06/14 1746 01/12/14 0525  PROBNP 1290.0* 528.2*   CBG:  Recent Labs Lab 01/09/14 1606  GLUCAP 100*       Signed:  Marinda Elk  [facility] 01/15/2014, 9:59 AM

## 2014-01-15 MED ORDER — IPRATROPIUM BROMIDE 0.02 % IN SOLN: 0.5000 mg | Freq: Three times a day (TID) | RESPIRATORY_TRACT | Status: DC

## 2014-01-15 MED ORDER — LEVALBUTEROL HCL 0.63 MG/3ML IN NEBU: 0.6300 mg | INHALATION_SOLUTION | RESPIRATORY_TRACT | Status: DC | PRN

## 2014-01-15 NOTE — Progress Notes (Signed)
All d/c instructions explained and given to pt by charge nurse at 1221 pm.  Transported off floor via w/c volunteer service to awaiting vehicle.  Tanyon Alipio, Charity fundraiser.

## 2014-01-15 NOTE — Evaluation (Signed)
Physical Therapy Evaluation Patient Details Name: Stacy Novak MRN: [id number] DOB: 1941-03-29 Today's Date: 01/15/2014   History of Present Illness  Pt is a 73 y/o female admitted with increasing SOB from acute on chronic resp failure due to bronchiectasis with exacerbation. Pt is on home O2 3L/min at all times, and states she is limited to transfers due to SOB. Pt also reports she has been sleeping upright in a recliner because of SOB.   Clinical Impression  Pt admitted with the above. Pt currently with functional limitations due to the deficits listed below (see PT Problem List). At the time of PT eval, pt only willing to transfer bed to recliner with no further mobility. Pt appears fearful of becoming SOB and is self-limiting with mobility. At home, pt's husband assists with ADL's and pushes her to the bathroom in a w/c so she does not have to walk. Feel pt would be able to improve her functional status with HHPT, but pt unsure of someone coming into her home. Pt will benefit from skilled PT to increase their independence and safety with mobility to allow discharge to the venue listed below.      Follow Up Recommendations Home health PT;Supervision for mobility/OOB    Equipment Recommendations  3in1 (PT);Other (comment) (Tub bench)    Recommendations for Other Services       Precautions / Restrictions Precautions Precautions: Fall Restrictions Weight Bearing Restrictions: No      Mobility  Bed Mobility Overal bed mobility: Needs Assistance Bed Mobility: Supine to Sit     Supine to sit: Supervision     General bed mobility comments: Pt sitting up in bed at 65 angle and was able to transition to EOB with supervision. Bed rails utilized for support.   Transfers Overall transfer level: Needs assistance Equipment used: None Transfers: Sit to/from [employer] Sit to Stand: Min guard Stand pivot transfers: Min guard       General transfer comment: Pt able  to power-up to full standing with no physical assist, and quickly transitioned from bed to recliner with min guard. Pt appeared steady but declined taking any further steps.   Ambulation/Gait             General Gait Details: Declined  Stairs            Wheelchair Mobility    Modified Rankin (Stroke Patients Only)       Balance Overall balance assessment: Needs assistance Sitting-balance support: Feet supported;No upper extremity supported Sitting balance-Leahy Scale: Fair     Standing balance support: No upper extremity supported Standing balance-Leahy Scale: Fair                               Pertinent Vitals/Pain Pt on 2L/min supplemental O2 throughout session, and sats decreased from 90% to 88% with transfer to recliner. At rest sats at 91% at end of session.     Home Living Family/patient expects to be discharged to:: Private residence Living Arrangements: Spouse/significant other Available Help at Discharge: Family;Available 24 hours/day Type of Home: House Home Access: Stairs to enter Entrance Stairs-Rails: Right Entrance Stairs-Number of Steps: 2 Home Layout: Two level;Able to live on main level with bedroom/bathroom Home Equipment: Wheelchair - manual;Other (comment) (Home oxygen) Additional Comments: Sleeps in a recliner    Prior Function Level of Independence: Needs assistance   Gait / Transfers Assistance Needed: Transfers only - states she is able to  take a couple steps to get to w/c or recliner. Hasn't walked since Christmas (~6 months)  ADL's / Homemaking Assistance Needed: Assist with sponge bath for washing back.         Hand Dominance   Dominant Hand: Right    Extremity/Trunk Assessment   Upper Extremity Assessment: Defer to OT evaluation           Lower Extremity Assessment: Generalized weakness      Cervical / Trunk Assessment: Kyphotic  Communication   Communication: No difficulties  Cognition  Arousal/Alertness: Awake/alert Behavior During Therapy: WFL for tasks assessed/performed Overall Cognitive Status: Within Functional Limits for tasks assessed                      General Comments      Exercises        Assessment/Plan    PT Assessment Patient needs continued PT services  PT Diagnosis Difficulty walking   PT Problem List Decreased strength;Decreased range of motion;Decreased activity tolerance;Decreased balance;Decreased mobility;Decreased knowledge of use of DME;Decreased safety awareness;Decreased knowledge of precautions;Cardiopulmonary status limiting activity  PT Treatment Interventions DME instruction;Gait training;Stair training;Functional mobility training;Therapeutic activities;Therapeutic exercise;Neuromuscular re-education;Patient/family education   PT Goals (Current goals can be found in the Care Plan section) Acute Rehab PT Goals Patient Stated Goal: To return home today PT Goal Formulation: With patient Time For Goal Achievement: 01/22/14 Potential to Achieve Goals: Good    Frequency Min 3X/week   Barriers to discharge        Co-evaluation               End of Session Equipment Utilized During Treatment: Oxygen Activity Tolerance: Other (comment) (Self-limiting - appears anxious with mobility) Patient left: in chair;with call bell/phone within reach Nurse Communication: Mobility status         Time: [id number] PT Time Calculation (min): 20 min   Charges:   PT Evaluation $Initial PT Evaluation Tier I: 1 Procedure PT Treatments $Therapeutic Activity: 8-22 mins   PT G CodesRuthann Novak:          Stacy Novak 01/15/2014, 8:54 AM  Stacy CancerLaura Novak, PT, DPT Acute Rehabilitation Services Pager: [phone]

## 2014-01-15 NOTE — Progress Notes (Signed)
UR completed Ioana Louks K. Lacey Wallman, RN, BSN, MSHL, CCM  01/15/2014 11:16 AM 

## 2014-01-21 ENCOUNTER — Encounter: Payer: Self-pay | Admitting: Adult Health

## 2014-01-21 ENCOUNTER — Ambulatory Visit (INDEPENDENT_AMBULATORY_CARE_PROVIDER_SITE_OTHER): Payer: Medicare Other | Admitting: Adult Health

## 2014-01-21 VITALS — BP 104/62 | HR 92 | Temp 97.9°F | Ht 67.0 in | Wt 115.8 lb

## 2014-01-21 DIAGNOSIS — J471 Bronchiectasis with (acute) exacerbation: Secondary | ICD-10-CM

## 2014-01-21 NOTE — Progress Notes (Signed)
Patient ID: Stacy Novak, female    DOB: 04/23/1941, 73 y.o.   MRN: [id number]  HPI 11/30/10-73 yo F never smoker, followed for bronchiectasis/ COPD, with hx treated atypical AFB/ MAIC, occasional hemoptysis and GERD.  Last here July 29, 2010 after normal flora on sputum cultures in October. Last antibiotic was augmentin in December. She questions if she should have had longer than 1 week treatment. Daily productive cough- yellow to light green. Shortness of breath with exertion across parking lot, up steps. . She does use Flutter occasionally. . No recent blood, fever, sweats or chest pain. Trial of Daliresp caused malaise.   03/01/11-73 yo F never smoker, followed for bronchiectasis/ COPD, with hx treated atypical AFB/ MAIC, occasional hemoptysis and GERD. No acute issues "just can't breathe and walk". She has avoided the weather extremes recently by staying in.  Cough always productive, greenish. Denies fever, sweat.  CXR- 11/30/10- reviewed with her- no definite new areas, but significant cystic bronchiectasis diffusely with some air fluid levels. PFT- 12/21/10- FEV1 1.02/ 49%; FEV1/FVC 0.54; no response to BD; TLC 0.77; DLCO 0.58    Severe obstructive disease, mild restriction.  6MWT She asks to defer formal oxygen assessment till [facility] [place] [facility]. She has less cough in last few days, doesn't feel she could leave sputum specimen, but would like script for brand-name levaquin, which works better.   05/05/11- 73 yo F never smoker, followed for bronchiectasis/ COPD, with hx treated atypical AFB/ MAIC, occasional hemoptysis and GERD. We gave (she requested brand-name) levaquin to hold last visit. She felt she probably needed it because of pleuritic right chest pain that lasted 2-3 days, self limited. She isn't sure there was any change in her daily productive cough- usually white. Maybe some fever once. She waited because she had upset GI with a  little nausea and diarrhea. Still holding the levaquin.   07/06/11-  73 yo F never smoker, followed for bronchiectasis/ COPD, with hx treated atypical AFB/ MAIC, occasional hemoptysis and GERD. Has had flu vaccine. Has not taken the Levaquin prescription we gave previously. She was taking maintenance Zithromax to suppress bronchitis. She stayed off it for 2 weeks because of GI upset with cramps, after successfully taking it for 6 weeks. She has no GI discomfort now. She is going to try resuming maintenance Zithromax, but adding a probiotic. She did not tolerate Daliresp.  11/07/11-  73 yo F never smoker, followed for bronchiectasis/ COPD, with hx treated atypical AFB/ MAIC, occasional hemoptysis and GERD. Still coughing a lot, often productive, with looser mucus. Had tried maintenance Zithromax until she got GI upset. Remains on chronic Nexium for GERD. Discussed her recent reports of cardiac conduction problems with macrolide antibiotics. (REC- next visit- look at HR, chemistry)  01/19/12- 73 yo F never smoker, followed for bronchiectasis/ COPD, with hx treated atypical AFB/ MAIC, occasional hemoptysis and GERD. Coughing up blood-bright red; would happen off and on, but only on May 30 with no repeat today , . SOB and wheezing has increased. Always has some cough which has not changed. No easy bruising or other bleeding. No fever. Previous hemoptysis 2 or 3 years ago at which time we stopped the baby aspirin.  03/19/12-  73 yo F never smoker, followed for bronchiectasis/ COPD, with hx treated atypical AFB/ MAIC, occasional hemoptysis and GERD. Having increased SOB past 3 weeks; losing weight and unsure why; no energy.. Has lost 10 lbs since last visit.  140>130lbs. COPD assessment test (CAT) score 32/40. She complains of weakness, aching joints, weight loss. 2 episodes where she passed mucus per rectum-not blood. Dry eyes, nose and mouth times one month. Bloated with eating-takes her breath. Burning  she-asks B12 shot. Jittery. Has slept up in a chair for years because of chronic midthoracic back pain has not gone back to see Stacy. [name] in a long time. She asks if all of this is a side effect of Nexium. We reviewed her radiology again. CXR 01/23/12-  IMPRESSION:  Stable chronic changes with scattered air-fluid levels, as  described above.  Original Report Authenticated By: [name], M.D.   04/02/12- 73 yo F never smoker, followed for  Cystic bronchiectasis/ COPD, with hx treated atypical AFB/ MAIC, occasional hemoptysis and GERD. Post [facility]03[facility]27[facility]12 for dx bronchiectasis w/ acute exacerbation, COPD, anemia/ B12 deficiency, Failure to thrive.Treated vanc/ zithromax/ ceftazidime. Cultures all no growth. Severe diffuse cystic bronchiectasis. Had ID consult. ECHO 8/2- normal EF60%, unable to visualize for PA pressures. Treated B12 deficiency x 5 days, then outpat injection.  She has not re-established with Stacy Novak/ PCP and says there is not a current record there. Air conditioning broken, so she has been staying at their [facility] place. Eating better, not gaining weight. Weak. Thick white mucus in mouth. No fever, chills, blood, pain, nodes.   04/17/12- Stacy HATCHER/ INF.DIS. DISEASE, PULMONARY D/T MYCOBACTERIA - Stacy Sax, MD 04/17/2012 12:24 PM Signed  She is not doing well based on her weight loss and probable recent exacerbation. I reviewed her CXR with her, there is not a huge difference vs her previous studies from 11-2010. She is offered to restart her MAI therapy but she wants "to get her stomach straightened out". She has quit taking her prilosec/nexium and is relying on tums now.will get a repeat sputum AFB on her (she is given a specimen container and will return after she is able to provide a good specimen).  Needs improved nutrition and probably home O2 (i encouraged her multiple times to call Stacy Maple Novak about this). We may need GI help with her stomach issues.  Will see her  back on October 3.   04/30/12-  73 yo F never smoker, followed for severe Cystic bronchiectasis/ COPD, with hx treated atypical AFB/ MAIC, occasional hemoptysis and GERD. Unable to tell any difference with using O2 QHS-does not use O2 during the day. Increased SOB-not getting any better; Unable to drink much water. Minor epistaxis blamed on dryness from her oxygen. Son thinks/blood-tinged sputum occasionally, otherwise yellowish or greenish. She had stopped Nexium, blaming it for her poor appetite-it seemed to cause bloating. Instead she now uses TUMS and we discussed his meds. Complains of burning in her feet attributed by her to B12 deficiency. I again asked her to get back with her primary physician.  05/14/12- 50 yo F never smoker, followed for severe Cystic bronchiectasis/ COPD, with hx treated atypical AFB/ MAIC, occasional hemoptysis and GERD  Husband here Sputum cx 03/22/12- Nl flora. ACUTE VISIT: increased cough-yellow in color and Increased SOB-getting worse. Cough is chronically productive. In the last few days has felt more short of breath, nonspecific. Had flu vaccine. Using saline nasal spray for nasal dryness on home oxygen at 2 L. She is taking a liquid antacid instead of Nexium and admits reduced by mouth intake. Claims foods makes her bloated CXR 03/22/12- I reviewed the images with patient and husband IMPRESSION:  No significant change in the severe chronic changes as above.  Original Report Authenticated By: Cyndie ChimeKEVIN G. DOVER, M.D.   07/30/12- 73 yo F never smoker, followed for severe Cystic bronchiectasis/ COPD, with hx treated atypical AFB/ MAIC, occasional hemoptysis and GERD   FOLLOWS FOR: not able to eat or drink enough; sores in nose; not wearing O2 every night due to bumps in nose, feels weak and like she needs fluids. Has lost 20 lbs over past year. Always feels hot at first to keep her home about 564 which is too cold for family. [facility]  [place]. Always productive cough without change. Exertion makes her cough more productive. She will see Stacy. Hatcher/Infectious Disease again in January. Continues oxygen 2 L per minute at night and as needed in the day. Never took last prescription for Augmentin in September because she lost the prescription. Wants to continue B12 shots. No numbness in her feet.  09/11/12- 89 yo F never smoker, followed for severe Cystic bronchiectasis/ COPD, with hx treated atypical AFB/ MAIC, occasional hemoptysis and GERD   ACUTE: 86% on RA on arrival-- Stacy. Ninetta LightsHatcher/ ID saw her today and thought she should be seen, heard "rubbing" in her lungs today--diff breathing worsening over last "couple" weeks w even small activities-- dry cough, denies any wheezing or chest tightness.  Exercise tolerance always poor. No distinct event but may be more DOE in last 2 weeks. No change in chronic cough, sometimes productive of yellow green, occasional small streak of blood. No fever, nodes, palpitation or chest pain. Feet swell some, dependent. Very weak. Dyspnea and weakness caused by her lung disease interfere with routine activities- dressing, grooming, ambulating through home. She needs to be able to sit frequently, and needs assistance for sustained ambulation,. She should have a light wheel chair. I don't think she is strong enough to manage a regular weight chair by herself. .  Weight stable 121-123 since October. She didn't take augmentin when given because she says she wasn't taking enough fluids for it- discussed. Has O2 concentrator for sleep but tends to leave it off- prongs bother nose.   10/29/12- 73 yo F never smoker, followed for severe Cystic bronchiectasis/ COPD, with hx treated atypical AFB/ MAIC, occasional hemoptysis and GERD FOLLOWS FOR: Pt states she has not recently been wearing her O2 at night as she feels it makes her breathing worse during the day; States she has noticed increased SOB as well. 85% RA on  arrival-took approximately 2 minutes to reach 90% RA. She finally took the antibiotic we had given her. She stays short of breath but not wearing oxygen. Her portable tank is too heavy for her. We discussed alternatives including small concentrators. She has generally sought to minimize intervention and treatment. 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  Numerous air-fluid levels appear progressive and could be due to  infection.  Original Report Authenticated By: Rudie MeyerP. Gallerani, M.D.  11/29/12- 73 yo F never smoker, followed for severe Cystic bronchiectasis/ COPD, with hx treated atypical AFB/ MAIC, occasional hemoptysis and GERD    Husband is here. FOLLOWS FOR:Pt feels she gets worse when on O2( 2L/ Advanced); unsure of how her breathing is really doing We discussed her oxygen. She says she is "afraid" of oxygen tanks and uncomfortable working with them. We discussed portable concentrators. Her husband is aware that she frequently is not compliant with therapies.  12/11/12- 73 yo F never smoker, followed for severe Cystic bronchiectasis/ COPD, with hx treated atypical AFB/ MAIC,  occasional hemoptysis and GERD    Husband is here. Acute work-in today for increased cough and shortness of breath. She wanted a nebulizer treatment and Depo-Medrol. We discussed her B12 shots and I asked her to followup with her primary physician for long-term decision about this. She has a peripheral neuropathy but the biggest problems have been cachexia and some difficulty with medication compliance. She denies fever, chills, swollen glands. She has a followup appointment with infectious disease.  02/28/13-72 yo F never smoker, followed for severe Cystic bronchiectasis/ COPD, with hx treated atypical AFB/ MAIC, occasional hemoptysis and GERD   She is willing to use portable oxygen now that she has a Designer, jewelleryportable concentrator. She never felt safe with compressed oxygen tanks. Otherwise  little has changed. She remains very weak with persistent cough which is sometimes productive of yellow or green sputum. No blood, fever or adenopathy. She desaturates easily with exertion but is comfortable at rest on room air. CXR 12/17/12 IMPRESSION:  Severe chronic cystic bronchiectasis with superimposed infection,  slightly progressed in the left midzone.  Original Report Authenticated By: Francene BoyersJames Maxwell, M.D.   Review of Systems-See HPI Constitutional:   + weight loss, No-night sweats, No- Fevers, chills, +fatigue, lassitude. HEENT:   No headaches,  Difficulty swallowing,  Tooth/dental problems,  Sore throat,                No sneezing, itching, ear ache, nasal congestion, post nasal drip,  CV:  No chest pain, orthopnea, PND, swelling in lower extremities, anasarca, +dizziness, No-palpitations GI  No heartburn, indigestion, abdominal pain, nausea, vomiting, Resp: +Shortness of breath with exertion. , no recent coughing up of blood. .  Some minimal wheezing.  Skin: no rash or lesions. GU:   MS:  + joint pain or swelling.   Psych:  No change in mood or affect. No depression + anxiety.  No memory loss.  Objective:   Physical Exam General- Alert, Oriented, Affect-appropriate, Distress- none acute; thin. Looks chronically ill, wheelchair, O2 2L / concentrator 93$ Skin- rash-none, lesions- none, excoriation- none. +Dusky hands and feet Lymphadenopathy- none Head- atraumatic            Eyes- Gross vision intact, PERRLA, conjunctivae clear secretions            Ears- Hearing, canals- normal            Nose- Clear, No-Septal dev, mucus, polyps, erosion, perforation             Throat- Mallampati II , mucosa clear , drainage- none, tonsils- atrophic Neck- flexible , trachea midline, no stridor , thyroid nl, carotid no bruit Chest - symmetrical excursion , unlabored           Heart/CV- + remains regular,rapid RRR ., no murmur , no gallop  , no rub, nl s1 s2                            JVD+  full , edema +1, stasis changes- none, varices- none           Lung-  + bilateral  Rhonchi- clearer than last visit, unlabored, cough- mild , dullness-none, rub- none           Chest wall-  Abd-  Br/ Gen/ Rectal- Not done, not indicated Extrem- cyanosis- none, clubbing, none, atrophy- none, strength- nl Neuro- grossly intact to observation             Patient ID: Stacy BernJean  Alphonzo Novak, female    DOB: 12/28/1940, 73 y.o.   MRN: [id number]  HPI 11/30/10-73 yo F never smoker, followed for bronchiectasis/ COPD, with hx treated atypical AFB/ MAIC, occasional hemoptysis and GERD.  Last here July 29, 2010 after normal flora on sputum cultures in October. Last antibiotic was augmentin in December. She questions if she should have had longer than 1 week treatment. Daily productive cough- yellow to light green. Shortness of breath with exertion across parking lot, up steps. . She does use Flutter occasionally. . No recent blood, fever, sweats or chest pain. Trial of Daliresp caused malaise.   03/01/11-73 yo F never smoker, followed for bronchiectasis/ COPD, with hx treated atypical AFB/ MAIC, occasional hemoptysis and GERD. No acute issues "just can't breathe and walk". She has avoided the weather extremes recently by staying in.  Cough always productive, greenish. Denies fever, sweat.  CXR- 11/30/10- reviewed with her- no definite new areas, but significant cystic bronchiectasis diffusely with some air fluid levels. PFT- 12/21/10- FEV1 1.02/ 49%; FEV1/FVC 0.54; no response to BD; TLC 0.77; DLCO 0.58    Severe obstructive disease, mild restriction.  She asks to defer formal oxygen assessment till she finishes spending summer at [facility] with her grandchildren. She has less cough in last few days, doesn't feel she could leave sputum specimen, but would like script for brand-name levaquin, which works better.   05/05/11- 76 yo F never smoker, followed for bronchiectasis/ COPD, with hx treated atypical  AFB/ MAIC, occasional hemoptysis and GERD. We gave (she requested brand-name) levaquin to hold last visit. She felt she probably needed it because of pleuritic right chest pain that lasted 2-3 days, self limited. She isn't sure there was any change in her daily productive cough- usually white. Maybe some fever once. She waited because she had upset GI with a little nausea and diarrhea. Still holding the levaquin.   07/06/11-  63 yo F never smoker, followed for bronchiectasis/ COPD, with hx treated atypical AFB/ MAIC, occasional hemoptysis and GERD. Has had flu vaccine. Has not taken the Levaquin prescription we gave previously. She was taking maintenance Zithromax to suppress bronchitis. She stayed off it for 2 weeks because of GI upset with cramps, after successfully taking it for 6 weeks. She has no GI discomfort now. She is going to try resuming maintenance Zithromax, but adding a probiotic. She did not tolerate Daliresp.  11/07/11-  44 yo F never smoker, followed for bronchiectasis/ COPD, with hx treated atypical AFB/ MAIC, occasional hemoptysis and GERD. Still coughing a lot, often productive, with looser mucus. Had tried maintenance Zithromax until she got GI upset. Remains on chronic Nexium for GERD. Discussed her recent reports of cardiac conduction problems with macrolide antibiotics. (REC- next visit- look at HR, chemistry)  01/19/12- 13 yo F never smoker, followed for bronchiectasis/ COPD, with hx treated atypical AFB/ MAIC, occasional hemoptysis and GERD. Coughing up blood-bright red; would happen off and on, but only on May 30 with no repeat today , . SOB and wheezing has increased. Always has some cough which has not changed. No easy bruising or other bleeding. No fever. Previous hemoptysis 2 or 3 years ago at which time we stopped the baby aspirin.  03/19/12-  42 yo F never smoker, followed for bronchiectasis/ COPD, with hx treated atypical AFB/ MAIC, occasional hemoptysis and  GERD. Having increased SOB past 3 weeks; losing weight and unsure why; no energy.. Has lost 10 lbs since last visit. 140>130lbs. COPD assessment test (  CAT) score 32/40. She complains of weakness, aching joints, weight loss. 2 episodes where she passed mucus per rectum-not blood. Dry eyes, nose and mouth times one month. Bloated with eating-takes her breath. Burning she-asks B12 shot. Jittery. Has slept up in a chair for years because of chronic midthoracic back pain has not gone back to see Stacy. Ricki MillerPang in a long time. She asks if all of this is a side effect of Nexium. We reviewed her radiology again. CXR 01/23/12-  IMPRESSION:  Stable chronic changes with scattered air-fluid levels, as  described above.  Original Report Authenticated By: Charline BillsSRIYESH KRISHNAN, M.D.   04/02/12- 73 yo F never smoker, followed for  Cystic bronchiectasis/ COPD, with hx treated atypical AFB/ MAIC, occasional hemoptysis and GERD. Post Hospital-[facility]1-03[facility]27[facility]12 for dx bronchiectasis w/ acute exacerbation, COPD, anemia/ B12 deficiency, Failure to thrive.Treated vanc/ zithromax/ ceftazidime. Cultures all no growth. Severe diffuse cystic bronchiectasis. Had ID consult. ECHO 8/2- normal EF60%, unable to visualize for PA pressures. Treated B12 deficiency x 5 days, then outpat injection.  She has not re-established with Stacy Novak/ PCP and says there is not a current record there. Air conditioning broken, so [facility]. Eating better, not gaining weight. Weak. Thick white mucus in mouth. No fever, chills, blood, pain, nodes.   04/17/12- Stacy HATCHER/ INF.DIS. DISEASE, PULMONARY D/T MYCOBACTERIA - Stacy SaxJeffrey Hatcher, MD 04/17/2012 12:24 PM Signed  She is not doing well based on her weight loss and probable recent exacerbation. I reviewed her CXR with her, there is not a huge difference vs her previous studies from 11-2010. She is offered to restart her MAI therapy but she wants "to get her stomach straightened out".  She has quit taking her prilosec/nexium and is relying on tums now.will get a repeat sputum AFB on her (she is given a specimen container and will return after she is able to provide a good specimen).  Needs improved nutrition and probably home O2 (i encouraged her multiple times to call Stacy Maple HudsonYoung about this). We may need GI help with her stomach issues.  Will see her back on October 3.   04/30/12-  73 yo F never smoker, followed for severe Cystic bronchiectasis/ COPD, with hx treated atypical AFB/ MAIC, occasional hemoptysis and GERD. Unable to tell any difference with using O2 QHS-does not use O2 during the day. Increased SOB-not getting any better; Unable to drink much water. Minor epistaxis blamed on dryness from her oxygen. Son thinks/blood-tinged sputum occasionally, otherwise yellowish or greenish. She had stopped Nexium, blaming it for her poor appetite-it seemed to cause bloating. Instead she now uses TUMS and we discussed his meds. Complains of burning in her feet attributed by her to B12 deficiency. I again asked her to get back with her primary physician.  05/14/12- 73 yo F never smoker, followed for severe Cystic bronchiectasis/ COPD, with hx treated atypical AFB/ MAIC, occasional hemoptysis and GERD  Husband here Sputum cx 03/22/12- Nl flora. ACUTE VISIT: increased cough-yellow in color and Increased SOB-getting worse. Cough is chronically productive. In the last few days has felt more short of breath, nonspecific. Had flu vaccine. Using saline nasal spray for nasal dryness on home oxygen at 2 L. She is taking a liquid antacid instead of Nexium and admits reduced by mouth intake. Claims foods makes her bloated CXR 03/22/12- I reviewed the images with patient and husband IMPRESSION:  No significant change in the severe chronic changes as above.  Original Report Authenticated By:  Cyndie Chime, M.D.   07/30/12- 53 yo F never smoker, followed for severe Cystic bronchiectasis/ COPD, with hx  treated atypical AFB/ MAIC, occasional hemoptysis and GERD   FOLLOWS FOR: not able to eat or drink enough; sores in nose; not wearing O2 every night due to bumps in nose, feels weak and like she needs fluids. Has lost 20 lbs over past year. Always feels hot at first to keep her home about 50 which is too cold for family. Still spending most of [facility]. Always productive cough without change. Exertion makes her cough more productive. She will see Stacy. Hatcher/Infectious Disease again in January. Continues oxygen 2 L per minute at night and as needed in the day. Never took last prescription for Augmentin in September because she lost the prescription. Wants to continue B12 shots. No numbness in her feet.  09/11/12- 25 yo F never smoker, followed for severe Cystic bronchiectasis/ COPD, with hx treated atypical AFB/ MAIC, occasional hemoptysis and GERD   ACUTE: 86% on RA on arrival-- Stacy. Ninetta Lights ID saw her today and thought she should be seen, heard "rubbing" in her lungs today--diff breathing worsening over last "couple" weeks w even small activities-- dry cough, denies any wheezing or chest tightness.  Exercise tolerance always poor. No distinct event but may be more DOE in last 2 weeks. No change in chronic cough, sometimes productive of yellow green, occasional small streak of blood. No fever, nodes, palpitation or chest pain. Feet swell some, dependent. Very weak. Dyspnea and weakness caused by her lung disease interfere with routine activities- dressing, grooming, ambulating through home. She needs to be able to sit frequently, and needs assistance for sustained ambulation,. She should have a light wheel chair. I don't think she is strong enough to manage a regular weight chair by herself. .  Weight stable 121-123 since October. She didn't take augmentin when given because she says she wasn't taking enough fluids for it- discussed. Has O2 concentrator for sleep but tends to leave it  off- prongs bother nose.   10/29/12- 27 yo F never smoker, followed for severe Cystic bronchiectasis/ COPD, with hx treated atypical AFB/ MAIC, occasional hemoptysis and GERD FOLLOWS FOR: Pt states she has not recently been wearing her O2 at night as she feels it makes her breathing worse during the day; States she has noticed increased SOB as well. 85% RA on arrival-took approximately 2 minutes to reach 90% RA. She finally took the antibiotic we had given her. She stays short of breath but not wearing oxygen. Her portable tank is too heavy for her. We discussed alternatives including small concentrators. She has generally sought to minimize intervention and treatment. 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  Numerous air-fluid levels appear progressive and could be due to  infection.  Original Report Authenticated By: Rudie Meyer, M.D.  11/29/12- 55 yo F never smoker, followed for severe Cystic bronchiectasis/ COPD, with hx treated atypical AFB/ MAIC, occasional hemoptysis and GERD    Husband is here. FOLLOWS FOR:Pt feels she gets worse when on O2( 2L/ [facility]); unsure of how her breathing is really doing We discussed her oxygen. She says she is "afraid" of oxygen tanks and uncomfortable working with them. We discussed portable concentrators. Her husband is aware that she frequently is not compliant with therapies.  12/11/12- 74 yo F never smoker, followed for severe Cystic bronchiectasis/ COPD, with hx treated atypical AFB/ MAIC, occasional hemoptysis and GERD  Husband is here. Acute work-in today for increased cough and shortness of breath. She wanted a nebulizer treatment and Depo-Medrol. We discussed her B12 shots and I asked her to followup with her primary physician for long-term decision about this. She has a peripheral neuropathy but the biggest problems have been cachexia and some difficulty with medication compliance. She denies fever, chills,  swollen glands. She has a followup appointment with infectious disease.  02/28/13-72 yo F never smoker, followed for severe Cystic bronchiectasis/ COPD, with hx treated atypical AFB/ MAIC, occasional hemoptysis and GERD   She is willing to use portable oxygen now that she has a Designer, jewellery. She never felt safe with compressed oxygen tanks. Otherwise little has changed. She remains very weak with persistent cough which is sometimes productive of yellow or green sputum. No blood, fever or adenopathy. She desaturates easily with exertion but is comfortable at rest on room air. CXR 12/17/12 IMPRESSION:  Severe chronic cystic bronchiectasis with superimposed infection,  slightly progressed in the left midzone.  Original Report Authenticated By: Francene Boyers, M.D.  06/25/13- 70 yo F never smoker, followed for severe Cystic bronchiectasis/ COPD, with hx treated atypical AFB/ MAIC, occasional hemoptysis and GERD    Husband heer FOLLOWS FOR: Pt states her breathing has gotten worse; was seen by Stacy Ninetta Lights yesterday and her O2 levels were dropping. Told to follow up today. She says she desaturated while coughing at [facility]. More SOB/ DOE 3 weeks. Chronic cough not changed- brownish, no blood, fever or chills. She tries using Flutter but it is not helping enough. We discussed pneumatic VEST. O2 2-3L/Min Stacy Novak portable concentrator.   11/18/13- 55 yo F never smoker, followed for severe Cystic bronchiectasis/ COPD, with hx treated atypical AFB/ MAIC, occasional hemoptysis and GERD    Husband here  11/18/13- 37 yo F never smoker, followed for severe Cystic bronchiectasis/ COPD, with hx treated atypical AFB/ MAIC, occasional hemoptysis and GERD    Husband here FOLLOWS FOR: Pt c/o an increase in SOB x 2 weeks. Pt has productive cough with brown mucous x years. Pt was 84% when waiting in lobby, pt stated it was d/t coughing, with rest pt O2 increase to 92% Recent viral system acute infection started  about 2 weeks ago with increased cough and shortness of breath. Sputum white or dark with scant blood streak. Doesn't know if she had fever. Mild GI upset. Continues oxygen O2 2-3L/Min [facility] portable concentrator  01/02/14- 81 yo F never smoker, followed for severe Cystic bronchiectasis/ COPD, with hx treated atypical AFB/ MAIC, occasional hemoptysis and GERD    Husband here ACUTE VISIT: increased SOB and wheezing x 1 week at least. Cough-productive-yellow to brown in color. Stays hot as well. Pt has feelings of passing out at times. O2 2-3L [facility] portable concentrator  01/21/2014 Post Hospital follow up  Patient presents for a post hospital followup. Patient was admitted, may 19 2 may 28th for an acute exacerbation of bronchiectasis with acute on chronic respiratory failure She was treated with aggressive IV antibiotics, nebulized bronchodilators.  She did have a mild decompensation of diastolic heart failure requiring gentle diuresis.  Reports some good days and bad since discharge.   Does report some occasional dyspnea, wheezing, tightness, cough. Encouraged on VEST compliance. (not using)  On 2-3 l/m Oxygen .  Was started on Atrovent and Xopenex neb at discharge.      Review of Systems-See HPI Constitutional:   No-weight loss, No-night sweats, No- Fevers, chills, +fatigue, lassitude. HEENT:  No headaches,  Difficulty swallowing,  Tooth/dental problems,  Sore throat,                No sneezing, itching, ear ache, nasal congestion, post nasal drip,  CV:  No chest pain, orthopnea, PND, swelling in lower extremities, anasarca,  No-palpitations GI  No heartburn, indigestion, abdominal pain, nausea, vomiting, Resp: +Shortness of breath with exertion.  Skin: no rash or lesions. GU:   MS:  + joint pain or swelling , wheelchair Psych:  No change in mood or affect. No depression + anxiety.  No memory loss.  Objective:   Physical Exam General- Alert, Oriented, Affect-appropriate,  Distress- none acute; thin. Looks chronically ill,              +wheelchair, O2 2L  Skin- rash-none, lesions- none, excoriation- none.  Lymphadenopathy- none Head- atraumatic            Eyes- Gross vision intact, PERRLA, conjunctivae clear secretions            Ears- Hearing, canals- normal            Nose- Clear, No-Septal dev, mucus, polyps, erosion, perforation             Throat- Mallampati II , mucosa clear , drainage- none, tonsils- atrophic Neck- flexible , trachea midline, no stridor , thyroid nl, carotid no bruit Chest - symmetrical excursion , unlabored           Heart/CV- + remains regular,rapid RRR ., no murmur , no gallop  , no rub, nl s1 s2                            JVD+ full , edema +trace, stasis changes- none, varices- none           Lung-  + bilateral  rhonchi, unlabored, cough- mild , dullness-none, rub- none           Chest wall-  Abd-  Br/ Gen/ Rectal- Not done, not indicated Extrem- cyanosis- none, clubbing, none, atrophy- none, strength- nl Neuro- grossly intact to observation

## 2014-01-21 NOTE — Patient Instructions (Signed)
Try to wear VEST everyday.  May use Mucinex Twice daily  For cough/congestion Continue Ipratropium Three times a day   May use Xopenex Neb every 3hr as needed.  Fluids and rest  Continue with PT at home  Follow up Dr. Maple Hudson  Next month and As needed   Please contact office for sooner follow up if symptoms do not improve or worsen or seek emergency care

## 2014-01-21 NOTE — Assessment & Plan Note (Signed)
Recent flare with hospitalization now improving   Plan  Try to wear VEST everyday.  May use Mucinex Twice daily  For cough/congestion Continue Ipratropium Three times a day   May use Xopenex Neb every 3hr as needed.  Fluids and rest  Continue with PT at home  Follow up Dr. Maple Hudson  Next month and As needed   Please contact office for sooner follow up if symptoms do not improve or worsen or seek emergency care

## 2014-01-22 ENCOUNTER — Ambulatory Visit (INDEPENDENT_AMBULATORY_CARE_PROVIDER_SITE_OTHER): Payer: Medicare Other

## 2014-01-22 DIAGNOSIS — D519 Vitamin B12 deficiency anemia, unspecified: Secondary | ICD-10-CM

## 2014-01-22 DIAGNOSIS — D518 Other vitamin B12 deficiency anemias: Secondary | ICD-10-CM

## 2014-01-22 MED ORDER — CYANOCOBALAMIN 1000 MCG/ML IJ SOLN
1000.0000 ug | Freq: Once | INTRAMUSCULAR | Status: AC
  Administered 2014-01-22: 1000 ug via INTRAMUSCULAR

## 2014-01-26 ENCOUNTER — Telehealth: Payer: Self-pay | Admitting: *Deleted

## 2014-01-26 NOTE — Telephone Encounter (Signed)
Left detailed message on VM.

## 2014-01-26 NOTE — Telephone Encounter (Signed)
Verbal ok?

## 2014-01-26 NOTE — Telephone Encounter (Signed)
Darl Pikes from [facility] called requesting Home health Nursing once a week for 2 weeks.  Please advise

## 2014-01-29 DIAGNOSIS — J479 Bronchiectasis, uncomplicated: Secondary | ICD-10-CM

## 2014-01-29 DIAGNOSIS — IMO0001 Reserved for inherently not codable concepts without codable children: Secondary | ICD-10-CM

## 2014-01-29 DIAGNOSIS — R1310 Dysphagia, unspecified: Secondary | ICD-10-CM

## 2014-01-29 DIAGNOSIS — E43 Unspecified severe protein-calorie malnutrition: Secondary | ICD-10-CM

## 2014-01-30 ENCOUNTER — Ambulatory Visit: Payer: Medicare Other | Admitting: Internal Medicine

## 2014-02-04 ENCOUNTER — Ambulatory Visit: Payer: Medicare Other | Admitting: Internal Medicine

## 2014-02-04 ENCOUNTER — Ambulatory Visit (INDEPENDENT_AMBULATORY_CARE_PROVIDER_SITE_OTHER): Payer: Medicare Other | Admitting: Internal Medicine

## 2014-02-04 ENCOUNTER — Encounter: Payer: Self-pay | Admitting: Internal Medicine

## 2014-02-04 VITALS — BP 128/60 | HR 107 | Temp 98.2°F | Wt 116.8 lb

## 2014-02-04 DIAGNOSIS — R609 Edema, unspecified: Secondary | ICD-10-CM | POA: Insufficient documentation

## 2014-02-04 DIAGNOSIS — D519 Vitamin B12 deficiency anemia, unspecified: Secondary | ICD-10-CM

## 2014-02-04 DIAGNOSIS — I10 Essential (primary) hypertension: Secondary | ICD-10-CM

## 2014-02-04 DIAGNOSIS — D518 Other vitamin B12 deficiency anemias: Secondary | ICD-10-CM

## 2014-02-04 DIAGNOSIS — J9620 Acute and chronic respiratory failure, unspecified whether with hypoxia or hypercapnia: Secondary | ICD-10-CM

## 2014-02-04 DIAGNOSIS — J441 Chronic obstructive pulmonary disease with (acute) exacerbation: Secondary | ICD-10-CM

## 2014-02-04 MED ORDER — DILTIAZEM HCL ER COATED BEADS 120 MG PO CP24: 120.0000 mg | ORAL_CAPSULE | Freq: Every day | ORAL | Status: AC

## 2014-02-04 NOTE — Assessment & Plan Note (Signed)
Chronic hypoxic respiratory failure related to same. History of MAI infection. Acute for exacerbation of bronchiectasis with hospitalization May 2015 reviewed Follows with pulmonary related to same but has continued to clinically decline despite medications -  on oxygen therapy but only 2LPM (reviewed recommended for 3LPM last month). Daily dyspnea on exertion symptoms, reviewed last echo (normal LVEF 08/2013) No medication changes recommended at this time

## 2014-02-04 NOTE — Progress Notes (Signed)
Subjective:    Patient ID: Stacy Novak, female    DOB: 12/03/1940, 73 y.o.   MRN: [id number]  HPI  Patient is here for hospital follow up : Admit date: 01/06/2014  Discharge date: 01/15/2014  Time spent: 35 minutes  Recommendations for Outpatient Follow-up:  1. follow up with pulmonary in 1 week. Assess her oxygen saturation needs. 2. Will have to discuss code status with PCP as her lung function cont to deteriorate. Discharge Diagnoses:  Principal Problem:  Acute on chronic respiratory failure  Active Problems:  Bronchiectasis without acute exacerbation  B12 deficiency anemia  COPD exacerbation  Discharge Condition: Guarded  Diet recommendation: regular diet with thin liquids; upright position during and after meals; avoid the use of straws.  Reviewed chronic medical issues and interval medical events  Past Medical History  Diagnosis Date  . 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 COPD (chronic obstructive pulmonary disease)   . Bronchiectasis   . Hypercalcemia   . GERD (gastroesophageal reflux disease)   . Hypertension   . Asthma     hx of PNA  . Adult failure to thrive 08/06/2012    20 pound weight loss one year, anorexia, pernicious anemia   . 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 CXR 01/23/12- again shows essentially stable severe fibro--cystic scarring with bronchiectasis and air fluid levels, but no obvious progression. Desat to 86% with exertion walking room air- order portable O2   . GERD   . Pernicious anemia     Review of Systems  Constitutional: Positive for fatigue. Negative for fever.  Respiratory: Positive for cough and shortness of breath. Negative for choking and chest tightness.   Cardiovascular: Positive for palpitations and leg swelling (at ankles, worse at end of day). Negative for chest pain.  Neurological: Positive for weakness.       Objective:     Physical Exam  BP 128/60  Pulse 107  Temp(Src) 98.2 F (36.8 C) (Oral)  Wt 116 lb 12.8 oz (52.98 kg)  SpO2 90% Wt Readings from Last 3 Encounters:  02/04/14 116 lb 12.8 oz (52.98 kg)  01/21/14 115 lb 12.8 oz (52.527 kg)  01/15/14 111 lb 14.4 oz (50.758 kg)   Constitutional: She is thin, chronically ill -but appears well-developed and well-nourished. No distress. son at side Neck: Normal range of motion. Neck supple. No JVD present. No thyromegaly present.  Cardiovascular: Normal rate, regular rhythm and normal heart sounds.  No murmur heard. No BLE edema. Pulmonary/Chest: Effort increased at rest, mild conversational dyspnea and breath sounds with bilateral course crackles. She has no wheezes.  Psychiatric: She has a normal mood and affect. Her behavior is normal. Judgment and thought content normal.   Lab Results  Component Value Date   WBC 3.9* 01/07/2014   HGB 12.5 01/07/2014   HCT 41.6 01/07/2014   PLT 240 01/07/2014   GLUCOSE 90 01/12/2014   CHOL 124 10/17/2013   TRIG 55.0 10/17/2013   HDL 54.80 10/17/2013   LDLCALC 58 10/17/2013   ALT 10 10/17/2013   AST 16 10/17/2013   NA 138 01/12/2014   K 4.1 01/12/2014   CL 100 01/12/2014   CREATININE 0.38* 01/13/2014   BUN 17 01/12/2014   CO2 34* 01/12/2014   TSH 1.29 10/17/2013   INR 1.1* 01/19/2012   HGBA1C 6.3 02/18/2013    Dg Chest Portable 1 View  01/06/2014   CLINICAL DATA:  Shortness of breath for 5 days, history asthma, hypertension, bronchiectasis, COPD, mycobacterial disease  EXAM: PORTABLE CHEST - 1 VIEW  COMPARISON:  Portable exam 1711 hr compared to 01/02/2014  Correlation:  CT chest 03/01/2010  FINDINGS: Normal heart size and mediastinal contours.  Numerous cystic foci again identified throughout both lungs primarily in the upper lobes.  Associated bronchiectasis better identified by prior CT.  Multiple areas of nodularity are again identified.  Few scattered air-fluid levels are seen within the cystic regions.  No definite  superimposed acute infiltrate, pleural effusion, or pneumothorax.  Bones appear demineralized.  IMPRESSION: Extensive cystic changes and bronchiectasis involving primarily the upper lobes with few scattered air-fluid levels, appearance similar to previous exam.  No definite superimposed acute infiltrate identified.   Electronically Signed   By: Ulyses SouthwardMark  Boles M.D.   On: 01/06/2014 17:48       Assessment & Plan:   Time spent with pt/family today 30 minutes, greater than 50% time spent counseling patient on progress of hypoxic respiratory failure, recent hospitalization and medication review. Also review of code status and potential for poor outcome with recurrent hospitalization due to comorbid medical disease. Patient currently states she "believes in hope" and refuses any input from palliative care or consideration of limited intervention   Problem List Items Addressed This Visit   Acute on chronic respiratory failure - Primary     Chronic hypoxic respiratory failure related to same. History of MAI infection. Acute for exacerbation of bronchiectasis with hospitalization May 2015 reviewed Follows with pulmonary related to same but has continued to clinically decline despite medications -  on oxygen therapy but only 2LPM (reviewed recommended for 3LPM last month). Daily dyspnea on exertion symptoms, reviewed last echo (normal LVEF 08/2013) No medication changes recommended at this time    B12 deficiency anemia     Patient contributes prior use of PPI and subsequent B12 deficiency as cause of weakness rather than advanced pulmonary disease Continue B12 supplementation as ongoing    COPD exacerbation   Edema     Mild dependent edema, potential for right heart failure reviewed given chronic hypoxic pulmonary disease 2-D echocardiogram January 2015 with normal LVEF Consider daily low-dose diuretic if electrolytes stable Status post thoracentesis during hospitalization last month    HTN  (hypertension) (Chronic)      BP Readings from Last 3 Encounters:  02/04/14 128/60  01/21/14 104/62  01/15/14 111/70   Previously on hydrochlorothiazide therapy, stopped early 2014 because blood pressure well controlled, Because of tachycardia and HTN, Dilt initiated 12/2013 - continue same, refill today    Relevant Medications      diltiazem (CARDIZEM CD) 24 hr capsule

## 2014-02-04 NOTE — Assessment & Plan Note (Signed)
BP Readings from Last 3 Encounters:  02/04/14 128/60  01/21/14 104/62  01/15/14 111/70   Previously on hydrochlorothiazide therapy, stopped early 2014 because blood pressure well controlled, Because of tachycardia and HTN, Dilt initiated 12/2013 - continue same, refill today

## 2014-02-04 NOTE — Assessment & Plan Note (Signed)
Patient contributes prior use of PPI and subsequent B12 deficiency as cause of weakness rather than advanced pulmonary disease Continue B12 supplementation as ongoing

## 2014-02-04 NOTE — Assessment & Plan Note (Signed)
Mild dependent edema, potential for right heart failure reviewed given chronic hypoxic pulmonary disease 2-D echocardiogram January 2015 with normal LVEF Consider daily low-dose diuretic if electrolytes stable Status post thoracentesis during hospitalization last month

## 2014-02-04 NOTE — Progress Notes (Signed)
Pre visit review using our clinic review tool, if applicable. No additional management support is needed unless otherwise documented below in the visit note. 

## 2014-02-04 NOTE — Patient Instructions (Signed)
It was good to see you today.  We have reviewed your prior records including labs and tests today  Medications reviewed and updated, no changes recommended at this time. Refill on medication(s) as discussed today.  Please keep scheduled appointment, call sooner if problems.

## 2014-02-15 NOTE — Assessment & Plan Note (Signed)
Severe chronic cystic bronchiectasis, underlying cause not defined. We have not been able to show that this is progressive atypical AFB or that she refluxes and aspirates. I do not understand the choices she makes. Plan-chest x-ray, doxycycline. She does not want to change home care companies even though [facility] cannot provide the flow rate she needs

## 2014-02-15 NOTE — Assessment & Plan Note (Signed)
No documented recurrence 

## 2014-02-19 ENCOUNTER — Ambulatory Visit (INDEPENDENT_AMBULATORY_CARE_PROVIDER_SITE_OTHER): Payer: Medicare Other

## 2014-02-19 DIAGNOSIS — D518 Other vitamin B12 deficiency anemias: Secondary | ICD-10-CM

## 2014-02-19 DIAGNOSIS — D519 Vitamin B12 deficiency anemia, unspecified: Secondary | ICD-10-CM

## 2014-02-19 MED ORDER — CYANOCOBALAMIN 1000 MCG/ML IJ SOLN
1000.0000 ug | Freq: Once | INTRAMUSCULAR | Status: AC
  Administered 2014-02-19: 1000 ug via INTRAMUSCULAR

## 2014-03-16 ENCOUNTER — Ambulatory Visit (INDEPENDENT_AMBULATORY_CARE_PROVIDER_SITE_OTHER): Payer: Medicare Other | Admitting: Internal Medicine

## 2014-03-16 ENCOUNTER — Encounter: Payer: Self-pay | Admitting: Internal Medicine

## 2014-03-16 VITALS — BP 128/78 | HR 110 | Ht 65.5 in | Wt 119.0 lb

## 2014-03-16 DIAGNOSIS — A310 Pulmonary mycobacterial infection: Secondary | ICD-10-CM

## 2014-03-16 DIAGNOSIS — D510 Vitamin B12 deficiency anemia due to intrinsic factor deficiency: Secondary | ICD-10-CM

## 2014-03-16 DIAGNOSIS — E43 Unspecified severe protein-calorie malnutrition: Secondary | ICD-10-CM

## 2014-03-16 DIAGNOSIS — J479 Bronchiectasis, uncomplicated: Secondary | ICD-10-CM

## 2014-03-16 DIAGNOSIS — J441 Chronic obstructive pulmonary disease with (acute) exacerbation: Secondary | ICD-10-CM

## 2014-03-16 MED ORDER — METHYLPREDNISOLONE ACETATE 80 MG/ML IJ SUSP
80.0000 mg | Freq: Once | INTRAMUSCULAR | Status: AC
  Administered 2014-03-16: 80 mg via INTRAMUSCULAR

## 2014-03-16 NOTE — Progress Notes (Signed)
Patient ID: Stacy Novak, female    DOB: 04/23/1941, 73 y.o.   MRN: [id number]  HPI 11/30/10-73 yo F never smoker, followed for bronchiectasis/ COPD, with hx treated atypical AFB/ MAIC, occasional hemoptysis and GERD.  Last here July 29, 2010 after normal flora on sputum cultures in October. Last antibiotic was augmentin in December. She questions if she should have had longer than 1 week treatment. Daily productive cough- yellow to light green. Shortness of breath with exertion across parking lot, up steps. . She does use Flutter occasionally. . No recent blood, fever, sweats or chest pain. Trial of Daliresp caused malaise.   03/01/11-73 yo F never smoker, followed for bronchiectasis/ COPD, with hx treated atypical AFB/ MAIC, occasional hemoptysis and GERD. No acute issues "just can't breathe and walk". She has avoided the weather extremes recently by staying in.  Cough always productive, greenish. Denies fever, sweat.  CXR- 11/30/10- reviewed with her- no definite new areas, but significant cystic bronchiectasis diffusely with some air fluid levels. PFT- 12/21/10- FEV1 1.02/ 49%; FEV1/FVC 0.54; no response to BD; TLC 0.77; DLCO 0.58    Severe obstructive disease, mild restriction.  6MWT She asks to defer formal oxygen assessment till [facility]. She has less cough in last few days, doesn't feel she could leave sputum specimen, but would like script for brand-name levaquin, which works better.   05/05/11- 73 yo F never smoker, followed for bronchiectasis/ COPD, with hx treated atypical AFB/ MAIC, occasional hemoptysis and GERD. We gave (she requested brand-name) levaquin to hold last visit. She felt she probably needed it because of pleuritic right chest pain that lasted 2-3 days, self limited. She isn't sure there was any change in her daily productive cough- usually white. Maybe some fever once. She waited because she had upset GI with a  little nausea and diarrhea. Still holding the levaquin.   07/06/11-  73 yo F never smoker, followed for bronchiectasis/ COPD, with hx treated atypical AFB/ MAIC, occasional hemoptysis and GERD. Has had flu vaccine. Has not taken the Levaquin prescription we gave previously. She was taking maintenance Zithromax to suppress bronchitis. She stayed off it for 2 weeks because of GI upset with cramps, after successfully taking it for 6 weeks. She has no GI discomfort now. She is going to try resuming maintenance Zithromax, but adding a probiotic. She did not tolerate Daliresp.  11/07/11-  73 yo F never smoker, followed for bronchiectasis/ COPD, with hx treated atypical AFB/ MAIC, occasional hemoptysis and GERD. Still coughing a lot, often productive, with looser mucus. Had tried maintenance Zithromax until she got GI upset. Remains on chronic Nexium for GERD. Discussed her recent reports of cardiac conduction problems with macrolide antibiotics. (REC- next visit- look at HR, chemistry)  01/19/12- 73 yo F never smoker, followed for bronchiectasis/ COPD, with hx treated atypical AFB/ MAIC, occasional hemoptysis and GERD. Coughing up blood-bright red; would happen off and on, but only on May 30 with no repeat today , . SOB and wheezing has increased. Always has some cough which has not changed. No easy bruising or other bleeding. No fever. Previous hemoptysis 2 or 3 years ago at which time we stopped the baby aspirin.  03/19/12-  73 yo F never smoker, followed for bronchiectasis/ COPD, with hx treated atypical AFB/ MAIC, occasional hemoptysis and GERD. Having increased SOB past 3 weeks; losing weight and unsure why; no energy.. Has lost 10 lbs since last visit.  140>130lbs. COPD assessment test (CAT) score 32/40. She complains of weakness, aching joints, weight loss. 2 episodes where she passed mucus per rectum-not blood. Dry eyes, nose and mouth times one month. Bloated with eating-takes her breath. Burning  she-asks B12 shot. Jittery. Has slept up in a chair for years because of chronic midthoracic back pain has not gone back to see Dr. [name] in a long time. She asks if all of this is a side effect of Nexium. We reviewed her radiology again. CXR 01/23/12-  IMPRESSION:  Stable chronic changes with scattered air-fluid levels, as  described above.  Original Report Authenticated By: [name], M.D.   04/02/12- 73 yo F never smoker, followed for  Cystic bronchiectasis/ COPD, with hx treated atypical AFB/ MAIC, occasional hemoptysis and GERD. [facility]03[facility]12 for dx bronchiectasis w/ acute exacerbation, COPD, anemia/ B12 deficiency, Failure to thrive.Treated vanc/ zithromax/ ceftazidime. Cultures all no growth. Severe diffuse cystic bronchiectasis. Had ID consult. ECHO 8/2- normal EF60%, unable to visualize for PA pressures. Treated B12 deficiency x 5 days, then outpat injection.  She has not re-established with [name]/ PCP and says there is not a current record there. Air conditioning broken, so she has been staying at their [facility] place. Eating better, not gaining weight. Weak. Thick white mucus in mouth. No fever, chills, blood, pain, nodes.   04/17/12- [name]/ INF.DIS. DISEASE, PULMONARY D/T MYCOBACTERIA - [name], MD 04/17/2012 12:24 PM Signed  She is not doing well based on her weight loss and probable recent exacerbation. I reviewed her CXR with her, there is not a huge difference vs her previous studies from 11-2010. She is offered to restart her MAI therapy but she wants "to get her stomach straightened out". She has quit taking her prilosec/nexium and is relying on tums now.will get a repeat sputum AFB on her (she is given a specimen container and will return after she is able to provide a good specimen).  Needs improved nutrition and probably home O2 (i encouraged her multiple times to call [name] about this). We may need GI help with her stomach issues.  Will see her  back on October 3.   04/30/12-  73 yo F never smoker, followed for severe Cystic bronchiectasis/ COPD, with hx treated atypical AFB/ MAIC, occasional hemoptysis and GERD. Unable to tell any difference with using O2 QHS-does not use O2 during the day. Increased SOB-not getting any better; Unable to drink much water. Minor epistaxis blamed on dryness from her oxygen. Son thinks/blood-tinged sputum occasionally, otherwise yellowish or greenish. She had stopped Nexium, blaming it for her poor appetite-it seemed to cause bloating. Instead she now uses TUMS and we discussed his meds. Complains of burning in her feet attributed by her to B12 deficiency. I again asked her to get back with her primary physician.  05/14/12- 50 yo F never smoker, followed for severe Cystic bronchiectasis/ COPD, with hx treated atypical AFB/ MAIC, occasional hemoptysis and GERD  Husband here Sputum cx 03/22/12- Nl flora. ACUTE VISIT: increased cough-yellow in color and Increased SOB-getting worse. Cough is chronically productive. In the last few days has felt more short of breath, nonspecific. Had flu vaccine. Using saline nasal spray for nasal dryness on home oxygen at 2 L. She is taking a liquid antacid instead of Nexium and admits reduced by mouth intake. Claims foods makes her bloated CXR 03/22/12- I reviewed the images with patient and husband IMPRESSION:  No significant change in the severe chronic changes as above.  Original Report Authenticated By: Cyndie ChimeKEVIN G. DOVER, M.D.   07/30/12- 73 yo F never smoker, followed for severe Cystic bronchiectasis/ COPD, with hx treated atypical AFB/ MAIC, occasional hemoptysis and GERD   FOLLOWS FOR: not able to eat or drink enough; sores in nose; not wearing O2 every night due to bumps in nose, feels weak and like she needs fluids. Has lost 20 lbs over past year. Always feels hot at first to keep her home about 564 which is too cold for family. [facility]  [place]. Always productive cough without change. Exertion makes her cough more productive. She will see Dr. Hatcher/Infectious Disease again in January. Continues oxygen 2 L per minute at night and as needed in the day. Never took last prescription for Augmentin in September because she lost the prescription. Wants to continue B12 shots. No numbness in her feet.  09/11/12- 89 yo F never smoker, followed for severe Cystic bronchiectasis/ COPD, with hx treated atypical AFB/ MAIC, occasional hemoptysis and GERD   ACUTE: 86% on RA on arrival-- Dr. Ninetta LightsHatcher/ ID saw her today and thought she should be seen, heard "rubbing" in her lungs today--diff breathing worsening over last "couple" weeks w even small activities-- dry cough, denies any wheezing or chest tightness.  Exercise tolerance always poor. No distinct event but may be more DOE in last 2 weeks. No change in chronic cough, sometimes productive of yellow green, occasional small streak of blood. No fever, nodes, palpitation or chest pain. Feet swell some, dependent. Very weak. Dyspnea and weakness caused by her lung disease interfere with routine activities- dressing, grooming, ambulating through home. She needs to be able to sit frequently, and needs assistance for sustained ambulation,. She should have a light wheel chair. I don't think she is strong enough to manage a regular weight chair by herself. .  Weight stable 121-123 since October. She didn't take augmentin when given because she says she wasn't taking enough fluids for it- discussed. Has O2 concentrator for sleep but tends to leave it off- prongs bother nose.   10/29/12- 73 yo F never smoker, followed for severe Cystic bronchiectasis/ COPD, with hx treated atypical AFB/ MAIC, occasional hemoptysis and GERD FOLLOWS FOR: Pt states she has not recently been wearing her O2 at night as she feels it makes her breathing worse during the day; States she has noticed increased SOB as well. 85% RA on  arrival-took approximately 2 minutes to reach 90% RA. She finally took the antibiotic we had given her. She stays short of breath but not wearing oxygen. Her portable tank is too heavy for her. We discussed alternatives including small concentrators. She has generally sought to minimize intervention and treatment. 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  Numerous air-fluid levels appear progressive and could be due to  infection.  Original Report Authenticated By: Rudie MeyerP. Gallerani, M.D.  11/29/12- 73 yo F never smoker, followed for severe Cystic bronchiectasis/ COPD, with hx treated atypical AFB/ MAIC, occasional hemoptysis and GERD    Husband is here. FOLLOWS FOR:Pt feels she gets worse when on O2( 2L/ Advanced); unsure of how her breathing is really doing We discussed her oxygen. She says she is "afraid" of oxygen tanks and uncomfortable working with them. We discussed portable concentrators. Her husband is aware that she frequently is not compliant with therapies.  12/11/12- 73 yo F never smoker, followed for severe Cystic bronchiectasis/ COPD, with hx treated atypical AFB/ MAIC,  occasional hemoptysis and GERD    Husband is here. Acute work-in today for increased cough and shortness of breath. She wanted a nebulizer treatment and Depo-Medrol. We discussed her B12 shots and I asked her to followup with her primary physician for long-term decision about this. She has a peripheral neuropathy but the biggest problems have been cachexia and some difficulty with medication compliance. She denies fever, chills, swollen glands. She has a followup appointment with infectious disease.  02/28/13-72 yo F never smoker, followed for severe Cystic bronchiectasis/ COPD, with hx treated atypical AFB/ MAIC, occasional hemoptysis and GERD   She is willing to use portable oxygen now that she has a Designer, jewelleryportable concentrator. She never felt safe with compressed oxygen tanks. Otherwise  little has changed. She remains very weak with persistent cough which is sometimes productive of yellow or green sputum. No blood, fever or adenopathy. She desaturates easily with exertion but is comfortable at rest on room air. CXR 12/17/12 IMPRESSION:  Severe chronic cystic bronchiectasis with superimposed infection,  slightly progressed in the left midzone.  Original Report Authenticated By: Francene BoyersJames Maxwell, M.D.   Review of Systems-See HPI Constitutional:   + weight loss, No-night sweats, No- Fevers, chills, +fatigue, lassitude. HEENT:   No headaches,  Difficulty swallowing,  Tooth/dental problems,  Sore throat,                No sneezing, itching, ear ache, nasal congestion, post nasal drip,  CV:  No chest pain, orthopnea, PND, swelling in lower extremities, anasarca, +dizziness, No-palpitations GI  No heartburn, indigestion, abdominal pain, nausea, vomiting, Resp: +Shortness of breath with exertion. , no recent coughing up of blood. .  Some minimal wheezing.  Skin: no rash or lesions. GU:   MS:  + joint pain or swelling.   Psych:  No change in mood or affect. No depression + anxiety.  No memory loss.  Objective:   Physical Exam General- Alert, Oriented, Affect-appropriate, Distress- none acute; thin. Looks chronically ill, wheelchair, O2 2L / concentrator 93$ Skin- rash-none, lesions- none, excoriation- none. +Dusky hands and feet Lymphadenopathy- none Head- atraumatic            Eyes- Gross vision intact, PERRLA, conjunctivae clear secretions            Ears- Hearing, canals- normal            Nose- Clear, No-Septal dev, mucus, polyps, erosion, perforation             Throat- Mallampati II , mucosa clear , drainage- none, tonsils- atrophic Neck- flexible , trachea midline, no stridor , thyroid nl, carotid no bruit Chest - symmetrical excursion , unlabored           Heart/CV- + remains regular,rapid RRR ., no murmur , no gallop  , no rub, nl s1 s2                            JVD+  full , edema +1, stasis changes- none, varices- none           Lung-  + bilateral  Rhonchi- clearer than last visit, unlabored, cough- mild , dullness-none, rub- none           Chest wall-  Abd-  Br/ Gen/ Rectal- Not done, not indicated Extrem- cyanosis- none, clubbing, none, atrophy- none, strength- nl Neuro- grossly intact to observation             Patient ID: Stacy BernJean  Alphonzo Novak, female    DOB: May 23, 1941, 73 y.o.   MRN: [id number]  HPI 11/30/10-73 yo F never smoker, followed for bronchiectasis/ COPD, with hx treated atypical AFB/ MAIC, occasional hemoptysis and GERD.  Last here July 29, 2010 after normal flora on sputum cultures in October. Last antibiotic was augmentin in December. She questions if she should have had longer than 1 week treatment. Daily productive cough- yellow to light green. Shortness of breath with exertion across parking lot, up steps. . She does use Flutter occasionally. . No recent blood, fever, sweats or chest pain. Trial of Daliresp caused malaise.   03/01/11-73 yo F never smoker, followed for bronchiectasis/ COPD, with hx treated atypical AFB/ MAIC, occasional hemoptysis and GERD. No acute issues "just can't breathe and walk". She has avoided the weather extremes recently by staying in.  Cough always productive, greenish. Denies fever, sweat.  CXR- 11/30/10- reviewed with her- no definite new areas, but significant cystic bronchiectasis diffusely with some air fluid levels. PFT- 12/21/10- FEV1 1.02/ 49%; FEV1/FVC 0.54; no response to BD; TLC 0.77; DLCO 0.58    Severe obstructive disease, mild restriction.  She asks to defer formal oxygen assessment till she finishes spending summer [facility] with her grandchildren. She has less cough in last few days, doesn't feel she could leave sputum specimen, but would like script for brand-name levaquin, which works better.   05/05/11- 5 yo F never smoker, followed for bronchiectasis/ COPD, with hx treated atypical  AFB/ MAIC, occasional hemoptysis and GERD. We gave (she requested brand-name) levaquin to hold last visit. She felt she probably needed it because of pleuritic right chest pain that lasted 2-3 days, self limited. She isn't sure there was any change in her daily productive cough- usually white. Maybe some fever once. She waited because she had upset GI with a little nausea and diarrhea. Still holding the levaquin.   07/06/11-  35 yo F never smoker, followed for bronchiectasis/ COPD, with hx treated atypical AFB/ MAIC, occasional hemoptysis and GERD. Has had flu vaccine. Has not taken the Levaquin prescription we gave previously. She was taking maintenance Zithromax to suppress bronchitis. She stayed off it for 2 weeks because of GI upset with cramps, after successfully taking it for 6 weeks. She has no GI discomfort now. She is going to try resuming maintenance Zithromax, but adding a probiotic. She did not tolerate Daliresp.  11/07/11-  31 yo F never smoker, followed for bronchiectasis/ COPD, with hx treated atypical AFB/ MAIC, occasional hemoptysis and GERD. Still coughing a lot, often productive, with looser mucus. Had tried maintenance Zithromax until she got GI upset. Remains on chronic Nexium for GERD. Discussed her recent reports of cardiac conduction problems with macrolide antibiotics. (REC- next visit- look at HR, chemistry)  01/19/12- 47 yo F never smoker, followed for bronchiectasis/ COPD, with hx treated atypical AFB/ MAIC, occasional hemoptysis and GERD. Coughing up blood-bright red; would happen off and on, but only on May 30 with no repeat today , . SOB and wheezing has increased. Always has some cough which has not changed. No easy bruising or other bleeding. No fever. Previous hemoptysis 2 or 3 years ago at which time we stopped the baby aspirin.  03/19/12-  56 yo F never smoker, followed for bronchiectasis/ COPD, with hx treated atypical AFB/ MAIC, occasional hemoptysis and  GERD. Having increased SOB past 3 weeks; losing weight and unsure why; no energy.. Has lost 10 lbs since last visit. 140>130lbs. COPD assessment test (  CAT) score 32/40. She complains of weakness, aching joints, weight loss. 2 episodes where she passed mucus per rectum-not blood. Dry eyes, nose and mouth times one month. Bloated with eating-takes her breath. Burning she-asks B12 shot. Jittery. Has slept up in a chair for years because of chronic midthoracic back pain has not gone back to see Dr. Ricki MillerPang in a long time. She asks if all of this is a side effect of Nexium. We reviewed her radiology again. CXR 01/23/12-  IMPRESSION:  Stable chronic changes with scattered air-fluid levels, as  described above.  Original Report Authenticated By: Charline BillsSRIYESH KRISHNAN, M.D.   04/02/12- 73 yo F never smoker, followed for  Cystic bronchiectasis/ COPD, with hx treated atypical AFB/ MAIC, occasional hemoptysis and GERD. Post Hospital-8[facility]1-03[facility]27[facility]12 for dx bronchiectasis w/ acute exacerbation, COPD, anemia/ B12 deficiency, Failure to thrive.Treated vanc/ zithromax/ ceftazidime. Cultures all no growth. Severe diffuse cystic bronchiectasis. Had ID consult. ECHO 8/2- normal EF60%, unable to visualize for PA pressures. Treated B12 deficiency x 5 days, then outpat injection.  She has not re-established with [name]/ PCP and says there is not a current record there. Air conditioning broken, so [facility]. Eating better, not gaining weight. Weak. Thick white mucus in mouth. No fever, chills, blood, pain, nodes.   04/17/12- [name]/ INF.DIS. DISEASE, PULMONARY D/T MYCOBACTERIA - Johny SaxJeffrey Hatcher, MD 04/17/2012 12:24 PM Signed  She is not doing well based on her weight loss and probable recent exacerbation. I reviewed her CXR with her, there is not a huge difference vs her previous studies from 11-2010. She is offered to restart her MAI therapy but she wants "to get her stomach straightened out".  She has quit taking her prilosec/nexium and is relying on tums now.will get a repeat sputum AFB on her (she is given a specimen container and will return after she is able to provide a good specimen).  Needs improved nutrition and probably home O2 (i encouraged her multiple times to call Dr Maple HudsonYoung about this). We may need GI help with her stomach issues.  Will see her back on October 3.   04/30/12-  73 yo F never smoker, followed for severe Cystic bronchiectasis/ COPD, with hx treated atypical AFB/ MAIC, occasional hemoptysis and GERD. Unable to tell any difference with using O2 QHS-does not use O2 during the day. Increased SOB-not getting any better; Unable to drink much water. Minor epistaxis blamed on dryness from her oxygen. Son thinks/blood-tinged sputum occasionally, otherwise yellowish or greenish. She had stopped Nexium, blaming it for her poor appetite-it seemed to cause bloating. Instead she now uses TUMS and we discussed his meds. Complains of burning in her feet attributed by her to B12 deficiency. I again asked her to get back with her primary physician.  05/14/12- 73 yo F never smoker, followed for severe Cystic bronchiectasis/ COPD, with hx treated atypical AFB/ MAIC, occasional hemoptysis and GERD  Husband here Sputum cx 03/22/12- Nl flora. ACUTE VISIT: increased cough-yellow in color and Increased SOB-getting worse. Cough is chronically productive. In the last few days has felt more short of breath, nonspecific. Had flu vaccine. Using saline nasal spray for nasal dryness on home oxygen at 2 L. She is taking a liquid antacid instead of Nexium and admits reduced by mouth intake. Claims foods makes her bloated CXR 03/22/12- I reviewed the images with patient and husband IMPRESSION:  No significant change in the severe chronic changes as above.  Original Report Authenticated By:  Cyndie Chime, M.D.   07/30/12- 24 yo F never smoker, followed for severe Cystic bronchiectasis/ COPD, with hx  treated atypical AFB/ MAIC, occasional hemoptysis and GERD   FOLLOWS FOR: not able to eat or drink enough; sores in nose; not wearing O2 every night due to bumps in nose, feels weak and like she needs fluids. Has lost 20 lbs over past year. Always feels hot at first to keep her home about 74 which is too cold for family. Still spending most of her time down at [facility]. Always productive cough without change. Exertion makes her cough more productive. She will see Dr. Hatcher/Infectious Disease again in January. Continues oxygen 2 L per minute at night and as needed in the day. Never took last prescription for Augmentin in September because she lost the prescription. Wants to continue B12 shots. No numbness in her feet.  09/11/12- 51 yo F never smoker, followed for severe Cystic bronchiectasis/ COPD, with hx treated atypical AFB/ MAIC, occasional hemoptysis and GERD   ACUTE: 86% on RA on arrival-- Dr. Ninetta Lights ID saw her today and thought she should be seen, heard "rubbing" in her lungs today--diff breathing worsening over last "couple" weeks w even small activities-- dry cough, denies any wheezing or chest tightness.  Exercise tolerance always poor. No distinct event but may be more DOE in last 2 weeks. No change in chronic cough, sometimes productive of yellow green, occasional small streak of blood. No fever, nodes, palpitation or chest pain. Feet swell some, dependent. Very weak. Dyspnea and weakness caused by her lung disease interfere with routine activities- dressing, grooming, ambulating through home. She needs to be able to sit frequently, and needs assistance for sustained ambulation,. She should have a light wheel chair. I don't think she is strong enough to manage a regular weight chair by herself. .  Weight stable 121-123 since October. She didn't take augmentin when given because she says she wasn't taking enough fluids for it- discussed. Has O2 concentrator for sleep but tends to leave it  off- prongs bother nose.   10/29/12- 65 yo F never smoker, followed for severe Cystic bronchiectasis/ COPD, with hx treated atypical AFB/ MAIC, occasional hemoptysis and GERD FOLLOWS FOR: Pt states she has not recently been wearing her O2 at night as she feels it makes her breathing worse during the day; States she has noticed increased SOB as well. 85% RA on arrival-took approximately 2 minutes to reach 90% RA. She finally took the antibiotic we had given her. She stays short of breath but not wearing oxygen. Her portable tank is too heavy for her. We discussed alternatives including small concentrators. She has generally sought to minimize intervention and treatment. 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  Numerous air-fluid levels appear progressive and could be due to  infection.  Original Report Authenticated By: Rudie Meyer, M.D.  11/29/12- 70 yo F never smoker, followed for severe Cystic bronchiectasis/ COPD, with hx treated atypical AFB/ MAIC, occasional hemoptysis and GERD    Husband is here. FOLLOWS FOR:Pt feels she gets worse when on O2( 2L/ Advanced); unsure of how her breathing is really doing We discussed her oxygen. She says she is "afraid" of oxygen tanks and uncomfortable working with them. We discussed portable concentrators. Her husband is aware that she frequently is not compliant with therapies.  12/11/12- 55 yo F never smoker, followed for severe Cystic bronchiectasis/ COPD, with hx treated atypical AFB/ MAIC, occasional hemoptysis and GERD  Husband is here. Acute work-in today for increased cough and shortness of breath. She wanted a nebulizer treatment and Depo-Medrol. We discussed her B12 shots and I asked her to followup with her primary physician for long-term decision about this. She has a peripheral neuropathy but the biggest problems have been cachexia and some difficulty with medication compliance. She denies fever, chills,  swollen glands. She has a followup appointment with infectious disease.  02/28/13-72 yo F never smoker, followed for severe Cystic bronchiectasis/ COPD, with hx treated atypical AFB/ MAIC, occasional hemoptysis and GERD   She is willing to use portable oxygen now that she has a Designer, jewelleryportable concentrator. She never felt safe with compressed oxygen tanks. Otherwise little has changed. She remains very weak with persistent cough which is sometimes productive of yellow or green sputum. No blood, fever or adenopathy. She desaturates easily with exertion but is comfortable at rest on room air. CXR 12/17/12 IMPRESSION:  Severe chronic cystic bronchiectasis with superimposed infection,  slightly progressed in the left midzone.  Original Report Authenticated By: Francene BoyersJames Maxwell, M.D.  06/25/13- 73 yo F never smoker, followed for severe Cystic bronchiectasis/ COPD, with hx treated atypical AFB/ MAIC, occasional hemoptysis and GERD    Husband heer FOLLOWS FOR: Pt states her breathing has gotten worse; was seen by Dr Ninetta LightsHatcher yesterday and her O2 levels were dropping. Told to follow up today. She says she desaturated while coughing at Dr Moshe CiproHatcher's. More SOB/ DOE 3 weeks. Chronic cough not changed- brownish, no blood, fever or chills. She tries using Flutter but it is not helping enough. We discussed pneumatic VEST. O2 2-3L/Min Imogen portable concentrator.   11/18/13- 73 yo F never smoker, followed for severe Cystic bronchiectasis/ COPD, with hx treated atypical AFB/ MAIC, occasional hemoptysis and GERD    Husband here  11/18/13- 73 yo F never smoker, followed for severe Cystic bronchiectasis/ COPD, with hx treated atypical AFB/ MAIC, occasional hemoptysis and GERD    Husband here FOLLOWS FOR: Pt c/o an increase in SOB x 2 weeks. Pt has productive cough with brown mucous x years. Pt was 84% when waiting in lobby, pt stated it was d/t coughing, with rest pt O2 increase to 92% Recent viral system acute infection started  about 2 weeks ago with increased cough and shortness of breath. Sputum white or dark with scant blood streak. Doesn't know if she had fever. Mild GI upset. Continues oxygen O2 2-3L/Min Inogen portable concentrator  01/02/14- 73 yo F never smoker, followed for severe Cystic bronchiectasis/ COPD, with hx treated atypical AFB/ MAIC, occasional hemoptysis and GERD    Husband here ACUTE VISIT: increased SOB and wheezing x 1 week at least. Cough-productive-yellow to brown in color. Stays hot as well. Pt has feelings of passing out at times. O2 2-3L Inogen portable concentrator  01/21/2014 Post Hospital follow up  Patient presents for a post hospital followup. Patient was admitted, may 19 2 may 28th for an acute exacerbation of bronchiectasis with acute on chronic respiratory failure She was treated with aggressive IV antibiotics, nebulized bronchodilators.  She did have a mild decompensation of diastolic heart failure requiring gentle diuresis.  Reports some good days and bad since discharge.   Does report some occasional dyspnea, wheezing, tightness, cough. Encouraged on VEST compliance. (not using)  On 2-3 l/m Oxygen .  Was started on Atrovent and Xopenex neb at discharge.   03/16/14- 73 yo F never smoker, followed for severe Cystic bronchiectasis/ COPD, with hx treated atypical AFB/ MAIC, occasional hemoptysis  and GERD    Husband here FOLLOWS FOR:  Still having sob, cough with mucus clear.  Reports have great difficulty catching her breath. Sometimes coughs when she eats. Feeling weak and easily tired. Wakes with mild queasy feeling/ malaise- blames cardizem given to slow heart rate. Liked liquid diet supplement. Continues interval followup with Infectious Disease. CXR 01/02/14 IMPRESSION:  Again noted patchy opacification of the lung parenchyma bilaterally  with bronchiectasis and multiple cystic changes with air-fluid  levels without significant change from prior exam. No definite  superimposed  confluent segmental infiltrate. No pulmonary edema.  Electronically Signed  By: Natasha Mead M.D.  On: 01/02/2014 16:21  Review of Systems-See HPI Constitutional:   No-weight loss, No-night sweats, No- Fevers, chills, +fatigue, lassitude. HEENT:   No headaches,  Difficulty swallowing,  Tooth/dental problems,  Sore throat,                No sneezing, itching, ear ache, nasal congestion, post nasal drip,  CV:  No chest pain, orthopnea, PND, swelling in lower extremities, anasarca,  No-palpitations GI  No heartburn, indigestion, abdominal pain, nausea, vomiting, Resp: +Shortness of breath with exertion.  Skin: no rash or lesions. GU:   MS:  + joint pain or swelling , wheelchair Psych:  No change in mood or affect. No depression + anxiety.  No memory loss.  Objective:   Physical Exam General- Alert, Oriented, Affect-appropriate, Distress- none acute; thin. Looks chronically ill,              +wheelchair, O2 2L  Skin- rash-none, lesions- none, excoriation- none.  Lymphadenopathy- none Head- atraumatic            Eyes- Gross vision intact, PERRLA, conjunctivae clear secretions            Ears- Hearing, canals- normal            Nose- Clear, No-Septal dev, mucus, polyps, erosion, perforation             Throat- Mallampati II , mucosa clear , drainage- none, tonsils- atrophic Neck- flexible , trachea midline, no stridor , thyroid nl, carotid no bruit Chest - symmetrical excursion , unlabored           Heart/CV- + remains regular,rapid RRR ., no murmur , no gallop  , no rub, nl s1 s2                            JVD+ full , edema +trace, stasis changes- none, varices- none           Lung-  + bilateral  Rhonchi esp RUL unlabored, cough- mild , dullness-none, rub- none           Chest wall-  Abd-  Br/ Gen/ Rectal- Not done, not indicated Extrem- cyanosis- none, clubbing, none, atrophy- none, strength- nl Neuro- grossly intact to observation

## 2014-03-16 NOTE — Patient Instructions (Signed)
Depo 1580  Ok to try adding a B12 pill otc

## 2014-03-19 ENCOUNTER — Ambulatory Visit (INDEPENDENT_AMBULATORY_CARE_PROVIDER_SITE_OTHER): Payer: Medicare Other

## 2014-03-19 DIAGNOSIS — D519 Vitamin B12 deficiency anemia, unspecified: Secondary | ICD-10-CM

## 2014-03-19 DIAGNOSIS — D518 Other vitamin B12 deficiency anemias: Secondary | ICD-10-CM

## 2014-03-19 MED ORDER — CYANOCOBALAMIN 1000 MCG/ML IJ SOLN
1000.0000 ug | Freq: Once | INTRAMUSCULAR | Status: AC
  Administered 2014-03-19: 1000 ug via INTRAMUSCULAR

## 2014-03-20 ENCOUNTER — Ambulatory Visit (INDEPENDENT_AMBULATORY_CARE_PROVIDER_SITE_OTHER): Payer: Medicare Other | Admitting: Infectious Diseases

## 2014-03-20 ENCOUNTER — Encounter: Payer: Self-pay | Admitting: Infectious Diseases

## 2014-03-20 VITALS — BP 122/69 | HR 97 | Wt 112.0 lb

## 2014-03-20 DIAGNOSIS — J9620 Acute and chronic respiratory failure, unspecified whether with hypoxia or hypercapnia: Secondary | ICD-10-CM

## 2014-03-20 DIAGNOSIS — J9621 Acute and chronic respiratory failure with hypoxia: Secondary | ICD-10-CM

## 2014-03-20 DIAGNOSIS — J471 Bronchiectasis with (acute) exacerbation: Secondary | ICD-10-CM

## 2014-03-20 DIAGNOSIS — A310 Pulmonary mycobacterial infection: Secondary | ICD-10-CM

## 2014-03-20 DIAGNOSIS — J441 Chronic obstructive pulmonary disease with (acute) exacerbation: Secondary | ICD-10-CM

## 2014-03-20 DIAGNOSIS — R0902 Hypoxemia: Secondary | ICD-10-CM

## 2014-03-20 MED ORDER — AZITHROMYCIN 250 MG PO TABS: ORAL_TABLET | ORAL | Status: DC

## 2014-03-20 NOTE — Assessment & Plan Note (Signed)
I agree with her that I do not believe that she could tolerate taking the 3 drug regimen for MAI at this point.  Will recheck her sputum. Will see her back in 4-6 weeks.

## 2014-03-20 NOTE — Progress Notes (Signed)
   Subjective:    Patient ID: Stacy Novak, female    DOB: 01/21/1941, 73 y.o.   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 She states she was in hospital in May with CHF.   She recently saw Dr Maple HudsonYoung and was given shot of solumedrol (03-16-14).  Today states that she feels like her breathing is better (she is hypoxic with getting up on scale).  States she has been too tired to make food. States she has been coughing so much that it takes her forever to eat anything. As well, she is tired from cutting up her food. Appetite is good.  No fever or chills but does feel hot flashes occasionally.  Has been using nebulizer but not TID (I don't have time to wash the machine that often).  Has been having episodes of diastolic pressures into the 40-50 range. Also, O2 level takes a long time to get back up once she exerts herself.  Has been off MAI therapy for awhile. She states she is not sure she could take it right now.  Still has cough, volume of sputum is about the same. Is chewing gum to help her with the coughing ("I hate chewing gum").    Review of Systems     Objective:   Physical Exam  Constitutional: She appears cachectic. No distress.  HENT:  Mouth/Throat: No oropharyngeal exudate.  Eyes: EOM are normal.  Neck: Neck supple.  Cardiovascular: Normal rate, regular rhythm and normal heart sounds.   Pulmonary/Chest: Accessory muscle usage present. She has rhonchi.  Abdominal: Soft. Bowel sounds are normal. There is no tenderness.  Musculoskeletal: She exhibits edema.          Assessment & Plan:

## 2014-03-20 NOTE — Assessment & Plan Note (Signed)
Will ask her to f/u with Dr [name], Dr Maple HudsonYoung. Again, advance directives planning. I mentioned this to her.

## 2014-03-20 NOTE — Assessment & Plan Note (Addendum)
Will give her azithto (she states doxy course in May did not help) to see if we can improve her sputum production.

## 2014-03-20 NOTE — Assessment & Plan Note (Addendum)
I am not sure her course going forward. Would consider her advance directive planning, at some point palliative care eval. I brought this up with her (further hospitalizations, intubation, DNR/DNI) and she "states that she wants to live". She did not want to discuss this.  Would ask her PCP to check in with her as well.

## 2014-03-24 ENCOUNTER — Ambulatory Visit: Payer: Medicare Other | Admitting: Internal Medicine

## 2014-04-15 ENCOUNTER — Ambulatory Visit: Payer: Medicare Other | Admitting: Internal Medicine

## 2014-04-16 ENCOUNTER — Ambulatory Visit (INDEPENDENT_AMBULATORY_CARE_PROVIDER_SITE_OTHER): Payer: Medicare Other | Admitting: *Deleted

## 2014-04-16 ENCOUNTER — Telehealth: Payer: Self-pay | Admitting: Internal Medicine

## 2014-04-16 DIAGNOSIS — D519 Vitamin B12 deficiency anemia, unspecified: Secondary | ICD-10-CM

## 2014-04-16 DIAGNOSIS — D518 Other vitamin B12 deficiency anemias: Secondary | ICD-10-CM

## 2014-04-16 MED ORDER — CYANOCOBALAMIN 1000 MCG/ML IJ SOLN: 1000.0000 ug | Freq: Once | INTRAMUSCULAR | Status: DC

## 2014-04-16 MED ORDER — CYANOCOBALAMIN 1000 MCG/ML IJ SOLN
1000.0000 ug | Freq: Once | INTRAMUSCULAR | Status: AC
  Administered 2014-04-16: 1000 ug via INTRAMUSCULAR

## 2014-04-16 NOTE — Telephone Encounter (Signed)
Pt 's spouse states pt is home, on the oxygen machine and doing fine.

## 2014-04-20 ENCOUNTER — Ambulatory Visit (INDEPENDENT_AMBULATORY_CARE_PROVIDER_SITE_OTHER): Payer: Medicare Other | Admitting: Infectious Diseases

## 2014-04-20 ENCOUNTER — Encounter: Payer: Self-pay | Admitting: Infectious Diseases

## 2014-04-20 VITALS — BP 113/66 | HR 109 | Temp 98.6°F

## 2014-04-20 DIAGNOSIS — A310 Pulmonary mycobacterial infection: Secondary | ICD-10-CM

## 2014-04-20 NOTE — Progress Notes (Signed)
   Subjective:    Patient ID: Stacy Novak, female    DOB: 1941-05-14, 73 y.o.   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 She states she was in hospital in May 2015 with CHF.  She had ID f/u 03-20-14 and had repeat sputum Cx that was upper respiratory flora. She was given rx for azithro but she did not take as she had developed a rash prior to taking medication.  Today she feels about the same- continued sputum production, fatigue. No fever or chills. States a lot of the time she is burning up/hot all time.  No change in color or volume of sputum.  Review of Systems  Constitutional: Negative for fever, chills and unexpected weight change.  Respiratory: Positive for cough and shortness of breath.   Cardiovascular: Negative for leg swelling.       Objective:   Physical Exam  Constitutional: She appears cachectic.  HENT:  Mouth/Throat: No oropharyngeal exudate.  Eyes: EOM are normal. Pupils are equal, round, and reactive to light.  Neck: Neck supple.  Cardiovascular: Regular rhythm.  Tachycardia present.   Pulmonary/Chest: No respiratory distress. She has wheezes. She has rhonchi.  Abdominal: Soft. Bowel sounds are normal. There is no tenderness.  Musculoskeletal: She exhibits no edema.  Lymphadenopathy:    She has no cervical adenopathy.          Assessment & Plan:

## 2014-04-20 NOTE — Assessment & Plan Note (Signed)
I believe that she could not tolerate 3 drug therapy for 12 months now. I offered to give her a short course of doxy/levaquin or the azithro which she did not want to take. She wishes to give a sputum sample when she is able to produce.  I again brought up planing for DNI/DNR if she becomes more sick. "i've got to try to keep going", "I always want to have hope".

## 2014-05-04 ENCOUNTER — Ambulatory Visit (INDEPENDENT_AMBULATORY_CARE_PROVIDER_SITE_OTHER): Payer: Medicare Other | Admitting: Internal Medicine

## 2014-05-04 ENCOUNTER — Encounter: Payer: Self-pay | Admitting: Internal Medicine

## 2014-05-04 VITALS — BP 128/60 | HR 101 | Temp 98.2°F | Ht 65.5 in | Wt 118.5 lb

## 2014-05-04 DIAGNOSIS — D518 Other vitamin B12 deficiency anemias: Secondary | ICD-10-CM

## 2014-05-04 DIAGNOSIS — J9620 Acute and chronic respiratory failure, unspecified whether with hypoxia or hypercapnia: Secondary | ICD-10-CM

## 2014-05-04 DIAGNOSIS — I10 Essential (primary) hypertension: Secondary | ICD-10-CM

## 2014-05-04 DIAGNOSIS — D519 Vitamin B12 deficiency anemia, unspecified: Secondary | ICD-10-CM

## 2014-05-04 DIAGNOSIS — J9621 Acute and chronic respiratory failure with hypoxia: Secondary | ICD-10-CM

## 2014-05-04 DIAGNOSIS — R0902 Hypoxemia: Secondary | ICD-10-CM

## 2014-05-04 MED ORDER — CYANOCOBALAMIN 1000 MCG/ML IJ SOLN
1000.0000 ug | Freq: Once | INTRAMUSCULAR | Status: AC
  Administered 2014-05-04: 1000 ug via INTRAMUSCULAR

## 2014-05-04 NOTE — Assessment & Plan Note (Signed)
Chronic hypoxic respiratory failure related to same. History of MAI infection. Acute for exacerbation of bronchiectasis with hospitalization May 2015 reviewed Follows with pulmonary related to same but has continued to clinically decline despite medications -  on oxygen therapy but only 2LPM (reviewed recommended for 3LPM last month). Daily dyspnea on exertion symptoms, reviewed last echo (normal LVEF 08/2013) No medication changes recommended at this time 

## 2014-05-04 NOTE — Progress Notes (Signed)
Pre visit review using our clinic review tool, if applicable. No additional management support is needed unless otherwise documented below in the visit note. 

## 2014-05-04 NOTE — Assessment & Plan Note (Signed)
BP Readings from Last 3 Encounters:  05/04/14 128/60  04/20/14 113/66  03/20/14 122/69   Previously on hydrochlorothiazide therapy, stopped early 2014 because blood pressure well controlled, Because of tachycardia and HTN, Dilt initiated 12/2013 - continue same Discussed potential of increase dose to control HR>130s -but encouraged compliance with pulm recommendations prior to treating reactive tachycardia

## 2014-05-04 NOTE — Patient Instructions (Signed)
It was good to see you today.  We have reviewed your prior records including labs and tests today  Medications reviewed and updated, no changes recommended at this time. B12 shot today - once monthly is covered Try OTC B12 "sublingual" (under tongue) once daily  Please schedule followup in 6 months, call sooner if problems.

## 2014-05-04 NOTE — Assessment & Plan Note (Signed)
Patient contributes prior use of PPI and subsequent B12 deficiency as cause of weakness rather than advanced pulmonary disease Continue B12 IM supplementation as ongoing

## 2014-05-04 NOTE — Progress Notes (Signed)
Subjective:    Patient ID: Stacy Novak, female    DOB: 1940/10/10, 73 y.o.   MRN: [id number]  HPI  Patient is here for follow up  Reviewed chronic medical issues and interval medical events  Past Medical History  Diagnosis Date  . 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 COPD (chronic obstructive pulmonary disease)   . Bronchiectasis   . Hypercalcemia   . GERD (gastroesophageal reflux disease)   . Hypertension   . Asthma     hx of PNA  . Adult failure to thrive 08/06/2012    20 pound weight loss one year, anorexia, pernicious anemia   . 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 CXR 01/23/12- again shows essentially stable severe fibro--cystic scarring with bronchiectasis and air fluid levels, but no obvious progression. Desat to 86% with exertion walking room air- order portable O2   . GERD   . Pernicious anemia     Review of Systems  Constitutional: Positive for fatigue. Negative for fever and unexpected weight change.  Respiratory: Positive for cough and shortness of breath. Negative for chest tightness.   Cardiovascular: Positive for palpitations. Negative for chest pain. Leg swelling: occ L ankle edema.  Neurological: Positive for numbness (R>L foot (little toe)).       Objective:   Physical Exam  BP 128/60  Pulse 101  Temp(Src) 98.2 F (36.8 C) (Oral)  Ht 5' 5.5" (1.664 m)  Wt 118 lb 8 oz (53.751 kg)  BMI 19.41 kg/m2  SpO2 91% Wt Readings from Last 3 Encounters:  05/04/14 118 lb 8 oz (53.751 kg)  03/20/14 112 lb (50.803 kg)  03/16/14 119 lb (53.978 kg)   Constitutional: She is cachetic, frail - No distress. son at side Neck: Normal range of motion. Neck supple. No JVD present. No thyromegaly present.  Cardiovascular: Normal rate, regular rhythm and normal heart sounds.  No murmur heard. trace L>R ankle edema (chronic). Pulmonary/Chest: Effort normal and breath  sounds diminished. no respiratory distress. She has no wheezes.  Psychiatric: She has a normal mood and affect. Her behavior is normal. Judgment and thought content normal.   Lab Results  Component Value Date   WBC 3.9* 01/07/2014   HGB 12.5 01/07/2014   HCT 41.6 01/07/2014   PLT 240 01/07/2014   GLUCOSE 90 01/12/2014   CHOL 124 10/17/2013   TRIG 55.0 10/17/2013   HDL 54.80 10/17/2013   LDLCALC 58 10/17/2013   ALT 10 10/17/2013   AST 16 10/17/2013   NA 138 01/12/2014   K 4.1 01/12/2014   CL 100 01/12/2014   CREATININE 0.38* 01/13/2014   BUN 17 01/12/2014   CO2 34* 01/12/2014   TSH 1.29 10/17/2013   INR 1.1* 01/19/2012   HGBA1C 6.3 02/18/2013    Dg Chest Portable 1 View  01/06/2014   CLINICAL DATA:  Shortness of breath for 5 days, history asthma, hypertension, bronchiectasis, COPD, mycobacterial disease  EXAM: PORTABLE CHEST - 1 VIEW  COMPARISON:  Portable exam 1711 hr compared to 01/02/2014  Correlation:  CT chest 03/01/2010  FINDINGS: Normal heart size and mediastinal contours.  Numerous cystic foci again identified throughout both lungs primarily in the upper lobes.  Associated bronchiectasis better identified by prior CT.  Multiple areas of nodularity are again identified.  Few scattered air-fluid levels are seen within the cystic regions.  No definite superimposed acute infiltrate, pleural effusion, or pneumothorax.  Bones appear  demineralized.  IMPRESSION: Extensive cystic changes and bronchiectasis involving primarily the upper lobes with few scattered air-fluid levels, appearance similar to previous exam.  No definite superimposed acute infiltrate identified.   Electronically Signed   By: Ulyses Southward M.D.   On: 01/06/2014 17:48       Assessment & Plan:   Problem List Items Addressed This Visit   Acute on chronic respiratory failure     Chronic hypoxic respiratory failure related to same. History of MAI infection. Acute for exacerbation of bronchiectasis with hospitalization May 2015  reviewed Follows with pulmonary related to same but has continued to clinically decline despite medications -  on oxygen therapy but only 2LPM (reviewed recommended for 3LPM last month). Daily dyspnea on exertion symptoms, reviewed last echo (normal LVEF 08/2013) No medication changes recommended at this time    B12 deficiency anemia - Primary     Patient contributes prior use of PPI and subsequent B12 deficiency as cause of weakness rather than advanced pulmonary disease Continue B12 IM supplementation as ongoing    Relevant Medications      cyanocobalamin ((VITAMIN B-12)) injection 1,000 mcg (Completed)   HTN (hypertension) (Chronic)      BP Readings from Last 3 Encounters:  05/04/14 128/60  04/20/14 113/66  03/20/14 122/69   Previously on hydrochlorothiazide therapy, stopped early 2014 because blood pressure well controlled, Because of tachycardia and HTN, Dilt initiated 12/2013 - continue same Discussed potential of increase dose to control HR>130s -but encouraged compliance with pulm recommendations prior to treating reactive tachycardia

## 2014-05-14 ENCOUNTER — Ambulatory Visit (INDEPENDENT_AMBULATORY_CARE_PROVIDER_SITE_OTHER): Payer: Medicare Other | Admitting: Internal Medicine

## 2014-05-14 DIAGNOSIS — E538 Deficiency of other specified B group vitamins: Secondary | ICD-10-CM | POA: Diagnosis not present

## 2014-05-14 MED ORDER — CYANOCOBALAMIN 1000 MCG/ML IJ SOLN
1000.0000 ug | Freq: Once | INTRAMUSCULAR | Status: AC
  Administered 2014-05-14: 1000 ug via INTRAMUSCULAR

## 2014-05-17 ENCOUNTER — Encounter (HOSPITAL_COMMUNITY): Payer: Self-pay | Admitting: Emergency Medicine

## 2014-05-17 ENCOUNTER — Inpatient Hospital Stay (HOSPITAL_COMMUNITY)
Admission: EM | Admit: 2014-05-17 | Discharge: 2014-05-23 | DRG: 190 | Disposition: A | Discharge status: Home Health | Payer: Medicare Other | Attending: Internal Medicine | Admitting: Internal Medicine

## 2014-05-17 ENCOUNTER — Emergency Department (HOSPITAL_COMMUNITY): Payer: Medicare Other

## 2014-05-17 DIAGNOSIS — E43 Unspecified severe protein-calorie malnutrition: Secondary | ICD-10-CM

## 2014-05-17 DIAGNOSIS — J9621 Acute and chronic respiratory failure with hypoxia: Secondary | ICD-10-CM | POA: Diagnosis present

## 2014-05-17 DIAGNOSIS — J471 Bronchiectasis with (acute) exacerbation: Principal | ICD-10-CM

## 2014-05-17 DIAGNOSIS — J189 Pneumonia, unspecified organism: Secondary | ICD-10-CM | POA: Diagnosis present

## 2014-05-17 DIAGNOSIS — J984 Other disorders of lung: Secondary | ICD-10-CM | POA: Diagnosis not present

## 2014-05-17 DIAGNOSIS — E538 Deficiency of other specified B group vitamins: Secondary | ICD-10-CM | POA: Diagnosis present

## 2014-05-17 DIAGNOSIS — I10 Essential (primary) hypertension: Secondary | ICD-10-CM

## 2014-05-17 DIAGNOSIS — J9620 Acute and chronic respiratory failure, unspecified whether with hypoxia or hypercapnia: Secondary | ICD-10-CM | POA: Diagnosis present

## 2014-05-17 DIAGNOSIS — R627 Adult failure to thrive: Secondary | ICD-10-CM | POA: Diagnosis present

## 2014-05-17 DIAGNOSIS — I272 Other secondary pulmonary hypertension: Secondary | ICD-10-CM | POA: Diagnosis present

## 2014-05-17 DIAGNOSIS — R0902 Hypoxemia: Secondary | ICD-10-CM | POA: Insufficient documentation

## 2014-05-17 DIAGNOSIS — R609 Edema, unspecified: Secondary | ICD-10-CM

## 2014-05-17 DIAGNOSIS — J441 Chronic obstructive pulmonary disease with (acute) exacerbation: Secondary | ICD-10-CM

## 2014-05-17 DIAGNOSIS — K219 Gastro-esophageal reflux disease without esophagitis: Secondary | ICD-10-CM | POA: Diagnosis present

## 2014-05-17 DIAGNOSIS — Z9981 Dependence on supplemental oxygen: Secondary | ICD-10-CM | POA: Diagnosis not present

## 2014-05-17 DIAGNOSIS — D518 Other vitamin B12 deficiency anemias: Secondary | ICD-10-CM

## 2014-05-17 DIAGNOSIS — D510 Vitamin B12 deficiency anemia due to intrinsic factor deficiency: Secondary | ICD-10-CM

## 2014-05-17 DIAGNOSIS — Z681 Body mass index (BMI) 19 or less, adult: Secondary | ICD-10-CM | POA: Diagnosis not present

## 2014-05-17 DIAGNOSIS — A310 Pulmonary mycobacterial infection: Secondary | ICD-10-CM

## 2014-05-17 DIAGNOSIS — D519 Vitamin B12 deficiency anemia, unspecified: Secondary | ICD-10-CM

## 2014-05-17 DIAGNOSIS — J479 Bronchiectasis, uncomplicated: Secondary | ICD-10-CM

## 2014-05-17 LAB — CBC WITH DIFFERENTIAL/PLATELET
BASOS ABS: 0 10*3/uL (ref 0.0–0.1)
Basophils Relative: 0 % (ref 0–1)
EOS ABS: 0.1 10*3/uL (ref 0.0–0.7)
Eosinophils Relative: 1 % (ref 0–5)
HEMATOCRIT: 42.5 % (ref 36.0–46.0)
Hemoglobin: 13.4 g/dL (ref 12.0–15.0)
Lymphocytes Relative: 17 % (ref 12–46)
Lymphs Abs: 1.3 10*3/uL (ref 0.7–4.0)
MCH: 27.9 pg (ref 26.0–34.0)
MCHC: 31.5 g/dL (ref 30.0–36.0)
MCV: 88.4 fL (ref 78.0–100.0)
MONO ABS: 0.6 10*3/uL (ref 0.1–1.0)
Monocytes Relative: 7 % (ref 3–12)
Neutro Abs: 5.6 10*3/uL (ref 1.7–7.7)
Neutrophils Relative %: 75 % (ref 43–77)
Platelets: 239 10*3/uL (ref 150–400)
RBC: 4.81 MIL/uL (ref 3.87–5.11)
RDW: 13.4 % (ref 11.5–15.5)
WBC: 7.5 10*3/uL (ref 4.0–10.5)

## 2014-05-17 LAB — BASIC METABOLIC PANEL
Anion gap: 8 (ref 5–15)
BUN: 14 mg/dL (ref 6–23)
CO2: 36 mEq/L — ABNORMAL HIGH (ref 19–32)
CREATININE: 0.39 mg/dL — AB (ref 0.50–1.10)
Calcium: 9 mg/dL (ref 8.4–10.5)
Chloride: 94 mEq/L — ABNORMAL LOW (ref 96–112)
GFR calc Af Amer: >90 mL/min (ref >90)
GLUCOSE: 96 mg/dL (ref 70–99)
Potassium: 4.4 mEq/L (ref 3.7–5.3)
SODIUM: 138 meq/L (ref 137–147)

## 2014-05-17 LAB — PRO B NATRIURETIC PEPTIDE: PRO B NATRI PEPTIDE: 832.1 pg/mL — AB (ref 0–450)

## 2014-05-17 MED ORDER — ACETAMINOPHEN 650 MG RE SUPP: 650.0000 mg | Freq: Four times a day (QID) | RECTAL | Status: DC | PRN

## 2014-05-17 MED ORDER — DILTIAZEM HCL ER COATED BEADS 120 MG PO CP24
120.0000 mg | ORAL_CAPSULE | Freq: Every day | ORAL | Status: DC
  Administered 2014-05-18 – 2014-05-22 (×5): 120 mg via ORAL
  Filled 2014-05-17 (×6): qty 1

## 2014-05-17 MED ORDER — LEVALBUTEROL HCL 1.25 MG/3ML IN NEBU
1.2500 mg | INHALATION_SOLUTION | Freq: Once | RESPIRATORY_TRACT | Status: AC
  Administered 2014-05-17: 1.25 mg via RESPIRATORY_TRACT
  Filled 2014-05-17: qty 3

## 2014-05-17 MED ORDER — SODIUM CHLORIDE 0.9 % IV SOLN: 250.0000 mL | INTRAVENOUS | Status: DC | PRN

## 2014-05-17 MED ORDER — GUAIFENESIN ER 600 MG PO TB12
1200.0000 mg | ORAL_TABLET | Freq: Two times a day (BID) | ORAL | Status: DC
  Administered 2014-05-20: 1200 mg via ORAL
  Filled 2014-05-17 (×15): qty 2

## 2014-05-17 MED ORDER — LEVALBUTEROL HCL 0.63 MG/3ML IN NEBU
0.6300 mg | INHALATION_SOLUTION | Freq: Four times a day (QID) | RESPIRATORY_TRACT | Status: DC
  Administered 2014-05-18: 0.63 mg via RESPIRATORY_TRACT
  Filled 2014-05-17 (×2): qty 3

## 2014-05-17 MED ORDER — ACETAMINOPHEN 325 MG PO TABS: 650.0000 mg | ORAL_TABLET | Freq: Four times a day (QID) | ORAL | Status: DC | PRN

## 2014-05-17 MED ORDER — CALCIUM CARBONATE ANTACID 500 MG PO CHEW: 1.0000 | CHEWABLE_TABLET | Freq: Every day | ORAL | Status: DC | PRN

## 2014-05-17 MED ORDER — METHYLPREDNISOLONE SODIUM SUCC 125 MG IJ SOLR
125.0000 mg | Freq: Once | INTRAMUSCULAR | Status: AC
  Administered 2014-05-17: 125 mg via INTRAVENOUS
  Filled 2014-05-17: qty 2

## 2014-05-17 MED ORDER — AZITHROMYCIN 500 MG PO TABS
500.0000 mg | ORAL_TABLET | Freq: Every day | ORAL | Status: DC
  Administered 2014-05-19 – 2014-05-23 (×3): 500 mg via ORAL
  Filled 2014-05-17 (×4): qty 1
  Filled 2014-05-17: qty 2
  Filled 2014-05-17 (×3): qty 1

## 2014-05-17 MED ORDER — ONDANSETRON HCL 4 MG PO TABS: 4.0000 mg | ORAL_TABLET | Freq: Four times a day (QID) | ORAL | Status: DC | PRN

## 2014-05-17 MED ORDER — ONDANSETRON HCL 4 MG/2ML IJ SOLN: 4.0000 mg | Freq: Four times a day (QID) | INTRAMUSCULAR | Status: DC | PRN

## 2014-05-17 MED ORDER — SODIUM CHLORIDE 0.9 % IJ SOLN: 3.0000 mL | INTRAMUSCULAR | Status: DC | PRN

## 2014-05-17 MED ORDER — IPRATROPIUM BROMIDE 0.02 % IN SOLN
0.5000 mg | Freq: Once | RESPIRATORY_TRACT | Status: AC
  Administered 2014-05-17: 0.5 mg via RESPIRATORY_TRACT
  Filled 2014-05-17: qty 2.5

## 2014-05-17 MED ORDER — METHYLPREDNISOLONE SODIUM SUCC 125 MG IJ SOLR
125.0000 mg | Freq: Three times a day (TID) | INTRAMUSCULAR | Status: DC
  Administered 2014-05-18 – 2014-05-19 (×4): 125 mg via INTRAVENOUS
  Filled 2014-05-17 (×9): qty 2

## 2014-05-17 MED ORDER — IPRATROPIUM BROMIDE 0.02 % IN SOLN
0.5000 mg | Freq: Four times a day (QID) | RESPIRATORY_TRACT | Status: DC
  Administered 2014-05-18: 0.5 mg via RESPIRATORY_TRACT
  Filled 2014-05-17: qty 2.5

## 2014-05-17 MED ORDER — HYDROCODONE-ACETAMINOPHEN 5-325 MG PO TABS: 1.0000 | ORAL_TABLET | ORAL | Status: DC | PRN

## 2014-05-17 MED ORDER — SODIUM CHLORIDE 0.9 % IJ SOLN
3.0000 mL | Freq: Two times a day (BID) | INTRAMUSCULAR | Status: DC
  Administered 2014-05-18 – 2014-05-21 (×7): 3 mL via INTRAVENOUS

## 2014-05-17 MED ORDER — ZOLPIDEM TARTRATE 5 MG PO TABS: 5.0000 mg | ORAL_TABLET | Freq: Every evening | ORAL | Status: DC | PRN

## 2014-05-17 MED ORDER — ALBUTEROL SULFATE (2.5 MG/3ML) 0.083% IN NEBU
2.5000 mg | INHALATION_SOLUTION | RESPIRATORY_TRACT | Status: DC | PRN
  Administered 2014-05-20: 2.5 mg via RESPIRATORY_TRACT
  Filled 2014-05-17: qty 3

## 2014-05-17 MED ORDER — ENOXAPARIN SODIUM 40 MG/0.4ML ~~LOC~~ SOLN
40.0000 mg | SUBCUTANEOUS | Status: DC
Start: 2014-05-18 — End: 2014-05-23
  Administered 2014-05-18 – 2014-05-23 (×6): 40 mg via SUBCUTANEOUS
  Filled 2014-05-17 (×7): qty 0.4

## 2014-05-17 NOTE — ED Notes (Signed)
Resident at bedside.  

## 2014-05-17 NOTE — H&P (Signed)
[facility]                                                                                    Patient Demographics  Stacy Novak, is a 73 y.o. female  CSN: [id number]  MRN: [id number]  DOB - 20-Mar-1941  Admit Date - 05/17/2014  Outpatient Primary MD for the patient is Rene Paci, MD   With History of -  Past Medical History  Diagnosis Date  . 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 COPD (chronic obstructive pulmonary disease)   . Bronchiectasis   . Hypercalcemia   . GERD (gastroesophageal reflux disease)   . Hypertension   . Asthma     hx of PNA  . Adult failure to thrive 08/06/2012    20 pound weight loss one year, anorexia, pernicious anemia   . 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 CXR 01/23/12- again shows essentially stable severe fibro--cystic scarring with bronchiectasis and air fluid levels, but no obvious progression. Desat to 86% with exertion walking room air- order portable O2   . GERD   . Pernicious anemia       Past Surgical History  Procedure Laterality Date  . Tonsillectomy    . Lung biopsy      in for   Chief Complaint  Patient presents with  . Shortness of Breath     HPI  Stacy Novak  is a 73 y.o. 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 It became severe enough for her not to be able to get up off the bed and walk one to 2 steps. Patient is being followed by Dr. Maple Hudson and Dr. Ninetta Lights [facility] hospital for her COPD and MAI respectively Reports  feeling of fever. No chest pains. Patient reports greenish productive cough no nausea or vomiting. Patient is on home oxygen 3 L per minute and it is not helping much .    Review of Systems    In addition to the HPI above, No  Fever-chills, No Headache, No changes with Vision or hearing, No problems swallowing food or Liquids, No Abdominal pain, No Nausea or Vommitting, Bowel movements are regular, No Blood in stool or Urine, No dysuria, No new skin rashes or bruises, No new joints pains-aches,  No new weakness, tingling, numbness in any extremity, No recent weight gain or loss, No polyuria, polydypsia or polyphagia, No significant Mental Stressors.  A full 10 point Review of Systems was done, except as stated above, all other Review of Systems were negative.   Social History History  Substance Use Topics  . Smoking status: Never Smoker   . Smokeless tobacco: Never Used  . Alcohol Use: No     Family History Family History  Problem Relation Age of Onset  . Hypertension    . Stroke Father   . Alzheimer's disease Mother   . Atrial fibrillation Mother   . Seizures Son  Prior to Admission medications   Medication Sig Start Date End Date Taking? Authorizing Provider  calcium carbonate (TUMS - DOSED IN MG ELEMENTAL CALCIUM) 500 MG chewable tablet Chew 1 tablet by mouth daily as needed for indigestion or heartburn.   Yes Historical Provider, MD  cyanocobalamin (,VITAMIN B-12,) 1000 MCG/ML injection Inject 1 mL (1,000 mcg total) into the muscle every 30 (thirty) days. 11/13/12  Yes Newt Lukes, MD  diltiazem (CARDIZEM CD) 120 MG 24 hr capsule Take 1 capsule (120 mg total) by mouth daily. 02/04/14  Yes Newt Lukes, MD  ipratropium (ATROVENT) 0.02 % nebulizer solution Take 2.5 mLs (0.5 mg total) by nebulization 3 (three) times daily. 01/15/14  Yes Marinda Elk, MD  levalbuterol Pauline Aus) 0.63 MG/3ML nebulizer solution Take 3 mLs (0.63 mg total) by nebulization every 3 (three) hours as needed for wheezing or shortness of breath. 01/15/14  Yes Marinda Elk, MD  Polyethyl Glycol-Propyl Glycol (SYSTANE) 0.4-0.3 % SOLN Place 1 drop into both eyes daily as needed (for dry eyes).    Yes  Historical Provider, MD  azithromycin (ZITHROMAX Z-PAK) 250 MG tablet 2 po on day 1 then 1 tab po on days 2-5 03/20/14   Ginnie Smart, MD    Allergies  Allergen Reactions  . Daliresp [Roflumilast] Other (See Comments)    Mental changes, feeling weird  . Doxycycline Other (See Comments)    Bad taste in mouth, "salty coating"  . Levaquin [Levofloxacin] Palpitations and Other (See Comments)    Joint pains/aches  . Other Other (See Comments)    DAIRY PRODUCTS-causes thick mucous secretions  . Sulfonamide Derivatives Nausea Only    Physical Exam  Vitals  Blood pressure 125/112, pulse 108, temperature 98.3 F (36.8 C), temperature source Oral, resp. rate 32, SpO2 88.00%.   1. General elderly white female sitting in bed using accessory muscles of breathing with lip pursing  2. Normal affect and insight, Not Suicidal or Homicidal, Awake Alert, Oriented X 3.  3. No F.N deficits grossly, ALL C.Nerves Intact,  4. Ears and Eyes appear Normal, Conjunctivae clear, PERRLA. Moist Oral Mucosa.  5. Supple Neck, No JVD, No cervical lymphadenopathy appriciated, No Carotid Bruits.  6. Symmetrical Chest wall movement, decreased air entry bilaterally.  7. RRR, tachycardic, No Gallops, Rubs or Murmurs, No Parasternal Heave.  8. Positive Bowel Sounds, Abdomen Soft, Non tender, No organomegaly appriciated,No rebound -guarding or rigidity.  9.  No Cyanosis, Normal Skin Turgor, No Skin Rash or Bruise.  10. Good muscle tone,  joints appear normal , no effusions, Normal ROM.  11. No Palpable Lymph Nodes in Neck or Axillae    Data Review  CBC  Recent Labs Lab 05/17/14 1936  WBC 7.5  HGB 13.4  HCT 42.5  PLT 239  MCV 88.4  MCH 27.9  MCHC 31.5  RDW 13.4  LYMPHSABS 1.3  MONOABS 0.6  EOSABS 0.1  BASOSABS 0.0   ------------------------------------------------------------------------------------------------------------------  Chemistries   Recent Labs Lab 05/17/14 1936  NA  138  K 4.4  CL 94*  CO2 36*  GLUCOSE 96  BUN 14  CREATININE 0.39*  CALCIUM 9.0   ------------------------------------------------------------------------------------------------------------------ CrCl is unknown because both a height and weight (above a minimum accepted value) are required for this calculation. ------------------------------------------------------------------------------------------------------------------ No results found for this basename: TSH, T4TOTAL, FREET3, T3FREE, THYROIDAB,  in the last 72 hours   Coagulation profile No results found for this basename: INR, PROTIME,  in the last 168 hours ------------------------------------------------------------------------------------------------------------------- No results found  for this basename: DDIMER,  in the last 72 hours -------------------------------------------------------------------------------------------------------------------  Cardiac Enzymes No results found for this basename: CK, CKMB, TROPONINI, MYOGLOBIN,  in the last 168 hours ------------------------------------------------------------------------------------------------------------------ No components found with this basename: POCBNP,    ---------------------------------------------------------------------------------------------------------------  Urinalysis    Component Value Date/Time   COLORURINE YELLOW 03/22/2012 0124   APPEARANCEUR CLEAR 03/22/2012 0124   LABSPEC 1.009 03/22/2012 0124   PHURINE 7.0 03/22/2012 0124   GLUCOSEU NEGATIVE 03/22/2012 0124   HGBUR NEGATIVE 03/22/2012 0124   BILIRUBINUR NEGATIVE 03/22/2012 0124   KETONESUR NEGATIVE 03/22/2012 0124   PROTEINUR NEGATIVE 03/22/2012 0124   UROBILINOGEN 0.2 03/22/2012 0124   NITRITE NEGATIVE 03/22/2012 0124   LEUKOCYTESUR TRACE* 03/22/2012 0124    ----------------------------------------------------------------------------------------------------------------   Imaging results:   Dg Chest 2  View  05/17/2014   CLINICAL DATA:  Shortness of breath.  EXAM: CHEST  2 VIEW  COMPARISON:  Jan 06, 2014.  FINDINGS: Cardiomediastinal silhouette appears normal. Stable nodular and reticular densities are noted diffusely throughout both lungs with associated diffuse cystic change. No pneumothorax or significant pleural effusion is noted. Probable bronchiectasis is also present as noted on prior exams. There is again noted a few air-fluid levels within the cystic foci which were present on prior exam.  IMPRESSION: Stable diffuse cystic change seen throughout both lungs with associated reticular nodular densities. There is no significant change compared to prior exam. These findings most likely represent sequela of previous severe or atypical infection.   Electronically Signed   By: Roque Lias M.D.   On: 05/17/2014 20:03    My personal review of EKG: Nonspecific ST changes with a right superior axis deviation and a rate of 109  Personally reviewed Old Chart from 12/2013  Assessment & Plan  1. 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    Solu-Medrol IV    Nebulizer treatment    Mucinex    Check sputum Gram stain and cultures    IV cefepime and Zithromax    Consult pulmonary in a.m. 2.HTN    Continue home medications 3. Vitamin B12 deficiency     Continue same treatment   DVT Prophylaxis Lovenox  AM Labs Ordered, also please review Full Orders  Family Communication: Admission, patients condition and plan of care including tests being ordered have been discussed with the patient and husband who indicate understanding and agree with the plan and Code Status.  Code Status full  Disposition Plan: Home  Time spent in minutes : 34 minutes  Condition GUARDED   @

## 2014-05-17 NOTE — ED Notes (Signed)
Pt c/o increased SOB with nausea; pt with hx of COPD; pt labored and distressed at present

## 2014-05-17 NOTE — ED Notes (Signed)
Patient transported to X-ray 

## 2014-05-17 NOTE — ED Notes (Signed)
Pt returned from X-ray.  

## 2014-05-17 NOTE — ED Notes (Signed)
Patient 02 drop to 84% on 3 liters, when she got up to use bedside toilet, Nurse Ashure was informed.

## 2014-05-17 NOTE — ED Notes (Signed)
Pt returned from xray

## 2014-05-17 NOTE — ED Provider Notes (Signed)
CSN: [id number]     Arrival date & time 05/17/14  1836 History   First MD Initiated Contact with Patient 05/17/14 1905     Chief Complaint  Patient presents with  . Shortness of Breath     (Consider location/radiation/quality/duration/timing/severity/associated sxs/prior Treatment) Patient is a 73 y.o. female presenting with shortness of breath.  Shortness of Breath Severity:  Severe Onset quality:  Gradual Duration:  1 day Timing:  Constant Progression:  Unchanged Chronicity:  Chronic Context: activity   Relieved by:  None tried Worsened by:  Activity Ineffective treatments:  None tried Associated symptoms: chest pain, cough, sputum production and wheezing   Associated symptoms: no abdominal pain, no fever, no headaches, no hemoptysis, no rash, no sore throat and no vomiting     Past Medical History  Diagnosis Date  . 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 COPD (chronic obstructive pulmonary disease)   . Bronchiectasis   . Hypercalcemia   . GERD (gastroesophageal reflux disease)   . Hypertension   . Asthma     hx of PNA  . Adult failure to thrive 08/06/2012    20 pound weight loss one year, anorexia, pernicious anemia   . 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 CXR 01/23/12- again shows essentially stable severe fibro--cystic scarring with bronchiectasis and air fluid levels, but no obvious progression. Desat to 86% with exertion walking room air- order portable O2   . GERD   . Pernicious anemia    Past Surgical History  Procedure Laterality Date  . Tonsillectomy    . Lung biopsy     Family History  Problem Relation Age of Onset  . Hypertension    . Stroke Father   . Alzheimer's disease Mother   . Atrial fibrillation Mother   . Seizures Son    History  Substance Use Topics  . Smoking status: Never Smoker   . Smokeless tobacco: Never Used  . Alcohol Use: No    OB History   Grav Para Term Preterm Abortions TAB SAB Ect Mult Living                 Review of Systems  Constitutional: Positive for fatigue. Negative for fever and chills.  HENT: Negative for congestion and sore throat.   Eyes: Negative for visual disturbance.  Respiratory: Positive for cough, sputum production, chest tightness, shortness of breath and wheezing. Negative for hemoptysis.   Cardiovascular: Positive for chest pain and leg swelling.  Gastrointestinal: Negative for nausea, vomiting, abdominal pain, diarrhea and constipation.  Genitourinary: Negative for dysuria, difficulty urinating and vaginal pain.  Musculoskeletal: Negative for arthralgias and myalgias.  Skin: Negative for rash.  Neurological: Negative for syncope and headaches.  Psychiatric/Behavioral: Negative for behavioral problems.  All other systems reviewed and are negative.     Allergies  Daliresp; Doxycycline; Levaquin; Other; and Sulfonamide derivatives  Home Medications   Prior to Admission medications   Medication Sig Start Date End Date Taking? Authorizing Provider  calcium carbonate (TUMS - DOSED IN MG ELEMENTAL CALCIUM) 500 MG chewable tablet Chew 1 tablet by mouth daily as needed for indigestion or heartburn.   Yes Historical Provider, MD  cyanocobalamin (,VITAMIN B-12,) 1000 MCG/ML injection Inject 1 mL (1,000 mcg total) into the muscle every 30 (thirty) days. 11/13/12  Yes Newt Lukes, MD  diltiazem (CARDIZEM CD) 120 MG 24 hr capsule Take 1 capsule (120 mg total) by mouth  daily. 02/04/14  Yes Newt Lukes, MD  ipratropium (ATROVENT) 0.02 % nebulizer solution Take 2.5 mLs (0.5 mg total) by nebulization 3 (three) times daily. 01/15/14  Yes Marinda Elk, MD  levalbuterol Pauline Aus) 0.63 MG/3ML nebulizer solution Take 3 mLs (0.63 mg total) by nebulization every 3 (three) hours as needed for wheezing or shortness of breath. 01/15/14  Yes Marinda Elk, MD  Polyethyl  Glycol-Propyl Glycol (SYSTANE) 0.4-0.3 % SOLN Place 1 drop into both eyes daily as needed (for dry eyes).    Yes Historical Provider, MD  azithromycin (ZITHROMAX Z-PAK) 250 MG tablet 2 po on day 1 then 1 tab po on days 2-5 03/20/14   Ginnie Smart, MD   BP 145/76  Pulse 105  Temp(Src) 98.2 F (36.8 C) (Oral)  Resp 25  Ht  (1.702 m)  Wt 116 lb 3.2 oz (52.708 kg)  BMI 18.20 kg/m2  SpO2 87% Physical Exam  Vitals reviewed. Constitutional: She is oriented to person, place, and time. She appears distressed.  HENT:  Head: Normocephalic and atraumatic.  Eyes: EOM are normal.  Neck: Normal range of motion.  Cardiovascular: Normal rate, regular rhythm and normal heart sounds.   No murmur heard. Pulmonary/Chest: She is in respiratory distress. She has decreased breath sounds. She has wheezes. She has rhonchi in the right upper field.  Abdominal: Soft. There is no tenderness.  Musculoskeletal: She exhibits no edema.  Neurological: She is alert and oriented to person, place, and time.  Skin: She is not diaphoretic.  Psychiatric: She has a normal mood and affect. Her behavior is normal.    ED Course  Procedures (including critical care time) Labs Review Labs Reviewed  BASIC METABOLIC PANEL - Abnormal; Notable for the following:    Chloride 94 (*)    CO2 36 (*)    Creatinine, Ser 0.39 (*)    All other components within normal limits  PRO B NATRIURETIC PEPTIDE - Abnormal; Notable for the following:    Pro B Natriuretic peptide (BNP) 832.1 (*)    All other components within normal limits  CULTURE, BLOOD (ROUTINE X 2)  CULTURE, BLOOD (ROUTINE X 2)  CULTURE, EXPECTORATED SPUTUM-ASSESSMENT  GRAM STAIN  CBC WITH DIFFERENTIAL  BASIC METABOLIC PANEL  LEGIONELLA ANTIGEN, URINE  STREP PNEUMONIAE URINARY ANTIGEN    Imaging Review Dg Chest 2 View  05/17/2014   CLINICAL DATA:  Shortness of breath.  EXAM: CHEST  2 VIEW  COMPARISON:  Jan 06, 2014.  FINDINGS: Cardiomediastinal  silhouette appears normal. Stable nodular and reticular densities are noted diffusely throughout both lungs with associated diffuse cystic change. No pneumothorax or significant pleural effusion is noted. Probable bronchiectasis is also present as noted on prior exams. There is again noted a few air-fluid levels within the cystic foci which were present on prior exam.  IMPRESSION: Stable diffuse cystic change seen throughout both lungs with associated reticular nodular densities. There is no significant change compared to prior exam. These findings most likely represent sequela of previous severe or atypical infection.   Electronically Signed   By: Roque Lias M.D.   On: 05/17/2014 20:03     EKG Interpretation   Date/Time:  Sunday May 17 2014 18:45:51 EDT Ventricular Rate:  109 PR Interval:  170 QRS Duration: 78 QT Interval:  312 QTC Calculation: 420 R Axis:   -158 Text Interpretation:  Sinus tachycardia Right superior axis deviation ST  \T\ T wave abnormality, consider inferior ischemia Abnormal ECG Sinus  tachycardia Artifact  ST-t wave abnormality Abnormal ekg Confirmed by  Gerhard Munch  MD [phone]) on 05/17/2014 6:48:49 PM      MDM   Final diagnoses:  Hypoxemia  Acute on chronic respiratory failure with hypoxia  Adult failure to thrive  B12 deficiency anemia  Essential hypertension  Pneumonia, organism unspecified    Pt has h/o COPD, MAI, CHF, and recurrent admission for respiratory distress.  Pt did not take her Atrovent earlier today during onset of symptoms b/c "I didn't feel like it". Pt given breathing treatment and no improvement of symptoms.  Pt is requiring increased O2 (4L [place] now, baseline of 2.5L).  Pt unable to ambulate d/t fatigue.  Pt's BNP 832.  Will admit to medicine for increased O2 requirement and CHF exacerbation.  Pt refused lasix until in her inpt room b/c she didn't want to have to walk to a bathroom. Pt offered bedpan and foley but  declined.    Beverely Risen, MD 05/18/14 [phone]

## 2014-05-18 ENCOUNTER — Encounter (HOSPITAL_COMMUNITY): Payer: Self-pay | Admitting: *Deleted

## 2014-05-18 DIAGNOSIS — J441 Chronic obstructive pulmonary disease with (acute) exacerbation: Secondary | ICD-10-CM

## 2014-05-18 DIAGNOSIS — K219 Gastro-esophageal reflux disease without esophagitis: Secondary | ICD-10-CM

## 2014-05-18 DIAGNOSIS — J479 Bronchiectasis, uncomplicated: Secondary | ICD-10-CM

## 2014-05-18 DIAGNOSIS — J471 Bronchiectasis with (acute) exacerbation: Secondary | ICD-10-CM

## 2014-05-18 LAB — HEMOGLOBIN A1C
Hgb A1c MFr Bld: 6.2 % — ABNORMAL HIGH (ref <5.7)
Mean Plasma Glucose: 131 mg/dL — ABNORMAL HIGH (ref <117)

## 2014-05-18 LAB — EXPECTORATED SPUTUM ASSESSMENT W REFEX TO RESP CULTURE

## 2014-05-18 LAB — BASIC METABOLIC PANEL
Anion gap: 11 (ref 5–15)
BUN: 16 mg/dL (ref 6–23)
CALCIUM: 8.8 mg/dL (ref 8.4–10.5)
CO2: 32 mEq/L (ref 19–32)
Chloride: 94 mEq/L — ABNORMAL LOW (ref 96–112)
Creatinine, Ser: 0.36 mg/dL — ABNORMAL LOW (ref 0.50–1.10)
GFR calc Af Amer: >90 mL/min (ref >90)
Glucose, Bld: 217 mg/dL — ABNORMAL HIGH (ref 70–99)
Potassium: 4.2 mEq/L (ref 3.7–5.3)
SODIUM: 137 meq/L (ref 137–147)

## 2014-05-18 LAB — EXPECTORATED SPUTUM ASSESSMENT W GRAM STAIN, RFLX TO RESP C

## 2014-05-18 LAB — LEGIONELLA ANTIGEN, URINE: Legionella Antigen, Urine: NEGATIVE

## 2014-05-18 LAB — GLUCOSE, CAPILLARY
GLUCOSE-CAPILLARY: 184 mg/dL — AB (ref 70–99)
GLUCOSE-CAPILLARY: 123 mg/dL — AB (ref 70–99)
Glucose-Capillary: 121 mg/dL — ABNORMAL HIGH (ref 70–99)

## 2014-05-18 LAB — STREP PNEUMONIAE URINARY ANTIGEN: STREP PNEUMO URINARY ANTIGEN: NEGATIVE

## 2014-05-18 MED ORDER — LEVALBUTEROL HCL 0.63 MG/3ML IN NEBU
0.6300 mg | INHALATION_SOLUTION | Freq: Four times a day (QID) | RESPIRATORY_TRACT | Status: DC
  Administered 2014-05-18 – 2014-05-19 (×5): 0.63 mg via RESPIRATORY_TRACT
  Filled 2014-05-18 (×9): qty 3

## 2014-05-18 MED ORDER — INSULIN ASPART 100 UNIT/ML ~~LOC~~ SOLN: 0.0000 [IU] | Freq: Every day | SUBCUTANEOUS | Status: DC

## 2014-05-18 MED ORDER — DEXTROSE 5 % IV SOLN
1.0000 g | Freq: Two times a day (BID) | INTRAVENOUS | Status: DC
  Administered 2014-05-18 – 2014-05-22 (×10): 1 g via INTRAVENOUS
  Filled 2014-05-18 (×11): qty 1

## 2014-05-18 MED ORDER — INSULIN ASPART 100 UNIT/ML ~~LOC~~ SOLN
0.0000 [IU] | Freq: Three times a day (TID) | SUBCUTANEOUS | Status: DC
  Administered 2014-05-18: 2 [IU] via SUBCUTANEOUS
  Administered 2014-05-19: 1 [IU] via SUBCUTANEOUS
  Administered 2014-05-19 (×2): 3 [IU] via SUBCUTANEOUS
  Administered 2014-05-20 – 2014-05-21 (×4): 1 [IU] via SUBCUTANEOUS
  Administered 2014-05-21: 3 [IU] via SUBCUTANEOUS
  Administered 2014-05-22 (×2): 1 [IU] via SUBCUTANEOUS

## 2014-05-18 MED ORDER — IPRATROPIUM BROMIDE 0.02 % IN SOLN
0.5000 mg | Freq: Four times a day (QID) | RESPIRATORY_TRACT | Status: DC
  Administered 2014-05-18 – 2014-05-19 (×5): 0.5 mg via RESPIRATORY_TRACT
  Filled 2014-05-18 (×4): qty 2.5

## 2014-05-18 NOTE — Progress Notes (Signed)
73yo female presents w/ acute exacerbation of end-stage lung dz w/ COPD and cystic bronchiectasis, to begin IV ABX.  Will start cefepime 1g IV Q12H for CrCl ~50 ml/min and monitor CBC and Cx.  Vernard Gambles, PharmD, BCPS 05/18/2014 12:03 AM

## 2014-05-18 NOTE — Progress Notes (Signed)
Patient ID: Stacy Novak  female  EAV:[id number]    DOB: 1940/09/14    DOA: 05/17/2014  PCP: Rene Paci, MD  Brief history of present illness  Patient is a 73 year old female with history of severe COPD, cystic bronchiectasis, chronic respiratory failure, pulmonary mycobacterial infection (treated) presented with increasing shortness of breath in the last few days, states not able to walk further than one or 2 steps due to dyspnea.   Assessment/Plan: Principal Problem:   Acute on chronic respiratory failure: Patient has a history of severe COPD with home O2 3 L, history of mycobacterial infection of lungs, chronic cystic bronchiectasis. Followed outpatient by Dr. Ninetta Lights and Dr. Maple Hudson - Continue Zithromax, cefepime, scheduled nebs, IV steroids, O2, Mucinex - Pulmonology consulted, d/w Dr Molli Knock, will obtain CT chest per rec's - Follow sputum culture and sensitivities, urine strep antigen negative  Active Problems:   Bronchiectasis with acute exacerbation - Management as #1    GERD - Continue PPI    HTN (hypertension) - Currently stable, continue Cardizem  History of B12 deficiency/anemia -On vitamin B12 injections monthly    DVT Prophylaxis:Lovenox  Code Status:Full CODE STATUS  Family Communication:  Disposition:  Consultants:  Pulmonology  Procedures:  None  Antibiotics:  IV cefepime  Zithromax  Subjective: Patient seen and examined, feels a slightly better this morning, eating breakfast, no fevers or chills or any chest pain.  Objective: Weight change:   Intake/Output Summary (Last 24 hours) at 05/18/14 0956 Last data filed at 05/18/14 0900  Gross per 24 hour  Intake    240 ml  Output    400 ml  Net   -160 ml   Blood pressure 127/62, pulse 92, temperature 97.6 F (36.4 C), temperature source Oral, resp. rate 22, height  (1.702 m), weight 52.708 kg (116 lb 3.2 oz), SpO2 90.00%.  Physical Exam: General: Alert and awake, oriented x3,  NAD, speaking in full sentences  CVS: S1-S2 clear, no murmur rubs or gallops Chest:Decreased air entry bilaterally  Abdomen: soft nontender, nondistended, normal bowel sounds  Extremities: no cyanosis, clubbing or edema noted bilaterally Neuro: Cranial nerves II-XII intact, no focal neurological deficits  Lab Results: Basic Metabolic Panel:  Recent Labs Lab 05/17/14 1936 05/18/14 0122  NA 138 137  K 4.4 4.2  CL 94* 94*  CO2 36* 32  GLUCOSE 96 217*  BUN 14 16  CREATININE 0.39* 0.36*  CALCIUM 9.0 8.8   Liver Function Tests: No results found for this basename: AST, ALT, ALKPHOS, BILITOT, PROT, ALBUMIN,  in the last 168 hours No results found for this basename: LIPASE, AMYLASE,  in the last 168 hours No results found for this basename: AMMONIA,  in the last 168 hours CBC:  Recent Labs Lab 05/17/14 1936  WBC 7.5  NEUTROABS 5.6  HGB 13.4  HCT 42.5  MCV 88.4  PLT 239   Cardiac Enzymes: No results found for this basename: CKTOTAL, CKMB, CKMBINDEX, TROPONINI,  in the last 168 hours BNP: No components found with this basename: POCBNP,  CBG: No results found for this basename: GLUCAP,  in the last 168 hours   Micro Results: Recent Results (from the past 240 hour(s))  CULTURE, EXPECTORATED SPUTUM-ASSESSMENT     Status: None   Collection Time    05/18/14  2:58 AM      Result Value Ref Range Status   Specimen Description SPUTUM   Final   Special Requests NONE   Final   Sputum evaluation  Final   Value: THIS SPECIMEN IS ACCEPTABLE. RESPIRATORY CULTURE REPORT TO FOLLOW.   Report Status 05/18/2014 FINAL   Final    Studies/Results: Dg Chest 2 View  05/17/2014   CLINICAL DATA:  Shortness of breath.  EXAM: CHEST  2 VIEW  COMPARISON:  Jan 06, 2014.  FINDINGS: Cardiomediastinal silhouette appears normal. Stable nodular and reticular densities are noted diffusely throughout both lungs with associated diffuse cystic change. No pneumothorax or significant pleural effusion is  noted. Probable bronchiectasis is also present as noted on prior exams. There is again noted a few air-fluid levels within the cystic foci which were present on prior exam.  IMPRESSION: Stable diffuse cystic change seen throughout both lungs with associated reticular nodular densities. There is no significant change compared to prior exam. These findings most likely represent sequela of previous severe or atypical infection.   Electronically Signed   By: Roque Lias M.D.   On: 05/17/2014 20:03    Medications: Scheduled Meds: . azithromycin  500 mg Oral Daily  . ceFEPime (MAXIPIME) IV  1 g Intravenous Q12H  . diltiazem  120 mg Oral Daily  . enoxaparin (LOVENOX) injection  40 mg Subcutaneous Q24H  . guaiFENesin  1,200 mg Oral BID  . ipratropium  0.5 mg Nebulization QID  . levalbuterol  0.63 mg Nebulization QID  . methylPREDNISolone (SOLU-MEDROL) injection  125 mg Intravenous Q8H  . sodium chloride  3 mL Intravenous Q12H      LOS: 1 day   RAI,RIPUDEEP M.D. [facility] 05/18/2014, 9:56 AM Pager: [phone]  If 7PM-7AM, please contact night-coverage [email or web address] Password [email or web address]

## 2014-05-18 NOTE — Progress Notes (Addendum)
Ct called to inform RN that pt began coughing and was unable to lay flat for chest CT, test was not completed.  MD made aware.   CT called back this afternoon to try to do it again, pt again refused to have test done.

## 2014-05-18 NOTE — Consult Note (Signed)
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 Has resultant severe obstructive lung disease. (FEV1 49% predicted) and mild restrictive disease. Never smoked.  Admitted on 9/27 w/ BTX flare. Treated w/ ABX, systemic steroids, and BDs. Feeling better. PCCM asked to see.   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 (FEV1 49% predicted) and mild restrictive disease. Never smoked. Has been off MAI rx for some time (had refused d/t GI issues, now to frail to tolerate). Admitted on 9/27 w/ productive cough w/ green sputum and progressive dyspnea to point she could not ambulate further than 2 steps. She was admitted for treatment of BTX exacerbation and treated in usual fashion including abx, systemic steroids, BDs and O2. PCCM asked to follow.  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  has past surgical history that includes Tonsillectomy and Lung biopsy. Prior to Admission medications   Medication Sig Start Date End Date Taking? Authorizing Provider  calcium carbonate (TUMS - DOSED IN MG ELEMENTAL CALCIUM) 500 MG chewable tablet Chew 1 tablet by mouth daily as needed for indigestion or heartburn.   Yes  Historical Provider, MD  cyanocobalamin (,VITAMIN B-12,) 1000 MCG/ML injection Inject 1 mL (1,000 mcg total) into the muscle every 30 (thirty) days. 11/13/12  Yes Newt Lukes, MD  diltiazem (CARDIZEM CD) 120 MG 24 hr capsule Take 1 capsule (120 mg total) by mouth daily. 02/04/14  Yes Newt Lukes, MD  ipratropium (ATROVENT) 0.02 % nebulizer solution Take 2.5 mLs (0.5 mg total) by nebulization 3 (three) times daily. 01/15/14  Yes Marinda Elk, MD  levalbuterol Pauline Aus) 0.63 MG/3ML nebulizer solution Take 3 mLs (0.63 mg total) by nebulization every 3 (three) hours as needed for wheezing or shortness of breath. 01/15/14  Yes Marinda Elk, MD  Polyethyl Glycol-Propyl Glycol (SYSTANE) 0.4-0.3 % SOLN Place 1 drop into both eyes daily as needed (for dry eyes).    Yes Historical Provider, MD  azithromycin (ZITHROMAX Z-PAK) 250 MG tablet 2 po on day 1 then 1 tab po on days 2-5 03/20/14   Ginnie Smart, MD   Allergies  Allergen Reactions  . Daliresp [Roflumilast] Other (See Comments)    Mental changes, feeling weird  . Doxycycline Other (See Comments)    Bad taste in mouth, "salty coating"  . Levaquin [Levofloxacin] Palpitations and Other (See Comments)    Joint pains/aches  . Other Other (See Comments)    DAIRY PRODUCTS-causes thick mucous secretions  . Sulfonamide Derivatives Nausea Only    FAMILY HISTORY:  family history includes Alzheimer's disease in her mother; Atrial fibrillation in her mother; Hypertension in an other family member; Seizures in her son; Stroke in her father. SOCIAL HISTORY:  reports that  she has never smoked. She has never used smokeless tobacco. She reports that she does not drink alcohol or use illicit drugs.  REVIEW OF SYSTEMS:   Constitutional: Negative for fever, chills, weight loss, malaise/fatigue and diaphoresis.  HENT: Negative for hearing loss, ear pain, nosebleeds, congestion, sore throat, neck pain, tinnitus and ear discharge.  Eyes:  Negative for blurred vision, double vision, photophobia, pain, discharge and redness.  Respiratory: cough +cough, thick tan, hemoptysis, sputum production, shortness of breath, wheezing and stridor.   Cardiovascular: Negative for chest pain, palpitations, orthopnea, claudication, leg swelling and PND.  Gastrointestinal: Negative for heartburn, nausea, vomiting, abdominal pain, diarrhea, constipation, blood in stool and melena.  Genitourinary: Negative for dysuria, urgency, frequency, hematuria and flank pain.  Musculoskeletal: Negative for myalgias, back pain, joint pain and falls.  Skin: Negative for itching and rash.  Neurological: Negative for dizziness, tingling, tremors, sensory change, speech change, focal weakness, seizures, loss of consciousness, weakness and headaches.  Endo/Heme/Allergies: Negative for environmental allergies and polydipsia. Does not bruise/bleed easily.  SUBJECTIVE:   VITAL SIGNS: Temp:  [97.6 F (36.4 C)-98.3 F (36.8 C)] 97.6 F (36.4 C) (09/28 0546) Pulse Rate:  [92-118] 92 (09/28 0546) Resp:  [17-32] 22 (09/28 0546) BP: (125-150)/(39-112) 127/62 mmHg (09/28 0546) SpO2:  [80 %-95 %] 93 % (09/28 1020) Weight:  [52.708 kg (116 lb 3.2 oz)] 52.708 kg (116 lb 3.2 oz) (09/27 2359)  PHYSICAL EXAMINATION: General:  Frail 73 year old female, no acute distress  Neuro:  [place], no JVD HEENT:  [place], no JVD  Cardiovascular:  rrr Lungs:  Posterior rales  Abdomen:  Soft, non-tender  Musculoskeletal:  Intact  Skin:  Intact   Recent Labs Lab 05/17/14 1936 05/18/14 0122  NA 138 137  K 4.4 4.2  CL 94* 94*  CO2 36* 32  BUN 14 16  CREATININE 0.39* 0.36*  GLUCOSE 96 217*    Recent Labs Lab 05/17/14 1936  HGB 13.4  HCT 42.5  WBC 7.5  PLT 239   Dg Chest 2 View  05/17/2014   CLINICAL DATA:  Shortness of breath.  EXAM: CHEST  2 VIEW  COMPARISON:  Jan 06, 2014.  FINDINGS: Cardiomediastinal silhouette appears normal. Stable nodular and reticular densities are noted  diffusely throughout both lungs with associated diffuse cystic change. No pneumothorax or significant pleural effusion is noted. Probable bronchiectasis is also present as noted on prior exams. There is again noted a few air-fluid levels within the cystic foci which were present on prior exam.  IMPRESSION: Stable diffuse cystic change seen throughout both lungs with associated reticular nodular densities. There is no significant change compared to prior exam. These findings most likely represent sequela of previous severe or atypical infection.   Electronically Signed   By: Roque Lias M.D.   On: 05/17/2014 20:03    ASSESSMENT / PLAN Chronic respiratory failure in setting of severe cystic BTX from [facility] w/ resultant severe obstructive disease and mod, now probably severe restrictive disease, complicated by failure to thrive. Now admitted w/ BTX flare.   Clinically improving. Little to add. Had long discussion about goals of care. She is a terrible candidate for life support. Have strongly urged her and her family to revisit this.   Plan Complete 7-10d abx Taper steroids over 2 weeks Wean FIO2 Cont BDs Could be discharged to home when medically ready F/u w/ [name] or Tammy Parrett in 2 weeks, call [phone] for appointment  F/U post CT in AM.  Alyson Reedy,  M.D. [facility] Pulmonary/Critical Care Medicine. Pager: [phone]. After hours pager: [phone].  05/18/2014, 11:46 AM

## 2014-05-18 NOTE — Evaluation (Signed)
Physical Therapy Evaluation Patient Details Name: Stacy Novak MRN: [id number] DOB: 1940/10/09 Today's Date: 05/18/2014   History of Present Illness  Stacy Novak  is a 73 y.o. female adm 05/17/14 with incr SOB, difficulty transferring to w/c;  PMHx: severe COPD, cystic bronchiectasis,  MAC,  FTT  Clinical Impression  Pt will benefit from PT in acute setting to address deficits below; Pt is quite deconditioned and desats to 86-84% with activity while on 3L O2, she recovers to 90% in ~ 2 minutes; Pt reports her sats run in the low 90s at home; Pt may benefit from HHPT to allow pt to achieve  greater I with basic mobility and decr burden of care; Pt states she and her husband have been living in a hotel because it makes mobility while in the w/c easier;  Pt unwilling to sit up in recliner at  Time of this eval, we have discussed OOB to recliner as a goal for today with her student nurse present adn very willing to assist with this later today.    Follow Up Recommendations Home health PT    Equipment Recommendations   (?may benefit from rollator with seat?)    Recommendations for Other Services       Precautions / Restrictions Precautions Precaution Comments: monitor sats Restrictions Weight Bearing Restrictions: No      Mobility  Bed Mobility Overal bed mobility: Needs Assistance Bed Mobility: Supine to Sit;Sit to Supine     Supine to sit: Modified independent (Device/Increase time) Sit to supine: Modified independent (Device/Increase time)   General bed mobility comments: incr time and desats with bed mobility, incr DOE  Transfers Overall transfer level: Needs assistance Equipment used: None Transfers: Sit to/from [employer] Sit to Stand: Min guard Stand pivot transfers: Min guard;Min assist       General transfer comment: partial stand, trunk remains flexed for pivot transfer to [facility], able to stand with improved trunk extension for pivot back to bed; pt  requires min to min/guard for balance with initial balance and with slight LOB to Left when attempting to pull up pants; pt requires cues for technique and safety,  initally requesting  BSC be directly in front of her, pt agreeable to placing it adjacent to bed  in light  of energy conservation  Ambulation/Gait                Stairs            Wheelchair Mobility    Modified Rankin (Stroke Patients Only)       Balance Overall balance assessment: Needs assistance Sitting-balance support: Feet supported;Single extremity supported Sitting balance-Leahy Scale: Fair Sitting balance - Comments: pt lost balance while attempting to pull up pants     Standing balance-Leahy Scale: Fair                               Pertinent Vitals/Pain Pain Assessment: No/denies pain    Home Living Family/patient expects to be discharged to:: Private residence Living Arrangements: Spouse/significant other   Type of Home: House Home Access: Stairs to enter Entrance Stairs-Rails: Right Entrance Stairs-Number of Steps: 2 Home Layout: Two level;Able to live on main level with bedroom/bathroom Home Equipment: Other (comment) Additional Comments: on Home O2; pt reports they have been staying in a hotel b/c it is easier to get around in the wheelchair    Prior Function Level of Independence: Needs assistance  Gait / Transfers Assistance Needed: Transfers only - states she is able to take a couple steps to get to w/c or recliner. Hasn't walked any distance since Dec 2014     Comments: pt reports she still drives and this is the only thing she has left     Hand Dominance   Dominant Hand: Right    Extremity/Trunk Assessment   Upper Extremity Assessment: Generalized weakness           Lower Extremity Assessment: Generalized weakness         Communication   Communication: No difficulties  Cognition Arousal/Alertness: Awake/alert Behavior During Therapy: WFL for  tasks assessed/performed Overall Cognitive Status: Within Functional Limits for tasks assessed                      General Comments General comments (skin integrity, edema, etc.): pt attempting to get to 3in1 with RT on PTs arrival;     Exercises        Assessment/Plan    PT Assessment Patient needs continued PT services  PT Diagnosis Difficulty walking;Generalized weakness   PT Problem List Decreased strength;Decreased activity tolerance;Decreased balance;Decreased mobility  PT Treatment Interventions DME instruction;Gait training;Functional mobility training;Therapeutic activities;Therapeutic exercise;Patient/family education;Balance training   PT Goals (Current goals can be found in the Care Plan section) Acute Rehab PT Goals Patient Stated Goal: wants to be more I--able to walk a few steps, transfer more I'ly PT Goal Formulation: With patient Time For Goal Achievement: 05/25/14 Potential to Achieve Goals: Good    Frequency Min 2X/week   Barriers to discharge        Co-evaluation               End of Session Equipment Utilized During Treatment: Gait belt Activity Tolerance: Patient limited by fatigue Patient left: in bed;with call bell/phone within reach;with nursing/sitter in room Nurse Communication: Mobility status         Time: 0454-0981 PT Time Calculation (min): 27 min   Charges:   PT Evaluation $Initial PT Evaluation Tier I: 1 Procedure PT Treatments $Therapeutic Activity: 8-22 mins   PT G Codes:          Stacy Novak 05-27-2014, 9:42 AM

## 2014-05-19 DIAGNOSIS — A310 Pulmonary mycobacterial infection: Secondary | ICD-10-CM

## 2014-05-19 LAB — GLUCOSE, CAPILLARY
GLUCOSE-CAPILLARY: 209 mg/dL — AB (ref 70–99)
Glucose-Capillary: 133 mg/dL — ABNORMAL HIGH (ref 70–99)
Glucose-Capillary: 238 mg/dL — ABNORMAL HIGH (ref 70–99)
Glucose-Capillary: 135 mg/dL — ABNORMAL HIGH (ref 70–99)

## 2014-05-19 MED ORDER — FUROSEMIDE 10 MG/ML IJ SOLN
40.0000 mg | Freq: Once | INTRAMUSCULAR | Status: AC
  Administered 2014-05-19: 40 mg via INTRAVENOUS
  Filled 2014-05-19: qty 4

## 2014-05-19 MED ORDER — BOOST / RESOURCE BREEZE PO LIQD
1.0000 | ORAL | Status: DC
  Administered 2014-05-19 – 2014-05-21 (×2): 1 via ORAL

## 2014-05-19 MED ORDER — LEVALBUTEROL HCL 0.63 MG/3ML IN NEBU
0.6300 mg | INHALATION_SOLUTION | Freq: Three times a day (TID) | RESPIRATORY_TRACT | Status: DC
  Administered 2014-05-19 – 2014-05-23 (×10): 0.63 mg via RESPIRATORY_TRACT
  Filled 2014-05-19 (×17): qty 3

## 2014-05-19 MED ORDER — METHYLPREDNISOLONE SODIUM SUCC 125 MG IJ SOLR
60.0000 mg | Freq: Three times a day (TID) | INTRAMUSCULAR | Status: DC
  Administered 2014-05-19 – 2014-05-22 (×9): 60 mg via INTRAVENOUS
  Filled 2014-05-19 (×4): qty 0.96
  Filled 2014-05-19: qty 2
  Filled 2014-05-19 (×7): qty 0.96
  Filled 2014-05-19: qty 2
  Filled 2014-05-19: qty 0.96

## 2014-05-19 MED ORDER — TIOTROPIUM BROMIDE MONOHYDRATE 18 MCG IN CAPS
18.0000 ug | ORAL_CAPSULE | Freq: Every day | RESPIRATORY_TRACT | Status: DC
  Administered 2014-05-19 – 2014-05-23 (×5): 18 ug via RESPIRATORY_TRACT
  Filled 2014-05-19 (×2): qty 5

## 2014-05-19 MED ORDER — IPRATROPIUM BROMIDE 0.02 % IN SOLN
0.5000 mg | Freq: Three times a day (TID) | RESPIRATORY_TRACT | Status: DC
  Filled 2014-05-19: qty 2.5

## 2014-05-19 NOTE — Clinical Documentation Improvement (Signed)
Possible Clinical Conditions?  Acute Systolic Congestive Heart Failure Acute Diastolic Congestive Heart Failure Acute Systolic & Diastolic Congestive Heart Failure Acute on Chronic Systolic Congestive Heart Failure Acute on Chronic Diastolic Congestive Heart Failure Acute on Chronic Systolic & Diastolic Congestive Heart Failure Other Condition Cannot Clinically Determine    Risk Factors: CHF exacerbation per 9/28 progress notes.  Diagnostics: 9/27: proBNP: 832.1.   Thank You, Marciano SequinWanda Mathews-Bethea,RN,BSN, Clinical Documentation Specialist:  [phone]  [facility]- Health Information Management

## 2014-05-19 NOTE — Progress Notes (Signed)
INITIAL NUTRITION ASSESSMENT  DOCUMENTATION CODES Per approved criteria  -Severe  malnutrition in the context of social or environmental circumstances   Pt meets criteria for severe MALNUTRITION in the context of social and environmental circumstances as evidenced by severe fat and muscle depletion.  INTERVENTION: -Resource Breeze po daily, each supplement provides 250 kcal and 9 grams of protein -Educated on basic principles of carb modified diet and high protein diet  NUTRITION DIAGNOSIS: Inadequate oral intake related to decreased appetite as evidenced by severe fat and muscle depletion.   Goal: Pt will meet >90% of estimated nutritional needs  Monitor:  PO/supplement intake, labs weight changes, I/O's  Reason for Assessment: Consult for poor po intake  73 y.o. female  Admitting Dx: Acute on chronic respiratory failure  Patient is a 73 year old female with history of severe COPD, cystic bronchiectasis, chronic respiratory failure, pulmonary mycobacterial infection (treated) presented with increasing shortness of breath in the last few days, states not able to walk further than one or 2 steps due to dyspnea.   ASSESSMENT: Pt confirms hx of poor appetite and weight loss. She reveals that her appetite has improved since hospitalization, however, was poor up until last week. Documented meal intake PO: 50-75%. She reveals that UBW is about 115-120# and weight loss occurred approximately 3 years ago, when her UBW was 140#. Weight has since stabilized. Wt hx reveals no significant changes in weight over the past year.  Pt also discloses that she has had a "complicated" social situation" over the past few months. She reports that her home is not suitable for wheelchair access, so [place] have been [place] [facility] [place]. Diet recall reveals: Breakfast: omelet without cheese or french toast sticks; Lunch: rotisserie chicken sandwich OR asparagus and baked potato; Dinner:  meat, starch, vegetable; HS snack: pound cake. Pt reports she [facility] with friends for socialization. Pt husband reports pt has been complaining of early satiety PTA.  Pt reports she likes [employer] Breeze drink and have been able to obtain them through her pharmacy. However, she is very concerned about her elevated blood sugar due to steroids. We discussed basic principles of carb modified diet and carbohydrate containing foods. Encouraged high fiber foods and appropriate portion sizes to assist with dietary management of blood sugar control. Also discussed ways pt could increase protein in her diet to help preserve muscle mass.  Creat: 0.36. Glucose: 217. CBG: 123-209. Hgb A1c elevated likely due to steroid use.   Nutrition Focused Physical Exam:  Subcutaneous Fat:  Orbital Region: severe depletion Upper Arm Region: severe depletion Thoracic and Lumbar Region: mild depletion  Muscle:  Temple Region: severe depletion Clavicle Bone Region: severe depletion Clavicle and Acromion Bone Region: severe depletion Scapular Bone Region: severe depletion Dorsal Hand: moderate depletion Patellar Region: moderate depletion Anterior Thigh Region: moderate depletion Posterior Calf Region: moderate depletion  Edema: none present  Height: Ht Readings from Last 1 Encounters:  05/17/14 5\' 7"  (1.702 m)    Weight: Wt Readings from Last 1 Encounters:  05/17/14 116 lb 3.2 oz (52.708 kg)    Ideal Body Weight: 135#  % Ideal Body Weight: 86%  Wt Readings from Last 10 Encounters:  05/17/14 116 lb 3.2 oz (52.708 kg)  05/04/14 118 lb 8 oz (53.751 kg)  03/20/14 112 lb (50.803 kg)  03/16/14 119 lb (53.978 kg)  02/04/14 116 lb 12.8 oz (52.98 kg)  01/21/14 115 lb 12.8 oz (52.527 kg)  01/15/14 111  lb 14.4 oz (50.758 kg)  01/02/14 116 lb 9.6 oz (52.889 kg)  10/17/13 119 lb 6.4 oz (54.159 kg)  08/06/13 122 lb 12.8 oz (55.702 kg)    Usual Body Weight: 115#  % Usual  Body Weight: 97%  BMI:  Body mass index is 18.2 kg/(m^2). Underweight  Estimated Nutritional Needs: Kcal: 1600-1800 Protein: 63-73 grams  Fluid: 1.6-1.8 L  Skin: WDL  Diet Order: Carb Control  EDUCATION NEEDS: -Education needs addressed   Intake/Output Summary (Last 24 hours) at 05/19/14 1216 Last data filed at 05/19/14 1130  Gross per 24 hour  Intake    340 ml  Output   1750 ml  Net  -1410 ml    Last BM: 05/19/14  Labs:   Recent Labs Lab 05/17/14 1936 05/18/14 0122  NA 138 137  K 4.4 4.2  CL 94* 94*  CO2 36* 32  BUN 14 16  CREATININE 0.39* 0.36*  CALCIUM 9.0 8.8  GLUCOSE 96 217*    CBG (last 3)   Recent Labs  05/18/14 2143 05/19/14 0742 05/19/14 1154  GLUCAP 123* 133* 209*   Lab Results  Component Value Date   HGBA1C 6.2* 05/18/2014   Scheduled Meds: . azithromycin  500 mg Oral Daily  . ceFEPime (MAXIPIME) IV  1 g Intravenous Q12H  . diltiazem  120 mg Oral Daily  . enoxaparin (LOVENOX) injection  40 mg Subcutaneous Q24H  . guaiFENesin  1,200 mg Oral BID  . insulin aspart  0-5 Units Subcutaneous QHS  . insulin aspart  0-9 Units Subcutaneous TID WC  . levalbuterol  0.63 mg Nebulization TID  . methylPREDNISolone (SOLU-MEDROL) injection  60 mg Intravenous Q8H  . sodium chloride  3 mL Intravenous Q12H  . tiotropium  18 mcg Inhalation Daily    Continuous Infusions:   Past Medical History  Diagnosis Date  . 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 COPD (chronic obstructive pulmonary disease)   . Bronchiectasis   . Hypercalcemia   . GERD (gastroesophageal reflux disease)   . Hypertension   . Asthma     hx of PNA  . Adult failure to thrive 08/06/2012    20 pound weight loss one year, anorexia, pernicious anemia   . 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 CXR 01/23/12- again shows essentially stable severe fibro--cystic scarring with bronchiectasis  and air fluid levels, but no obvious progression. Desat to 86% with exertion walking room air- order portable O2   . GERD   . Pernicious anemia     Past Surgical History  Procedure Laterality Date  . Tonsillectomy    . Lung biopsy      Mykaela Arena A. Mayford Knife, RD, LDN Pager: [phone] After hours Pager: [phone]

## 2014-05-19 NOTE — Progress Notes (Addendum)
Patient ID: Randalyn RheaJean B Densmore  female  ZOX:[id number]    DOB: 09/17/1940    DOA: 05/17/2014  PCP: Rene PaciValerie Leschber, MD  Brief history of present illness  Patient is a 73 year old female with history of severe COPD, cystic bronchiectasis, chronic respiratory failure, pulmonary mycobacterial infection (treated) presented with increasing shortness of breath in the last few days, states not able to walk further than one or 2 steps due to dyspnea.   Assessment/Plan: Principal Problem:   Acute on chronic respiratory failure: Patient has a history of severe COPD with home O2 3 L, history of mycobacterial infection of lungs, chronic cystic bronchiectasis. Followed outpatient by Dr. Ninetta LightsHatcher and Dr. Maple HudsonYoung - Continue Zithromax, cefepime, scheduled nebs, taper IV steroids, O2, Mucinex - Pulmonology consulted, patient was seen by Dr. Molli KnockYacoub, recommended CT scan of the chest. Patient was unable to tolerate CT scan and unable to lie flat. - Follow sputum culture and sensitivities, urine strep antigen negative - BNP elevated at 832, will try one dose of Lasix (2-D echo in 2013 head showed EF of 55%)  - DC atrovent, patient reports better complinace with spiriva, cont albuterol nebs  Active Problems:   Bronchiectasis with acute exacerbation - Management as #1    GERD - Continue PPI    HTN (hypertension) - Currently stable, continue Cardizem  History of B12 deficiency/anemia -On vitamin B12 injections monthly   FAILURE TO THRIVE - PT eval, nutrition consult   DVT Prophylaxis:Lovenox  Code Status: Full CODE STATUS  Family Communication:  Disposition:  Consultants:  Pulmonology  Procedures:  None  Antibiotics:  IV cefepime  Zithromax  Subjective: Patient seen and examined, feels more rested today, patient reports IV antibiotics are helping with her overall status, no fevers or chills overnight.   Objective: Weight change:   Intake/Output Summary (Last 24 hours) at 05/19/14  0944 Last data filed at 05/19/14 0508  Gross per 24 hour  Intake    100 ml  Output   1550 ml  Net  -1450 ml   Blood pressure 124/61, pulse 78, temperature 97.5 F (36.4 C), temperature source Oral, resp. rate 19, height 5\' 7"  (1.702 m), weight 52.708 kg (116 lb 3.2 oz), SpO2 93.00%.  Physical Exam: General: Alert and awake, oriented x3, NAD CVS: S1-S2 clear, no murmur rubs or gallops Chest:Decreased air entry bilaterally  Abdomen: soft nontender, nondistended, normal bowel sounds  Extremities: no cyanosis, clubbing or edema noted bilaterally   Lab Results: Basic Metabolic Panel:  Recent Labs Lab 05/17/14 1936 05/18/14 0122  NA 138 137  K 4.4 4.2  CL 94* 94*  CO2 36* 32  GLUCOSE 96 217*  BUN 14 16  CREATININE 0.39* 0.36*  CALCIUM 9.0 8.8   Liver Function Tests: No results found for this basename: AST, ALT, ALKPHOS, BILITOT, PROT, ALBUMIN,  in the last 168 hours No results found for this basename: LIPASE, AMYLASE,  in the last 168 hours No results found for this basename: AMMONIA,  in the last 168 hours CBC:  Recent Labs Lab 05/17/14 1936  WBC 7.5  NEUTROABS 5.6  HGB 13.4  HCT 42.5  MCV 88.4  PLT 239   Cardiac Enzymes: No results found for this basename: CKTOTAL, CKMB, CKMBINDEX, TROPONINI,  in the last 168 hours BNP: No components found with this basename: POCBNP,  CBG:  Recent Labs Lab 05/18/14 1241 05/18/14 1736 05/18/14 2143 05/19/14 0742  GLUCAP 121* 184* 123* 133*     Micro Results: Recent Results (from the past  240 hour(s))  CULTURE, EXPECTORATED SPUTUM-ASSESSMENT     Status: None   Collection Time    05/18/14  2:58 AM      Result Value Ref Range Status   Specimen Description SPUTUM   Final   Special Requests NONE   Final   Sputum evaluation     Final   Value: THIS SPECIMEN IS ACCEPTABLE. RESPIRATORY CULTURE REPORT TO FOLLOW.   Report Status 05/18/2014 FINAL   Final  CULTURE, RESPIRATORY (NON-EXPECTORATED)     Status: None    Collection Time    05/18/14  2:58 AM      Result Value Ref Range Status   Specimen Description SPUTUM   Final   Special Requests ADDED [id number]   Final   Gram Stain     Final   Value: ABUNDANT WBC PRESENT,BOTH PMN AND MONONUCLEAR     RARE SQUAMOUS EPITHELIAL CELLS PRESENT     RARE GRAM POSITIVE COCCI     IN PAIRS RARE GRAM POSITIVE RODS     Performed at [facility]   Culture     Final   Value: NORMAL OROPHARYNGEAL FLORA     Performed at [facility]   Report Status PENDING   Incomplete    Studies/Results: Dg Chest 2 View  05/17/2014   CLINICAL DATA:  Shortness of breath.  EXAM: CHEST  2 VIEW  COMPARISON:  Jan 06, 2014.  FINDINGS: Cardiomediastinal silhouette appears normal. Stable nodular and reticular densities are noted diffusely throughout both lungs with associated diffuse cystic change. No pneumothorax or significant pleural effusion is noted. Probable bronchiectasis is also present as noted on prior exams. There is again noted a few air-fluid levels within the cystic foci which were present on prior exam.  IMPRESSION: Stable diffuse cystic change seen throughout both lungs with associated reticular nodular densities. There is no significant change compared to prior exam. These findings most likely represent sequela of previous severe or atypical infection.   Electronically Signed   By: Roque Lias M.D.   On: 05/17/2014 20:03    Medications: Scheduled Meds: . azithromycin  500 mg Oral Daily  . ceFEPime (MAXIPIME) IV  1 g Intravenous Q12H  . diltiazem  120 mg Oral Daily  . enoxaparin (LOVENOX) injection  40 mg Subcutaneous Q24H  . guaiFENesin  1,200 mg Oral BID  . insulin aspart  0-5 Units Subcutaneous QHS  . insulin aspart  0-9 Units Subcutaneous TID WC  . ipratropium  0.5 mg Nebulization TID  . levalbuterol  0.63 mg Nebulization TID  . methylPREDNISolone (SOLU-MEDROL) injection  125 mg Intravenous Q8H  . sodium chloride  3 mL Intravenous Q12H      LOS: 2 days    Jeniah Kishi M.D. [facility] 05/19/2014, 9:44 AM Pager: [phone]  If 7PM-7AM, please contact night-coverage [email or web address] Password [email or web address]

## 2014-05-19 NOTE — Progress Notes (Addendum)
   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 Has resultant severe obstructive lung disease. (FEV1 49% predicted) and mild restrictive disease. Never smoked.  Admitted on 9/27 w/ BTX flare. Treated w/ ABX, systemic steroids, and BDs. Feeling better. PCCM asked to see.   SIGNIFICANT EVENTS  None  STUDIES:  Noted  SUBJECTIVE:   VITAL SIGNS: Temp:  [97.5 F (36.4 C)-98.1 F (36.7 C)] 97.5 F (36.4 C) (09/29 0507) Pulse Rate:  [78-93] 78 (09/29 0507) Resp:  [18-20] 19 (09/29 0507) BP: (121-132)/(55-71) 124/61 mmHg (09/29 0507) SpO2:  [90 %-96 %] 93 % (09/29 0507)  PHYSICAL EXAMINATION: General:  Frail 73 year old female, no acute distress  Neuro:  [place], no JVD HEENT:  , no JVD  Cardiovascular:  rrr Lungs:  Posterior rales  Abdomen:  Soft, non-tender  Musculoskeletal:  Intact  Skin:  Intact   Recent Labs Lab 05/17/14 1936 05/18/14 0122  NA 138 137  K 4.4 4.2  CL 94* 94*  CO2 36* 32  BUN 14 16  CREATININE 0.39* 0.36*  GLUCOSE 96 217*   Recent Labs Lab 05/17/14 1936  HGB 13.4  HCT 42.5  WBC 7.5  PLT 239   Dg Chest 2 View  05/17/2014   CLINICAL DATA:  Shortness of breath.  EXAM: CHEST  2 VIEW  COMPARISON:  Jan 06, 2014.  FINDINGS: Cardiomediastinal silhouette appears normal. Stable nodular and reticular densities are noted diffusely throughout both lungs with associated diffuse cystic change. No pneumothorax or significant pleural effusion is noted. Probable bronchiectasis is also present as noted on prior exams. There is again noted a few air-fluid levels within the cystic foci which were present on prior exam.  IMPRESSION: Stable diffuse cystic  change seen throughout both lungs with associated reticular nodular densities. There is no significant change compared to prior exam. These findings most likely represent sequela of previous severe or atypical infection.   Electronically Signed   By: Roque LiasJames  Green M.D.   On: 05/17/2014 20:03   ASSESSMENT / PLAN [facility] respiratory [facility] setting [facility]/ resultant severe obstructive disease and mod, now probably severe restrictive disease, complicated by failure to thrive. Now admitted w/ BTX flare.   Clinically improving. Little to add. Had long discussion about goals of care. She is a terrible candidate for life support. Have strongly urged her and her family to revisit this.   Plan Complete 7-10d abx Taper steroids over 2 weeks Wean FIO2 Cont BDs Could be discharged to home when medically ready F/u w/ Dr Maple HudsonYoung or Tammy Parrett in 2 weeks, call [phone] for appointment  F/U post CT in AM.  PCCM will sign off, please call back if needed.  Alyson ReedyWesam G. Tashawn Laswell, M.D. [facility]. Pager: [phone]. After hours pager: [phone].  05/19/2014, 12:59 PM

## 2014-05-19 NOTE — ED Provider Notes (Signed)
This patient was seen in conjunction with the resident physician.  The documentation accurately reflects the patient's ED evaluation (with the following clarifications).  On my exam, patient was dyspneic. Patient has multiple medical issues, including CHF, COPD, and MAI, and this presentation seems multifactorial, with evidence via elevated BNP, abnormal chest x-ray, increased oxygen requirement.   EKG had sinus tachycardia, rate 109, T-wave abnormalities, abnormal  Patient required admission for further evaluation, management.  Gerhard Munchobert Calista Crain, MD 05/19/14 [phone]

## 2014-05-20 DIAGNOSIS — E43 Unspecified severe protein-calorie malnutrition: Secondary | ICD-10-CM | POA: Insufficient documentation

## 2014-05-20 LAB — BASIC METABOLIC PANEL
Anion gap: 5 (ref 5–15)
BUN: 26 mg/dL — AB (ref 6–23)
CALCIUM: 8.5 mg/dL (ref 8.4–10.5)
CO2: 35 mEq/L — ABNORMAL HIGH (ref 19–32)
Chloride: 99 mEq/L (ref 96–112)
Creatinine, Ser: 0.36 mg/dL — ABNORMAL LOW (ref 0.50–1.10)
GLUCOSE: 141 mg/dL — AB (ref 70–99)
Potassium: 4.4 mEq/L (ref 3.7–5.3)
Sodium: 139 mEq/L (ref 137–147)

## 2014-05-20 LAB — CBC
HCT: 39.4 % (ref 36.0–46.0)
Hemoglobin: 12 g/dL (ref 12.0–15.0)
MCH: 27 pg (ref 26.0–34.0)
MCHC: 30.5 g/dL (ref 30.0–36.0)
MCV: 88.5 fL (ref 78.0–100.0)
PLATELETS: 255 10*3/uL (ref 150–400)
RBC: 4.45 MIL/uL (ref 3.87–5.11)
RDW: 13.6 % (ref 11.5–15.5)
WBC: 9.8 10*3/uL (ref 4.0–10.5)

## 2014-05-20 LAB — CULTURE, RESPIRATORY: Culture: NORMAL

## 2014-05-20 LAB — CULTURE, RESPIRATORY W GRAM STAIN

## 2014-05-20 LAB — GLUCOSE, CAPILLARY
GLUCOSE-CAPILLARY: 125 mg/dL — AB (ref 70–99)
Glucose-Capillary: 123 mg/dL — ABNORMAL HIGH (ref 70–99)
Glucose-Capillary: 147 mg/dL — ABNORMAL HIGH (ref 70–99)

## 2014-05-20 NOTE — Progress Notes (Signed)
[facility] PROGRESS NOTE  Stacy Novak [id number] DOB: 09/07/1940 DOA: 05/17/2014 PCP: Rene PaciValerie Leschber, MD  Assessment/Plan: 73y/o female with PMH of End Stage Lung Disease, COPD, Pulmonary fibrosis, Cystic Bronchiectasis, Cchronic respiratory failure, Pulmonary mycobacterial infection (treated) presented with increasing shortness of breath in the last few days, states not able to walk further than one or 2 steps due to dyspnea.   1. Acute on chronic respiratory failure due to advanced lung disease;  -Patient has a history of severe COPD with home O2 3 L, history of mycobacterial infection of lungs, chronic cystic bronchiectasis. Followed outpatient by Dr. Ninetta LightsHatcher and Dr. Maple HudsonYoung  -Continue Zithromax, cefepime, scheduled nebs, taper IV steroids, O2, Mucinex  -Pulmonology consulted, patient was seen by Dr. Molli KnockYacoub, recommended CT scan of the chest. Patient was unable to tolerate CT scan and unable to lie flat.  -Blood cultures: NGTD;  urine strep antigen negative   -BNP elevated at 832,(2-D echo in 2013 head showed EF of 55%) no clinical s/s of acute CHF -check echo';   2. Bronchiectasis with acute exacerbation  Management as #1  3. GERD  Continue PPI  4. HTN (hypertension)  Currently stable, continue Cardizem  5. History of B12 deficiency/anemia On vitamin B12 injections monthly  6. FAILURE TO THRIVE  PT eval, nutrition consult   Code Status: full Family Communication: d/w patient (indicate person spoken with, relationship, and if by phone, the number) Disposition Plan: home 24-48 hrs    Consultants:  Pulmonology   Procedures:  none  Antibiotics:  azithro 9/28<<  Cefepime 9/28<<<   (indicate start date, and stop date if known)  HPI/Subjective: alert  Objective: Filed Vitals:   05/19/14 2204  BP: 133/56  Pulse: 82  Temp:   Resp: 18    Intake/Output Summary (Last 24 hours) at 05/20/14 0926 Last data filed at 05/20/14 0500  Gross per 24 hour  Intake    1120 ml  Output   1050 ml  Net     70 ml   Filed Weights   05/17/14 2359  Weight: 52.708 kg (116 lb 3.2 oz)    Exam:   General:  alert  Cardiovascular: s1,s2 rrr  Respiratory: BL fine crackles   Abdomen: soft, nt,nd   Musculoskeletal: no LE edeam   Data Reviewed: Basic Metabolic Panel:  Recent Labs Lab 05/17/14 1936 05/18/14 0122 05/20/14 0459  NA 138 137 139  K 4.4 4.2 4.4  CL 94* 94* 99  CO2 36* 32 35*  GLUCOSE 96 217* 141*  BUN 14 16 26*  CREATININE 0.39* 0.36* 0.36*  CALCIUM 9.0 8.8 8.5   Liver Function Tests: No results found for this basename: AST, ALT, ALKPHOS, BILITOT, PROT, ALBUMIN,  in the last 168 hours No results found for this basename: LIPASE, AMYLASE,  in the last 168 hours No results found for this basename: AMMONIA,  in the last 168 hours CBC:  Recent Labs Lab 05/17/14 1936 05/20/14 0459  WBC 7.5 9.8  NEUTROABS 5.6  --   HGB 13.4 12.0  HCT 42.5 39.4  MCV 88.4 88.5  PLT 239 255   Cardiac Enzymes: No results found for this basename: CKTOTAL, CKMB, CKMBINDEX, TROPONINI,  in the last 168 hours BNP (last 3 results)  Recent Labs  01/06/14 1746 01/12/14 0525 05/17/14 1936  PROBNP 1290.0* 528.2* 832.1*   CBG:  Recent Labs Lab 05/19/14 0742 05/19/14 1154 05/19/14 1641 05/19/14 2200 05/20/14 0747  GLUCAP 133* 209* 238* 135* 125*    Recent Results (from  the past 240 hour(s))  CULTURE, BLOOD (ROUTINE X 2)     Status: None   Collection Time    05/18/14  1:10 AM      Result Value Ref Range Status   Specimen Description BLOOD LEFT ANTECUBITAL   Final   Special Requests BOTTLES DRAWN AEROBIC AND ANAEROBIC 10CC EA   Final   Culture  Setup Time     Final   Value: 05/18/2014 08:10     Performed at [facility]   Culture     Final   Value:        BLOOD CULTURE RECEIVED NO GROWTH TO DATE CULTURE WILL BE HELD FOR 5 DAYS BEFORE ISSUING A FINAL NEGATIVE REPORT     Performed at [facility]   Report Status PENDING    Incomplete  CULTURE, BLOOD (ROUTINE X 2)     Status: None   Collection Time    05/18/14  1:22 AM      Result Value Ref Range Status   Specimen Description BLOOD LEFT HAND   Final   Special Requests     Final   Value: BOTTLES DRAWN AEROBIC AND ANAEROBIC 10CC BLUE 5CC RED   Culture  Setup Time     Final   Value: 05/18/2014 08:10     Performed at [facility]   Culture     Final   Value:        BLOOD CULTURE RECEIVED NO GROWTH TO DATE CULTURE WILL BE HELD FOR 5 DAYS BEFORE ISSUING A FINAL NEGATIVE REPORT     Performed at [facility]   Report Status PENDING   Incomplete  CULTURE, EXPECTORATED SPUTUM-ASSESSMENT     Status: None   Collection Time    05/18/14  2:58 AM      Result Value Ref Range Status   Specimen Description SPUTUM   Final   Special Requests NONE   Final   Sputum evaluation     Final   Value: THIS SPECIMEN IS ACCEPTABLE. RESPIRATORY CULTURE REPORT TO FOLLOW.   Report Status 05/18/2014 FINAL   Final  CULTURE, RESPIRATORY (NON-EXPECTORATED)     Status: None   Collection Time    05/18/14  2:58 AM      Result Value Ref Range Status   Specimen Description SPUTUM   Final   Special Requests ADDED [phone]   Final   Gram Stain     Final   Value: ABUNDANT WBC PRESENT,BOTH PMN AND MONONUCLEAR     RARE SQUAMOUS EPITHELIAL CELLS PRESENT     RARE GRAM POSITIVE COCCI     IN PAIRS RARE GRAM POSITIVE RODS     Performed at [facility]   Culture     Final   Value: NORMAL OROPHARYNGEAL FLORA     Performed at [facility]   Report Status 05/20/2014 FINAL   Final     Studies: No results found.  Scheduled Meds: . azithromycin  500 mg Oral Daily  . ceFEPime (MAXIPIME) IV  1 g Intravenous Q12H  . diltiazem  120 mg Oral Daily  . enoxaparin (LOVENOX) injection  40 mg Subcutaneous Q24H  . feeding supplement (RESOURCE BREEZE)  1 Container Oral Q24H  . guaiFENesin  1,200 mg Oral BID  . insulin aspart  0-5 Units Subcutaneous QHS  . insulin aspart  0-9  Units Subcutaneous TID WC  . levalbuterol  0.63 mg Nebulization TID  . methylPREDNISolone (SOLU-MEDROL) injection  60 mg Intravenous Q8H  .  sodium chloride  3 mL Intravenous Q12H  . tiotropium  18 mcg Inhalation Daily   Continuous Infusions:   Principal Problem:   Acute on chronic respiratory failure Active Problems:   Bronchiectasis with acute exacerbation   GERD   HTN (hypertension)   COPD exacerbation   Protein-calorie malnutrition, severe    Time spent: >35 minutes     Esperanza Sheets  [facility] Pager [phone]. If 7PM-7AM, please contact night-coverage at [email or web address], password [facility] 05/20/2014, 9:26 AM  LOS: 3 days

## 2014-05-20 NOTE — Progress Notes (Signed)
Physical Therapy Treatment Patient Details Name: Stacy Novak MRN: [id number] DOB: 1941-06-11 Today's Date: 05/20/2014    History of Present Illness Stacy Novak  is a 73 y.o. female adm 05/17/14 with incr SOB, difficulty transferring to w/c;  PMHx: severe COPD, cystic bronchiectasis,  MAC,  FTT    PT Comments    Pt able to mobilize within room today for short distance at min (A) with RW. Pt appears to have been self limiting with mobility for past ~4 months. Pt stated multiple times "i just cant walk". Pt requires max encouragement but moves well when motivated. Goals updated due to progress with mobility. Pt requires rest breaks and cues for pursed lip breathing to remain > 90% on 3L O2.   Follow Up Recommendations  Home health PT;Supervision/Assistance - 24 hour     Equipment Recommendations  Rolling walker with 5" wheels    Recommendations for Other Services       Precautions / Restrictions Precautions Precautions: Fall Precaution Comments: watch O2 sats Restrictions Weight Bearing Restrictions: No    Mobility  Bed Mobility Overal bed mobility: Modified Independent             General bed mobility comments: incr time to get to EOB; HOB elevated and handrails used  Transfers Overall transfer level: Needs assistance Equipment used: Rolling walker (2 wheeled) Transfers: Sit to/from Stand Sit to Stand: Min guard         General transfer comment: cues for hand placement and safety with RW; min guard to steady  Ambulation/Gait Ambulation/Gait assistance: Min assist Ambulation Distance (Feet): 20 Feet (10' x 2) Assistive device: Rolling walker (2 wheeled) Gait Pattern/deviations: Decreased stride length;Step-through pattern;Shuffle;Narrow base of support;Trunk flexed Gait velocity: very decreased  Gait velocity interpretation: Below normal speed for age/gender General Gait Details: pt with LOB x 2; min (A) due to difficulty self correcting; cues for upright  posture and gt sequencing; sitting rest break due to decr activity tolerance   Stairs            Wheelchair Mobility    Modified Rankin (Stroke Patients Only)       Balance Overall balance assessment: Needs assistance;History of Falls Sitting-balance support: Feet supported;No upper extremity supported Sitting balance-Leahy Scale: Fair Sitting balance - Comments: sat EOB ~5 min to donn socks; no c/o balance   Standing balance support: During functional activity;Bilateral upper extremity supported Standing balance-Leahy Scale: Poor Standing balance comment: relies on RW for balance                     Cognition Arousal/Alertness: Awake/alert Behavior During Therapy: WFL for tasks assessed/performed Overall Cognitive Status: Within Functional Limits for tasks assessed                      Exercises General Exercises - Lower Extremity Ankle Circles/Pumps: AROM;Both;15 reps;Supine Long Arc Quad: AROM;Both;10 reps;Seated Hip Flexion/Marching: AROM;Both;10 reps;Seated    General Comments General comments (skin integrity, edema, etc.): monitor O2 sats; pt on 3L of O2       Pertinent Vitals/Pain Pain Assessment: No/denies pain    Home Living                      Prior Function            PT Goals (current goals can now be found in the care plan section) Acute Rehab PT Goals Patient Stated Goal: i want to walk again  PT  Goal Formulation: With patient Time For Goal Achievement: 05/25/14 Potential to Achieve Goals: Good Progress towards PT goals: Progressing toward goals    Frequency  Min 3X/week    PT Plan Current plan remains appropriate;Frequency needs to be updated    Co-evaluation             End of Session Equipment Utilized During Treatment: Gait belt;Oxygen Activity Tolerance: Patient limited by fatigue Patient left: in chair;with call bell/phone within reach     Time: 1413-1439 PT Time Calculation (min): 26  min  Charges:  $Gait Training: 8-22 mins $Therapeutic Exercise: 8-22 mins                    G Codes:      Stacy Novak, Stacy Novak, [place]  [phone] 05/20/2014, 4:50 PM

## 2014-05-21 DIAGNOSIS — J441 Chronic obstructive pulmonary disease with (acute) exacerbation: Secondary | ICD-10-CM

## 2014-05-21 DIAGNOSIS — J9621 Acute and chronic respiratory failure with hypoxia: Secondary | ICD-10-CM

## 2014-05-21 DIAGNOSIS — J471 Bronchiectasis with (acute) exacerbation: Principal | ICD-10-CM

## 2014-05-21 DIAGNOSIS — I10 Essential (primary) hypertension: Secondary | ICD-10-CM

## 2014-05-21 LAB — BASIC METABOLIC PANEL
Anion gap: 8 (ref 5–15)
BUN: 21 mg/dL (ref 6–23)
CHLORIDE: 99 meq/L (ref 96–112)
CO2: 32 meq/L (ref 19–32)
Calcium: 8.3 mg/dL — ABNORMAL LOW (ref 8.4–10.5)
Creatinine, Ser: 0.33 mg/dL — ABNORMAL LOW (ref 0.50–1.10)
GFR calc Af Amer: >90 mL/min (ref >90)
GFR calc non Af Amer: >90 mL/min (ref >90)
GLUCOSE: 139 mg/dL — AB (ref 70–99)
POTASSIUM: 4.5 meq/L (ref 3.7–5.3)
Sodium: 139 mEq/L (ref 137–147)

## 2014-05-21 LAB — GLUCOSE, CAPILLARY
GLUCOSE-CAPILLARY: 216 mg/dL — AB (ref 70–99)
GLUCOSE-CAPILLARY: 194 mg/dL — AB (ref 70–99)
Glucose-Capillary: 108 mg/dL — ABNORMAL HIGH (ref 70–99)
Glucose-Capillary: 117 mg/dL — ABNORMAL HIGH (ref 70–99)
Glucose-Capillary: 130 mg/dL — ABNORMAL HIGH (ref 70–99)

## 2014-05-21 LAB — CBC
HCT: 39.2 % (ref 36.0–46.0)
HEMOGLOBIN: 11.9 g/dL — AB (ref 12.0–15.0)
MCH: 26.7 pg (ref 26.0–34.0)
MCHC: 30.4 g/dL (ref 30.0–36.0)
MCV: 88.1 fL (ref 78.0–100.0)
Platelets: 240 10*3/uL (ref 150–400)
RBC: 4.45 MIL/uL (ref 3.87–5.11)
RDW: 13.7 % (ref 11.5–15.5)
WBC: 7.3 10*3/uL (ref 4.0–10.5)

## 2014-05-21 NOTE — Care Management Note (Signed)
  Page 1 of 1   05/25/2014     8:08:46 AM CARE MANAGEMENT NOTE 05/25/2014  Patient:  [name],Liya B   Account Number:  [id number]  Date Initiated:  05/21/2014  Documentation initiated by:  Ronny FlurryWILE,Yuchen Fedor  Subjective/Objective Assessment:     Action/Plan:   Anticipated DC Date:     Anticipated DC Plan:  HOME W HOME HEALTH SERVICES         Choice offered to / List presented to:  C-1 Patient        HH arranged  HH-1 RN  HH-2 PT      [facility]   Status of service:  Completed, signed off Medicare Important Message given?  YES (If response is "NO", the following Medicare IM given date fields will be blank) Date Medicare IM given:  05/21/2014 Medicare IM given by:  Ronny FlurryWILE,Laaibah Wartman Date Additional Medicare IM given:   Additional Medicare IM given by:    Discharge Disposition:  HOME W HOME HEALTH SERVICES  Per UR Regulation:  Reviewed for med. necessity/level of care/duration of stay  If discussed at Long Length of Stay Meetings, dates discussed:    Comments:  05-25-14 Post discharge , received consult to respiratory Care . Called [facility] left voice mail. Ronny FlurryHeather Ilayda Toda RN BSN [phone]    05-21-14 Spoke to patient , her husband and son at bedside. [facility] [place] [facility] . [facility] has had [facility] in the past and would like [facility] again . Referral given to [facility] .  Patient has home oxygen through [employer] . Patient , her husband and son states that she has portable tank that they will provide when patient is discharge , for ride home.  Patient already has walker and 3 in 1 at home. Ronny FlurryHeather Sanii Kukla BSN RN

## 2014-05-21 NOTE — Progress Notes (Signed)
  The patient does not want the test repeated. We have an echo in Epic dated 09/10/13. Doctor will cancel.  Arvil ChacoFoster, Ollivander See 05/21/2014, 3:17 PM

## 2014-05-21 NOTE — Progress Notes (Signed)
ANTIBIOTIC CONSULT NOTE - FOLLOW UP  Pharmacy Consult:  Cefepime Indication:  HCAP  Allergies  Allergen Reactions  . Daliresp [Roflumilast] Other (See Comments)    Mental changes, feeling weird  . Doxycycline Other (See Comments)    Bad taste in mouth, "salty coating"  . Levaquin [Levofloxacin] Palpitations and Other (See Comments)    Joint pains/aches  . Other Other (See Comments)    DAIRY PRODUCTS-causes thick mucous secretions  . Sulfonamide Derivatives Nausea Only    Patient Measurements: Height: 5\' 7"  (170.2 cm) Weight: 116 lb 3.2 oz (52.708 kg) IBW/kg (Calculated) : 61.6  Vital Signs: Temp: 95.6 F (35.3 C) (10/01 1048) Temp src: Oral (10/01 1048) BP: 136/53 mmHg (10/01 1048) Pulse Rate: 68 (10/01 1048) Intake/Output from previous day: 09/30 0701 - 10/01 0700 In: -  Out: 1225 [Urine:1225] Intake/Output from this shift: Total I/O In: -  Out: 150 [Urine:150]  Labs:  Recent Labs  05/20/14 0459 05/21/14 0531  WBC 9.8 7.3  HGB 12.0 11.9*  PLT 255 240  CREATININE 0.36* 0.33*   Estimated Creatinine Clearance: 50.5 ml/min (by C-G formula based on Cr of 0.33). No results found for this basename: VANCOTROUGH, Leodis Binet, VANCORANDOM, GENTTROUGH, GENTPEAK, GENTRANDOM, TOBRATROUGH, TOBRAPEAK, TOBRARND, AMIKACINPEAK, AMIKACINTROU, AMIKACIN,  in the last 72 hours   Microbiology: Recent Results (from the past 720 hour(s))  CULTURE, BLOOD (ROUTINE X 2)     Status: None   Collection Time    05/18/14  1:10 AM      Result Value Ref Range Status   Specimen Description BLOOD LEFT ANTECUBITAL   Final   Special Requests BOTTLES DRAWN AEROBIC AND ANAEROBIC 10CC EA   Final   Culture  Setup Time     Final   Value: 05/18/2014 08:10     Performed at [facility]   Culture     Final   Value:        BLOOD CULTURE RECEIVED NO GROWTH TO DATE CULTURE WILL BE HELD FOR 5 DAYS BEFORE ISSUING A FINAL NEGATIVE REPORT     Performed at [facility]   Report Status  PENDING   Incomplete  CULTURE, BLOOD (ROUTINE X 2)     Status: None   Collection Time    05/18/14  1:22 AM      Result Value Ref Range Status   Specimen Description BLOOD LEFT HAND   Final   Special Requests     Final   Value: BOTTLES DRAWN AEROBIC AND ANAEROBIC 10CC BLUE 5CC RED   Culture  Setup Time     Final   Value: 05/18/2014 08:10     Performed at [facility]   Culture     Final   Value:        BLOOD CULTURE RECEIVED NO GROWTH TO DATE CULTURE WILL BE HELD FOR 5 DAYS BEFORE ISSUING A FINAL NEGATIVE REPORT     Performed at [facility]   Report Status PENDING   Incomplete  CULTURE, EXPECTORATED SPUTUM-ASSESSMENT     Status: None   Collection Time    05/18/14  2:58 AM      Result Value Ref Range Status   Specimen Description SPUTUM   Final   Special Requests NONE   Final   Sputum evaluation     Final   Value: THIS SPECIMEN IS ACCEPTABLE. RESPIRATORY CULTURE REPORT TO FOLLOW.   Report Status 05/18/2014 FINAL   Final  CULTURE, RESPIRATORY (NON-EXPECTORATED)     Status: None  Collection Time    05/18/14  2:58 AM      Result Value Ref Range Status   Specimen Description SPUTUM   Final   Special Requests ADDED [id number]   Final   Gram Stain     Final   Value: ABUNDANT WBC PRESENT,BOTH PMN AND MONONUCLEAR     RARE SQUAMOUS EPITHELIAL CELLS PRESENT     RARE GRAM POSITIVE COCCI     IN PAIRS RARE GRAM POSITIVE RODS     [facility]   Culture     Final   Value: NORMAL OROPHARYNGEAL FLORA     [facility]   Report Status 05/20/2014 FINAL   Final      Assessment: [age] YOF with HCAP to continue on cefepime and azithromycin.  Patient's renal function has been stable.  She is afebrile and her WBC is WNL.  Cefepime 9/28 >> Azithro 9/28 >>  9/28 Sputum - NOF 9/28 Blood - NGTD   Goal of Therapy:  Clearance of infection   Plan:  - Continue Cefepime 1gm IV Q12H - Azithromycin per MD - Monitor renal fxn, clinical  progress - F/U abx LOT and change to PO    Jovonda Selner D. Laney Potashang, PharmD, BCPS Pager:  [phone] 05/21/2014, 12:03 PM

## 2014-05-21 NOTE — Progress Notes (Signed)
[facility] PROGRESS NOTE  Stacy Novak WUJ:[id number] DOB: 08-22-1940 DOA: 05/17/2014 PCP: Rene Paci, MD  Assessment/Plan: 73y/o female with PMH of End Stage Lung Disease, COPD, Pulmonary fibrosis, Cystic Bronchiectasis, Cchronic respiratory failure, Pulmonary mycobacterial infection (treated) presented with increasing shortness of breath in the last few days, states not able to walk further than one or 2 steps due to dyspnea.   1. Acute on chronic respiratory failure due to advanced lung disease;  -Patient has a history of severe COPD with home O2 3 L, history of mycobacterial infection of lungs, chronic cystic bronchiectasis. Followed outpatient by Dr. Ninetta Lights and Dr. Maple Hudson  -Continue Zithromax, cefepime, scheduled nebs, taper IV steroids, O2, Mucinex  -Pulmonology consulted, patient was seen by Dr. Molli Knock, recommended CT scan of the chest. Patient was unable to tolerate CT scan and unable to lie flat.  -Blood cultures: NGTD;  urine strep antigen negative   -BNP elevated at 832,(2-D echo in 2013 head showed EF of 55%) no clinical s/s of acute CHF -check echo-pend    2. Bronchiectasis with acute exacerbation  Management as #1  3. GERD  Continue PPI  4. HTN (hypertension)  Currently stable, continue Cardizem  5. History of B12 deficiency/anemia On vitamin B12 injections monthly  6. FAILURE TO THRIVE  PT eval, nutrition consult   Code Status: full Family Communication: d/w patient (indicate person spoken with, relationship, and if by phone, the number) Disposition Plan: home 24-48 hrs    Consultants:  Pulmonology   Procedures:  none  Antibiotics:  azithro 9/28<<  Cefepime 9/28<<<   (indicate start date, and stop date if known)  HPI/Subjective: alert  Objective: Filed Vitals:   05/21/14 0535  BP: 121/64  Pulse: 69  Temp: 97.6 F (36.4 C)  Resp: 16    Intake/Output Summary (Last 24 hours) at 05/21/14 1050 Last data filed at 05/21/14 1023  Gross  per 24 hour  Intake      0 ml  Output    925 ml  Net   -925 ml   Filed Weights   05/17/14 2359  Weight: 52.708 kg (116 lb 3.2 oz)    Exam:   General:  alert  Cardiovascular: s1,s2 rrr  Respiratory: BL fine crackles   Abdomen: soft, nt,nd   Musculoskeletal: no LE edeam   Data Reviewed: Basic Metabolic Panel:  Recent Labs Lab 05/17/14 1936 05/18/14 0122 05/20/14 0459 05/21/14 0531  NA 138 137 139 139  K 4.4 4.2 4.4 4.5  CL 94* 94* 99 99  CO2 36* 32 35* 32  GLUCOSE 96 217* 141* 139*  BUN 14 16 26* 21  CREATININE 0.39* 0.36* 0.36* 0.33*  CALCIUM 9.0 8.8 8.5 8.3*   Liver Function Tests: No results found for this basename: AST, ALT, ALKPHOS, BILITOT, PROT, ALBUMIN,  in the last 168 hours No results found for this basename: LIPASE, AMYLASE,  in the last 168 hours No results found for this basename: AMMONIA,  in the last 168 hours CBC:  Recent Labs Lab 05/17/14 1936 05/20/14 0459 05/21/14 0531  WBC 7.5 9.8 7.3  NEUTROABS 5.6  --   --   HGB 13.4 12.0 11.9*  HCT 42.5 39.4 39.2  MCV 88.4 88.5 88.1  PLT 239 255 240   Cardiac Enzymes: No results found for this basename: CKTOTAL, CKMB, CKMBINDEX, TROPONINI,  in the last 168 hours BNP (last 3 results)  Recent Labs  01/06/14 1746 01/12/14 0525 05/17/14 1936  PROBNP 1290.0* 528.2* 832.1*   CBG:  Recent Labs Lab 05/20/14 0747 05/20/14 1205 05/20/14 1629 05/20/14 2155 05/21/14 0813  GLUCAP 125* 123* 147* 108* 117*    Recent Results (from the past 240 hour(s))  CULTURE, BLOOD (ROUTINE X 2)     Status: None   Collection Time    05/18/14  1:10 AM      Result Value Ref Range Status   Specimen Description BLOOD LEFT ANTECUBITAL   Final   Special Requests BOTTLES DRAWN AEROBIC AND ANAEROBIC 10CC EA   Final   Culture  Setup Time     Final   Value: 05/18/2014 08:10     Performed at [facility]   Culture     Final   Value:        BLOOD CULTURE RECEIVED NO GROWTH TO DATE CULTURE WILL BE HELD  FOR 5 DAYS BEFORE ISSUING A FINAL NEGATIVE REPORT     Performed at [facility]   Report Status PENDING   Incomplete  CULTURE, BLOOD (ROUTINE X 2)     Status: None   Collection Time    05/18/14  1:22 AM      Result Value Ref Range Status   Specimen Description BLOOD LEFT HAND   Final   Special Requests     Final   Value: BOTTLES DRAWN AEROBIC AND ANAEROBIC 10CC BLUE 5CC RED   Culture  Setup Time     Final   Value: 05/18/2014 08:10     Performed at [facility]   Culture     Final   Value:        BLOOD CULTURE RECEIVED NO GROWTH TO DATE CULTURE WILL BE HELD FOR 5 DAYS BEFORE ISSUING A FINAL NEGATIVE REPORT     Performed at [facility]   Report Status PENDING   Incomplete  CULTURE, EXPECTORATED SPUTUM-ASSESSMENT     Status: None   Collection Time    05/18/14  2:58 AM      Result Value Ref Range Status   Specimen Description SPUTUM   Final   Special Requests NONE   Final   Sputum evaluation     Final   Value: THIS SPECIMEN IS ACCEPTABLE. RESPIRATORY CULTURE REPORT TO FOLLOW.   Report Status 05/18/2014 FINAL   Final  CULTURE, RESPIRATORY (NON-EXPECTORATED)     Status: None   Collection Time    05/18/14  2:58 AM      Result Value Ref Range Status   Specimen Description SPUTUM   Final   Special Requests ADDED [phone]   Final   Gram Stain     Final   Value: ABUNDANT WBC PRESENT,BOTH PMN AND MONONUCLEAR     RARE SQUAMOUS EPITHELIAL CELLS PRESENT     RARE GRAM POSITIVE COCCI     IN PAIRS RARE GRAM POSITIVE RODS     Performed at [facility]   Culture     Final   Value: NORMAL OROPHARYNGEAL FLORA     Performed at [facility]   Report Status 05/20/2014 FINAL   Final     Studies: No results found.  Scheduled Meds: . azithromycin  500 mg Oral Daily  . ceFEPime (MAXIPIME) IV  1 g Intravenous Q12H  . diltiazem  120 mg Oral Daily  . enoxaparin (LOVENOX) injection  40 mg Subcutaneous Q24H  . feeding supplement (RESOURCE BREEZE)  1 Container  Oral Q24H  . guaiFENesin  1,200 mg Oral BID  . insulin aspart  0-5 Units Subcutaneous QHS  .  insulin aspart  0-9 Units Subcutaneous TID WC  . levalbuterol  0.63 mg Nebulization TID  . methylPREDNISolone (SOLU-MEDROL) injection  60 mg Intravenous Q8H  . sodium chloride  3 mL Intravenous Q12H  . tiotropium  18 mcg Inhalation Daily   Continuous Infusions:   Principal Problem:   Acute on chronic respiratory failure Active Problems:   Bronchiectasis with acute exacerbation   GERD   HTN (hypertension)   COPD exacerbation   Protein-calorie malnutrition, severe    Time spent: >35 minutes     Esperanza SheetsBURIEV, Ariyanah Aguado N  [facility] Pager [phone]. If 7PM-7AM, please contact night-coverage at [email or web address], password [email or web address] 05/21/2014, 10:50 AM  LOS: 4 days

## 2014-05-21 NOTE — Progress Notes (Signed)
The patient's heart is not well visualized per previous echo. Please reorder with contrast.   Arvil ChacoFoster, Migel Hannis 05/21/2014, 2:41 PM

## 2014-05-21 NOTE — Progress Notes (Signed)
Patient called RN to room to ask about 2D Echo being covered by insurance.  Made multiple phone calls to find out this information, however, unable to determine at this time.  Patient refuses to have Echo until she knows whether or not it will be covered. Dr. York SpanielBuriev made aware.

## 2014-05-22 DIAGNOSIS — R0902 Hypoxemia: Secondary | ICD-10-CM

## 2014-05-22 LAB — GLUCOSE, CAPILLARY
GLUCOSE-CAPILLARY: 123 mg/dL — AB (ref 70–99)
Glucose-Capillary: 123 mg/dL — ABNORMAL HIGH (ref 70–99)
Glucose-Capillary: 100 mg/dL — ABNORMAL HIGH (ref 70–99)
Glucose-Capillary: 138 mg/dL — ABNORMAL HIGH (ref 70–99)

## 2014-05-22 MED ORDER — FUROSEMIDE 20 MG PO TABS
10.0000 mg | ORAL_TABLET | Freq: Every day | ORAL | Status: DC
  Administered 2014-05-22: 10 mg via ORAL
  Filled 2014-05-22: qty 1
  Filled 2014-05-22: qty 0.5

## 2014-05-22 MED ORDER — PREDNISONE 20 MG PO TABS
20.0000 mg | ORAL_TABLET | Freq: Every day | ORAL | Status: DC
  Administered 2014-05-23: 20 mg via ORAL
  Filled 2014-05-22 (×3): qty 1

## 2014-05-22 MED ORDER — POTASSIUM CHLORIDE CRYS ER 20 MEQ PO TBCR
20.0000 meq | EXTENDED_RELEASE_TABLET | Freq: Every day | ORAL | Status: DC
  Administered 2014-05-22: 20 meq via ORAL
  Filled 2014-05-22 (×2): qty 1

## 2014-05-22 NOTE — Progress Notes (Signed)
[facility] PROGRESS NOTE  Stacy Novak:[id number] DOB: 22-Oct-1940 DOA: 05/17/2014 PCP: Rene Paci, MD  Assessment/Plan: 73y/o female with PMH of End Stage Lung Disease, COPD, Pulmonary fibrosis, Cystic Bronchiectasis, Cchronic respiratory failure, Pulmonary mycobacterial infection (treated) presented with increasing shortness of breath in the last few days, states not able to walk further than one or 2 steps due to dyspnea.   1. Acute on chronic respiratory failure due to advanced lung disease;  -Patient has a history of severe COPD with home O2 3 L, history of mycobacterial infection of lungs, chronic cystic bronchiectasis. Followed outpatient by Dr. Ninetta Lights and Dr. Maple Hudson  -Pulmonology consulted, patient was seen by Dr. Molli Knock, recommended CT scan of the chest. Patient was unable to tolerate CT scan and unable to lie flat.  -Blood cultures: NGTD;  urine strep antigen negative, afebrile; d/c cefepime; cont zythromax; taper steroids; cont bronchodilators   2. 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 Bronchiectasis with acute exacerbation  Management as #1  3. GERD  Continue PPI  4. HTN (hypertension)  Currently stable, continue Cardizem  5. History of B12 deficiency/anemia On vitamin B12 injections monthly  6. FAILURE TO THRIVE  PT eval, nutrition consult 7. Pt reports diarrhea; but only 1 stool (she has sevral BM daily at home, but not diarrhea) -monitor, d/c cefepime; if diarrhea will check for c diff   Code Status: full Family Communication: d/w patient (indicate person spoken with, relationship, and if by phone, the number) Disposition Plan: home HHC: 10/3   Consultants:  Pulmonology   Procedures:  none  Antibiotics:  azithro 9/28<<  Cefepime 9/28<<<   (indicate start date, and stop date if  known)  HPI/Subjective: alert  Objective: Filed Vitals:   05/22/14 0536  BP: 119/77  Pulse: 102  Temp: 97.3 F (36.3 C)  Resp: 17    Intake/Output Summary (Last 24 hours) at 05/22/14 1047 Last data filed at 05/22/14 8119  Gross per 24 hour  Intake    740 ml  Output    650 ml  Net     90 ml   Filed Weights   05/17/14 2359  Weight: 52.708 kg (116 lb 3.2 oz)    Exam:   General:  alert  Cardiovascular: s1,s2 rrr  Respiratory: BL fine crackles   Abdomen: soft, nt,nd   Musculoskeletal: no LE edeam   Data Reviewed: Basic Metabolic Panel:  Recent Labs Lab 05/17/14 1936 05/18/14 0122 05/20/14 0459 05/21/14 0531  NA 138 137 139 139  K 4.4 4.2 4.4 4.5  CL 94* 94* 99 99  CO2 36* 32 35* 32  GLUCOSE 96 217* 141* 139*  BUN 14 16 26* 21  CREATININE 0.39* 0.36* 0.36* 0.33*  CALCIUM 9.0 8.8 8.5 8.3*   Liver Function Tests: No results found for this basename: AST, ALT, ALKPHOS, BILITOT, PROT, ALBUMIN,  in the last 168 hours No results found for this basename: LIPASE, AMYLASE,  in the last 168 hours No results found for this basename: AMMONIA,  in the last 168 hours CBC:  Recent Labs Lab 05/17/14 1936 05/20/14 0459 05/21/14 0531  WBC 7.5 9.8 7.3  NEUTROABS 5.6  --   --   HGB 13.4 12.0 11.9*  HCT 42.5 39.4 39.2  MCV 88.4 88.5 88.1  PLT 239 255 240   Cardiac Enzymes: No results found  for this basename: CKTOTAL, CKMB, CKMBINDEX, TROPONINI,  in the last 168 hours BNP (last 3 results)  Recent Labs  01/06/14 1746 01/12/14 0525 05/17/14 1936  PROBNP 1290.0* 528.2* 832.1*   CBG:  Recent Labs Lab 05/21/14 0813 05/21/14 1149 05/21/14 1654 05/21/14 2155 05/22/14 0715  GLUCAP 117* 130* 216* 194* 123*    Recent Results (from the past 240 hour(s))  CULTURE, BLOOD (ROUTINE X 2)     Status: None   Collection Time    05/18/14  1:10 AM      Result Value Ref Range Status   Specimen Description BLOOD LEFT ANTECUBITAL   Final   Special Requests BOTTLES  DRAWN AEROBIC AND ANAEROBIC 10CC EA   Final   Culture  Setup Time     Final   Value: 05/18/2014 08:10     Performed at [facility]   Culture     Final   Value:        BLOOD CULTURE RECEIVED NO GROWTH TO DATE CULTURE WILL BE HELD FOR 5 DAYS BEFORE ISSUING A FINAL NEGATIVE REPORT     Performed at [facility]   Report Status PENDING   Incomplete  CULTURE, BLOOD (ROUTINE X 2)     Status: None   Collection Time    05/18/14  1:22 AM      Result Value Ref Range Status   Specimen Description BLOOD LEFT HAND   Final   Special Requests     Final   Value: BOTTLES DRAWN AEROBIC AND ANAEROBIC 10CC BLUE 5CC RED   Culture  Setup Time     Final   Value: 05/18/2014 08:10     Performed at [facility]   Culture     Final   Value:        BLOOD CULTURE RECEIVED NO GROWTH TO DATE CULTURE WILL BE HELD FOR 5 DAYS BEFORE ISSUING A FINAL NEGATIVE REPORT     Performed at [facility]   Report Status PENDING   Incomplete  CULTURE, EXPECTORATED SPUTUM-ASSESSMENT     Status: None   Collection Time    05/18/14  2:58 AM      Result Value Ref Range Status   Specimen Description SPUTUM   Final   Special Requests NONE   Final   Sputum evaluation     Final   Value: THIS SPECIMEN IS ACCEPTABLE. RESPIRATORY CULTURE REPORT TO FOLLOW.   Report Status 05/18/2014 FINAL   Final  CULTURE, RESPIRATORY (NON-EXPECTORATED)     Status: None   Collection Time    05/18/14  2:58 AM      Result Value Ref Range Status   Specimen Description SPUTUM   Final   Special Requests ADDED [phone]   Final   Gram Stain     Final   Value: ABUNDANT WBC PRESENT,BOTH PMN AND MONONUCLEAR     RARE SQUAMOUS EPITHELIAL CELLS PRESENT     RARE GRAM POSITIVE COCCI     IN PAIRS RARE GRAM POSITIVE RODS     Performed at [facility]   Culture     Final   Value: NORMAL OROPHARYNGEAL FLORA     Performed at [facility]   Report Status 05/20/2014 FINAL   Final     Studies: No results  found.  Scheduled Meds: . azithromycin  500 mg Oral Daily  . diltiazem  120 mg Oral Daily  . enoxaparin (LOVENOX) injection  40 mg Subcutaneous Q24H  . feeding supplement (  RESOURCE BREEZE)  1 Container Oral Q24H  . guaiFENesin  1,200 mg Oral BID  . insulin aspart  0-5 Units Subcutaneous QHS  . insulin aspart  0-9 Units Subcutaneous TID WC  . levalbuterol  0.63 mg Nebulization TID  . methylPREDNISolone (SOLU-MEDROL) injection  60 mg Intravenous Q8H  . sodium chloride  3 mL Intravenous Q12H  . tiotropium  18 mcg Inhalation Daily   Continuous Infusions:   Principal Problem:   Acute on chronic respiratory failure Active Problems:   Bronchiectasis with acute exacerbation   GERD   HTN (hypertension)   COPD exacerbation   Protein-calorie malnutrition, severe    Time spent: >35 minutes     Esperanza SheetsBURIEV, Raji Glinski N  [facility] Pager [phone]. If 7PM-7AM, please contact night-coverage at [email or web address], password [email or web address] 05/22/2014, 10:47 AM  LOS: 5 days

## 2014-05-22 NOTE — Progress Notes (Signed)
Physical Therapy Treatment Patient Details Name: Stacy Novak MRN: [id number] DOB: [birth year] Today's Date: 05/22/2014    History of Present Illness Stacy Novak  is a 73 y.o. female adm 05/17/14 with incr SOB, difficulty transferring to w/c;  PMHx: severe COPD, cystic bronchiectasis,  MAC,  FTT    PT Comments    SATURATION QUALIFICATIONS: (This note is used to comply with regulatory documentation for home oxygen)  Patient Saturations on Room Air at Rest = 85%  Patient Saturations on Room Air while Ambulating = 88%  Patient Saturations on 3 Liters of oxygen while Ambulating = 94%  Please briefly explain why patient needs home oxygen: in order to achieve therapeutic levels    Pt was able to increase her amb distance and activity level but still very limited requiring freq rest breaks and purse lip breathing.      Follow Up Recommendations  Home health PT;Supervision/Assistance - 24 hour     Equipment Recommendations  Rolling walker with 5" wheels    Recommendations for Other Services       Precautions / Restrictions Precautions Precautions: Fall Precaution Comments: home O2   3 lts Restrictions Weight Bearing Restrictions: No    Mobility  Bed Mobility Overal bed mobility: Modified Independent Bed Mobility: Supine to Sit;Sit to Supine     Supine to sit: Modified independent (Device/Increase time) Sit to supine: Modified independent (Device/Increase time)   General bed mobility comments: incr time to get to EOB; HOB elevated and handrails used  Transfers Overall transfer level: Needs assistance Equipment used: Rolling walker (2 wheeled) Transfers: Sit to/from Stand Sit to Stand: Min guard         General transfer comment: 50% VC's on proper hand placement and increased time  Ambulation/Gait Ambulation/Gait assistance: Min assist Ambulation Distance (Feet): 80 Feet (25', 25' 30' sitting rest breaks between) Assistive device: Rolling walker (2  wheeled) Gait Pattern/deviations: Step-through pattern;Decreased stride length;Trunk flexed;Narrow base of support Gait velocity: very decreased    General Gait Details: amb on 3 lts O2 limited distance.  Spouse and son assisted by following with recliner and IVpole/O2 tank.  Pt instructed on purse lip breathing and enegy conservation.   Stairs            Wheelchair Mobility    Modified Rankin (Stroke Patients Only)       Balance                                    Cognition                            Exercises      General Comments        Pertinent Vitals/Pain      Home Living                      Prior Function            PT Goals (current goals can now be found in the care plan section) Progress towards PT goals: Progressing toward goals    Frequency  Min 3X/week    PT Plan      Co-evaluation             End of Session Equipment Utilized During Treatment: Gait belt;Oxygen Activity Tolerance: Patient limited by fatigue;Other (comment) (dyspnea) Patient left: in chair;with call bell/phone within  reach     Time: 1058-1130 PT Time Calculation (min): 32 min  Charges:  $Gait Training: 8-22 mins $Therapeutic Activity: 8-22 mins                    G Codes:      Stacy Novak  PTA [facility]  Acute  Rehab Pager      [phone]

## 2014-05-23 DIAGNOSIS — R627 Adult failure to thrive: Secondary | ICD-10-CM

## 2014-05-23 LAB — GLUCOSE, CAPILLARY
GLUCOSE-CAPILLARY: 85 mg/dL (ref 70–99)
Glucose-Capillary: 96 mg/dL (ref 70–99)

## 2014-05-23 MED ORDER — POTASSIUM CHLORIDE ER 10 MEQ PO TBCR: 10.0000 meq | EXTENDED_RELEASE_TABLET | ORAL | Status: DC

## 2014-05-23 MED ORDER — FUROSEMIDE 20 MG PO TABS: 20.0000 mg | ORAL_TABLET | ORAL | Status: DC

## 2014-05-23 MED ORDER — PROMETHAZINE HCL 12.5 MG RE SUPP: 12.5000 mg | Freq: Four times a day (QID) | RECTAL | Status: DC | PRN

## 2014-05-23 MED ORDER — AZITHROMYCIN 250 MG PO TABS: 250.0000 mg | ORAL_TABLET | Freq: Every day | ORAL | Status: DC

## 2014-05-23 MED ORDER — PREDNISONE 10 MG PO TABS: 10.0000 mg | ORAL_TABLET | Freq: Every day | ORAL | Status: DC

## 2014-05-23 NOTE — Progress Notes (Signed)
Discharge instructions and prescriptions given to patient with teachback. Tolerated food well. In no distress.Discharged via wheelchair to husband and son's care with NT present. Trina Aoarla Artice Bergerson, RN

## 2014-05-23 NOTE — Discharge Instructions (Signed)
Please follow up with pulmonology in 2 weeks

## 2014-05-23 NOTE — Discharge Summary (Signed)
Physician Discharge Summary  Stacy Novak AVW:[id number] DOB: 12-25-1940 DOA: 05/17/2014  PCP: Rene Paci, MD  Admit date: 05/17/2014 Discharge date: 05/23/2014  Time spent: >35 minutes  Recommendations for Outpatient Follow-up:  HHC F/u with pulmonology in 2 weeks F/u with PCP in 1 week   Discharge Diagnoses:  Principal Problem:   Acute on chronic respiratory failure Active Problems:   Bronchiectasis with acute exacerbation   GERD   HTN (hypertension)   COPD exacerbation   Protein-calorie malnutrition, severe   Discharge Condition: stable   Diet recommendation: low sodium   Filed Weights   05/17/14 2359  Weight: 52.708 kg (116 lb 3.2 oz)    History of present illness:  73y/o female with PMH of End Stage Lung Disease, COPD, Pulmonary fibrosis, Cystic Bronchiectasis, Cchronic respiratory failure, Pulmonary mycobacterial infection (treated) presented with increasing shortness of breath in the last few days, states not able to walk further than one or 2 steps due to dyspnea.    Hospital Course:  1. Acute on chronic respiratory failure due to advanced lung disease;  -Patient has a history of severe COPD with home O2 3 L, history of mycobacterial infection of lungs, chronic cystic bronchiectasis. Followed outpatient by Dr. Ninetta Lights and Dr. Maple Hudson  -Pulmonology consulted, patient was seen by Dr. Molli Knock, recommended CT scan of the chest. Patient was unable to tolerate CT scan and unable to lie flat.  -Blood cultures: NGTD; urine strep antigen negative, afebrile; d/c cefepime; cont zythromax; taper steroids; cont bronchodilators  -outpatient follow up with pulmonology in 2 weeks  2. Chronic R sided HF, pulmonary HTN likely due ot advanced lung disease;  -BNP elevated at 832; started low dose lasix PO, + KCL; outpatient follow up with BMP in 3-5 days  3. Bronchiectasis with acute exacerbation Management as #1  3. GERD Continue PPI  4. HTN (hypertension) Currently stable,  continue Cardizem  5. History of B12 deficiency/anemia On vitamin B12 injections monthly  6. FAILURE TO THRIVE PT eval, nutrition consult  7. Pt reports diarrhea; but only 1 stool (she has sevral BM daily at home, but not diarrhea)  -monitor, d/c cefepime; diarrhea resolved   Prognosis is guarded with advanced lung disease, chronic respiratory failure   Procedures:  none (i.e. Studies not automatically included, echos, thoracentesis, etc; not x-rays)  Consultations:  Pulmonology   Discharge Exam: Filed Vitals:   05/23/14 0513  BP: 128/70  Pulse: 73  Temp: 98 F (36.7 C)  Resp: 17    General: alert Cardiovascular: s1,s2 rrr Respiratory: crackles improved   Discharge Instructions  Discharge Instructions   Diet - low sodium heart healthy    Complete by:  As directed      Discharge instructions    Complete by:  As directed   Please follow up with pulmonology in 2 week s     Increase activity slowly    Complete by:  As directed             Medication List         azithromycin 250 MG tablet  Commonly known as:  ZITHROMAX Z-PAK  Take 1 tablet (250 mg total) by mouth daily. 2 po on day 1 then 1 tab po on days 2-5     calcium carbonate 500 MG chewable tablet  Commonly known as:  TUMS - dosed in mg elemental calcium  Chew 1 tablet by mouth daily as needed for indigestion or heartburn.     cyanocobalamin 1000 MCG/ML injection  Commonly  known as:  (VITAMIN B-12)  Inject 1 mL (1,000 mcg total) into the muscle every 30 (thirty) days.     diltiazem 120 MG 24 hr capsule  Commonly known as:  CARDIZEM CD  Take 1 capsule (120 mg total) by mouth daily.     furosemide 20 MG tablet  Commonly known as:  LASIX  Take 1 tablet (20 mg total) by mouth every other day.     ipratropium 0.02 % nebulizer solution  Commonly known as:  ATROVENT  Take 2.5 mLs (0.5 mg total) by nebulization 3 (three) times daily.     levalbuterol 0.63 MG/3ML nebulizer solution  Commonly known as:   XOPENEX  Take 3 mLs (0.63 mg total) by nebulization every 3 (three) hours as needed for wheezing or shortness of breath.     potassium chloride 10 MEQ tablet  Commonly known as:  K-DUR  Take 1 tablet (10 mEq total) by mouth every other day.     predniSONE 10 MG tablet  Commonly known as:  DELTASONE  Take 1 tablet (10 mg total) by mouth daily with breakfast.     SYSTANE 0.4-0.3 % Soln  Generic drug:  Polyethyl Glycol-Propyl Glycol  Place 1 drop into both eyes daily as needed (for dry eyes).       Allergies  Allergen Reactions  . Daliresp [Roflumilast] Other (See Comments)    Mental changes, feeling weird  . Doxycycline Other (See Comments)    Bad taste in mouth, "salty coating"  . Levaquin [Levofloxacin] Palpitations and Other (See Comments)    Joint pains/aches  . Other Other (See Comments)    DAIRY PRODUCTS-causes thick mucous secretions  . Sulfonamide Derivatives Nausea Only       Follow-up Information   Follow up with Rene PaciValerie Leschber, MD In 1 week.   Specialty:  Internal Medicine   Contact information:   [address] [place] [address] [phone]        The results of significant diagnostics from this hospitalization (including imaging, microbiology, ancillary and laboratory) are listed below for reference.    Significant Diagnostic Studies: Dg Chest 2 View  05/17/2014   CLINICAL DATA:  Shortness of breath.  EXAM: CHEST  2 VIEW  COMPARISON:  Jan 06, 2014.  FINDINGS: Cardiomediastinal silhouette appears normal. Stable nodular and reticular densities are noted diffusely throughout both lungs with associated diffuse cystic change. No pneumothorax or significant pleural effusion is noted. Probable bronchiectasis is also present as noted on prior exams. There is again noted a few air-fluid levels within the cystic foci which were present on prior exam.  IMPRESSION: Stable diffuse cystic change seen throughout both lungs with associated  reticular nodular densities. There is no significant change compared to prior exam. These findings most likely represent sequela of previous severe or atypical infection.   Electronically Signed   By: Roque LiasJames  Green M.D.   On: 05/17/2014 20:03    Microbiology: Recent Results (from the past 240 hour(s))  CULTURE, BLOOD (ROUTINE X 2)     Status: None   Collection Time    05/18/14  1:10 AM      Result Value Ref Range Status   Specimen Description BLOOD LEFT ANTECUBITAL   Final   Special Requests BOTTLES DRAWN AEROBIC AND ANAEROBIC 10CC EA   Final   Culture  Setup Time     Final   Value: 05/18/2014 08:10     Performed at [facility]  Culture     Final   Value:        BLOOD CULTURE RECEIVED NO GROWTH TO DATE CULTURE WILL BE HELD FOR 5 DAYS BEFORE ISSUING A FINAL NEGATIVE REPORT     Performed at [facility]   Report Status PENDING   Incomplete  CULTURE, BLOOD (ROUTINE X 2)     Status: None   Collection Time    05/18/14  1:22 AM      Result Value Ref Range Status   Specimen Description BLOOD LEFT HAND   Final   Special Requests     Final   Value: BOTTLES DRAWN AEROBIC AND ANAEROBIC 10CC BLUE 5CC RED   Culture  Setup Time     Final   Value: 05/18/2014 08:10     Performed at [facility]   Culture     Final   Value:        BLOOD CULTURE RECEIVED NO GROWTH TO DATE CULTURE WILL BE HELD FOR 5 DAYS BEFORE ISSUING A FINAL NEGATIVE REPORT     Performed at [facility]   Report Status PENDING   Incomplete  CULTURE, EXPECTORATED SPUTUM-ASSESSMENT     Status: None   Collection Time    05/18/14  2:58 AM      Result Value Ref Range Status   Specimen Description SPUTUM   Final   Special Requests NONE   Final   Sputum evaluation     Final   Value: THIS SPECIMEN IS ACCEPTABLE. RESPIRATORY CULTURE REPORT TO FOLLOW.   Report Status 05/18/2014 FINAL   Final  CULTURE, RESPIRATORY (NON-EXPECTORATED)     Status: None   Collection Time    05/18/14  2:58 AM      Result  Value Ref Range Status   Specimen Description SPUTUM   Final   Special Requests ADDED [phone]   Final   Gram Stain     Final   Value: ABUNDANT WBC PRESENT,BOTH PMN AND MONONUCLEAR     RARE SQUAMOUS EPITHELIAL CELLS PRESENT     RARE GRAM POSITIVE COCCI     IN PAIRS RARE GRAM POSITIVE RODS     Performed at [facility]   Culture     Final   Value: NORMAL OROPHARYNGEAL FLORA     Performed at [facility]   Report Status 05/20/2014 FINAL   Final     Labs: Basic Metabolic Panel:  Recent Labs Lab 05/17/14 1936 05/18/14 0122 05/20/14 0459 05/21/14 0531  NA 138 137 139 139  K 4.4 4.2 4.4 4.5  CL 94* 94* 99 99  CO2 36* 32 35* 32  GLUCOSE 96 217* 141* 139*  BUN 14 16 26* 21  CREATININE 0.39* 0.36* 0.36* 0.33*  CALCIUM 9.0 8.8 8.5 8.3*   Liver Function Tests: No results found for this basename: AST, ALT, ALKPHOS, BILITOT, PROT, ALBUMIN,  in the last 168 hours No results found for this basename: LIPASE, AMYLASE,  in the last 168 hours No results found for this basename: AMMONIA,  in the last 168 hours CBC:  Recent Labs Lab 05/17/14 1936 05/20/14 0459 05/21/14 0531  WBC 7.5 9.8 7.3  NEUTROABS 5.6  --   --   HGB 13.4 12.0 11.9*  HCT 42.5 39.4 39.2  MCV 88.4 88.5 88.1  PLT 239 255 240   Cardiac Enzymes: No results found for this basename: CKTOTAL, CKMB, CKMBINDEX, TROPONINI,  in the last 168 hours BNP: BNP (last 3 results)  Recent Labs  01/06/14 1746 01/12/14 0525 05/17/14 1936  PROBNP 1290.0* 528.2* 832.1*   CBG:  Recent Labs Lab 05/22/14 0715 05/22/14 1152 05/22/14 1758 05/22/14 2112 05/23/14 0751  GLUCAP 123* 123* 100* 138* 96       Signed:  Christana Angelica N  [facility] 05/23/2014, 10:24 AM

## 2014-05-24 LAB — CULTURE, BLOOD (ROUTINE X 2)
Culture: NO GROWTH
Culture: NO GROWTH

## 2014-05-25 ENCOUNTER — Telehealth: Payer: Self-pay | Admitting: Internal Medicine

## 2014-05-25 ENCOUNTER — Telehealth: Payer: Self-pay | Admitting: *Deleted

## 2014-05-25 NOTE — Telephone Encounter (Signed)
Pt can be worked in on Thursday 06-04-14 at 11:15am slot for HFU; pt to be here at 11:00am to check in. Thanks.

## 2014-05-25 NOTE — Telephone Encounter (Signed)
Per 10/3 d/c note: F/u with pulmonology in 2 weeks   Please advise where pt can be worked in at? thanks

## 2014-05-25 NOTE — Telephone Encounter (Signed)
Transition Care Management Follow-up Telephone Call D/C from 05/23/14  How have you been since you were released from the hospital? Pt states she is ok could be better   Do you understand why you were in the hospital? YES, she stated she understood why she was admitted   Do you understand the discharge instrcutions? YES, she understood d/c summary  Items Reviewed:  Medications reviewed: YES, reviewed medications  Allergies reviewed: YES, reviewed   Dietary changes reviewed: YES, she said low sodium  Referrals reviewed: No referral needed   Functional Questionnaire:   Activities of Daily Living (ADLs):   She states they are independent in the following: bathing and hygiene, walking takes a little time due to SOB. Pt is on oxygen She states she doesn't require assistance    Any transportation issues/concerns?: NO   Any patient concerns? NO   Confirmed importance and date/time of follow-up visits scheduled: YES, appt was already made for 05/27/14 with Dr. Alwyn RenHopper advise pt to keep appt   Confirmed with patient if condition begins to worsen call PCP or go to the ER.  Patient was given the Call-a-Nurse line [phone]: YES

## 2014-05-25 NOTE — Telephone Encounter (Signed)
Katie- please see where we could put her on schedule

## 2014-05-25 NOTE — Telephone Encounter (Signed)
ATC PT NA. [facility]

## 2014-05-25 NOTE — Telephone Encounter (Signed)
Wanted to notify [name] that patient has put them off on coming out.  They have not been able to make a visit yet.

## 2014-05-26 NOTE — Telephone Encounter (Signed)
Noted, thanks!

## 2014-05-26 NOTE — Telephone Encounter (Signed)
Called and spoke to pt. Appt made for 06/04/14 at 11:15. Pt verbalized understanding and denied any further questions or concerns at this time.

## 2014-05-27 ENCOUNTER — Telehealth: Payer: Self-pay | Admitting: Internal Medicine

## 2014-05-27 ENCOUNTER — Ambulatory Visit (INDEPENDENT_AMBULATORY_CARE_PROVIDER_SITE_OTHER): Payer: Medicare Other | Admitting: Internal Medicine

## 2014-05-27 ENCOUNTER — Encounter: Payer: Self-pay | Admitting: Internal Medicine

## 2014-05-27 VITALS — BP 122/62 | HR 102 | Temp 98.0°F | Resp 15 | Wt 119.8 lb

## 2014-05-27 DIAGNOSIS — J9622 Acute and chronic respiratory failure with hypercapnia: Secondary | ICD-10-CM

## 2014-05-27 DIAGNOSIS — R7309 Other abnormal glucose: Secondary | ICD-10-CM

## 2014-05-27 DIAGNOSIS — R7303 Prediabetes: Secondary | ICD-10-CM

## 2014-05-27 DIAGNOSIS — R627 Adult failure to thrive: Secondary | ICD-10-CM

## 2014-05-27 DIAGNOSIS — Z23 Encounter for immunization: Secondary | ICD-10-CM

## 2014-05-27 NOTE — Telephone Encounter (Signed)
Patient would like clarification on prednisone instructions and mg.  She states she told Dr. Alwyn RenHopper She was taking 10mg  but patient is taking 20mg .

## 2014-05-27 NOTE — Progress Notes (Signed)
Pre visit review using our clinic review tool, if applicable. No additional management support is needed unless otherwise documented below in the visit note. 

## 2014-05-27 NOTE — Progress Notes (Signed)
   Subjective:    Patient ID: Stacy Novak, female    DOB: 11/01/1940, 73 y.o.   MRN: [id number]  HPI   Hospital records 9/27-10/3/15 were reviewed  She was hospitalized with acute on chronic respiratory failure. This is in the context of cystic bronchiectasis and pulmonary fibrosis.  In-hospital she required sliding scale insulin for elevated sugars in the context of steroid administration.  Her A1c was 6. 2% in September.  Home health nursing and physical therapy have been ordered but not started  She [facility] to start [facility] nutritional supplement.   She has not been using the Xopenex or generic Atrovent in her nebulizer.    Review of Systems  Since discharge she's noted progressive improvement.  She denies any fever or chills.  Cough is productive of clear sputum.  On 10/5 she did have very loose stool but this has resolved        Objective:   Physical Exam   Pertinent or positive findings include: She is frail and is wheelchair bound.  Breath sounds are decreased in all lung fields She has minimal rales. She also has severe paroxysmal cough. This did improve with opening of exam door. She felt overheated. She has 1+ pitting edema.Posterior tibial pulses are decreased. She does have some limb wasting.  General appearance :suboptimally nourished; in no distress. Eyes: No conjunctival inflammation or scleral icterus is present. Oral exam: Dental hygiene is good. Lips and gums are healthy appearing.There is no oropharyngeal erythema or exudate noted.  Heart:  Normal rate and regular rhythm,on recheck pulse 90.S1 and S2 normal without gallop, murmur, click, rub or other extra sounds   Abdomen: bowel sounds normal, soft and non-tender without masses, organomegaly or hernias noted.  No guarding or rebound.  Vascular : no bruits present. Skin:Warm & dry.  Intact without suspicious lesions or rashes ; no jaundice or tenting Lymphatic: No lymphadenopathy is noted about the  head, neck, axilla            Assessment & Plan:  #1 end-stage COPD  #2 suboptimal nutrition  #3 hyperglycemia secondary to steroid administration; A1c is prediabetic  Plan: See recommendations in after visit summary.

## 2014-05-27 NOTE — Patient Instructions (Signed)
   Until you're seen by Dr. Maple HudsonYoung; please use  Xopenex by nebulizer followed 15 minutes later by the Atrovent at 8 AM and 8 PM. Please assess your response to this regimen.  Continue to take the prednisone 10 mg daily after a meal.

## 2014-05-27 NOTE — Telephone Encounter (Signed)
[name]'s patient; post hospital F/U. Take Prednisone 20 mg qd after a meal until seen by Dr Fannie Kneelint Young

## 2014-05-28 NOTE — Telephone Encounter (Signed)
Pt informed of MD response.  

## 2014-06-03 NOTE — Telephone Encounter (Signed)
Danielle [facility] and wanted to know if Dr [name] wanted to give her an order for Phy therapy?  Best number to reach her is [phone]

## 2014-06-04 ENCOUNTER — Ambulatory Visit (INDEPENDENT_AMBULATORY_CARE_PROVIDER_SITE_OTHER): Payer: Medicare Other | Admitting: Internal Medicine

## 2014-06-04 ENCOUNTER — Encounter: Payer: Self-pay | Admitting: Internal Medicine

## 2014-06-04 VITALS — BP 115/58 | HR 121 | Ht 67.0 in | Wt 118.4 lb

## 2014-06-04 DIAGNOSIS — J479 Bronchiectasis, uncomplicated: Secondary | ICD-10-CM

## 2014-06-04 DIAGNOSIS — J9621 Acute and chronic respiratory failure with hypoxia: Secondary | ICD-10-CM

## 2014-06-04 DIAGNOSIS — E46 Unspecified protein-calorie malnutrition: Secondary | ICD-10-CM

## 2014-06-04 DIAGNOSIS — D510 Vitamin B12 deficiency anemia due to intrinsic factor deficiency: Secondary | ICD-10-CM

## 2014-06-04 DIAGNOSIS — E43 Unspecified severe protein-calorie malnutrition: Secondary | ICD-10-CM

## 2014-06-04 DIAGNOSIS — A310 Pulmonary mycobacterial infection: Secondary | ICD-10-CM

## 2014-06-04 NOTE — Patient Instructions (Addendum)
Order- [facility]- Patient already has home health nurse coming out tomorrow morning. Please ask her to check patient's random blood sugar tomorrow AM for dx Protein Calorie Malnutrition. Patient will have stopped prednisone 2 days before.   Ok to take lasix and potassium only as you find that you are retaining fluid- not always every other day.

## 2014-06-04 NOTE — Progress Notes (Signed)
Patient ID: Stacy Novak, female    DOB: 04/23/1941, 73 y.o.   MRN: [id number]  HPI 11/30/10-73 yo F never smoker, followed for bronchiectasis/ COPD, with hx treated atypical AFB/ MAIC, occasional hemoptysis and GERD.  Last here July 29, 2010 after normal flora on sputum cultures in October. Last antibiotic was augmentin in December. She questions if she should have had longer than 1 week treatment. Daily productive cough- yellow to light green. Shortness of breath with exertion across parking lot, up steps. . She does use Flutter occasionally. . No recent blood, fever, sweats or chest pain. Trial of Daliresp caused malaise.   03/01/11-73 yo F never smoker, followed for bronchiectasis/ COPD, with hx treated atypical AFB/ MAIC, occasional hemoptysis and GERD. No acute issues "just can't breathe and walk". She has avoided the weather extremes recently by staying in.  Cough always productive, greenish. Denies fever, sweat.  CXR- 11/30/10- reviewed with her- no definite new areas, but significant cystic bronchiectasis diffusely with some air fluid levels. PFT- 12/21/10- FEV1 1.02/ 49%; FEV1/FVC 0.54; no response to BD; TLC 0.77; DLCO 0.58    Severe obstructive disease, mild restriction.  6MWT She asks to defer formal oxygen assessment till [facility]. She has less cough in last few days, doesn't feel she could leave sputum specimen, but would like script for brand-name levaquin, which works better.   05/05/11- 73 yo F never smoker, followed for bronchiectasis/ COPD, with hx treated atypical AFB/ MAIC, occasional hemoptysis and GERD. We gave (she requested brand-name) levaquin to hold last visit. She felt she probably needed it because of pleuritic right chest pain that lasted 2-3 days, self limited. She isn't sure there was any change in her daily productive cough- usually white. Maybe some fever once. She waited because she had upset GI with a  little nausea and diarrhea. Still holding the levaquin.   07/06/11-  73 yo F never smoker, followed for bronchiectasis/ COPD, with hx treated atypical AFB/ MAIC, occasional hemoptysis and GERD. Has had flu vaccine. Has not taken the Levaquin prescription we gave previously. She was taking maintenance Zithromax to suppress bronchitis. She stayed off it for 2 weeks because of GI upset with cramps, after successfully taking it for 6 weeks. She has no GI discomfort now. She is going to try resuming maintenance Zithromax, but adding a probiotic. She did not tolerate Daliresp.  11/07/11-  73 yo F never smoker, followed for bronchiectasis/ COPD, with hx treated atypical AFB/ MAIC, occasional hemoptysis and GERD. Still coughing a lot, often productive, with looser mucus. Had tried maintenance Zithromax until she got GI upset. Remains on chronic Nexium for GERD. Discussed her recent reports of cardiac conduction problems with macrolide antibiotics. (REC- next visit- look at HR, chemistry)  01/19/12- 73 yo F never smoker, followed for bronchiectasis/ COPD, with hx treated atypical AFB/ MAIC, occasional hemoptysis and GERD. Coughing up blood-bright red; would happen off and on, but only on May 30 with no repeat today , . SOB and wheezing has increased. Always has some cough which has not changed. No easy bruising or other bleeding. No fever. Previous hemoptysis 2 or 3 years ago at which time we stopped the baby aspirin.  03/19/12-  73 yo F never smoker, followed for bronchiectasis/ COPD, with hx treated atypical AFB/ MAIC, occasional hemoptysis and GERD. Having increased SOB past 3 weeks; losing weight and unsure why; no energy.. Has lost 10 lbs since last visit.  140>130lbs. COPD assessment test (CAT) score 32/40. She complains of weakness, aching joints, weight loss. 2 episodes where she passed mucus per rectum-not blood. Dry eyes, nose and mouth times one month. Bloated with eating-takes her breath. Burning  she-asks B12 shot. Jittery. Has slept up in a chair for years because of chronic midthoracic back pain has not gone back to see Dr. [name] in a long time. She asks if all of this is a side effect of Nexium. We reviewed her radiology again. CXR 01/23/12-  IMPRESSION:  Stable chronic changes with scattered air-fluid levels, as  described above.  Original Report Authenticated By: [name], M.D.   04/02/12- 33 yo F never smoker, followed for  Cystic bronchiectasis/ COPD, with hx treated atypical AFB/ MAIC, occasional hemoptysis and GERD. [facility]03[facility]27[facility]12 for dx bronchiectasis w/ acute exacerbation, COPD, anemia/ B12 deficiency, Failure to thrive.Treated vanc/ zithromax/ ceftazidime. Cultures all no growth. Severe diffuse cystic bronchiectasis. Had ID consult. ECHO 8/2- normal EF60%, unable to visualize for PA pressures. Treated B12 deficiency x 5 days, then outpat injection.  She has not re-established with [name]/ PCP and says there is not a current record there. Air conditioning broken, so she has been staying at [facility] place. Eating better, not gaining weight. Weak. Thick white mucus in mouth. No fever, chills, blood, pain, nodes.   04/17/12- [name]/ INF.DIS. DISEASE, PULMONARY D/T MYCOBACTERIA - [name], MD 04/17/2012 12:24 PM Signed  She is not doing well based on her weight loss and probable recent exacerbation. I reviewed her CXR with her, there is not a huge difference vs her previous studies from 11-2010. She is offered to restart her MAI therapy but she wants "to get her stomach straightened out". She has quit taking her prilosec/nexium and is relying on tums now.will get a repeat sputum AFB on her (she is given a specimen container and will return after she is able to provide a good specimen).  Needs improved nutrition and probably home O2 (i encouraged her multiple times to call [name] about this). We may need GI help with her stomach issues.  Will see her  back on October 3.   04/30/12-  52 yo F never smoker, followed for severe Cystic bronchiectasis/ COPD, with hx treated atypical AFB/ MAIC, occasional hemoptysis and GERD. Unable to tell any difference with using O2 QHS-does not use O2 during the day. Increased SOB-not getting any better; Unable to drink much water. Minor epistaxis blamed on dryness from her oxygen. Son thinks/blood-tinged sputum occasionally, otherwise yellowish or greenish. She had stopped Nexium, blaming it for her poor appetite-it seemed to cause bloating. Instead she now uses TUMS and we discussed his meds. Complains of burning in her feet attributed by her to B12 deficiency. I again asked her to get back with her primary physician.  05/14/12- 41 yo F never smoker, followed for severe Cystic bronchiectasis/ COPD, with hx treated atypical AFB/ MAIC, occasional hemoptysis and GERD  Husband here Sputum cx 03/22/12- Nl flora. ACUTE VISIT: increased cough-yellow in color and Increased SOB-getting worse. Cough is chronically productive. In the last few days has felt more short of breath, nonspecific. Had flu vaccine. Using saline nasal spray for nasal dryness on home oxygen at 2 L. She is taking a liquid antacid instead of Nexium and admits reduced by mouth intake. Claims foods makes her bloated CXR 03/22/12- I reviewed the images with patient and husband IMPRESSION:  No significant change in the severe chronic changes as above.  Original Report Authenticated By: Cyndie ChimeKEVIN G. DOVER, M.D.   07/30/12- 73 yo F never smoker, followed for severe Cystic bronchiectasis/ COPD, with hx treated atypical AFB/ MAIC, occasional hemoptysis and GERD   FOLLOWS FOR: not able to eat or drink enough; sores in nose; not wearing O2 every night due to bumps in nose, feels weak and like she needs fluids. Has lost 20 lbs over past year. Always feels hot at first to keep her home about 564 which is too cold for family. [facility]  [place]. Always productive cough without change. Exertion makes her cough more productive. She will see Dr. Hatcher/Infectious Disease again in January. Continues oxygen 2 L per minute at night and as needed in the day. Never took last prescription for Augmentin in September because she lost the prescription. Wants to continue B12 shots. No numbness in her feet.  09/11/12- 89 yo F never smoker, followed for severe Cystic bronchiectasis/ COPD, with hx treated atypical AFB/ MAIC, occasional hemoptysis and GERD   ACUTE: 86% on RA on arrival-- Dr. Ninetta LightsHatcher/ ID saw her today and thought she should be seen, heard "rubbing" in her lungs today--diff breathing worsening over last "couple" weeks w even small activities-- dry cough, denies any wheezing or chest tightness.  Exercise tolerance always poor. No distinct event but may be more DOE in last 2 weeks. No change in chronic cough, sometimes productive of yellow green, occasional small streak of blood. No fever, nodes, palpitation or chest pain. Feet swell some, dependent. Very weak. Dyspnea and weakness caused by her lung disease interfere with routine activities- dressing, grooming, ambulating through home. She needs to be able to sit frequently, and needs assistance for sustained ambulation,. She should have a light wheel chair. I don't think she is strong enough to manage a regular weight chair by herself. .  Weight stable 121-123 since October. She didn't take augmentin when given because she says she wasn't taking enough fluids for it- discussed. Has O2 concentrator for sleep but tends to leave it off- prongs bother nose.   10/29/12- 73 yo F never smoker, followed for severe Cystic bronchiectasis/ COPD, with hx treated atypical AFB/ MAIC, occasional hemoptysis and GERD FOLLOWS FOR: Pt states she has not recently been wearing her O2 at night as she feels it makes her breathing worse during the day; States she has noticed increased SOB as well. 85% RA on  arrival-took approximately 2 minutes to reach 90% RA. She finally took the antibiotic we had given her. She stays short of breath but not wearing oxygen. Her portable tank is too heavy for her. We discussed alternatives including small concentrators. She has generally sought to minimize intervention and treatment. 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  Numerous air-fluid levels appear progressive and could be due to  infection.  Original Report Authenticated By: Rudie MeyerP. Gallerani, M.D.  11/29/12- 73 yo F never smoker, followed for severe Cystic bronchiectasis/ COPD, with hx treated atypical AFB/ MAIC, occasional hemoptysis and GERD    Husband is here. FOLLOWS FOR:Pt feels she gets worse when on O2( 2L/ Advanced); unsure of how her breathing is really doing We discussed her oxygen. She says she is "afraid" of oxygen tanks and uncomfortable working with them. We discussed portable concentrators. Her husband is aware that she frequently is not compliant with therapies.  12/11/12- 73 yo F never smoker, followed for severe Cystic bronchiectasis/ COPD, with hx treated atypical AFB/ MAIC,  occasional hemoptysis and GERD    Husband is here. Acute work-in today for increased cough and shortness of breath. She wanted a nebulizer treatment and Depo-Medrol. We discussed her B12 shots and I asked her to followup with her primary physician for long-term decision about this. She has a peripheral neuropathy but the biggest problems have been cachexia and some difficulty with medication compliance. She denies fever, chills, swollen glands. She has a followup appointment with infectious disease.  02/28/13-72 yo F never smoker, followed for severe Cystic bronchiectasis/ COPD, with hx treated atypical AFB/ MAIC, occasional hemoptysis and GERD   She is willing to use portable oxygen now that she has a Designer, jewelleryportable concentrator. She never felt safe with compressed oxygen tanks. Otherwise  little has changed. She remains very weak with persistent cough which is sometimes productive of yellow or green sputum. No blood, fever or adenopathy. She desaturates easily with exertion but is comfortable at rest on room air. CXR 12/17/12 IMPRESSION:  Severe chronic cystic bronchiectasis with superimposed infection,  slightly progressed in the left midzone.  Original Report Authenticated By: Francene BoyersJames Maxwell, M.D.   Review of Systems-See HPI Constitutional:   + weight loss, No-night sweats, No- Fevers, chills, +fatigue, lassitude. HEENT:   No headaches,  Difficulty swallowing,  Tooth/dental problems,  Sore throat,                No sneezing, itching, ear ache, nasal congestion, post nasal drip,  CV:  No chest pain, orthopnea, PND, swelling in lower extremities, anasarca, +dizziness, No-palpitations GI  No heartburn, indigestion, abdominal pain, nausea, vomiting, Resp: +Shortness of breath with exertion. , no recent coughing up of blood. .  Some minimal wheezing.  Skin: no rash or lesions. GU:   MS:  + joint pain or swelling.   Psych:  No change in mood or affect. No depression + anxiety.  No memory loss.  Objective:   Physical Exam General- Alert, Oriented, Affect-appropriate, Distress- none acute; thin. Looks chronically ill, wheelchair, O2 2L / concentrator 93$ Skin- rash-none, lesions- none, excoriation- none. +Dusky hands and feet Lymphadenopathy- none Head- atraumatic            Eyes- Gross vision intact, PERRLA, conjunctivae clear secretions            Ears- Hearing, canals- normal            Nose- Clear, No-Septal dev, mucus, polyps, erosion, perforation             Throat- Mallampati II , mucosa clear , drainage- none, tonsils- atrophic Neck- flexible , trachea midline, no stridor , thyroid nl, carotid no bruit Chest - symmetrical excursion , unlabored           Heart/CV- + remains regular,rapid RRR ., no murmur , no gallop  , no rub, nl s1 s2                            JVD+  full , edema +1, stasis changes- none, varices- none           Lung-  + bilateral  Rhonchi- clearer than last visit, unlabored, cough- mild , dullness-none, rub- none           Chest wall-  Abd-  Br/ Gen/ Rectal- Not done, not indicated Extrem- cyanosis- none, clubbing, none, atrophy- none, strength- nl Neuro- grossly intact to observation             Patient ID: Carney BernJean  Alphonzo Severance, female    DOB: [birth year], 73 y.o.   MRN: [id number]  HPI 11/30/10-73 yo F never smoker, followed for bronchiectasis/ COPD, with hx treated atypical AFB/ MAIC, occasional hemoptysis and GERD.  Last here July 29, 2010 after normal flora on sputum cultures in October. Last antibiotic was augmentin in December. She questions if she should have had longer than 1 week treatment. Daily productive cough- yellow to light green. Shortness of breath with exertion across parking lot, up steps. . She does use Flutter occasionally. . No recent blood, fever, sweats or chest pain. Trial of Daliresp caused malaise.   03/01/11-73 yo F never smoker, followed for bronchiectasis/ COPD, with hx treated atypical AFB/ MAIC, occasional hemoptysis and GERD. No acute issues "just can't breathe and walk". She has avoided the weather extremes recently by staying in.  Cough always productive, greenish. Denies fever, sweat.  CXR- 11/30/10- reviewed with her- no definite new areas, but significant cystic bronchiectasis diffusely with some air fluid levels. PFT- 12/21/10- FEV1 1.02/ 49%; FEV1/FVC 0.54; no response to BD; TLC 0.77; DLCO 0.58    Severe obstructive disease, mild restriction.  She asks to defer formal oxygen assessment till she finishes spending summer at [facility] with her grandchildren. She has less cough in last few days, doesn't feel she could leave sputum specimen, but would like script for brand-name levaquin, which works better.   05/05/11- 52 yo F never smoker, followed for bronchiectasis/ COPD, with hx treated atypical  AFB/ MAIC, occasional hemoptysis and GERD. We gave (she requested brand-name) levaquin to hold last visit. She felt she probably needed it because of pleuritic right chest pain that lasted 2-3 days, self limited. She isn't sure there was any change in her daily productive cough- usually white. Maybe some fever once. She waited because she had upset GI with a little nausea and diarrhea. Still holding the levaquin.   07/06/11-  68 yo F never smoker, followed for bronchiectasis/ COPD, with hx treated atypical AFB/ MAIC, occasional hemoptysis and GERD. Has had flu vaccine. Has not taken the Levaquin prescription we gave previously. She was taking maintenance Zithromax to suppress bronchitis. She stayed off it for 2 weeks because of GI upset with cramps, after successfully taking it for 6 weeks. She has no GI discomfort now. She is going to try resuming maintenance Zithromax, but adding a probiotic. She did not tolerate Daliresp.  11/07/11-  9 yo F never smoker, followed for bronchiectasis/ COPD, with hx treated atypical AFB/ MAIC, occasional hemoptysis and GERD. Still coughing a lot, often productive, with looser mucus. Had tried maintenance Zithromax until she got GI upset. Remains on chronic Nexium for GERD. Discussed her recent reports of cardiac conduction problems with macrolide antibiotics. (REC- next visit- look at HR, chemistry)  01/19/12- 21 yo F never smoker, followed for bronchiectasis/ COPD, with hx treated atypical AFB/ MAIC, occasional hemoptysis and GERD. Coughing up blood-bright red; would happen off and on, but only on May 30 with no repeat today , . SOB and wheezing has increased. Always has some cough which has not changed. No easy bruising or other bleeding. No fever. Previous hemoptysis 2 or 3 years ago at which time we stopped the baby aspirin.  03/19/12-  72 yo F never smoker, followed for bronchiectasis/ COPD, with hx treated atypical AFB/ MAIC, occasional hemoptysis and  GERD. Having increased SOB past 3 weeks; losing weight and unsure why; no energy.. Has lost 10 lbs since last visit. 140>130lbs. COPD assessment test (  CAT) score 32/40. She complains of weakness, aching joints, weight loss. 2 episodes where she passed mucus per rectum-not blood. Dry eyes, nose and mouth times one month. Bloated with eating-takes her breath. Burning she-asks B12 shot. Jittery. Has slept up in a chair for years because of chronic midthoracic back pain has not gone back to see Dr. Ricki MillerPang in a long time. She asks if all of this is a side effect of Nexium. We reviewed her radiology again. CXR 01/23/12-  IMPRESSION:  Stable chronic changes with scattered air-fluid levels, as  described above.  Original Report Authenticated By: Charline BillsSRIYESH KRISHNAN, M.D.   04/02/12- 73 yo F never smoker, followed for  Cystic bronchiectasis/ COPD, with hx treated atypical AFB/ MAIC, occasional hemoptysis and GERD. Post Hospital-8[facility]1-03/27[facility]12 for dx bronchiectasis w/ acute exacerbation, COPD, anemia/ B12 deficiency, Failure to thrive.Treated vanc/ zithromax/ ceftazidime. Cultures all no growth. Severe diffuse cystic bronchiectasis. Had ID consult. ECHO 8/2- normal EF60%, unable to visualize for PA pressures. Treated B12 deficiency x 5 days, then outpat injection.  She has not re-established with [name]/ PCP and says there is not a current record there. Air conditioning broken, so [facility]. Eating better, not gaining weight. Weak. Thick white mucus in mouth. No fever, chills, blood, pain, nodes.   04/17/12- [name]/ INF.DIS. DISEASE, PULMONARY D/T MYCOBACTERIA - Johny SaxJeffrey Hatcher, MD 04/17/2012 12:24 PM Signed  She is not doing well based on her weight loss and probable recent exacerbation. I reviewed her CXR with her, there is not a huge difference vs her previous studies from 11-2010. She is offered to restart her MAI therapy but she wants "to get her stomach straightened out".  She has quit taking her prilosec/nexium and is relying on tums now.will get a repeat sputum AFB on her (she is given a specimen container and will return after she is able to provide a good specimen).  Needs improved nutrition and probably home O2 (i encouraged her multiple times to call Dr Maple HudsonYoung about this). We may need GI help with her stomach issues.  Will see her back on October 3.   04/30/12-  73 yo F never smoker, followed for severe Cystic bronchiectasis/ COPD, with hx treated atypical AFB/ MAIC, occasional hemoptysis and GERD. Unable to tell any difference with using O2 QHS-does not use O2 during the day. Increased SOB-not getting any better; Unable to drink much water. Minor epistaxis blamed on dryness from her oxygen. Son thinks/blood-tinged sputum occasionally, otherwise yellowish or greenish. She had stopped Nexium, blaming it for her poor appetite-it seemed to cause bloating. Instead she now uses TUMS and we discussed his meds. Complains of burning in her feet attributed by her to B12 deficiency. I again asked her to get back with her primary physician.  05/14/12- 73 yo F never smoker, followed for severe Cystic bronchiectasis/ COPD, with hx treated atypical AFB/ MAIC, occasional hemoptysis and GERD  Husband here Sputum cx 03/22/12- Nl flora. ACUTE VISIT: increased cough-yellow in color and Increased SOB-getting worse. Cough is chronically productive. In the last few days has felt more short of breath, nonspecific. Had flu vaccine. Using saline nasal spray for nasal dryness on home oxygen at 2 L. She is taking a liquid antacid instead of Nexium and admits reduced by mouth intake. Claims foods makes her bloated CXR 03/22/12- I reviewed the images with patient and husband IMPRESSION:  No significant change in the severe chronic changes as above.  Original Report Authenticated By:  Cyndie Chime, M.D.   07/30/12- 57 yo F never smoker, followed for severe Cystic bronchiectasis/ COPD, with hx  treated atypical AFB/ MAIC, occasional hemoptysis and GERD   FOLLOWS FOR: not able to eat or drink enough; sores in nose; not wearing O2 every night due to bumps in nose, feels weak and like she needs fluids. Has lost 20 lbs over past year. Always feels hot at first to keep her home about 33 which is too cold for family. Still spending most of her time down at [facility]. Always productive cough without change. Exertion makes her cough more productive. She will see Dr. Hatcher/Infectious Disease again in January. Continues oxygen 2 L per minute at night and as needed in the day. Never took last prescription for Augmentin in September because she lost the prescription. Wants to continue B12 shots. No numbness in her feet.  09/11/12- 85 yo F never smoker, followed for severe Cystic bronchiectasis/ COPD, with hx treated atypical AFB/ MAIC, occasional hemoptysis and GERD   ACUTE: 86% on RA on arrival-- Dr. Ninetta Lights ID saw her today and thought she should be seen, heard "rubbing" in her lungs today--diff breathing worsening over last "couple" weeks w even small activities-- dry cough, denies any wheezing or chest tightness.  Exercise tolerance always poor. No distinct event but may be more DOE in last 2 weeks. No change in chronic cough, sometimes productive of yellow green, occasional small streak of blood. No fever, nodes, palpitation or chest pain. Feet swell some, dependent. Very weak. Dyspnea and weakness caused by her lung disease interfere with routine activities- dressing, grooming, ambulating through home. She needs to be able to sit frequently, and needs assistance for sustained ambulation,. She should have a light wheel chair. I don't think she is strong enough to manage a regular weight chair by herself. .  Weight stable 121-123 since October. She didn't take augmentin when given because she says she wasn't taking enough fluids for it- discussed. Has O2 concentrator for sleep but tends to leave it  off- prongs bother nose.   10/29/12- 86 yo F never smoker, followed for severe Cystic bronchiectasis/ COPD, with hx treated atypical AFB/ MAIC, occasional hemoptysis and GERD FOLLOWS FOR: Pt states she has not recently been wearing her O2 at night as she feels it makes her breathing worse during the day; States she has noticed increased SOB as well. 85% RA on arrival-took approximately 2 minutes to reach 90% RA. She finally took the antibiotic we had given her. She stays short of breath but not wearing oxygen. Her portable tank is too heavy for her. We discussed alternatives including small concentrators. She has generally sought to minimize intervention and treatment. 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  Numerous air-fluid levels appear progressive and could be due to  infection.  Original Report Authenticated By: Rudie Meyer, M.D.  11/29/12- 36 yo F never smoker, followed for severe Cystic bronchiectasis/ COPD, with hx treated atypical AFB/ MAIC, occasional hemoptysis and GERD    Husband is here. FOLLOWS FOR:Pt feels she gets worse when on O2( 2L/ [facility]); unsure of how her breathing is really doing We discussed her oxygen. She says she is "afraid" of oxygen tanks and uncomfortable working with them. We discussed portable concentrators. Her husband is aware that she frequently is not compliant with therapies.  12/11/12- 15 yo F never smoker, followed for severe Cystic bronchiectasis/ COPD, with hx treated atypical AFB/ MAIC, occasional hemoptysis and GERD  Husband is here. Acute work-in today for increased cough and shortness of breath. She wanted a nebulizer treatment and Depo-Medrol. We discussed her B12 shots and I asked her to followup with her primary physician for long-term decision about this. She has a peripheral neuropathy but the biggest problems have been cachexia and some difficulty with medication compliance. She denies fever, chills,  swollen glands. She has a followup appointment with infectious disease.  02/28/13-72 yo F never smoker, followed for severe Cystic bronchiectasis/ COPD, with hx treated atypical AFB/ MAIC, occasional hemoptysis and GERD   She is willing to use portable oxygen now that she has a Designer, jewelleryportable concentrator. She never felt safe with compressed oxygen tanks. Otherwise little has changed. She remains very weak with persistent cough which is sometimes productive of yellow or green sputum. No blood, fever or adenopathy. She desaturates easily with exertion but is comfortable at rest on room air. CXR 12/17/12 IMPRESSION:  Severe chronic cystic bronchiectasis with superimposed infection,  slightly progressed in the left midzone.  Original Report Authenticated By: Francene BoyersJames Maxwell, M.D.  06/25/13- 73 yo F never smoker, followed for severe Cystic bronchiectasis/ COPD, with hx treated atypical AFB/ MAIC, occasional hemoptysis and GERD    Husband heer FOLLOWS FOR: Pt states her breathing has gotten worse; was seen by Dr Ninetta LightsHatcher yesterday and her O2 levels were dropping. Told to follow up today. She says she desaturated while coughing at Dr Moshe CiproHatcher's. More SOB/ DOE 3 weeks. Chronic cough not changed- brownish, no blood, fever or chills. She tries using Flutter but it is not helping enough. We discussed pneumatic VEST. O2 2-3L/Min Imogen portable concentrator.   11/18/13- 73 yo F never smoker, followed for severe Cystic bronchiectasis/ COPD, with hx treated atypical AFB/ MAIC, occasional hemoptysis and GERD    Husband here  11/18/13- 73 yo F never smoker, followed for severe Cystic bronchiectasis/ COPD, with hx treated atypical AFB/ MAIC, occasional hemoptysis and GERD    Husband here FOLLOWS FOR: Pt c/o an increase in SOB x 2 weeks. Pt has productive cough with brown mucous x years. Pt was 84% when waiting in lobby, pt stated it was d/t coughing, with rest pt O2 increase to 92% Recent viral system acute infection started  about 2 weeks ago with increased cough and shortness of breath. Sputum white or dark with scant blood streak. Doesn't know if she had fever. Mild GI upset. Continues oxygen O2 2-3L/Min Inogen portable concentrator  01/02/14- 73 yo F never smoker, followed for severe Cystic bronchiectasis/ COPD, with hx treated atypical AFB/ MAIC, occasional hemoptysis and GERD    Husband here ACUTE VISIT: increased SOB and wheezing x 1 week at least. Cough-productive-yellow to brown in color. Stays hot as well. Pt has feelings of passing out at times. O2 2-3L Inogen portable concentrator  01/21/2014 Post Hospital follow up  Patient presents for a post hospital followup. Patient was admitted, may 19 2 may 28th for an acute exacerbation of bronchiectasis with acute on chronic respiratory failure She was treated with aggressive IV antibiotics, nebulized bronchodilators.  She did have a mild decompensation of diastolic heart failure requiring gentle diuresis.  Reports some good days and bad since discharge.   Does report some occasional dyspnea, wheezing, tightness, cough. Encouraged on VEST compliance. (not using)  On 2-3 l/m Oxygen .  Was started on Atrovent and Xopenex neb at discharge.   03/16/14- 73 yo F never smoker, followed for severe Cystic bronchiectasis/ COPD, with hx treated atypical AFB/ MAIC, occasional hemoptysis  and GERD    Husband here FOLLOWS FOR:  Still having sob, cough with mucus clear.  Reports have great difficulty catching her breath   CXR 01/02/14 IMPRESSION:  Again noted patchy opacification of the lung parenchyma bilaterally  with bronchiectasis and multiple cystic changes with air-fluid  levels without significant change from prior exam. No definite  superimposed confluent segmental infiltrate. No pulmonary edema.  Electronically Signed  By: Natasha Mead M.D.  On: 01/02/2014 16:21  06/04/14- 26 yo F never smoker, followed for severe Cystic bronchiectasis/ COPD, with hx treated atypical  AFB/ MAIC, occasional hemoptysis and GERD    Husband here FOLLOW FOR:  hospital discharge, was not able to eat, had bronchial infection, sob Last ov we gave depo 80 Has been in hosp w blood sugar management, followed up by ID and her primary physician. Sputum cx Sept grew Nl Flora. AFB still pending. She mostly wanted to talk about her diet. Finish prednisone and Zithromax from hospital discharge. CXR 05/17/14 IMPRESSION:  Stable diffuse cystic change seen throughout both lungs with  associated reticular nodular densities. There is no significant  change compared to prior exam. These findings most likely represent  sequela of previous severe or atypical infection.  Electronically Signed  By: Roque Lias M.D.  On: 05/17/2014 20:03   Review of Systems-See HPI Constitutional:   No-weight loss, No-night sweats, No- Fevers, chills, +fatigue, lassitude. HEENT:   No headaches,  Difficulty swallowing,  Tooth/dental problems,  Sore throat,                No sneezing, itching, ear ache, nasal congestion, post nasal drip,  CV:  No chest pain, orthopnea, PND, swelling in lower extremities, anasarca,  No-palpitations GI  No heartburn, indigestion, abdominal pain, nausea, vomiting, Resp: +Shortness of breath with exertion. cough Skin: no rash or lesions. GU:   MS:  + joint pain or swelling , wheelchair Psych:  No change in mood or affect. No depression + anxiety.  No memory loss.  Objective:   Physical Exam General- Alert, Oriented, Affect-appropriate, Distress- none acute; thin. Looks chronically ill,              +wheelchair, O2 2L  Skin- rash-none, lesions- none, excoriation- none.  Lymphadenopathy- none Head- atraumatic            Eyes- Gross vision intact, PERRLA, conjunctivae clear secretions            Ears- Hearing, canals- normal            Nose- Clear, No-Septal dev, mucus, polyps, erosion, perforation             Throat- Mallampati II , mucosa clear , drainage- none, tonsils-  atrophic Neck- flexible , trachea midline, no stridor , thyroid nl, carotid no bruit Chest - symmetrical excursion , unlabored           Heart/CV- + remains regular,rapid RRR ., no murmur , no gallop  , no rub, nl s1 s2                            JVD+1 CM , edema-none, stasis changes- none, varices- none           Lung-  + bilateral  rhonchi, unlabored, cough- mild , dullness-none, rub- none           Chest wall-  Abd-  Br/ Gen/ Rectal- Not done, not indicated Extrem- cyanosis- none, clubbing, none, atrophy- none, strength-  nl Neuro- grossly intact to observation

## 2014-06-04 NOTE — Assessment & Plan Note (Signed)
Encouraged to supplement diet

## 2014-06-04 NOTE — Telephone Encounter (Signed)
Verbal ok?

## 2014-06-04 NOTE — Assessment & Plan Note (Signed)
Cultures remain negative

## 2014-06-04 NOTE — Assessment & Plan Note (Signed)
She remains oxygen dependent, limited as much by muscle weakness as pulmonary limitations

## 2014-06-04 NOTE — Assessment & Plan Note (Signed)
She thinks she feels more numbness in her feet when she is due for her B12 shot. We discussed sublingual therapy

## 2014-06-04 NOTE — Assessment & Plan Note (Signed)
Severe scarring from previous infection but clinically stable Bigger problem is related now to chronic debilitation

## 2014-06-05 ENCOUNTER — Telehealth: Payer: Self-pay | Admitting: Internal Medicine

## 2014-06-05 NOTE — Assessment & Plan Note (Signed)
Plan-continue diet supplement. Note that she continues vitamin B12

## 2014-06-05 NOTE — Telephone Encounter (Signed)
Her blood sugar is managed by her primary physician, so [facility] would need to get any covering orders from them.

## 2014-06-05 NOTE — Telephone Encounter (Signed)
I spoke with [facility]le with [facility].  Informed her of below.  Duwayne HeckDanielle states the order came from pulmonology yesterday and will need to know if this is still needed.  Pt was seen by CY on 06/04/14 with the following instructions:  Patient Instructions     Order- DME [facility]- Patient already has home health nurse coming out tomorrow morning. Please ask her to check patient's random blood sugar tomorrow AM for dx Protein Calorie Malnutrition. Patient will have stopped prednisone 2 days before.  Ok to take lasix and potassium only as you find that you are retaining fluid- not always every other day    Dr. Maple HudsonYoung, pls advise.  Thank you.

## 2014-06-05 NOTE — Telephone Encounter (Signed)
Called Danielle back no answer LMOM with md response...Raechel Chute/lmb

## 2014-06-05 NOTE — Telephone Encounter (Signed)
Attention Katie

## 2014-06-05 NOTE — Assessment & Plan Note (Signed)
Continues vitamin B12

## 2014-06-05 NOTE — Telephone Encounter (Signed)
[facility] and stated that they do not have a machine that they can use to check her BS.  They can do labs.  She stated that she will be back at the pts house on either 10-19 or 10-21.   Would you like them to draw labs at that time?  Please advise. Thanks  Allergies  Allergen Reactions  . Daliresp [Roflumilast] Other (See Comments)    Mental changes, feeling weird  . Doxycycline Other (See Comments)    Bad taste in mouth, "salty coating"  . Levaquin [Levofloxacin] Palpitations and Other (See Comments)    Joint pains/aches  . Other Other (See Comments)    DAIRY PRODUCTS-causes thick mucous secretions  . Sulfonamide Derivatives Nausea Only    Current Outpatient Prescriptions on File Prior to Visit  Medication Sig Dispense Refill  . azithromycin (ZITHROMAX Z-PAK) 250 MG tablet Take 1 tablet (250 mg total) by mouth daily. 2 po on day 1 then 1 tab po on days 2-5  14 each  0  . calcium carbonate (TUMS - DOSED IN MG ELEMENTAL CALCIUM) 500 MG chewable tablet Chew 1 tablet by mouth daily as needed for indigestion or heartburn.      . cyanocobalamin (,VITAMIN B-12,) 1000 MCG/ML injection Inject 1 mL (1,000 mcg total) into the muscle every 30 (thirty) days.  1 mL  0  . diltiazem (CARDIZEM CD) 120 MG 24 hr capsule Take 1 capsule (120 mg total) by mouth daily.  90 capsule  3  . furosemide (LASIX) 20 MG tablet Take 1 tablet (20 mg total) by mouth every other day.  30 tablet  0  . ipratropium (ATROVENT) 0.02 % nebulizer solution Take 2.5 mLs (0.5 mg total) by nebulization 3 (three) times daily.  75 mL  12  . levalbuterol (XOPENEX) 0.63 MG/3ML nebulizer solution Take 3 mLs (0.63 mg total) by nebulization every 3 (three) hours as needed for wheezing or shortness of breath.  3 mL  12  . Polyethyl Glycol-Propyl Glycol (SYSTANE) 0.4-0.3 % SOLN Place 1 drop into both eyes daily as needed (for dry eyes).       . potassium chloride (K-DUR) 10 MEQ tablet Take 1 tablet (10 mEq total) by mouth  every other day.  30 tablet  0   No current facility-administered medications on file prior to visit.

## 2014-06-05 NOTE — Assessment & Plan Note (Signed)
PFT 12/21/10- severe obstruction. FEV1 1.02/49%; FEV1/FVC 0.54; no response to bronchodilator; TLC 77%; DLCO 0.59. 6 MWT 12/21/10- 91%, 91%, stopped at 3 minutes, 82%  240 meters. O2 2 L/ [facility] Chronic hypoxic respiratory failure Primary problem now is debilitation with chronic hypoxic respiratory failure. There does not seem to be active pulmonary infection, just a lot of scarring and oxygen dependence. Plan-try Depo-Medrol for effect

## 2014-06-05 NOTE — Assessment & Plan Note (Signed)
Still no documented recurrence

## 2014-06-08 NOTE — Telephone Encounter (Signed)
LMTCB-ask to speak with Florentina AddisonKatie only!. Thanks.

## 2014-06-11 ENCOUNTER — Ambulatory Visit (INDEPENDENT_AMBULATORY_CARE_PROVIDER_SITE_OTHER): Payer: Medicare Other

## 2014-06-11 DIAGNOSIS — E538 Deficiency of other specified B group vitamins: Secondary | ICD-10-CM

## 2014-06-11 MED ORDER — CYANOCOBALAMIN 1000 MCG/ML IJ SOLN
1000.0000 ug | Freq: Once | INTRAMUSCULAR | Status: AC
  Administered 2014-06-11: 1000 ug via INTRAMUSCULAR

## 2014-06-12 ENCOUNTER — Telehealth: Payer: Self-pay

## 2014-06-12 NOTE — Telephone Encounter (Signed)
Home heatlh nurse called.   Stated that pt is having sx of SOB and sore back. Abx course was just completed that was rxed by a different MD for bacterial bronchitis.  Instructed to have pt follow up with that MD in case there is a complication with that last dx.  Requested pt to call back if there is any pain or odor when urinating.

## 2014-06-15 ENCOUNTER — Telehealth: Payer: Self-pay | Admitting: Internal Medicine

## 2014-06-15 NOTE — Telephone Encounter (Signed)
Spoke with pt, states that she is scheduled for tomorrow at 1:30 but also scheduled a rov for 12/17 at her last visit with CY on 10/15.  Pt was wondering if she needed to keep this appt.  I advised pt that since he had requested a 2 month rov it would be ok to cancel tomorrow's appt unless she was having any concerns she wished CY to examine.  The following was documented in a telephone note from 10/23: Stacy Novak, CMA at 06/12/2014 9:23 AM     Status: Signed        Home heatlh nurse called.  Stated that pt is having sx of SOB and sore back. Abx course was just completed that was rxed by a different MD for bacterial bronchitis.  Instructed to have pt follow up with that MD in case there is a complication with that last dx.   -------------------------------------------------------------------------------------------------------------------------------------------  Asked pt about this pain, states it's intermittent but still bothering her.  Pt decided to keep appt for tomorrow to discuss this back pain/sob.  Nothing further needed at this time.

## 2014-06-16 ENCOUNTER — Ambulatory Visit (INDEPENDENT_AMBULATORY_CARE_PROVIDER_SITE_OTHER): Payer: Medicare Other | Admitting: Internal Medicine

## 2014-06-16 ENCOUNTER — Encounter: Payer: Self-pay | Admitting: Internal Medicine

## 2014-06-16 VITALS — BP 128/64 | HR 121 | Ht 67.0 in

## 2014-06-16 DIAGNOSIS — E43 Unspecified severe protein-calorie malnutrition: Secondary | ICD-10-CM

## 2014-06-16 DIAGNOSIS — J479 Bronchiectasis, uncomplicated: Secondary | ICD-10-CM

## 2014-06-16 DIAGNOSIS — J9621 Acute and chronic respiratory failure with hypoxia: Secondary | ICD-10-CM

## 2014-06-16 DIAGNOSIS — D510 Vitamin B12 deficiency anemia due to intrinsic factor deficiency: Secondary | ICD-10-CM

## 2014-06-16 DIAGNOSIS — M545 Low back pain, unspecified: Secondary | ICD-10-CM

## 2014-06-16 DIAGNOSIS — R072 Precordial pain: Secondary | ICD-10-CM

## 2014-06-16 DIAGNOSIS — R079 Chest pain, unspecified: Secondary | ICD-10-CM | POA: Insufficient documentation

## 2014-06-16 DIAGNOSIS — M546 Pain in thoracic spine: Secondary | ICD-10-CM

## 2014-06-16 NOTE — Telephone Encounter (Signed)
Call[facility] and [facility]-she will need to call me back as she was with another patient and getting instructions for next visit.

## 2014-06-16 NOTE — Progress Notes (Signed)
Patient ID: Randalyn RheaJean B Savoia, female    DOB: 04/23/1941, 73 y.o.   MRN: [id number]  HPI 11/30/10-73 yo F never smoker, followed for bronchiectasis/ COPD, with hx treated atypical AFB/ MAIC, occasional hemoptysis and GERD.  Last here July 29, 2010 after normal flora on sputum cultures in October. Last antibiotic was augmentin in December. She questions if she should have had longer than 1 week treatment. Daily productive cough- yellow to light green. Shortness of breath with exertion across parking lot, up steps. . She does use Flutter occasionally. . No recent blood, fever, sweats or chest pain. Trial of Daliresp caused malaise.   03/01/11-73 yo F never smoker, followed for bronchiectasis/ COPD, with hx treated atypical AFB/ MAIC, occasional hemoptysis and GERD. No acute issues "just can't breathe and walk". She has avoided the weather extremes recently by staying in.  Cough always productive, greenish. Denies fever, sweat.  CXR- 11/30/10- reviewed with her- no definite new areas, but significant cystic bronchiectasis diffusely with some air fluid levels. PFT- 12/21/10- FEV1 1.02/ 49%; FEV1/FVC 0.54; no response to BD; TLC 0.77; DLCO 0.58    Severe obstructive disease, mild restriction.  6MWT She asks to defer formal oxygen assessment till [facility]. She has less cough in last few days, doesn't feel she could leave sputum specimen, but would like script for brand-name levaquin, which works better.   05/05/11- 73 yo F never smoker, followed for bronchiectasis/ COPD, with hx treated atypical AFB/ MAIC, occasional hemoptysis and GERD. We gave (she requested brand-name) levaquin to hold last visit. She felt she probably needed it because of pleuritic right chest pain that lasted 2-3 days, self limited. She isn't sure there was any change in her daily productive cough- usually white. Maybe some fever once. She waited because she had upset GI with a  little nausea and diarrhea. Still holding the levaquin.   07/06/11-  73 yo F never smoker, followed for bronchiectasis/ COPD, with hx treated atypical AFB/ MAIC, occasional hemoptysis and GERD. Has had flu vaccine. Has not taken the Levaquin prescription we gave previously. She was taking maintenance Zithromax to suppress bronchitis. She stayed off it for 2 weeks because of GI upset with cramps, after successfully taking it for 6 weeks. She has no GI discomfort now. She is going to try resuming maintenance Zithromax, but adding a probiotic. She did not tolerate Daliresp.  11/07/11-  73 yo F never smoker, followed for bronchiectasis/ COPD, with hx treated atypical AFB/ MAIC, occasional hemoptysis and GERD. Still coughing a lot, often productive, with looser mucus. Had tried maintenance Zithromax until she got GI upset. Remains on chronic Nexium for GERD. Discussed her recent reports of cardiac conduction problems with macrolide antibiotics. (REC- next visit- look at HR, chemistry)  01/19/12- 73 yo F never smoker, followed for bronchiectasis/ COPD, with hx treated atypical AFB/ MAIC, occasional hemoptysis and GERD. Coughing up blood-bright red; would happen off and on, but only on May 30 with no repeat today , . SOB and wheezing has increased. Always has some cough which has not changed. No easy bruising or other bleeding. No fever. Previous hemoptysis 2 or 3 years ago at which time we stopped the baby aspirin.  03/19/12-  73 yo F never smoker, followed for bronchiectasis/ COPD, with hx treated atypical AFB/ MAIC, occasional hemoptysis and GERD. Having increased SOB past 3 weeks; losing weight and unsure why; no energy.. Has lost 10 lbs since last visit.  140>130lbs. COPD assessment test (CAT) score 32/40. She complains of weakness, aching joints, weight loss. 2 episodes where she passed mucus per rectum-not blood. Dry eyes, nose and mouth times one month. Bloated with eating-takes her breath. Burning  she-asks B12 shot. Jittery. Has slept up in a chair for years because of chronic midthoracic back pain has not gone back to see Dr. [name] in a long time. She asks if all of this is a side effect of Nexium. We reviewed her radiology again. CXR 01/23/12-  IMPRESSION:  Stable chronic changes with scattered air-fluid levels, as  described above.  Original Report Authenticated By: [name], M.D.   04/02/12- 83 yo F never smoker, followed for  Cystic bronchiectasis/ COPD, with hx treated atypical AFB/ MAIC, occasional hemoptysis and GERD. [facility]03[facility]12 for dx bronchiectasis w/ acute exacerbation, COPD, anemia/ B12 deficiency, Failure to thrive.Treated vanc/ zithromax/ ceftazidime. Cultures all no growth. Severe diffuse cystic bronchiectasis. Had ID consult. ECHO 8/2- normal EF60%, unable to visualize for PA pressures. Treated B12 deficiency x 5 days, then outpat injection.  She has not re-established with [name]/ PCP and says there is not a current record there. Air conditioning broken, so she has been staying at their [facility] place. Eating better, not gaining weight. Weak. Thick white mucus in mouth. No fever, chills, blood, pain, nodes.   04/17/12- [name]/ INF.DIS. DISEASE, PULMONARY D/T MYCOBACTERIA - [name], MD 04/17/2012 12:24 PM Signed  She is not doing well based on her weight loss and probable recent exacerbation. I reviewed her CXR with her, there is not a huge difference vs her previous studies from 11-2010. She is offered to restart her MAI therapy but she wants "to get her stomach straightened out". She has quit taking her prilosec/nexium and is relying on tums now.will get a repeat sputum AFB on her (she is given a specimen container and will return after she is able to provide a good specimen).  Needs improved nutrition and probably home O2 (i encouraged her multiple times to call [name] about this). We may need GI help with her stomach issues.  Will see her  back on October 3.   04/30/12-  51 yo F never smoker, followed for severe Cystic bronchiectasis/ COPD, with hx treated atypical AFB/ MAIC, occasional hemoptysis and GERD. Unable to tell any difference with using O2 QHS-does not use O2 during the day. Increased SOB-not getting any better; Unable to drink much water. Minor epistaxis blamed on dryness from her oxygen. Son thinks/blood-tinged sputum occasionally, otherwise yellowish or greenish. She had stopped Nexium, blaming it for her poor appetite-it seemed to cause bloating. Instead she now uses TUMS and we discussed his meds. Complains of burning in her feet attributed by her to B12 deficiency. I again asked her to get back with her primary physician.  05/14/12- 56 yo F never smoker, followed for severe Cystic bronchiectasis/ COPD, with hx treated atypical AFB/ MAIC, occasional hemoptysis and GERD  Husband here Sputum cx 03/22/12- Nl flora. ACUTE VISIT: increased cough-yellow in color and Increased SOB-getting worse. Cough is chronically productive. In the last few days has felt more short of breath, nonspecific. Had flu vaccine. Using saline nasal spray for nasal dryness on home oxygen at 2 L. She is taking a liquid antacid instead of Nexium and admits reduced by mouth intake. Claims foods makes her bloated CXR 03/22/12- I reviewed the images with patient and husband IMPRESSION:  No significant change in the severe chronic changes as above.  Original Report Authenticated By: Cyndie ChimeKEVIN G. DOVER, M.D.   07/30/12- 73 yo F never smoker, followed for severe Cystic bronchiectasis/ COPD, with hx treated atypical AFB/ MAIC, occasional hemoptysis and GERD   FOLLOWS FOR: not able to eat or drink enough; sores in nose; not wearing O2 every night due to bumps in nose, feels weak and like she needs fluids. Has lost 20 lbs over past year. Always feels hot at first to keep her home about 564 which is too cold for family. [facility]  [place]. Always productive cough without change. Exertion makes her cough more productive. She will see Dr. Hatcher/Infectious Disease again in January. Continues oxygen 2 L per minute at night and as needed in the day. Never took last prescription for Augmentin in September because she lost the prescription. Wants to continue B12 shots. No numbness in her feet.  09/11/12- 89 yo F never smoker, followed for severe Cystic bronchiectasis/ COPD, with hx treated atypical AFB/ MAIC, occasional hemoptysis and GERD   ACUTE: 86% on RA on arrival-- Dr. Ninetta LightsHatcher/ ID saw her today and thought she should be seen, heard "rubbing" in her lungs today--diff breathing worsening over last "couple" weeks w even small activities-- dry cough, denies any wheezing or chest tightness.  Exercise tolerance always poor. No distinct event but may be more DOE in last 2 weeks. No change in chronic cough, sometimes productive of yellow green, occasional small streak of blood. No fever, nodes, palpitation or chest pain. Feet swell some, dependent. Very weak. Dyspnea and weakness caused by her lung disease interfere with routine activities- dressing, grooming, ambulating through home. She needs to be able to sit frequently, and needs assistance for sustained ambulation,. She should have a light wheel chair. I don't think she is strong enough to manage a regular weight chair by herself. .  Weight stable 121-123 since October. She didn't take augmentin when given because she says she wasn't taking enough fluids for it- discussed. Has O2 concentrator for sleep but tends to leave it off- prongs bother nose.   10/29/12- 73 yo F never smoker, followed for severe Cystic bronchiectasis/ COPD, with hx treated atypical AFB/ MAIC, occasional hemoptysis and GERD FOLLOWS FOR: Pt states she has not recently been wearing her O2 at night as she feels it makes her breathing worse during the day; States she has noticed increased SOB as well. 85% RA on  arrival-took approximately 2 minutes to reach 90% RA. She finally took the antibiotic we had given her. She stays short of breath but not wearing oxygen. Her portable tank is too heavy for her. We discussed alternatives including small concentrators. She has generally sought to minimize intervention and treatment. 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  Numerous air-fluid levels appear progressive and could be due to  infection.  Original Report Authenticated By: Rudie MeyerP. Gallerani, M.D.  11/29/12- 73 yo F never smoker, followed for severe Cystic bronchiectasis/ COPD, with hx treated atypical AFB/ MAIC, occasional hemoptysis and GERD    Husband is here. FOLLOWS FOR:Pt feels she gets worse when on O2( 2L/ Advanced); unsure of how her breathing is really doing We discussed her oxygen. She says she is "afraid" of oxygen tanks and uncomfortable working with them. We discussed portable concentrators. Her husband is aware that she frequently is not compliant with therapies.  12/11/12- 73 yo F never smoker, followed for severe Cystic bronchiectasis/ COPD, with hx treated atypical AFB/ MAIC,  occasional hemoptysis and GERD    Husband is here. Acute work-in today for increased cough and shortness of breath. She wanted a nebulizer treatment and Depo-Medrol. We discussed her B12 shots and I asked her to followup with her primary physician for long-term decision about this. She has a peripheral neuropathy but the biggest problems have been cachexia and some difficulty with medication compliance. She denies fever, chills, swollen glands. She has a followup appointment with infectious disease.  02/28/13-72 yo F never smoker, followed for severe Cystic bronchiectasis/ COPD, with hx treated atypical AFB/ MAIC, occasional hemoptysis and GERD   She is willing to use portable oxygen now that she has a Designer, jewelleryportable concentrator. She never felt safe with compressed oxygen tanks. Otherwise  little has changed. She remains very weak with persistent cough which is sometimes productive of yellow or green sputum. No blood, fever or adenopathy. She desaturates easily with exertion but is comfortable at rest on room air. CXR 12/17/12 IMPRESSION:  Severe chronic cystic bronchiectasis with superimposed infection,  slightly progressed in the left midzone.  Original Report Authenticated By: Francene BoyersJames Maxwell, M.D.   Review of Systems-See HPI Constitutional:   + weight loss, No-night sweats, No- Fevers, chills, +fatigue, lassitude. HEENT:   No headaches,  Difficulty swallowing,  Tooth/dental problems,  Sore throat,                No sneezing, itching, ear ache, nasal congestion, post nasal drip,  CV:  No chest pain, orthopnea, PND, swelling in lower extremities, anasarca, +dizziness, No-palpitations GI  No heartburn, indigestion, abdominal pain, nausea, vomiting, Resp: +Shortness of breath with exertion. , no recent coughing up of blood. .  Some minimal wheezing.  Skin: no rash or lesions. GU:   MS:  + joint pain or swelling.   Psych:  No change in mood or affect. No depression + anxiety.  No memory loss.  Objective:   Physical Exam General- Alert, Oriented, Affect-appropriate, Distress- none acute; thin. Looks chronically ill, wheelchair, O2 2L / concentrator 93$ Skin- rash-none, lesions- none, excoriation- none. +Dusky hands and feet Lymphadenopathy- none Head- atraumatic            Eyes- Gross vision intact, PERRLA, conjunctivae clear secretions            Ears- Hearing, canals- normal            Nose- Clear, No-Septal dev, mucus, polyps, erosion, perforation             Throat- Mallampati II , mucosa clear , drainage- none, tonsils- atrophic Neck- flexible , trachea midline, no stridor , thyroid nl, carotid no bruit Chest - symmetrical excursion , unlabored           Heart/CV- + remains regular,rapid RRR ., no murmur , no gallop  , no rub, nl s1 s2                            JVD+  full , edema +1, stasis changes- none, varices- none           Lung-  + bilateral  Rhonchi- clearer than last visit, unlabored, cough- mild , dullness-none, rub- none           Chest wall-  Abd-  Br/ Gen/ Rectal- Not done, not indicated Extrem- cyanosis- none, clubbing, none, atrophy- none, strength- nl Neuro- grossly intact to observation             Patient ID: Carney BernJean  Alphonzo Severance, female    DOB: 08/15/1941, 73 y.o.   MRN: [id number]  HPI 11/30/10-73 yo F never smoker, followed for bronchiectasis/ COPD, with hx treated atypical AFB/ MAIC, occasional hemoptysis and GERD.  Last here July 29, 2010 after normal flora on sputum cultures in October. Last antibiotic was augmentin in December. She questions if she should have had longer than 1 week treatment. Daily productive cough- yellow to light green. Shortness of breath with exertion across parking lot, up steps. . She does use Flutter occasionally. . No recent blood, fever, sweats or chest pain. Trial of Daliresp caused malaise.   03/01/11-73 yo F never smoker, followed for bronchiectasis/ COPD, with hx treated atypical AFB/ MAIC, occasional hemoptysis and GERD. No acute issues "just can't breathe and walk". She has avoided the weather extremes recently by staying in.  Cough always productive, greenish. Denies fever, sweat.  CXR- 11/30/10- reviewed with her- no definite new areas, but significant cystic bronchiectasis diffusely with some air fluid levels. PFT- 12/21/10- FEV1 1.02/ 49%; FEV1/FVC 0.54; no response to BD; TLC 0.77; DLCO 0.58    Severe obstructive disease, mild restriction.  She asks to defer formal oxygen assessment till she finishes spending summer at [facility] with her grandchildren. She has less cough in last few days, doesn't feel she could leave sputum specimen, but would like script for brand-name levaquin, which works better.   05/05/11- 1 yo F never smoker, followed for bronchiectasis/ COPD, with hx treated atypical  AFB/ MAIC, occasional hemoptysis and GERD. We gave (she requested brand-name) levaquin to hold last visit. She felt she probably needed it because of pleuritic right chest pain that lasted 2-3 days, self limited. She isn't sure there was any change in her daily productive cough- usually white. Maybe some fever once. She waited because she had upset GI with a little nausea and diarrhea. Still holding the levaquin.   07/06/11-  58 yo F never smoker, followed for bronchiectasis/ COPD, with hx treated atypical AFB/ MAIC, occasional hemoptysis and GERD. Has had flu vaccine. Has not taken the Levaquin prescription we gave previously. She was taking maintenance Zithromax to suppress bronchitis. She stayed off it for 2 weeks because of GI upset with cramps, after successfully taking it for 6 weeks. She has no GI discomfort now. She is going to try resuming maintenance Zithromax, but adding a probiotic. She did not tolerate Daliresp.  11/07/11-  43 yo F never smoker, followed for bronchiectasis/ COPD, with hx treated atypical AFB/ MAIC, occasional hemoptysis and GERD. Still coughing a lot, often productive, with looser mucus. Had tried maintenance Zithromax until she got GI upset. Remains on chronic Nexium for GERD. Discussed her recent reports of cardiac conduction problems with macrolide antibiotics. (REC- next visit- look at HR, chemistry)  01/19/12- 63 yo F never smoker, followed for bronchiectasis/ COPD, with hx treated atypical AFB/ MAIC, occasional hemoptysis and GERD. Coughing up blood-bright red; would happen off and on, but only on May 30 with no repeat today , . SOB and wheezing has increased. Always has some cough which has not changed. No easy bruising or other bleeding. No fever. Previous hemoptysis 2 or 3 years ago at which time we stopped the baby aspirin.  03/19/12-  78 yo F never smoker, followed for bronchiectasis/ COPD, with hx treated atypical AFB/ MAIC, occasional hemoptysis and  GERD. Having increased SOB past 3 weeks; losing weight and unsure why; no energy.. Has lost 10 lbs since last visit. 140>130lbs. COPD assessment test (  CAT) score 32/40. She complains of weakness, aching joints, weight loss. 2 episodes where she passed mucus per rectum-not blood. Dry eyes, nose and mouth times one month. Bloated with eating-takes her breath. Burning she-asks B12 shot. Jittery. Has slept up in a chair for years because of chronic midthoracic back pain has not gone back to see Dr. Ricki MillerPang in a long time. She asks if all of this is a side effect of Nexium. We reviewed her radiology again. CXR 01/23/12-  IMPRESSION:  Stable chronic changes with scattered air-fluid levels, as  described above.  Original Report Authenticated By: Charline BillsSRIYESH KRISHNAN, M.D.   04/02/12- 73 yo F never smoker, followed for  Cystic bronchiectasis/ COPD, with hx treated atypical AFB/ MAIC, occasional hemoptysis and GERD. Post Hospital-[facility]-03[facility]27[facility]12 for dx bronchiectasis w/ acute exacerbation, COPD, anemia/ B12 deficiency, Failure to thrive.Treated vanc/ zithromax/ ceftazidime. Cultures all no growth. Severe diffuse cystic bronchiectasis. Had ID consult. ECHO 8/2- normal EF60%, unable to visualize for PA pressures. Treated B12 deficiency x 5 days, then outpat injection.  She has not re-established with [name]/ PCP and says there is not a current record there. Air conditioning broken, so [facility]. Eating better, not gaining weight. Weak. Thick white mucus in mouth. No fever, chills, blood, pain, nodes.   04/17/12- [name]/ INF.DIS. DISEASE, PULMONARY D/T MYCOBACTERIA - Johny SaxJeffrey Hatcher, MD 04/17/2012 12:24 PM Signed  She is not doing well based on her weight loss and probable recent exacerbation. I reviewed her CXR with her, there is not a huge difference vs her previous studies from 11-2010. She is offered to restart her MAI therapy but she wants "to get her stomach straightened out".  She has quit taking her prilosec/nexium and is relying on tums now.will get a repeat sputum AFB on her (she is given a specimen container and will return after she is able to provide a good specimen).  Needs improved nutrition and probably home O2 (i encouraged her multiple times to call Dr Maple HudsonYoung about this). We may need GI help with her stomach issues.  Will see her back on October 3.   04/30/12-  73 yo F never smoker, followed for severe Cystic bronchiectasis/ COPD, with hx treated atypical AFB/ MAIC, occasional hemoptysis and GERD. Unable to tell any difference with using O2 QHS-does not use O2 during the day. Increased SOB-not getting any better; Unable to drink much water. Minor epistaxis blamed on dryness from her oxygen. Son thinks/blood-tinged sputum occasionally, otherwise yellowish or greenish. She had stopped Nexium, blaming it for her poor appetite-it seemed to cause bloating. Instead she now uses TUMS and we discussed his meds. Complains of burning in her feet attributed by her to B12 deficiency. I again asked her to get back with her primary physician.  05/14/12- 73 yo F never smoker, followed for severe Cystic bronchiectasis/ COPD, with hx treated atypical AFB/ MAIC, occasional hemoptysis and GERD  Husband here Sputum cx 03/22/12- Nl flora. ACUTE VISIT: increased cough-yellow in color and Increased SOB-getting worse. Cough is chronically productive. In the last few days has felt more short of breath, nonspecific. Had flu vaccine. Using saline nasal spray for nasal dryness on home oxygen at 2 L. She is taking a liquid antacid instead of Nexium and admits reduced by mouth intake. Claims foods makes her bloated CXR 03/22/12- I reviewed the images with patient and husband IMPRESSION:  No significant change in the severe chronic changes as above.  Original Report Authenticated By:  Cyndie Chime, M.D.   07/30/12- 49 yo F never smoker, followed for severe Cystic bronchiectasis/ COPD, with hx  treated atypical AFB/ MAIC, occasional hemoptysis and GERD   FOLLOWS FOR: not able to eat or drink enough; sores in nose; not wearing O2 every night due to bumps in nose, feels weak and like she needs fluids. Has lost 20 lbs over past year. Always feels hot at first to keep her home about 52 which is too cold for family. Still spending most of her time down at [facility]. Always productive cough without change. Exertion makes her cough more productive. She will see Dr. Hatcher/Infectious Disease again in January. Continues oxygen 2 L per minute at night and as needed in the day. Never took last prescription for Augmentin in September because she lost the prescription. Wants to continue B12 shots. No numbness in her feet.  09/11/12- 14 yo F never smoker, followed for severe Cystic bronchiectasis/ COPD, with hx treated atypical AFB/ MAIC, occasional hemoptysis and GERD   ACUTE: 86% on RA on arrival-- Dr. Ninetta Lights ID saw her today and thought she should be seen, heard "rubbing" in her lungs today--diff breathing worsening over last "couple" weeks w even small activities-- dry cough, denies any wheezing or chest tightness.  Exercise tolerance always poor. No distinct event but may be more DOE in last 2 weeks. No change in chronic cough, sometimes productive of yellow green, occasional small streak of blood. No fever, nodes, palpitation or chest pain. Feet swell some, dependent. Very weak. Dyspnea and weakness caused by her lung disease interfere with routine activities- dressing, grooming, ambulating through home. She needs to be able to sit frequently, and needs assistance for sustained ambulation,. She should have a light wheel chair. I don't think she is strong enough to manage a regular weight chair by herself. .  Weight stable 121-123 since October. She didn't take augmentin when given because she says she wasn't taking enough fluids for it- discussed. Has O2 concentrator for sleep but tends to leave it  off- prongs bother nose.   10/29/12- 53 yo F never smoker, followed for severe Cystic bronchiectasis/ COPD, with hx treated atypical AFB/ MAIC, occasional hemoptysis and GERD FOLLOWS FOR: Pt states she has not recently been wearing her O2 at night as she feels it makes her breathing worse during the day; States she has noticed increased SOB as well. 85% RA on arrival-took approximately 2 minutes to reach 90% RA. She finally took the antibiotic we had given her. She stays short of breath but not wearing oxygen. Her portable tank is too heavy for her. We discussed alternatives including small concentrators. She has generally sought to minimize intervention and treatment. 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  Numerous air-fluid levels appear progressive and could be due to  infection.  Original Report Authenticated By: Rudie Meyer, M.D.  11/29/12- 3 yo F never smoker, followed for severe Cystic bronchiectasis/ COPD, with hx treated atypical AFB/ MAIC, occasional hemoptysis and GERD    Husband is here. FOLLOWS FOR:Pt feels she gets worse when on O2( 2L/ Advanced); unsure of how her breathing is really doing We discussed her oxygen. She says she is "afraid" of oxygen tanks and uncomfortable working with them. We discussed portable concentrators. Her husband is aware that she frequently is not compliant with therapies.  12/11/12- 21 yo F never smoker, followed for severe Cystic bronchiectasis/ COPD, with hx treated atypical AFB/ MAIC, occasional hemoptysis and GERD  Husband is here. Acute work-in today for increased cough and shortness of breath. She wanted a nebulizer treatment and Depo-Medrol. We discussed her B12 shots and I asked her to followup with her primary physician for long-term decision about this. She has a peripheral neuropathy but the biggest problems have been cachexia and some difficulty with medication compliance. She denies fever, chills,  swollen glands. She has a followup appointment with infectious disease.  02/28/13-72 yo F never smoker, followed for severe Cystic bronchiectasis/ COPD, with hx treated atypical AFB/ MAIC, occasional hemoptysis and GERD   She is willing to use portable oxygen now that she has a Designer, jewelleryportable concentrator. She never felt safe with compressed oxygen tanks. Otherwise little has changed. She remains very weak with persistent cough which is sometimes productive of yellow or green sputum. No blood, fever or adenopathy. She desaturates easily with exertion but is comfortable at rest on room air. CXR 12/17/12 IMPRESSION:  Severe chronic cystic bronchiectasis with superimposed infection,  slightly progressed in the left midzone.  Original Report Authenticated By: Francene BoyersJames Maxwell, M.D.  06/25/13- 73 yo F never smoker, followed for severe Cystic bronchiectasis/ COPD, with hx treated atypical AFB/ MAIC, occasional hemoptysis and GERD    Husband heer FOLLOWS FOR: Pt states her breathing has gotten worse; was seen by Dr Ninetta LightsHatcher yesterday and her O2 levels were dropping. Told to follow up today. She says she desaturated while coughing at Dr Moshe CiproHatcher's. More SOB/ DOE 3 weeks. Chronic cough not changed- brownish, no blood, fever or chills. She tries using Flutter but it is not helping enough. We discussed pneumatic VEST. O2 2-3L/Min [employer] portable concentrator.   11/18/13- 73 yo F never smoker, followed for severe Cystic bronchiectasis/ COPD, with hx treated atypical AFB/ MAIC, occasional hemoptysis and GERD    Husband here  11/18/13- 73 yo F never smoker, followed for severe Cystic bronchiectasis/ COPD, with hx treated atypical AFB/ MAIC, occasional hemoptysis and GERD    Husband here FOLLOWS FOR: Pt c/o an increase in SOB x 2 weeks. Pt has productive cough with brown mucous x years. Pt was 84% when waiting in lobby, pt stated it was d/t coughing, with rest pt O2 increase to 92% Recent viral system acute infection started  about 2 weeks ago with increased cough and shortness of breath. Sputum white or dark with scant blood streak. Doesn't know if she had fever. Mild GI upset. Continues oxygen O2 2-3L/Min [employer] portable concentrator  01/02/14- 73 yo F never smoker, followed for severe Cystic bronchiectasis/ COPD, with hx treated atypical AFB/ MAIC, occasional hemoptysis and GERD    Husband here ACUTE VISIT: increased SOB and wheezing x 1 week at least. Cough-productive-yellow to brown in color. Stays hot as well. Pt has feelings of passing out at times. O2 2-3L [employer] portable concentrator  01/21/2014 Post Hospital follow up  Patient presents for a post hospital followup. Patient was admitted, may 19 2 may 28th for an acute exacerbation of bronchiectasis with acute on chronic respiratory failure She was treated with aggressive IV antibiotics, nebulized bronchodilators.  She did have a mild decompensation of diastolic heart failure requiring gentle diuresis.  Reports some good days and bad since discharge.   Does report some occasional dyspnea, wheezing, tightness, cough. Encouraged on VEST compliance. (not using)  On 2-3 l/m Oxygen .  Was started on Atrovent and Xopenex neb at discharge.   03/16/14- 73 yo F never smoker, followed for severe Cystic bronchiectasis/ COPD, with hx treated atypical AFB/ MAIC, occasional hemoptysis  and GERD    Husband here FOLLOWS FOR:  Still having sob, cough with mucus clear.  Reports have great difficulty catching her breath   CXR 01/02/14 IMPRESSION:  Again noted patchy opacification of the lung parenchyma bilaterally  with bronchiectasis and multiple cystic changes with air-fluid  levels without significant change from prior exam. No definite  superimposed confluent segmental infiltrate. No pulmonary edema.  Electronically Signed  By: Natasha Mead M.D.  On: 01/02/2014 16:21  06/04/14- 70 yo F never smoker, followed for severe Cystic bronchiectasis/ COPD, with hx treated atypical  AFB/ MAIC, occasional hemoptysis and GERD    Husband here FOLLOW FOR:  hospital discharge, was not able to eat, had bronchial infection, sob Last ov we gave depo 80 Has been in hosp w blood sugar management, followed up by ID and her primary physician. Sputum cx Sept grew Nl Flora. AFB still pending. She mostly wanted to talk about her diet. Finish prednisone and Zithromax from hospital discharge. CXR 05/17/14 IMPRESSION:  Stable diffuse cystic change seen throughout both lungs with  associated reticular nodular densities. There is no significant  change compared to prior exam. These findings most likely represent  sequela of previous severe or atypical infection.  Electronically Signed  By: Roque Lias M.D.  On: 05/17/2014 20:03   06/16/14- 36 yo F never smoker, followed for severe Cystic bronchiectasis/ COPD, with hx treated atypical AFB/ MAIC, occasional hemoptysis and GERD    Husband here FOLLOWS FOR: Pt c/o intermittent chest tightness with exertion, sob with exertion, prod cough with dark creamy mucus.  Sputum cultures from late September normal flora. C/O low/mid back pain radiating around bilat ribs at times in last week- not present now. No dysuria. C/O mid- sternal ache lasting approx 15 mn twice in past week, no nausea, palpitation or sweat, may radiate around bilat upper chest bilat. Had sharp ache into R ear with catch in R TMJ. She had some trouble describing or distinguishing these feelings, triggers unclear.   Review of Systems-See HPI Constitutional:   No-weight loss, No-night sweats, No- Fevers, chills, +fatigue, lassitude. HEENT:   No headaches,  Difficulty swallowing,  Tooth/dental problems,  Sore throat,                No sneezing, itching, ear ache, nasal congestion, post nasal drip,  CV:  +chest pain, No-orthopnea, PND, swelling in lower extremities, anasarca,  No-palpitations GI  No heartburn, indigestion, abdominal pain, nausea, vomiting, Resp: +Shortness of  breath with exertion. cough Skin: no rash or lesions. GU:   MS:  + joint pain or swelling , wheelchair Psych:  No change in mood or affect. No depression + anxiety.  No memory loss.  Objective:   Physical Exam General- Alert, Oriented, Affect-appropriate, Distress- none acute; thin. Looks chronically ill,              +wheelchair, O2 2L. On first arrival sat 88% on her pulse O2 3L, up to 90% with rest. Skin- rash-none, lesions- none, excoriation- none.  Lymphadenopathy- none Head- atraumatic            Eyes- Gross vision intact, PERRLA, conjunctivae clear secretions            Ears- Hearing, canals- normal            Nose- Clear, No-Septal dev, mucus, polyps, erosion, perforation             Throat- Mallampati II , mucosa clear , drainage- none, tonsils- atrophic Neck- flexible , trachea  midline, no stridor , thyroid nl, carotid no bruit Chest - symmetrical excursion , unlabored           Heart/CV- + remains regular,rapid RRR ., no murmur , no gallop  , no rub, nl s1 s2                            JVD+1 CM , edema-none, stasis changes- none, varices- none           Lung-  + bilateral  Rhonchi R>L, unlabored, cough- mild , dullness-none, rub- none           Chest wall- +Kyphotic posture, sat straight with effort. No shock tender spine Abd-  Br/ Gen/ Rectal- Not done, not indicated Extrem- cyanosis- none, clubbing, none, atrophy- none, strength- nl Neuro- grossly intact to observation

## 2014-06-16 NOTE — Assessment & Plan Note (Signed)
Some effort to eat better.

## 2014-06-16 NOTE — Assessment & Plan Note (Signed)
Seems to feel this just below strap level, radiating around bilateral ribs. She doesn't distinguish well with what seems to be a different pain at mid anterior chest. No clear shock tenderness or dysuria. Looking at her, suspect degenerative changes in chronically slumped spine.  She is to see IM about her precordial pain, and to start BASA meanwhile. Asked her to pay more attention to both discomforts for clearer descriptions.Try heating pad.

## 2014-06-16 NOTE — Assessment & Plan Note (Signed)
Discussed Oxygen management. She will discuss smaller O2 concentrator options with her DME

## 2014-06-16 NOTE — Patient Instructions (Signed)
For the chest pain start aspirin 81 mg every other day. Let us know if you begin coughing blood. Ask Internal Medicine downstairs for someone to see you earlier since you have been having chest pain.    Ask your DME company about options for portable O2 concentrators   Keep December 17 appointment

## 2014-06-16 NOTE — Assessment & Plan Note (Signed)
B12 from PCP. I told her that doctor would be the one managing the problem

## 2014-06-16 NOTE — Assessment & Plan Note (Signed)
Can't tell that her chronic poor condition is any worse. Mostly severe post-inflammatory scarring with weakness/ debilitation and chronic hypoxic respiratory failure.

## 2014-06-16 NOTE — Assessment & Plan Note (Signed)
Probably non-cardiac, but history is difficult. More likely this is musculoskeletal, at multiple levels, with nerve root irritation from developing degenerative disk disease with her habitual poor posture. Until she can be evaluated by primary care, I suggested she resume BASA and pay more attention to pattern. She is at risk for hemoptysis due to her chronic pulmonary scarring.

## 2014-06-19 NOTE — Telephone Encounter (Signed)
LMTCB-we need to know what Duwayne HeckDanielle is needing on patient-PCP takes care of her BS numbers but noticed part of an order/statement CY made at last OV.

## 2014-06-22 ENCOUNTER — Ambulatory Visit (INDEPENDENT_AMBULATORY_CARE_PROVIDER_SITE_OTHER): Payer: Medicare Other | Admitting: Internal Medicine

## 2014-06-22 ENCOUNTER — Other Ambulatory Visit (INDEPENDENT_AMBULATORY_CARE_PROVIDER_SITE_OTHER): Payer: Medicare Other

## 2014-06-22 ENCOUNTER — Encounter: Payer: Self-pay | Admitting: Internal Medicine

## 2014-06-22 VITALS — BP 122/70 | HR 114 | Temp 98.3°F | Resp 17

## 2014-06-22 DIAGNOSIS — M546 Pain in thoracic spine: Secondary | ICD-10-CM

## 2014-06-22 DIAGNOSIS — E43 Unspecified severe protein-calorie malnutrition: Secondary | ICD-10-CM

## 2014-06-22 DIAGNOSIS — R739 Hyperglycemia, unspecified: Secondary | ICD-10-CM

## 2014-06-22 DIAGNOSIS — J471 Bronchiectasis with (acute) exacerbation: Secondary | ICD-10-CM

## 2014-06-22 LAB — HEMOGLOBIN A1C: Hgb A1c MFr Bld: 6.3 % (ref 4.6–6.5)

## 2014-06-22 MED ORDER — TRAMADOL HCL 50 MG PO TABS: ORAL_TABLET | ORAL | Status: DC

## 2014-06-22 NOTE — Progress Notes (Signed)
Pre visit review using our clinic review tool, if applicable. No additional management support is needed unless otherwise documented below in the visit note. 

## 2014-06-22 NOTE — Progress Notes (Signed)
   Subjective:    Patient ID: Stacy Novak, female    DOB: 10/12/1940, 73 y.o.   MRN: [id number]  HPI    She's been having pain from the cervical spine into the thoracic spine. This had been intermittent but has not been present for several days. She did not take any meds for this;she states she does not wish to take pain medicine.  It had been described as sharp and aching. It also radiates around the thorax bilaterally. It is not associated with exertion. She describes chronic nausea  At this time her major concern is profoundly low O2 sats. She's had increasing shortness of breath the last 3 days. Oxygen saturations at home range from 78-88 percent at rest.  She's had no associated fever or chills.  She has a chronic cough with yellow sputum production   Review of Systems She has had a profoundly low albumins in the range of 2.8 chronically. She has a protein supplement ordered but only takes it 1-2 times per week.  The flowsheet indicates that she has lost approximately 11 pounds in the last 27 months.Recent nonfasting glucose was 139. She had been on oral steroids following her most recent hospitalization.     Objective:   Physical Exam   Positive or pertinent findings include: She appears cachectic. She has a resting tachycardia with a rate of 115; flow murmur suggested. O2 sats were 92%. She has dry rales in all lung fields Clubbing of the nailbeds is present. Pedal pulses are decreased.  General appearance :in wheelchair; in no distress. Eyes: No conjunctival inflammation or scleral icterus is present. Oral exam: Dental hygiene is good. Lips and gums are healthy appearing.There is no oropharyngeal erythema or exudate noted.  Abdomen: bowel sounds normal, soft and non-tender without masses, organomegaly or hernias noted.  No guarding or rebound.  No thoracic spine tenderness to percussion. Vascular : all pulses equal ; no bruits present. Skin:Warm & dry.  Intact without  suspicious lesions or rashes ; no jaundice .Slight tenting Lymphatic: No lymphadenopathy is noted about the head, neck, axilla              Assessment & Plan:  #1 profound hypoxemia; the tachycardia will make it difficult to get an adequate O2 sat assessment. If she's having untreated pain she'll undoubtedly have tachycardia & desaturation.  #2 she has failure to thrive with cachexia. She is not compliant with the protein supplement.  #3 hyperglycemia; this is most likely related to the steroids. A1c will be checked.

## 2014-06-22 NOTE — Assessment & Plan Note (Signed)
End stage bronchiectatic lung disease limits options;she is not a candidate for invasive studies

## 2014-06-22 NOTE — Assessment & Plan Note (Addendum)
  Protein supplement recommended daily with recheck of albumin level  after 12 weeks.. Other options include plant protein (Ex soy) sources from [facility] as a supplement.Dose should not exceed per package label.

## 2014-06-22 NOTE — Patient Instructions (Signed)
  Albumin reflects nutrition; a can of Breeze recommended daily with recheck of albumin level  after 12 weeks.Other options include plant protein (Ex soy) sources from [facility] as a supplement.Dose should not exceed per package label.

## 2014-06-24 NOTE — Telephone Encounter (Signed)
[name]

## 2014-06-26 NOTE — Telephone Encounter (Signed)
[facility], states that nothing further needed. BS's to be followed buy the patients PCP. Nothing needed from our office.

## 2014-07-09 ENCOUNTER — Ambulatory Visit (INDEPENDENT_AMBULATORY_CARE_PROVIDER_SITE_OTHER): Payer: Medicare Other | Admitting: *Deleted

## 2014-07-09 DIAGNOSIS — D519 Vitamin B12 deficiency anemia, unspecified: Secondary | ICD-10-CM

## 2014-07-09 MED ORDER — CYANOCOBALAMIN 1000 MCG/ML IJ SOLN
1000.0000 ug | Freq: Once | INTRAMUSCULAR | Status: AC
  Administered 2014-07-09: 1000 ug via INTRAMUSCULAR

## 2014-07-22 ENCOUNTER — Telehealth: Payer: Self-pay | Admitting: Internal Medicine

## 2014-07-22 DIAGNOSIS — J9621 Acute and chronic respiratory failure with hypoxia: Secondary | ICD-10-CM

## 2014-07-22 NOTE — Telephone Encounter (Signed)
Spoke with Danielle/Nurse Pt had a fall Friday, was sleeping and rolled out of bed, hit her head, bruising on left arm and increased SOB.  Pt states that she was laying in the floor for 1 hour before her husband and son returned back home to help her up. Pt never called her PCP regarding the fall. Danielle visited the patient in the home today and assessed her, states that she does not look very bruised and the pt states that she is not as sore as she was Friday and Saturday. Duwayne HeckDanielle is concerned with the patients O2 levels and increased SOB.   Pt O2 dropping into low 80's with talking (@rest ) while using O2. Currently uses 3-4Lpm. Pt is needing a more efficient portable O2 tank - states that she uses 3-4liters and her current POC does not go to 4. Nurse states that the patient is concerned with leaving her home bc her current POC does not seem to be holding her O2 levels steady.   Please advise Dr Maple HudsonYoung. Thanks.

## 2014-07-22 NOTE — Telephone Encounter (Signed)
Order placed for poc.  Nothing further needed.  

## 2014-07-22 NOTE — Telephone Encounter (Signed)
Ok order her DME assess for portable O2 concentrator or alternative capable of delivering 4 L O2 for exertion.

## 2014-07-27 ENCOUNTER — Telehealth: Payer: Self-pay | Admitting: Internal Medicine

## 2014-07-27 NOTE — Telephone Encounter (Signed)
lmomtcb x1 for cindy w/ [employer].

## 2014-07-27 NOTE — Telephone Encounter (Signed)
[facility]

## 2014-07-28 ENCOUNTER — Telehealth: Payer: Self-pay | Admitting: Internal Medicine

## 2014-07-28 DIAGNOSIS — J479 Bronchiectasis, uncomplicated: Secondary | ICD-10-CM

## 2014-07-28 NOTE — Telephone Encounter (Signed)
Ok to order G2 portable oxygen system from [employer] allowing up to 6l pulsed O2 flow.

## 2014-07-28 NOTE — Telephone Encounter (Signed)
Spoke with Danielle with [facility].  She states pt is still very anxious about her oxygen and this causes her to have increased sob.  Spoke with pt and she states that her breathing is about the same but she still does not want to leave her home until she gets a better POC that will maintain her sats better.  [employer] is her provider and they told pt they have a POC that can deliver 6L.  There is a separate message on this pt from [employer].  Instructed pt that we are waiting for them to call us back to give further orders.  Instructed pt that if symptoms worsen to call our office or go to nearest ED.  Will close this message and refer to other message from [employer].

## 2014-07-28 NOTE — Telephone Encounter (Signed)
Called and spoke to pt's son. Informed son of the order. Order placed. Son verbalized understanding and denied any further questions or concerns at this time.

## 2014-07-28 NOTE — Telephone Encounter (Signed)
LMTC x 1 for Cindy - Please inform pt also once we have spoke with [facility]

## 2014-07-28 NOTE — Telephone Encounter (Signed)
Called spoke with patient Thought pt wanted continuous flow 24/7, pt just wants a portable O2 system that has a high pulsed setting.  Pt found out that [employer] has a system that goes up to 6L pulsed and would like to try this.  Pt has already been in touch with [employer] regarding this and per the 07/27/14 phone note, our office has left a message for Arline Aspindy w/ [employer] to call back (aforementioned message copied/pasted into this message).  Contacts       Type Contact Phone    07/27/2014 12:20 PM Phone (Incoming) [facility]     [phone] till 1 pm central Stacy Novak calling about a portable machine with pulse up to 6 instead of countinous    Call Documentation      Stacy Miyamotoenise Phelps, RN at 07/28/2014 10:51 AM     Status: Signed       Expand All Collapse All   LMTC x 1 for Stacy Novak - Please inform pt also once we have spoke with [facility]            Stacy Novak, CMA at 07/27/2014 4:27 PM     Status: Signed       Expand All Collapse All   lmomtcb x1 for Stacy Novak w/ [employer].        Would like to know if CY would approve their G2 system - high flow that goes up to 6L pulsed vs constant flow as pt does not want to go back to tanks Rx was received from our office to [employer] on 07/23/14 Okay to LM on Stacy Novak's voicemail and if okay for the G2 system, order can be faxed to [phone]  Dr Maple HudsonYoung please advise, thank you.

## 2014-07-28 NOTE — Telephone Encounter (Signed)
Message signed and documented in 12/8 phone note dealing w/ same issue

## 2014-08-01 ENCOUNTER — Inpatient Hospital Stay (HOSPITAL_COMMUNITY)
Admission: EM | Admit: 2014-08-01 | Discharge: 2014-08-05 | DRG: 189 | Disposition: A | Discharge status: Skilled Nursing Facility | Payer: Medicare Other | Attending: Internal Medicine | Admitting: Internal Medicine

## 2014-08-01 ENCOUNTER — Encounter (HOSPITAL_COMMUNITY): Payer: Self-pay | Admitting: Emergency Medicine

## 2014-08-01 ENCOUNTER — Emergency Department (HOSPITAL_COMMUNITY): Payer: Medicare Other

## 2014-08-01 DIAGNOSIS — Z882 Allergy status to sulfonamides status: Secondary | ICD-10-CM | POA: Diagnosis not present

## 2014-08-01 DIAGNOSIS — J441 Chronic obstructive pulmonary disease with (acute) exacerbation: Secondary | ICD-10-CM | POA: Diagnosis present

## 2014-08-01 DIAGNOSIS — J9621 Acute and chronic respiratory failure with hypoxia: Secondary | ICD-10-CM | POA: Diagnosis present

## 2014-08-01 DIAGNOSIS — J9620 Acute and chronic respiratory failure, unspecified whether with hypoxia or hypercapnia: Secondary | ICD-10-CM | POA: Diagnosis present

## 2014-08-01 DIAGNOSIS — A319 Mycobacterial infection, unspecified: Secondary | ICD-10-CM | POA: Diagnosis present

## 2014-08-01 DIAGNOSIS — J9622 Acute and chronic respiratory failure with hypercapnia: Secondary | ICD-10-CM | POA: Diagnosis present

## 2014-08-01 DIAGNOSIS — R0602 Shortness of breath: Secondary | ICD-10-CM | POA: Diagnosis not present

## 2014-08-01 DIAGNOSIS — M546 Pain in thoracic spine: Secondary | ICD-10-CM

## 2014-08-01 DIAGNOSIS — D510 Vitamin B12 deficiency anemia due to intrinsic factor deficiency: Secondary | ICD-10-CM | POA: Diagnosis present

## 2014-08-01 DIAGNOSIS — J189 Pneumonia, unspecified organism: Secondary | ICD-10-CM

## 2014-08-01 DIAGNOSIS — J45909 Unspecified asthma, uncomplicated: Secondary | ICD-10-CM | POA: Diagnosis present

## 2014-08-01 DIAGNOSIS — R627 Adult failure to thrive: Secondary | ICD-10-CM | POA: Diagnosis present

## 2014-08-01 DIAGNOSIS — J471 Bronchiectasis with (acute) exacerbation: Secondary | ICD-10-CM | POA: Diagnosis present

## 2014-08-01 DIAGNOSIS — Z681 Body mass index (BMI) 19 or less, adult: Secondary | ICD-10-CM | POA: Diagnosis not present

## 2014-08-01 DIAGNOSIS — E43 Unspecified severe protein-calorie malnutrition: Secondary | ICD-10-CM | POA: Diagnosis present

## 2014-08-01 DIAGNOSIS — W19XXXA Unspecified fall, initial encounter: Secondary | ICD-10-CM

## 2014-08-01 DIAGNOSIS — R0902 Hypoxemia: Secondary | ICD-10-CM | POA: Diagnosis present

## 2014-08-01 DIAGNOSIS — K219 Gastro-esophageal reflux disease without esophagitis: Secondary | ICD-10-CM | POA: Diagnosis present

## 2014-08-01 DIAGNOSIS — I10 Essential (primary) hypertension: Secondary | ICD-10-CM | POA: Diagnosis present

## 2014-08-01 LAB — I-STAT ARTERIAL BLOOD GAS, ED
Acid-Base Excess: 10 mmol/L — ABNORMAL HIGH (ref 0.0–2.0)
Bicarbonate: 37.9 mEq/L — ABNORMAL HIGH (ref 20.0–24.0)
O2 Saturation: 85 %
TCO2: 40 mmol/L (ref 0–100)
pCO2 arterial: 62.7 mmHg (ref 35.0–45.0)
pH, Arterial: 7.386 (ref 7.350–7.450)
pO2, Arterial: 52 mmHg — ABNORMAL LOW (ref 80.0–100.0)

## 2014-08-01 LAB — BASIC METABOLIC PANEL
Anion gap: 9 (ref 5–15)
BUN: 18 mg/dL (ref 6–23)
CHLORIDE: 94 meq/L — AB (ref 96–112)
CO2: 38 meq/L — AB (ref 19–32)
Calcium: 9.2 mg/dL (ref 8.4–10.5)
Creatinine, Ser: 0.32 mg/dL — ABNORMAL LOW (ref 0.50–1.10)
GFR calc non Af Amer: >90 mL/min (ref >90)
Glucose, Bld: 78 mg/dL (ref 70–99)
Potassium: 4.3 mEq/L (ref 3.7–5.3)
SODIUM: 141 meq/L (ref 137–147)

## 2014-08-01 LAB — CBC
HEMATOCRIT: 44 % (ref 36.0–46.0)
Hemoglobin: 13.2 g/dL (ref 12.0–15.0)
MCH: 27.2 pg (ref 26.0–34.0)
MCHC: 30 g/dL (ref 30.0–36.0)
MCV: 90.5 fL (ref 78.0–100.0)
Platelets: 311 10*3/uL (ref 150–400)
RBC: 4.86 MIL/uL (ref 3.87–5.11)
RDW: 13 % (ref 11.5–15.5)
WBC: 9.4 10*3/uL (ref 4.0–10.5)

## 2014-08-01 LAB — MRSA PCR SCREENING: MRSA BY PCR: NEGATIVE

## 2014-08-01 LAB — I-STAT TROPONIN, ED: TROPONIN I, POC: 0 ng/mL (ref 0.00–0.08)

## 2014-08-01 LAB — TSH: TSH: 0.47 u[IU]/mL (ref 0.350–4.500)

## 2014-08-01 MED ORDER — ACETAMINOPHEN 650 MG RE SUPP: 650.0000 mg | Freq: Four times a day (QID) | RECTAL | Status: DC | PRN

## 2014-08-01 MED ORDER — ALUM & MAG HYDROXIDE-SIMETH 200-200-20 MG/5ML PO SUSP: 30.0000 mL | Freq: Four times a day (QID) | ORAL | Status: DC | PRN

## 2014-08-01 MED ORDER — ALBUTEROL (5 MG/ML) CONTINUOUS INHALATION SOLN
10.0000 mg/h | INHALATION_SOLUTION | RESPIRATORY_TRACT | Status: DC
  Filled 2014-08-01: qty 20

## 2014-08-01 MED ORDER — ENOXAPARIN SODIUM 40 MG/0.4ML ~~LOC~~ SOLN
40.0000 mg | SUBCUTANEOUS | Status: DC
  Administered 2014-08-01 – 2014-08-04 (×4): 40 mg via SUBCUTANEOUS
  Filled 2014-08-01 (×5): qty 0.4

## 2014-08-01 MED ORDER — LEVALBUTEROL HCL 1.25 MG/0.5ML IN NEBU
1.2500 mg | INHALATION_SOLUTION | Freq: Once | RESPIRATORY_TRACT | Status: AC
  Administered 2014-08-01: 1.25 mg via RESPIRATORY_TRACT
  Filled 2014-08-01: qty 0.5

## 2014-08-01 MED ORDER — METHYLPREDNISOLONE SODIUM SUCC 125 MG IJ SOLR
60.0000 mg | Freq: Four times a day (QID) | INTRAMUSCULAR | Status: DC
  Filled 2014-08-01 (×3): qty 0.96

## 2014-08-01 MED ORDER — IPRATROPIUM BROMIDE 0.02 % IN SOLN
0.5000 mg | Freq: Three times a day (TID) | RESPIRATORY_TRACT | Status: DC
  Administered 2014-08-01: 0.5 mg via RESPIRATORY_TRACT
  Filled 2014-08-01: qty 2.5

## 2014-08-01 MED ORDER — ENSURE COMPLETE PO LIQD
237.0000 mL | Freq: Three times a day (TID) | ORAL | Status: DC
  Administered 2014-08-02 – 2014-08-03 (×2): 237 mL via ORAL

## 2014-08-01 MED ORDER — OXYCODONE HCL 5 MG PO TABS: 5.0000 mg | ORAL_TABLET | ORAL | Status: DC | PRN

## 2014-08-01 MED ORDER — METHYLPREDNISOLONE SODIUM SUCC 125 MG IJ SOLR
60.0000 mg | Freq: Four times a day (QID) | INTRAMUSCULAR | Status: DC
  Administered 2014-08-01 – 2014-08-02 (×2): 60 mg via INTRAVENOUS
  Filled 2014-08-01 (×5): qty 0.96

## 2014-08-01 MED ORDER — DEXTROSE 5 % IV SOLN
500.0000 mg | INTRAVENOUS | Status: DC
  Administered 2014-08-01 – 2014-08-04 (×3): 500 mg via INTRAVENOUS
  Filled 2014-08-01 (×4): qty 500

## 2014-08-01 MED ORDER — ACETAMINOPHEN 325 MG PO TABS: 650.0000 mg | ORAL_TABLET | Freq: Four times a day (QID) | ORAL | Status: DC | PRN

## 2014-08-01 MED ORDER — SODIUM CHLORIDE 0.9 % IJ SOLN
3.0000 mL | Freq: Two times a day (BID) | INTRAMUSCULAR | Status: DC
  Administered 2014-08-01 – 2014-08-05 (×6): 3 mL via INTRAVENOUS

## 2014-08-01 MED ORDER — ONDANSETRON HCL 4 MG/2ML IJ SOLN: 4.0000 mg | Freq: Three times a day (TID) | INTRAMUSCULAR | Status: DC | PRN

## 2014-08-01 MED ORDER — POLYETHYL GLYCOL-PROPYL GLYCOL 0.4-0.3 % OP SOLN: 1.0000 [drp] | Freq: Every day | OPHTHALMIC | Status: DC | PRN

## 2014-08-01 MED ORDER — ONDANSETRON HCL 4 MG PO TABS: 4.0000 mg | ORAL_TABLET | Freq: Four times a day (QID) | ORAL | Status: DC | PRN

## 2014-08-01 MED ORDER — POLYVINYL ALCOHOL 1.4 % OP SOLN
1.0000 [drp] | Freq: Every day | OPHTHALMIC | Status: DC | PRN
  Filled 2014-08-01: qty 15

## 2014-08-01 MED ORDER — CEFEPIME HCL 1 G IJ SOLR
1.0000 g | Freq: Three times a day (TID) | INTRAMUSCULAR | Status: DC
  Administered 2014-08-01 – 2014-08-04 (×8): 1 g via INTRAVENOUS
  Filled 2014-08-01 (×10): qty 1

## 2014-08-01 MED ORDER — PANTOPRAZOLE SODIUM 40 MG PO TBEC
40.0000 mg | DELAYED_RELEASE_TABLET | Freq: Every day | ORAL | Status: DC
  Administered 2014-08-02 – 2014-08-05 (×4): 40 mg via ORAL
  Filled 2014-08-01 (×4): qty 1

## 2014-08-01 MED ORDER — MORPHINE SULFATE 2 MG/ML IJ SOLN: 2.0000 mg | INTRAMUSCULAR | Status: DC | PRN

## 2014-08-01 MED ORDER — DILTIAZEM HCL ER COATED BEADS 120 MG PO CP24
120.0000 mg | ORAL_CAPSULE | Freq: Every day | ORAL | Status: DC
  Administered 2014-08-01: 120 mg via ORAL
  Filled 2014-08-01 (×2): qty 1

## 2014-08-01 MED ORDER — IPRATROPIUM BROMIDE 0.02 % IN SOLN
0.5000 mg | Freq: Once | RESPIRATORY_TRACT | Status: DC
  Filled 2014-08-01: qty 2.5

## 2014-08-01 MED ORDER — PREDNISONE 20 MG PO TABS
40.0000 mg | ORAL_TABLET | Freq: Once | ORAL | Status: AC
  Administered 2014-08-01: 40 mg via ORAL
  Filled 2014-08-01: qty 2

## 2014-08-01 MED ORDER — LEVALBUTEROL HCL 0.63 MG/3ML IN NEBU
0.6300 mg | INHALATION_SOLUTION | Freq: Four times a day (QID) | RESPIRATORY_TRACT | Status: DC
  Administered 2014-08-01: 0.63 mg via RESPIRATORY_TRACT
  Filled 2014-08-01: qty 3

## 2014-08-01 MED ORDER — ONDANSETRON HCL 4 MG/2ML IJ SOLN: 4.0000 mg | Freq: Four times a day (QID) | INTRAMUSCULAR | Status: DC | PRN

## 2014-08-01 NOTE — ED Notes (Signed)
Pt placed into gown and on monitor upon arrival to rom. Pt monitored by blood pressure, pulse ox, and 5 lead.

## 2014-08-01 NOTE — ED Notes (Signed)
Pt refused to allow staff to increase oxygen, sats 87 % and Katelyn PA aware

## 2014-08-01 NOTE — ED Notes (Addendum)
Pt c/o increased shortness of breath ever since she fell of her bed 07/17/2014. Pt on Home O2. Pulse ox 87% on 4L via Spur. Pt talking in complete sentences without difficulty. Pt reports that her normal O2 level is around 90.

## 2014-08-01 NOTE — Progress Notes (Signed)
Pt is sitting in bed on 3 LPM nasal cannula. States this is what she is on and that she does not want anyone to increase her nasal cannula. She can speak full sentences. BBS clear; diminished. RN at bedside. RT will continue to monitor.

## 2014-08-01 NOTE — ED Notes (Signed)
Per note in chart and charge RN O2 put back down on 3L.

## 2014-08-01 NOTE — Progress Notes (Signed)
ANTIBIOTIC CONSULT NOTE - INITIAL  Pharmacy Consult:  Cefepime Indication:  Exacerbation of bronchiectasis  Allergies  Allergen Reactions  . Albuterol Anaphylaxis    Makes heart race  . Daliresp [Roflumilast] Other (See Comments)    Mental changes, feeling weird  . Doxycycline Other (See Comments)    Bad taste in mouth, "salty coating"  . Levaquin [Levofloxacin] Palpitations and Other (See Comments)    Joint pains/aches  . Other Other (See Comments)    DAIRY PRODUCTS-causes thick mucous secretions  . Sulfonamide Derivatives Nausea Only    Patient Measurements: Height: 5\' 7"  (170.2 cm) Weight: 115 lb (52.164 kg) IBW/kg (Calculated) : 61.6  Vital Signs: Temp: 97.4 F (36.3 C) (12/12 1530) Temp Source: Axillary (12/12 1530) BP: 136/66 mmHg (12/12 1900) Pulse Rate: 115 (12/12 1900)  Labs:  Recent Labs  08/01/14 1400  WBC 9.4  HGB 13.2  PLT 311  CREATININE 0.32*   Estimated Creatinine Clearance: 50.1 mL/min (by C-G formula based on Cr of 0.32). No results for input(s): VANCOTROUGH, VANCOPEAK, VANCORANDOM, GENTTROUGH, GENTPEAK, GENTRANDOM, TOBRATROUGH, TOBRAPEAK, TOBRARND, AMIKACINPEAK, AMIKACINTROU, AMIKACIN in the last 72 hours.   Microbiology: No results found for this or any previous visit (from the past 720 hour(s)).  Medical History: Past Medical History  Diagnosis Date  . 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 COPD (chronic obstructive pulmonary disease)   . Bronchiectasis   . Hypercalcemia   . GERD (gastroesophageal reflux disease)   . Hypertension   . Asthma     hx of PNA  . Adult failure to thrive 08/06/2012    20 pound weight loss one year, anorexia, pernicious anemia   . 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 CXR 01/23/12- again shows essentially stable severe fibro--cystic scarring with bronchiectasis and air fluid levels, but no obvious progression.  Desat to 86% with exertion walking room air- order portable O2   . GERD   . Pernicious anemia      Assessment: [age] YOM with history of COPD and treated Mycobacterium infection presented with SOB.  Pharmacy consulted to initiate Cefepime for exacerbation of bronchiectasis.  Baseline labs reviewed.   Goal of Therapy:  Resolution of COPD exacerbation   Plan:  - Cefepime 1gm IV Q8H - Continue azithromycin 500mg  IV Q24H per MD - Monitor renal fxn, clinical progress to de-escalate abx    Sharada Albornoz D. Laney Potashang, PharmD, BCPS Pager:  [phone] 08/01/2014, 7:56 PM

## 2014-08-01 NOTE — ED Provider Notes (Signed)
CSN: [id number]     Arrival date & time 08/01/14  1311 History   First MD Initiated Contact with Patient 08/01/14 1351     Chief Complaint  Patient presents with  . Shortness of Breath     (Consider location/radiation/quality/duration/timing/severity/associated sxs/prior Treatment) HPI Comments: Patient is a 73 year old female with a past medical history of COPD, GERD, hypertension and pernicious anemia who presents with shortness of breath that started 2 weeks ago when she rolled out of bed and landed on her right side. Symptoms progressively worsened and today she became intolerant of the SOB. Patient wears home oxygen at 4L nasal cannula. She reports associated right lateral chest pain that is worse with palpation and movement. She denies any fever or other associated symptoms.    Past Medical History  Diagnosis Date  . 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 COPD (chronic obstructive pulmonary disease)   . Bronchiectasis   . Hypercalcemia   . GERD (gastroesophageal reflux disease)   . Hypertension   . Asthma     hx of PNA  . Adult failure to thrive 08/06/2012    20 pound weight loss one year, anorexia, pernicious anemia   . 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 CXR 01/23/12- again shows essentially stable severe fibro--cystic scarring with bronchiectasis and air fluid levels, but no obvious progression. Desat to 86% with exertion walking room air- order portable O2   . GERD   . Pernicious anemia    Past Surgical History  Procedure Laterality Date  . Tonsillectomy    . Lung biopsy     Family History  Problem Relation Age of Onset  . Hypertension    . Stroke Father   . Alzheimer's disease Mother   . Atrial fibrillation Mother   . Seizures Son    History  Substance Use Topics  . Smoking status: Never Smoker   . Smokeless tobacco: Never Used  . Alcohol Use: No   OB  History    No data available     Review of Systems  Constitutional: Negative for fever, chills and fatigue.  HENT: Negative for trouble swallowing.   Eyes: Negative for visual disturbance.  Respiratory: Positive for shortness of breath.   Cardiovascular: Positive for chest pain. Negative for palpitations.  Gastrointestinal: Negative for nausea, vomiting, abdominal pain and diarrhea.  Genitourinary: Negative for dysuria and difficulty urinating.  Musculoskeletal: Negative for arthralgias and neck pain.  Skin: Negative for color change.  Neurological: Negative for dizziness and weakness.  Psychiatric/Behavioral: Negative for dysphoric mood.      Allergies  Daliresp; Doxycycline; Levaquin; Other; and Sulfonamide derivatives  Home Medications   Prior to Admission medications   Medication Sig Start Date End Date Taking? Authorizing Provider  calcium carbonate (TUMS - DOSED IN MG ELEMENTAL CALCIUM) 500 MG chewable tablet Chew 1 tablet by mouth daily as needed for indigestion or heartburn.    Historical Provider, MD  cyanocobalamin (,VITAMIN B-12,) 1000 MCG/ML injection Inject 1 mL (1,000 mcg total) into the muscle every 30 (thirty) days. 11/13/12   Newt Lukes, MD  diltiazem (CARDIZEM CD) 120 MG 24 hr capsule Take 1 capsule (120 mg total) by mouth daily. 02/04/14   Newt Lukes, MD  furosemide (LASIX) 20 MG tablet Take 20 mg by mouth as needed (swelling). 05/23/14   Esperanza Sheets, MD  ipratropium (ATROVENT) 0.02 % nebulizer solution Take 0.5 mg  by nebulization 3 (three) times daily. Pt uses prn 01/15/14   Marinda ElkAbraham Feliz Ortiz, MD  levalbuterol Pauline Aus(XOPENEX) 0.63 MG/3ML nebulizer solution Take 3 mLs (0.63 mg total) by nebulization every 3 (three) hours as needed for wheezing or shortness of breath. 01/15/14   Marinda ElkAbraham Feliz Ortiz, MD  Polyethyl Glycol-Propyl Glycol (SYSTANE) 0.4-0.3 % SOLN Place 1 drop into both eyes daily as needed (for dry eyes).     Historical Provider, MD   potassium chloride (K-DUR) 10 MEQ tablet Take 10 mEq by mouth as needed (takes only with lasix). 05/23/14   Esperanza SheetsUlugbek N Buriev, MD  traMADol (ULTRAM) 50 MG tablet 1/2-1 q 8 hrs prn 06/22/14   Pecola LawlessWilliam F Hopper, MD   BP 127/60 mmHg  Pulse 104  Temp(Src)   Resp 29  Ht 5\' 7"  (1.702 m)  Wt 115 lb (52.164 kg)  BMI 18.01 kg/m2  SpO2 89% Physical Exam  Constitutional: She is oriented to person, place, and time. She appears well-developed and well-nourished. No distress.  HENT:  Head: Normocephalic and atraumatic.  Eyes: Conjunctivae are normal.  Neck: Normal range of motion.  Cardiovascular: Regular rhythm.  Exam reveals no gallop and no friction rub.   No murmur heard. tachycardic  Pulmonary/Chest: Effort normal. She has wheezes. She has no rales. She exhibits tenderness.  Inspiratory and expiratory wheezing and crackles noted in all lung fields. Patient currently on 4L oxygen nasal cannula.   Right lower lateral and posterior chest tenderness to palpation. No obvious deformity or bruising.   Abdominal: Soft. She exhibits no distension. There is no tenderness. There is no rebound.  Musculoskeletal: Normal range of motion.  Neurological: She is alert and oriented to person, place, and time. Coordination normal.  Speech is goal-oriented. Moves limbs without ataxia.   Skin: Skin is warm and dry.  Psychiatric: She has a normal mood and affect. Her behavior is normal.  Nursing note and vitals reviewed.   ED Course  Procedures (including critical care time) Labs Review Labs Reviewed  BASIC METABOLIC PANEL - Abnormal; Notable for the following:    Chloride 94 (*)    CO2 38 (*)    Creatinine, Ser 0.32 (*)    All other components within normal limits  CBC  I-STAT TROPOININ, ED  Rosezena SensorI-STAT TROPOININ, ED    Imaging Review Dg Ribs Unilateral W/chest Right  08/01/2014   CLINICAL DATA:  Severe shortness of breath with constant cough. Unable to do some of the positions for imaging. History of  COPD.  EXAM: RIGHT RIBS AND CHEST - 3+ VIEW  COMPARISON:  09/16/2013  FINDINGS: Positioning is nonstandard. Multiple oblique views demonstrate no acute rib fractures. Numerous cystic cavities are identified throughout the lungs, unchanged since prior study. Small air-fluid levels are identified in multiple spaces. The heart size is probably normal. No definite pleural effusions.  IMPRESSION: 1. No evidence for acute fracture. 2. Numerous cystic spaces within the lungs, stable in appearance.   Electronically Signed   By: Rosalie GumsBeth  Brown M.D.   On: 08/01/2014 15:10     EKG Interpretation   Date/Time:  Saturday August 01 2014 13:24:15 EST Ventricular Rate:  103 PR Interval:  136 QRS Duration: 82 QT Interval:  334 QTC Calculation: 437 R Axis:   -161 Text Interpretation:  Sinus tachycardia Right superior axis deviation  Anterior infarct , age undetermined Abnormal ECG UNCHANGED FROM PRIOR  Confirmed by Rhunette CroftNANAVATI, MD, Janey GentaANKIT [phone]([id number]) on 08/01/2014 3:27:51 PM      MDM   Final diagnoses:  Fall  SOB (shortness of breath)  Hypoxia    2:31 PM Labs, troponin, and EKG pending. Chest xray pending. Patient is tachycardic and tachypneic.   3:53 PM Labs and troponin unremarkable for acute changes. Labs show low chloride at 94 and elevated CO2 at 38. Chest xray shows no acute changes.   4:12 PM Patient's lung sounds improved. Patient will be admitted for hypoxia.  Emilia BeckKaitlyn Layken Beg, PA-C 08/03/14 [id number]  Derwood KaplanAnkit Nanavati, MD 08/05/14 2141

## 2014-08-01 NOTE — H&P (Signed)
[facility] History and Physical  Stacy RheaJean B Bannan ZOX:[id number] DOB: 08/28/1940 DOA: 08/01/2014  Referring physician:  PCP: Rene PaciValerie Leschber, MD   Chief Complaint: Shortness of breath  HPI: Stacy Novak is a 73 y.o. female with a past medical history of bronchiectasis/COPD, history of treated Mycobacterium infection Who is being followed by Dr. Maple HudsonYoung of pulmonary medicine and Dr. Ninetta LightsHatcher of infectious disease, chronic hypoxemic respiratory failure requiring 3 L supplemental oxygen at baseline,protein calorie malnutrition, Failure to thrive, Presenting to the emergency department with worsening shortness of breath. She states symptoms becoming worse over the past 2 weeks having increasing shortness of breath/work of breathing, Cough with yellow sputum production, generalized weakness, poor tolerance to physical exertion, anorexia, malaise, overall failure to thrive.  She has complains of subjective fevers and chills and reports sick contact from her husband who "came down with the cold" several weeks ago. In the emergency department she was found to be in acute hypoxemic respiratory failure. Chest x-ray showed numerous cystic spaces within the lungs that were felt to be stable in appearance by radiology.                                                                                                   Review of Systems:  Constitutional:  No weight loss, night sweats, positive for  Fevers, chills, fatigue.  HEENT:  No headaches, Difficulty swallowing,Tooth/dental problems,Sore throat,  No sneezing, itching, ear ache, nasal congestion, post nasal drip,  Cardio-vascular:  No chest pain, Orthopnea, PND, swelling in lower extremities, anasarca, dizziness, palpitations  GI:  No heartburn, indigestion, abdominal pain, nausea, vomiting, diarrhea, change in bowel habits, loss of appetite  Resp:  Positive for shortness of breath with exertion or at rest. No excess mucus, no productive cough, No  non-productive cough, No coughing up of blood.No change in color of mucus.No wheezing.No chest wall deformity  Skin:  no rash or lesions.  GU:  no dysuria, change in color of urine, no urgency or frequency. No flank pain.  Musculoskeletal:  No joint pain or swelling. No decreased range of motion. No back pain.  Psych:  No change in mood or affect. No depression or anxiety. No memory loss.   Past Medical History  Diagnosis Date  . 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 COPD (chronic obstructive pulmonary disease)   . Bronchiectasis   . Hypercalcemia   . GERD (gastroesophageal reflux disease)   . Hypertension   . Asthma     hx of PNA  . Adult failure to thrive 08/06/2012    20 pound weight loss one year, anorexia, pernicious anemia   . 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 CXR 01/23/12- again shows essentially stable severe fibro--cystic scarring with bronchiectasis and air fluid levels, but no obvious progression. Desat to 86% with exertion walking room air- order portable O2   . GERD   . Pernicious anemia    Past Surgical History  Procedure Laterality Date  . Tonsillectomy    . Lung biopsy  Social History:  reports that she has never smoked. She has never used smokeless tobacco. She reports that she does not drink alcohol or use illicit drugs.  Allergies  Allergen Reactions  . Albuterol Anaphylaxis    Makes heart race  . Daliresp [Roflumilast] Other (See Comments)    Mental changes, feeling weird  . Doxycycline Other (See Comments)    Bad taste in mouth, "salty coating"  . Levaquin [Levofloxacin] Palpitations and Other (See Comments)    Joint pains/aches  . Other Other (See Comments)    DAIRY PRODUCTS-causes thick mucous secretions  . Sulfonamide Derivatives Nausea Only    Family History  Problem Relation Age of Onset  . Hypertension    . Stroke Father   .  Alzheimer's disease Mother   . Atrial fibrillation Mother   . Seizures Son      Prior to Admission medications   Medication Sig Start Date End Date Taking? Authorizing Provider  acetaminophen (TYLENOL) 500 MG tablet Take 500 mg by mouth every 6 (six) hours as needed for mild pain or moderate pain.   Yes Historical Provider, MD  calcium carbonate (TUMS - DOSED IN MG ELEMENTAL CALCIUM) 500 MG chewable tablet Chew 1 tablet by mouth daily as needed for indigestion or heartburn.   Yes Historical Provider, MD  cyanocobalamin (,VITAMIN B-12,) 1000 MCG/ML injection Inject 1 mL (1,000 mcg total) into the muscle every 30 (thirty) days. 11/13/12  Yes Newt Lukes, MD  diltiazem (CARDIZEM CD) 120 MG 24 hr capsule Take 1 capsule (120 mg total) by mouth daily. 02/04/14  Yes Newt Lukes, MD  furosemide (LASIX) 20 MG tablet Take 20 mg by mouth as needed (swelling). 05/23/14  Yes Esperanza Sheets, MD  ipratropium (ATROVENT) 0.02 % nebulizer solution Take 0.5 mg by nebulization 3 (three) times daily. Pt uses prn 01/15/14  Yes Marinda Elk, MD  levalbuterol Pauline Aus) 0.63 MG/3ML nebulizer solution Take 3 mLs (0.63 mg total) by nebulization every 3 (three) hours as needed for wheezing or shortness of breath. 01/15/14  Yes Marinda Elk, MD  Polyethyl Glycol-Propyl Glycol (SYSTANE) 0.4-0.3 % SOLN Place 1 drop into both eyes daily as needed (for dry eyes).    Yes Historical Provider, MD  potassium chloride (K-DUR) 10 MEQ tablet Take 10 mEq by mouth as needed (takes only with lasix). 05/23/14  Yes Esperanza Sheets, MD  traMADol (ULTRAM) 50 MG tablet 1/2-1 q 8 hrs prn 06/22/14   Pecola Lawless, MD   Physical Exam: Filed Vitals:   08/01/14 1530 08/01/14 1543 08/01/14 1556 08/01/14 1701  BP:  127/62 143/70 145/66  Pulse:  114 115 114  Temp: 97.4 F (36.3 C)     TempSrc: Axillary     Resp:  37 19 35  Height:      Weight:      SpO2:  90%  82%    Wt Readings from Last 3 Encounters:  08/01/14  52.164 kg (115 lb)  06/04/14 53.706 kg (118 lb 6.4 oz)  05/27/14 54.318 kg (119 lb 12 oz)    General:  Cachectic, chronically ill-appearing,  dyspneic  Eyes: PERRL, normal lids, irises & conjunctiva ENT: grossly normal hearing, lips & tongue Neck: no LAD, masses or thyromegaly Cardiovascular: tachycardic RRR, no m/r/g. No LE edema. Telemetry: SR, no arrhythmias,  sinus tachycardia  Respiratory: diminished breath sounds bilaterally. She has bilateral crackles, expiratory wheezes, rhonchi throughout. Appears dyspneic at rest  Abdomen: soft, ntnd Skin: no rash or induration seen  on limited exam Musculoskeletal significant bilateral muscle atrophy  Neurologic: grossly non-focal.          Labs on Admission:  Basic Metabolic Panel:  Recent Labs Lab 08/01/14 1400  NA 141  K 4.3  CL 94*  CO2 38*  GLUCOSE 78  BUN 18  CREATININE 0.32*  CALCIUM 9.2   Liver Function Tests: No results for input(s): AST, ALT, ALKPHOS, BILITOT, PROT, ALBUMIN in the last 168 hours. No results for input(s): LIPASE, AMYLASE in the last 168 hours. No results for input(s): AMMONIA in the last 168 hours. CBC:  Recent Labs Lab 08/01/14 1400  WBC 9.4  HGB 13.2  HCT 44.0  MCV 90.5  PLT 311   Cardiac Enzymes: No results for input(s): CKTOTAL, CKMB, CKMBINDEX, TROPONINI in the last 168 hours.  BNP (last 3 results)  Recent Labs  01/06/14 1746 01/12/14 0525 05/17/14 1936  PROBNP 1290.0* 528.2* 832.1*   CBG: No results for input(s): GLUCAP in the last 168 hours.  Radiological Exams on Admission: Dg Ribs Unilateral W/chest Right  08/01/2014   CLINICAL DATA:  Severe shortness of breath with constant cough. Unable to do some of the positions for imaging. History of COPD.  EXAM: RIGHT RIBS AND CHEST - 3+ VIEW  COMPARISON:  09/16/2013  FINDINGS: Positioning is nonstandard. Multiple oblique views demonstrate no acute rib fractures. Numerous cystic cavities are identified throughout the lungs,  unchanged since prior study. Small air-fluid levels are identified in multiple spaces. The heart size is probably normal. No definite pleural effusions.  IMPRESSION: 1. No evidence for acute fracture. 2. Numerous cystic spaces within the lungs, stable in appearance.   Electronically Signed   By: Rosalie GumsBeth  Brown M.D.   On: 08/01/2014 15:10    EKG: Independently reviewed. sinus tachycardia   Assessment/Plan Principal Problem:   Acute on chronic respiratory failure Active Problems:   Bronchiectasis with acute exacerbation   COPD exacerbation   GERD   HTN (hypertension)   Protein-calorie malnutrition, severe   Hypoxia   1. Acute on chronic hypoxemic hypercarbic respiratory failure. Evidence by patient's presentation in respiratory distress, having a respiratory rate of 37, oxygen saturations of 82% in the emergency department,  ABG showing a PCO2 of 62.7. Likely secondary to acute exacerbation of bronchiectasis and possibly COPD exacerbation. Will start Solu-Medrol 60 mg IV every 6 hours, empiric IV antimicrobial therapy with cefepime and azithromycin, scheduled nebulizer treatments with Xopenex and Atrovent, provide supplemental oxygen, admit patient to the step down unit for close monitoring. 2.  Bronchiectasis with probable acute exacerbation. Patient having a history of severe cystic bronchiectasis in setting of [facility], who sees Dr. Maple HudsonYoung of pulmonary medicine in the outpatient setting. Will initiate broad-spectrum IV antimicrobial therapy with cefepime and azithromycin, systemic steroids with Solu-Medrol 60 mg IV every 6 hours, supplemental oxygen, admit patient to step down unit. Will check sputum cultures and blood cultures.  3. Likely superimposed COPD exacerbation. Patient having FEV1 of 49% predicted along with restrictive disease based on pulmonary function tests from 2012. As outlined above starting systemic steroids, IV antibiotics, nebulizer treatments.  4.  Hypertension. Continue Cardizem  120 mg by mouth daily  5.  Severe protein calorie malnutrition. Provide protein boost 3 times a day 6. DVT prophylaxis Lovenox   Code Status: Full code Family Communication: spoke with her husband and son who were present at bedside Disposition Plan: Anticipate she'll require greater than 2 nights hospitalization  Time spent: 70 min  Jeralyn BennettZAMORA, Joneisha Miles [facility] Pager [phone]

## 2014-08-01 NOTE — ED Notes (Signed)
The pt given prednisone.  Pt nit happy about the prednisone  She reports that it makes her glucose high.  She is also unhappy about going to the stepdown unit.  C/o that she has been breathing like this for one week.  Ok.  She wants to know her abg results but she does not understand what the results mean

## 2014-08-01 NOTE — ED Notes (Signed)
The pt reportd that she feels about the same as when she arrived.  She reports no pain she just does not feel well

## 2014-08-01 NOTE — ED Notes (Signed)
Pt placed on monitor upon return to room from radiology. Pt remains monitored by blood pressure, pulse ox, and 5 lead. pts family remains at bedside.  

## 2014-08-01 NOTE — ED Notes (Signed)
Report attempted  Unable to give

## 2014-08-01 NOTE — ED Notes (Signed)
Pt remains monitored by blood pressure, pulse ox, and 5 lead. Pts family remains at bedside.  

## 2014-08-01 NOTE — Progress Notes (Signed)
Critical ABG results shown to MD. MD aware of pt not wanting nasal cannula increased from the 3 LPM she is currently at.

## 2014-08-01 NOTE — ED Notes (Signed)
Pt sating at 80-83 with 36RR.  Pt in tripod position.  O2 increased from 3L to 4L. Pt requesting not going higher than 4L, worried about not being able to go back to 3L.

## 2014-08-01 NOTE — ED Notes (Signed)
Family at bedside. 

## 2014-08-01 NOTE — ED Notes (Signed)
Pt requesting food   Given.  The pt wiill not allow an oral temp  Only an axillary one

## 2014-08-01 NOTE — ED Notes (Signed)
Attempted report 

## 2014-08-01 NOTE — ED Notes (Signed)
Pt in xray

## 2014-08-02 LAB — URINALYSIS, ROUTINE W REFLEX MICROSCOPIC
Bilirubin Urine: NEGATIVE
HGB URINE DIPSTICK: NEGATIVE
KETONES UR: NEGATIVE mg/dL
Leukocytes, UA: NEGATIVE
Nitrite: NEGATIVE
PROTEIN: NEGATIVE mg/dL
Specific Gravity, Urine: 1.019 (ref 1.005–1.030)
UROBILINOGEN UA: 0.2 mg/dL (ref 0.0–1.0)
pH: 6 (ref 5.0–8.0)

## 2014-08-02 LAB — CBC
HEMATOCRIT: 43.2 % (ref 36.0–46.0)
Hemoglobin: 12.8 g/dL (ref 12.0–15.0)
MCH: 26.9 pg (ref 26.0–34.0)
MCHC: 29.6 g/dL — AB (ref 30.0–36.0)
MCV: 90.8 fL (ref 78.0–100.0)
PLATELETS: 324 10*3/uL (ref 150–400)
RBC: 4.76 MIL/uL (ref 3.87–5.11)
RDW: 12.8 % (ref 11.5–15.5)
WBC: 5.4 10*3/uL (ref 4.0–10.5)

## 2014-08-02 LAB — BASIC METABOLIC PANEL
Anion gap: 6 (ref 5–15)
BUN: 18 mg/dL (ref 6–23)
CALCIUM: 9.1 mg/dL (ref 8.4–10.5)
CO2: 36 meq/L — AB (ref 19–32)
CREATININE: 0.37 mg/dL — AB (ref 0.50–1.10)
Chloride: 95 mEq/L — ABNORMAL LOW (ref 96–112)
GFR calc Af Amer: >90 mL/min (ref >90)
Glucose, Bld: 120 mg/dL — ABNORMAL HIGH (ref 70–99)
Potassium: 4.6 mEq/L (ref 3.7–5.3)
Sodium: 137 mEq/L (ref 137–147)

## 2014-08-02 LAB — INFLUENZA PANEL BY PCR (TYPE A & B)
H1N1 flu by pcr: NOT DETECTED
Influenza A By PCR: NEGATIVE
Influenza B By PCR: NEGATIVE

## 2014-08-02 LAB — URINE MICROSCOPIC-ADD ON

## 2014-08-02 MED ORDER — METHYLPREDNISOLONE SODIUM SUCC 125 MG IJ SOLR
60.0000 mg | Freq: Two times a day (BID) | INTRAMUSCULAR | Status: DC
  Administered 2014-08-02 – 2014-08-04 (×4): 60 mg via INTRAVENOUS
  Filled 2014-08-02 (×6): qty 0.96

## 2014-08-02 MED ORDER — LEVALBUTEROL HCL 0.63 MG/3ML IN NEBU: 0.6300 mg | INHALATION_SOLUTION | Freq: Four times a day (QID) | RESPIRATORY_TRACT | Status: DC | PRN

## 2014-08-02 MED ORDER — LEVALBUTEROL HCL 0.63 MG/3ML IN NEBU: 0.6300 mg | INHALATION_SOLUTION | RESPIRATORY_TRACT | Status: DC | PRN

## 2014-08-02 MED ORDER — LEVALBUTEROL HCL 0.63 MG/3ML IN NEBU
0.6300 mg | INHALATION_SOLUTION | Freq: Three times a day (TID) | RESPIRATORY_TRACT | Status: DC
  Administered 2014-08-02 – 2014-08-05 (×11): 0.63 mg via RESPIRATORY_TRACT
  Filled 2014-08-02 (×23): qty 3

## 2014-08-02 MED ORDER — DILTIAZEM HCL ER COATED BEADS 120 MG PO CP24
120.0000 mg | ORAL_CAPSULE | Freq: Every day | ORAL | Status: DC
  Administered 2014-08-02 – 2014-08-04 (×3): 120 mg via ORAL
  Filled 2014-08-02 (×4): qty 1

## 2014-08-02 MED ORDER — CETYLPYRIDINIUM CHLORIDE 0.05 % MT LIQD: 7.0000 mL | Freq: Two times a day (BID) | OROMUCOSAL | Status: DC

## 2014-08-02 MED ORDER — DILTIAZEM HCL ER COATED BEADS 120 MG PO CP24: 120.0000 mg | ORAL_CAPSULE | Freq: Every day | ORAL | Status: DC

## 2014-08-02 MED ORDER — IPRATROPIUM BROMIDE 0.02 % IN SOLN
0.5000 mg | Freq: Three times a day (TID) | RESPIRATORY_TRACT | Status: DC
  Administered 2014-08-02 – 2014-08-05 (×11): 0.5 mg via RESPIRATORY_TRACT
  Filled 2014-08-02 (×11): qty 2.5

## 2014-08-02 NOTE — Progress Notes (Signed)
[place] TEAM 1 - Stepdown/ICU TEAM Progress Note  Stacy Novak WUJ:[id number] DOB: 02/13/1941 DOA: 08/01/2014 PCP: Rene PaciValerie Leschber, MD  Admit HPI / Brief Narrative: 73 y.o. female with a history of bronchiectasis/COPD and a treated Mycobacterium infection who is being followed by Dr. Maple HudsonYoung of Pulmonary Medicine and Dr. Ninetta LightsHatcher of Infectious Disease, chronic hypoxemic respiratory failure requiring 3 L supplemental oxygen at baseline, and protein calorie malnutrition who presented to the emergency department with worsening shortness of breath. She stated her symptoms become worse over 2 weeks w/ increasing shortness of breath/work of breathing, cough with yellow sputum production, generalized weakness, poor tolerance to physical exertion, anorexia, malaise, and overall failure to thrive. She had complaints of subjective fevers and chills and reported sick contact from her husband who "came down with the cold" several weeks ago.   In the emergency department she was found to be in acute hypoxemic respiratory failure. Chest x-ray showed numerous cystic spaces within the lungs that were felt to be stable in appearance by radiology.  HPI/Subjective: Pt feels that her breathing has stabilized somewhat.  She denies cp, n/v, or abdom pain.  She relates intermittent cough, and severe generalized weakness.    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 with probable acute exacerbation / acute bronchospastic COPD exacerbation Appears to be calming down a bit for now - pt has been unwilling to consider goals of care discussions in the past and had been insistent on remaining full code - will discuss further during this stay as able -  cont short course of steroids, abx, and neb txs  HTN BP currently well controlled  Severe protein calorie malnutrition / Adult FTT / pulmonary cachexia Pt is determined to improve her strength and functional status - not sure this will be possible as this is likely due to end stage lung disease - will attempt PT/OT as discuss ongoing rehab options as available - Wgt April 2011 66kg   Code Status: FULL Family Communication: spoke w/ husband at bedside  Disposition Plan: SDU  Consultants: none  Procedures: none  Antibiotics: azithro 12/12 > cefepime 12/12 >  DVT prophylaxis: lovenox  Objective: Blood pressure 101/53, pulse 91, temperature 98.1 F (36.7 C), temperature source Oral, resp. rate 30, height 5\' 6"  (1.676 m), weight 52.4 kg (115 lb 8.3 oz), SpO2 96 %.  Intake/Output Summary (Last 24 hours) at 08/02/14 [id number] Last data filed at 08/02/14 0300  Gross per 24 hour  Intake    540 ml  Output    440 ml  Net    100 ml   Exam: General: No acute respiratory distress evident at time of exam - able to complete full sentences w/o difficulty Lungs: very poor air movement th/o all fields - no active wheeze at this time - diffuse fine crackles  Cardiovascular: Regular rate and rhythm - distant HS Abdomen: Nontender, nondistended, soft, bowel sounds positive, no rebound, no ascites, no appreciable mass Extremities: No significant cyanosis, clubbing, or edema bilateral lower extremities - cachectic   Data Reviewed: Basic Metabolic Panel:  Recent Labs Lab 08/01/14 1400 08/02/14 0315  NA 141 137  K 4.3 4.6  CL 94* 95*  CO2 38* 36*  GLUCOSE 78 120*  BUN 18 18  CREATININE 0.32* 0.37*  CALCIUM 9.2 9.1    Liver Function  Tests: No results for input(s): AST, ALT, ALKPHOS, BILITOT, PROT, ALBUMIN in the last 168 hours. No results for input(s): LIPASE, AMYLASE in the last 168 hours. No results for input(s): AMMONIA in the last 168 hours.   CBC:  Recent Labs Lab  08/01/14 1400 08/02/14 0315  WBC 9.4 5.4  HGB 13.2 12.8  HCT 44.0 43.2  MCV 90.5 90.8  PLT 311 324   CBG: No results for input(s): GLUCAP in the last 168 hours.  Recent Results (from the past 240 hour(s))  MRSA PCR Screening     Status: None   Collection Time: 08/01/14  8:00 PM  Result Value Ref Range Status   MRSA by PCR NEGATIVE NEGATIVE Final    Comment:        The GeneXpert MRSA Assay (FDA approved for NASAL specimens only), is one component of a comprehensive MRSA colonization surveillance program. It is not intended to diagnose MRSA infection nor to guide or monitor treatment for MRSA infections.      Studies:  Recent x-ray studies have been reviewed in detail by the Attending Physician  Scheduled Meds:  Scheduled Meds: . antiseptic oral rinse  7 mL Mouth Rinse BID  . azithromycin  500 mg Intravenous Q24H  . ceFEPime (MAXIPIME) IV  1 g Intravenous 3 times per day  . diltiazem  120 mg Oral Daily  . enoxaparin (LOVENOX) injection  40 mg Subcutaneous Q24H  . feeding supplement (ENSURE COMPLETE)  237 mL Oral TID WC  . ipratropium  0.5 mg Nebulization TID  . levalbuterol  0.63 mg Nebulization TID  . methylPREDNISolone (SOLU-MEDROL) injection  60 mg Intravenous Q6H  . pantoprazole  40 mg Oral Daily  . sodium chloride  3 mL Intravenous Q12H    Time spent on care of this patient: 35 mins   MCCLUNG,JEFFREY T , MD   [facility] Office  [phone] Pager - Text Page per Loretha StaplerAmion as per below:  On-Call/Text Page:      [email or web address]      password [email or web address]  If 7PM-7AM, please contact night-coverage [email or web address] Password [email or web address] 08/02/2014, 9:07 AM   LOS: 1 day

## 2014-08-02 NOTE — Progress Notes (Addendum)
Pt received from ED at 1945. Assisted to bedside commode and back to bed. Oxygen saturation normalizing; other vitals stable. Pt denies pain. Family at bedside, and all belongings sent home. Pt oriented to unit and instructed in use of call light. Bed alarm in place.

## 2014-08-03 LAB — COMPREHENSIVE METABOLIC PANEL
ALBUMIN: 2.6 g/dL — AB (ref 3.5–5.2)
ALK PHOS: 128 U/L — AB (ref 39–117)
ALT: 9 U/L (ref 0–35)
ANION GAP: 6 (ref 5–15)
AST: 12 U/L (ref 0–37)
BUN: 17 mg/dL (ref 6–23)
CHLORIDE: 99 meq/L (ref 96–112)
CO2: 35 mEq/L — ABNORMAL HIGH (ref 19–32)
Calcium: 9.1 mg/dL (ref 8.4–10.5)
Creatinine, Ser: 0.3 mg/dL — ABNORMAL LOW (ref 0.50–1.10)
GFR calc Af Amer: >90 mL/min (ref >90)
GFR calc non Af Amer: >90 mL/min (ref >90)
Glucose, Bld: 108 mg/dL — ABNORMAL HIGH (ref 70–99)
Potassium: 4.5 mEq/L (ref 3.7–5.3)
SODIUM: 140 meq/L (ref 137–147)
TOTAL PROTEIN: 6.8 g/dL (ref 6.0–8.3)
Total Bilirubin: <0.2 mg/dL — ABNORMAL LOW (ref 0.3–1.2)

## 2014-08-03 NOTE — Evaluation (Addendum)
Occupational Therapy Evaluation Patient Details Name: Stacy Novak MRN: [id number] DOB: 09/18/1940 Today's Date: 08/03/2014    History of Present Illness pt presents with acute on chronic respiratory failure and hx of COPD and chronic Cystic Bronchiectasis.     Clinical Impression   Pt admitted with above. Pt with decreased strength and activity tolerance. Recommending SNF for rehab prior to d/c home. Feel pt will benefit from acute OT to increase independence, strength, and activity tolerance prior to d/c.    Follow Up Recommendations  SNF    Equipment Recommendations  Other (comment) (long handled sponge)    Recommendations for Other Services       Precautions / Restrictions Precautions Precautions: Fall Precaution Comments: pt on 3 L O2 at baseline.   Restrictions Weight Bearing Restrictions: No      Mobility Bed Mobility Overal bed mobility: Modified Independent             General bed mobility comments: sitting in bed with HOB elevated; and transitioned back to bed with HOB elevated.  Transfers Overall transfer level: Needs assistance Equipment used: Rolling walker (2 wheeled) Transfers: Sit to/from Stand Sit to Stand: Min guard         General transfer comment: cues for hand placement.    Balance                                            ADL Overall ADL's : Needs assistance/impaired                     Lower Body Dressing: Min guard;Sit to/from stand   Toilet Transfer: Min guard;Ambulation;RW;Stand-pivot;BSC           Functional mobility during ADLs: Min guard;Rolling walker General ADL Comments: Educated on safety (sitting for most of LB ADLs, use of bag on walker). Told pt that long sponge may be helpful for UB bathing to help reach back. Explained benefit of therapy and discussed possibly going to rehab.  Educated on deep breathing technique.     Vision                     Perception     Praxis       Pertinent Vitals/Pain Pain Assessment: No/denies pain    *O2 dropped in session in 80's on 3 L of O2. Educated on deep breathing technique and it trended up to 90's.      Hand Dominance Right   Extremity/Trunk Assessment Upper Extremity Assessment Upper Extremity Assessment: Generalized weakness   Lower Extremity Assessment Lower Extremity Assessment: Defer to PT evaluation       Communication Communication Communication: No difficulties   Cognition Arousal/Alertness: Awake/alert Behavior During Therapy: WFL for tasks assessed/performed Overall Cognitive Status: Within Functional Limits for tasks assessed                     General Comments       Exercises       Shoulder Instructions      Home Living Family/patient expects to be discharged to:: Skilled nursing facility (pt unsure if she wants to pursue rehab)                                 Additional Comments: pt states they have been  living in a hotel where it is easier to manage her W/C. Regular height toilet and tub/shower . Pt has BSC      Prior Functioning/Environment Level of Independence: Needs assistance  Gait / Transfers Assistance Needed: Transfers only.  pt states during last admit she had started ambulating again, but hasn't ambulated since D/C.   ADL's / Homemaking Assistance Needed: Sponge bath only. Difficulty getting back clean        OT Diagnosis: Generalized weakness   OT Problem List: Cardiopulmonary status limiting activity;Decreased knowledge of use of DME or AE;Decreased strength;Decreased activity tolerance   OT Treatment/Interventions: Self-care/ADL training;Therapeutic exercise;DME and/or AE instruction;Energy conservation;Therapeutic activities;Patient/family education;Balance training    OT Goals(Current goals can be found in the care plan section) Acute Rehab OT Goals Patient Stated Goal: not be in rehab during Christmas OT Goal Formulation: With  patient Time For Goal Achievement: 08/10/14 Potential to Achieve Goals: Good ADL Goals Pt Will Perform Grooming: standing;with set-up Pt Will Perform Upper Body Bathing: standing;sitting;with set-up Pt Will Perform Lower Body Bathing: sit to/from stand;with set-up Pt Will Transfer to Toilet: ambulating;with supervision Pt Will Perform Toileting - Clothing Manipulation and hygiene: with modified independence;sit to/from stand  OT Frequency: Min 2X/week   Barriers to D/C:            Co-evaluation              End of Session Equipment Utilized During Treatment: Gait belt;Rolling walker;Oxygen  Activity Tolerance: Patient limited by fatigue Patient left: in bed;with call bell/phone within reach   Time: 1658-1710 OT Time Calculation (min): 12 min Charges:  OT General Charges $OT Visit: 1 Procedure OT Evaluation $Initial OT Evaluation Tier I: 1 Procedure G-CodesEarlie Raveling:    Jaymison Luber L OTR/L [phone] 08/03/2014, 5:53 PM

## 2014-08-03 NOTE — Progress Notes (Signed)
[place] TEAM 1 - Stepdown/ICU TEAM Progress Note  Stacy Novak WUJ:[id number] DOB: 09/08/1940 DOA: 08/01/2014 PCP: Rene PaciValerie Leschber, MD  Admit HPI / Brief Narrative: 73 y.o. female with a history of bronchiectasis/COPD and a treated Mycobacterium infection who is being followed by Dr. Maple HudsonYoung of Pulmonary Medicine and Dr. Ninetta LightsHatcher of Infectious Disease, chronic hypoxemic respiratory failure requiring 3 L supplemental oxygen at baseline, and protein calorie malnutrition who presented to the emergency department with worsening shortness of breath. She stated her symptoms become worse over 2 weeks w/ increasing shortness of breath/work of breathing, cough with yellow sputum production, generalized weakness, poor tolerance to physical exertion, anorexia, malaise, and overall failure to thrive. She had complaints of subjective fevers and chills and reported sick contact from her husband who "came down with the cold" several weeks ago.   In the emergency department she was found to be in acute hypoxemic respiratory failure. Chest x-ray showed numerous cystic spaces within the lungs that were felt to be stable in appearance by radiology.  HPI/Subjective: No sob at rest, but becomes very sob w/ slightest exertion.  Still wheezing somewhat.  Denies cp, n/v, or abdom pain.  Appetite is poor.    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 with probable acute exacerbation / acute bronchospastic COPD exacerbation Some wheezing still appreciable, and pt very dyspneic on exertion - pt has been unwilling to consider goals of care discussions in the past and had been insistent on remaining full code - will discuss further during this stay as able - cont short course of  steroids, abx, and neb txs - transfer to med bed   HTN BP currently well controlled  Severe protein calorie malnutrition / Adult FTT / pulmonary cachexia Pt is determined to improve her strength and functional status - not sure this will be possible as this is likely due to end stage lung disease - PT/OT - discuss ongoing rehab options as available - Wgt April 2011 66kg   Code Status: FULL Family Communication: no family present at time of exam today  Disposition Plan: transfer to med bed - CM to investigate options for SNF rehab stay   Consultants: none  Procedures: none  Antibiotics: azithro 12/12 > cefepime 12/12 >  DVT prophylaxis: lovenox  Objective: Blood pressure 128/53, pulse 80, temperature 97.9 F (36.6 C), temperature source Oral, resp. rate 27, height 5\' 6"  (1.676 m), weight 52.4 kg (115 lb 8.3 oz), SpO2 92 %.  Intake/Output Summary (Last 24 hours) at 08/03/14 1400 Last data filed at 08/03/14 0000  Gross per 24 hour  Intake    530 ml  Output    601 ml  Net    -71 ml   Exam: General: No acute respiratory distress evident at time of exam - able to complete full sentences w/o difficulty Lungs: very poor air movement th/o all fields - diffuse mild exp wheeze - diffuse fine crackles  Cardiovascular: Regular rate and rhythm - distant HS Abdomen: Nontender, nondistended, soft, bowel sounds positive, no rebound, no ascites, no appreciable mass Extremities: No significant cyanosis, clubbing, or edema bilateral lower extremities - cachectic   Data Reviewed: Basic Metabolic Panel:  Recent Labs Lab 08/01/14 1400 08/02/14 0315 08/03/14 0248  NA 141 137 140  K 4.3 4.6 4.5  CL 94* 95* 99  CO2 38* 36*  35*  GLUCOSE 78 120* 108*  BUN 18 18 17   CREATININE 0.32* 0.37* 0.30*  CALCIUM 9.2 9.1 9.1    Liver Function Tests:  Recent Labs Lab 08/03/14 0248  AST 12  ALT 9  ALKPHOS 128*  BILITOT <0.2*  PROT 6.8  ALBUMIN 2.6*   CBC:  Recent Labs Lab  08/01/14 1400 08/02/14 0315  WBC 9.4 5.4  HGB 13.2 12.8  HCT 44.0 43.2  MCV 90.5 90.8  PLT 311 324    Recent Results (from the past 240 hour(s))  MRSA PCR Screening     Status: None   Collection Time: 08/01/14  8:00 PM  Result Value Ref Range Status   MRSA by PCR NEGATIVE NEGATIVE Final    Comment:        The GeneXpert MRSA Assay (FDA approved for NASAL specimens only), is one component of a comprehensive MRSA colonization surveillance program. It is not intended to diagnose MRSA infection nor to guide or monitor treatment for MRSA infections.   Culture, blood (routine x 2)     Status: None (Preliminary result)   Collection Time: 08/01/14  9:45 PM  Result Value Ref Range Status   Specimen Description BLOOD RIGHT ANTECUBITAL  Final   Special Requests BOTTLES DRAWN AEROBIC AND ANAEROBIC 10CC EA  Final   Culture  Setup Time   Final    08/02/2014 04:08 Performed at [facility]    Culture   Final           BLOOD CULTURE RECEIVED NO GROWTH TO DATE CULTURE WILL BE HELD FOR 5 DAYS BEFORE ISSUING A FINAL NEGATIVE REPORT Performed at [facility]    Report Status PENDING  Incomplete  Culture, blood (routine x 2)     Status: None (Preliminary result)   Collection Time: 08/01/14 10:00 PM  Result Value Ref Range Status   Specimen Description BLOOD LEFT ANTECUBITAL  Final   Special Requests BOTTLES DRAWN AEROBIC AND ANAEROBIC 10CC EA  Final   Culture  Setup Time   Final    08/02/2014 04:08 Performed at [facility]    Culture   Final           BLOOD CULTURE RECEIVED NO GROWTH TO DATE CULTURE WILL BE HELD FOR 5 DAYS BEFORE ISSUING A FINAL NEGATIVE REPORT Performed at [facility]    Report Status PENDING  Incomplete     Studies:  Recent x-ray studies have been reviewed in detail by the Attending Physician  Scheduled Meds:  Scheduled Meds: . azithromycin  500 mg Intravenous Q24H  . ceFEPime (MAXIPIME) IV  1 g Intravenous 3 times per day   . diltiazem  120 mg Oral QHS  . enoxaparin (LOVENOX) injection  40 mg Subcutaneous Q24H  . feeding supplement (ENSURE COMPLETE)  237 mL Oral TID WC  . ipratropium  0.5 mg Nebulization TID  . levalbuterol  0.63 mg Nebulization TID  . methylPREDNISolone (SOLU-MEDROL) injection  60 mg Intravenous Q12H  . pantoprazole  40 mg Oral Daily  . sodium chloride  3 mL Intravenous Q12H    Time spent on care of this patient: 25 mins   [facility]. [facility]. [facility] Memorial HospitalMCCLUNG,Sherina Stammer T , MD   [facility] Office  [phone] Pager - Text Page per Loretha StaplerAmion as per below:  On-Call/Text Page:      [email or web address]      password [email or web address]  If 7PM-7AM, please contact night-coverage [email or web address] Password [email or web address] 08/03/2014, 2:00 PM   LOS: 2 days

## 2014-08-03 NOTE — Progress Notes (Signed)
Report called to nurse 6 east, all questions answered.  Patient to notify family of room change. No complaints.

## 2014-08-03 NOTE — Evaluation (Signed)
Physical Therapy Evaluation Patient Details Name: Stacy Novak MRN: [id number] DOB: 03/08/1941 Today's Date: 08/03/2014   History of Present Illness  pt rpesents with acute on chronic respiratory failure and hx of COPD and chronic Cystic Bronchiectasis.    Clinical Impression  Pt generally deconditioned and weak.  Pt indicates she had started ambulating during her admit in September, but did not continue ambulating after D/C.  Feel pt would benefit from SNF at D/C for continued rehab and continued nsg care as pt is currently living in a hotel.  Will continue to follow.      Follow Up Recommendations SNF    Equipment Recommendations   (TBD)    Recommendations for Other Services       Precautions / Restrictions Precautions Precautions: Fall Precaution Comments: pt on 3 L O2 at baseline.   Restrictions Weight Bearing Restrictions: No      Mobility  Bed Mobility Overal bed mobility: Needs Assistance Bed Mobility: Supine to Sit;Sit to Supine     Supine to sit: Min guard;HOB elevated Sit to supine: Min guard;HOB elevated   General bed mobility comments: pt has HOB elevated 2/2 does not lay flat due to SOB.    Transfers Overall transfer level: Needs assistance Equipment used: 1 person hand held assist Transfers: Sit to/from Stand Sit to Stand: Min assist         General transfer comment: pt able to come to stand, but only able to maintain standing ~1-692mins.  O2 sats decreased to 88-90% on 3L O2.    Ambulation/Gait                Stairs            Wheelchair Mobility    Modified Rankin (Stroke Patients Only)       Balance Overall balance assessment: Needs assistance Sitting-balance support: No upper extremity supported;Feet supported Sitting balance-Leahy Scale: Fair     Standing balance support: Single extremity supported;During functional activity Standing balance-Leahy Scale: Poor                               Pertinent  Vitals/Pain Pain Assessment: No/denies pain    Home Living Family/patient expects to be discharged to:: Skilled nursing facility                 Additional Comments: pt states they have been living in a hotel where it is easier to manage her W/C.      Prior Function Level of Independence: Needs assistance   Gait / Transfers Assistance Needed: Transfers only.  pt states during last admit she had started ambulating again, but hasn't ambulated since D/C.    ADL's / Homemaking Assistance Needed: Sponge bath only.  Gets A for ADLs.          Hand Dominance   Dominant Hand: Right    Extremity/Trunk Assessment   Upper Extremity Assessment: Defer to OT evaluation           Lower Extremity Assessment: Generalized weakness      Cervical / Trunk Assessment: Kyphotic  Communication   Communication: No difficulties  Cognition Arousal/Alertness: Awake/alert Behavior During Therapy: WFL for tasks assessed/performed Overall Cognitive Status: Within Functional Limits for tasks assessed                      General Comments      Exercises  Assessment/Plan    PT Assessment Patient needs continued PT services  PT Diagnosis Difficulty walking;Generalized weakness   PT Problem List Decreased strength;Decreased activity tolerance;Decreased balance;Decreased mobility;Decreased knowledge of use of DME;Cardiopulmonary status limiting activity  PT Treatment Interventions DME instruction;Gait training;Functional mobility training;Therapeutic activities;Therapeutic exercise;Balance training;Patient/family education   PT Goals (Current goals can be found in the Care Plan section) Acute Rehab PT Goals Patient Stated Goal: I'd like to walk again. PT Goal Formulation: With patient Time For Goal Achievement: 08/17/14 Potential to Achieve Goals: Good    Frequency Min 2X/week   Barriers to discharge Inaccessible home environment;Decreased caregiver support pt lives  with her husband in a hotel, but states she is unable to get into bathroom and that her husband has to leave her alone at times to run errands.      Co-evaluation               End of Session Equipment Utilized During Treatment: Oxygen Activity Tolerance: Patient limited by fatigue Patient left: in bed;with call bell/phone within reach Nurse Communication: Mobility status         Time: [id number] PT Time Calculation (min) (ACUTE ONLY): 25 min   Charges:   PT Evaluation $Initial PT Evaluation Tier I: 1 Procedure PT Treatments $Therapeutic Activity: 8-22 mins   PT G CodesSunny Novak:          Stacy Novak, [place]PT [phone] 08/03/2014, 1:30 PM

## 2014-08-04 DIAGNOSIS — K219 Gastro-esophageal reflux disease without esophagitis: Secondary | ICD-10-CM

## 2014-08-04 MED ORDER — LEVALBUTEROL HCL 0.63 MG/3ML IN NEBU: 0.6300 mg | INHALATION_SOLUTION | RESPIRATORY_TRACT | Status: AC | PRN

## 2014-08-04 MED ORDER — LEVALBUTEROL HCL 0.63 MG/3ML IN NEBU: 0.6300 mg | INHALATION_SOLUTION | Freq: Three times a day (TID) | RESPIRATORY_TRACT | Status: AC

## 2014-08-04 MED ORDER — METHYLPREDNISOLONE SODIUM SUCC 40 MG IJ SOLR
40.0000 mg | Freq: Two times a day (BID) | INTRAMUSCULAR | Status: DC
  Administered 2014-08-04 – 2014-08-05 (×2): 40 mg via INTRAVENOUS
  Filled 2014-08-04 (×4): qty 1

## 2014-08-04 MED ORDER — CETYLPYRIDINIUM CHLORIDE 0.05 % MT LIQD
7.0000 mL | Freq: Two times a day (BID) | OROMUCOSAL | Status: DC
  Administered 2014-08-04 – 2014-08-05 (×3): 7 mL via OROMUCOSAL

## 2014-08-04 MED ORDER — PANTOPRAZOLE SODIUM 40 MG PO TBEC: 40.0000 mg | DELAYED_RELEASE_TABLET | Freq: Every day | ORAL | Status: AC

## 2014-08-04 MED ORDER — AZITHROMYCIN 500 MG PO TABS: 500.0000 mg | ORAL_TABLET | Freq: Every day | ORAL | Status: AC

## 2014-08-04 MED ORDER — TRAMADOL HCL 50 MG PO TABS
50.0000 mg | ORAL_TABLET | Freq: Four times a day (QID) | ORAL | Status: DC | PRN
Start: 2014-08-04 — End: 2014-08-10

## 2014-08-04 MED ORDER — PREDNISONE 5 MG PO TABS: ORAL_TABLET | ORAL | Status: AC

## 2014-08-04 MED ORDER — ENSURE COMPLETE PO LIQD: 237.0000 mL | Freq: Three times a day (TID) | ORAL | Status: DC

## 2014-08-04 MED ORDER — AZITHROMYCIN 500 MG PO TABS
500.0000 mg | ORAL_TABLET | Freq: Every day | ORAL | Status: DC
  Administered 2014-08-05: 500 mg via ORAL
  Filled 2014-08-04 (×3): qty 1

## 2014-08-04 NOTE — Discharge Summary (Addendum)
PATIENT DETAILS Name: Stacy Novak Age: 73 y.o. Sex: female Date of Birth: Jul 04, 1941 MRN: [id number]. Admitting Physician: Jeralyn Bennett, MD NWG:[id number] Felicity Coyer, MD  Admit Date: 08/01/2014 Discharge date: 08/05/2014  Recommendations for Outpatient Follow-up:  Palliative care evaluation and follow up while at SNF Check CBC/BMET in 1 week  PRIMARY DISCHARGE DIAGNOSIS:  Principal Problem:   Acute on chronic respiratory failure Active Problems:   GERD   HTN (hypertension)   COPD exacerbation   Protein-calorie malnutrition, severe   Hypoxia   Bronchiectasis with acute exacerbation      PAST MEDICAL HISTORY: Past Medical History  Diagnosis Date  . 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 COPD (chronic obstructive pulmonary disease)   . Bronchiectasis   . Hypercalcemia   . GERD (gastroesophageal reflux disease)   . Hypertension   . Asthma     hx of PNA  . Adult failure to thrive 08/06/2012    20 pound weight loss one year, anorexia, pernicious anemia   . 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 CXR 01/23/12- again shows essentially stable severe fibro--cystic scarring with bronchiectasis and air fluid levels, but no obvious progression. Desat to 86% with exertion walking room air- order portable O2   . GERD   . Pernicious anemia     DISCHARGE MEDICATIONS: Current Discharge Medication List    START taking these medications   Details  azithromycin (ZITHROMAX) 500 MG tablet Take 1 tablet (500 mg total) by mouth daily. For 4 more days from 08/04/14 Refills: 0    feeding supplement, ENSURE COMPLETE, (ENSURE COMPLETE) LIQD Take 237 mLs by mouth 3 (three) times daily with meals.    pantoprazole (PROTONIX) 40 MG tablet Take 1 tablet (40 mg total) by mouth daily.    predniSONE (DELTASONE) 5 MG tablet Label  & dispense according to the schedule below. 10 Pills PO for 3 days  then, 8 Pills PO for 3 days, 6 Pills PO for 3 days, 4 Pills PO for 3 days, 2 Pills PO for 3 days, 1 Pills PO for 3 days, 1/2 Pill  PO for 3 days then STOP. Total 95 pills. Qty: 95 tablet, Refills: 0      CONTINUE these medications which have CHANGED   Details  !! levalbuterol (XOPENEX) 0.63 MG/3ML nebulizer solution Take 3 mLs (0.63 mg total) by nebulization every 3 (three) hours as needed for wheezing or shortness of breath. Qty: 3 mL, Refills: 12    !! levalbuterol (XOPENEX) 0.63 MG/3ML nebulizer solution Take 3 mLs (0.63 mg total) by nebulization 3 (three) times daily. Qty: 3 mL, Refills: 0    traMADol (ULTRAM) 50 MG tablet Take 1 tablet (50 mg total) by mouth every 6 (six) hours as needed. Qty: 30 tablet, Refills: 0   Associated Diagnoses: Thoracic spine pain     !! - Potential duplicate medications found. Please discuss with provider.    CONTINUE these medications which have NOT CHANGED   Details  acetaminophen (TYLENOL) 500 MG tablet Take 500 mg by mouth every 6 (six) hours as needed for mild pain or moderate pain.    calcium carbonate (TUMS - DOSED IN MG ELEMENTAL CALCIUM) 500 MG chewable tablet Chew 1 tablet by mouth daily as needed for indigestion or heartburn.    cyanocobalamin (,VITAMIN B-12,) 1000 MCG/ML injection Inject 1 mL (1,000 mcg total) into the muscle every 30 (thirty) days. Qty: 1 mL, Refills: 0  diltiazem (CARDIZEM CD) 120 MG 24 hr capsule Take 1 capsule (120 mg total) by mouth daily. Qty: 90 capsule, Refills: 3    furosemide (LASIX) 20 MG tablet Take 20 mg by mouth as needed (swelling).    ipratropium (ATROVENT) 0.02 % nebulizer solution Take 0.5 mg by nebulization 3 (three) times daily. Pt uses prn    Polyethyl Glycol-Propyl Glycol (SYSTANE) 0.4-0.3 % SOLN Place 1 drop into both eyes daily as needed (for dry eyes).     potassium chloride (K-DUR) 10 MEQ tablet Take 10 mEq by mouth as needed (takes only with lasix).        ALLERGIES:   Allergies    Allergen Reactions  . Albuterol Anaphylaxis    Makes heart race  . Daliresp [Roflumilast] Other (See Comments)    Mental changes, feeling weird  . Doxycycline Other (See Comments)    Bad taste in mouth, "salty coating"  . Levaquin [Levofloxacin] Palpitations and Other (See Comments)    Joint pains/aches  . Other Other (See Comments)    DAIRY PRODUCTS-causes thick mucous secretions  . Sulfonamide Derivatives Nausea Only    BRIEF HPI:  See H&P, Labs, Consult and Test reports for all details in brief, 73 y.o. female with a history of end stage bronchiectasis/COPD on home O2 presented to the emergency department with worsening shortness of breath. In the emergency department she was found to be in acute hypoxemic respiratory failure. Chest x-ray showed numerous cystic spaces within the lungs that were felt to be stable in appearance by radiology.Patient was started on IV Abx, steroids, and admitted for further care.  CONSULTATIONS:   None  PERTINENT RADIOLOGIC STUDIES: Dg Ribs Unilateral W/chest Right  08/01/2014   CLINICAL DATA:  Severe shortness of breath with constant cough. Unable to do some of the positions for imaging. History of COPD.  EXAM: RIGHT RIBS AND CHEST - 3+ VIEW  COMPARISON:  09/16/2013  FINDINGS: Positioning is nonstandard. Multiple oblique views demonstrate no acute rib fractures. Numerous cystic cavities are identified throughout the lungs, unchanged since prior study. Small air-fluid levels are identified in multiple spaces. The heart size is probably normal. No definite pleural effusions.  IMPRESSION: 1. No evidence for acute fracture. 2. Numerous cystic spaces within the lungs, stable in appearance.   Electronically Signed   By: Rosalie Gums M.D.   On: 08/01/2014 15:10     PERTINENT LAB RESULTS: CBC: No results for input(s): WBC, HGB, HCT, PLT in the last 72 hours. CMET CMP     Component Value Date/Time   NA 140 08/03/2014 0248   K 4.5 08/03/2014 0248   CL 99  08/03/2014 0248   CO2 35* 08/03/2014 0248   GLUCOSE 108* 08/03/2014 0248   BUN 17 08/03/2014 0248   CREATININE 0.30* 08/03/2014 0248   CALCIUM 9.1 08/03/2014 0248   PROT 6.8 08/03/2014 0248   ALBUMIN 2.6* 08/03/2014 0248   AST 12 08/03/2014 0248   ALT 9 08/03/2014 0248   ALKPHOS 128* 08/03/2014 0248   BILITOT <0.2* 08/03/2014 0248   GFRNONAA >90 08/03/2014 0248   GFRAA >90 08/03/2014 0248    GFR Estimated Creatinine Clearance: 50.3 mL/min (by C-G formula based on Cr of 0.3). No results for input(s): LIPASE, AMYLASE in the last 72 hours. No results for input(s): CKTOTAL, CKMB, CKMBINDEX, TROPONINI in the last 72 hours. Invalid input(s): POCBNP No results for input(s): DDIMER in the last 72 hours. No results for input(s): HGBA1C in the last 72 hours. No results for  input(s): CHOL, HDL, LDLCALC, TRIG, CHOLHDL, LDLDIRECT in the last 72 hours. No results for input(s): TSH, T4TOTAL, T3FREE, THYROIDAB in the last 72 hours.  Invalid input(s): FREET3 No results for input(s): VITAMINB12, FOLATE, FERRITIN, TIBC, IRON, RETICCTPCT in the last 72 hours. Coags: No results for input(s): INR in the last 72 hours.  Invalid input(s): PT Microbiology: Recent Results (from the past 240 hour(s))  MRSA PCR Screening     Status: None   Collection Time: 08/01/14  8:00 PM  Result Value Ref Range Status   MRSA by PCR NEGATIVE NEGATIVE Final    Comment:        The GeneXpert MRSA Assay (FDA approved for NASAL specimens only), is one component of a comprehensive MRSA colonization surveillance program. It is not intended to diagnose MRSA infection nor to guide or monitor treatment for MRSA infections.   Culture, blood (routine x 2)     Status: None (Preliminary result)   Collection Time: 08/01/14  9:45 PM  Result Value Ref Range Status   Specimen Description BLOOD RIGHT ANTECUBITAL  Final   Special Requests BOTTLES DRAWN AEROBIC AND ANAEROBIC 10CC EA  Final   Culture  Setup Time   Final     08/02/2014 04:08 Performed at [facility]    Culture   Final           BLOOD CULTURE RECEIVED NO GROWTH TO DATE CULTURE WILL BE HELD FOR 5 DAYS BEFORE ISSUING A FINAL NEGATIVE REPORT Performed at [facility]    Report Status PENDING  Incomplete  Culture, blood (routine x 2)     Status: None (Preliminary result)   Collection Time: 08/01/14 10:00 PM  Result Value Ref Range Status   Specimen Description BLOOD LEFT ANTECUBITAL  Final   Special Requests BOTTLES DRAWN AEROBIC AND ANAEROBIC 10CC EA  Final   Culture  Setup Time   Final    08/02/2014 04:08 Performed at [facility]    Culture   Final           BLOOD CULTURE RECEIVED NO GROWTH TO DATE CULTURE WILL BE HELD FOR 5 DAYS BEFORE ISSUING A FINAL NEGATIVE REPORT Performed at [facility]    Report Status PENDING  Incomplete     BRIEF HOSPITAL COURSE:   Acute on chronic respiratory failure:suspect acute resp failure secondary to viral/bronchitis process, but suspect big component is due to progression of underlying severe chronic cystic bronchiectasis. Since cultures negative, Cefepime was stopped, will plan to continue with Zithromax for one week total and stop.Was on IV Solumedrol, plan to transition to prednisone-slow taper over several weeks.   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 She claims she wants to "stay positive",and claims to be aware of her poor prognoses.She is stable at rest, but at baseline claims to have difficulty walking 10-15 yards!!Unfortunately only some but not significant improvement with  Abx/Streoids.Transitioned from Solumedrol to Prednisone.Continue Zithromax for one week total. Continue Bronchodilators.Suspect nothing else to offer while inpatient.Follow up with Primary Pulmonologist in 1 week. Will benefit from Palliative care evaluation while at SNF  ONG:[id number]:controlled with Cardizem on discharge  GERD:c/w PPI on discharge  Severe protein calorie malnutrition /  Adult FTT / pulmonary cachexia:poor overall prognoses-this is all likely secondary to severe lung disease. SNF on discharge. Continue Nutritional supplements. Will benefit from Palliative care evaluation while at SNF.  TODAY-DAY OF DISCHARGE:  Subjective:   Wilfred LacyJean Detloff today has no headache,no chest abdominal pain,no new weakness tingling or numbness. Improved but with still with exertional dyspnea (has this at baseline)  Objective:   Blood pressure 132/65, pulse 78, temperature 97.5 F (36.4 C), temperature source Axillary, resp. rate 20, height 5\' 6"  (1.676 m), weight 52.4 kg (115 lb 8.3 oz), SpO2 94 %.  Intake/Output Summary (Last 24 hours) at 08/05/14 1106 Last data filed at 08/05/14 0909  Gross per 24 hour  Intake    600 ml  Output    450 ml  Net    150 ml   Filed Weights   08/01/14 1319 08/01/14 1945  Weight: 52.164 kg (115 lb) 52.4 kg (115 lb 8.3 oz)    Exam Gen Exam: Awake and alert with clear speech.  Neck: Supple, No JVD.  Chest:Fine rales at bases-some scattered rhonchi CVS: S1 S2 Regular, no murmurs.  Abdomen: soft, BS +, non tender, non distended.  Extremities: no edema, lower extremities warm to touch. Neurologic: Non Focal.   DISCHARGE CONDITION: Stable  DISPOSITION: SNF  DISCHARGE INSTRUCTIONS:    Activity:  As tolerated with Full fall precautions use walker/cane & assistance as needed  Diet recommendation: Heart Healthy diet  Discharge Instructions    Call MD for:  difficulty breathing, headache or visual disturbances    Complete by:  As directed      Diet - low  sodium heart healthy    Complete by:  As directed      Increase activity slowly    Complete by:  As directed            Follow-up Information    Follow up with Rene PaciValerie Leschber, MD. Schedule an appointment as soon as possible for a visit in 1 week.   Specialty:  Internal Medicine   Why:  after discharge from SNF   Contact information:   [address] [place] [address] [phone]       Follow up with Waymon BudgeYOUNG,CLINTON D, MD. Schedule an appointment as soon as possible for a visit in 1 week.   Specialty:  Pulmonary Disease   Contact information:   [address]  [facility] [place] [address] [phone]      Total Time spent on discharge equals 45 minutes.  SignedJeoffrey Massed: Kaleo Condrey 08/05/2014 11:06 AM

## 2014-08-04 NOTE — Plan of Care (Signed)
Problem: Phase I Progression Outcomes Goal: O2 sats > or equal 90% or at baseline Outcome: Completed/Met Date Met:  08/04/14 On 3L n/c home oxygen.

## 2014-08-04 NOTE — Progress Notes (Signed)
PATIENT DETAILS Name: Stacy Novak Age: 73 y.o. Sex: female Date of Birth: 04/26/1941 Admit Date: 08/01/2014 Admitting Physician Jeralyn BennettEzequiel Zamora, MD ZOX:[id number]:Valerie [name], MD  Brief Narrative: 73 y.o. female with a history of end stage bronchiectasis/COPD on home O2 presented to the emergency department with worsening shortness of breath. In the emergency department she was found to be in acute hypoxemic respiratory failure. Chest x-ray showed numerous cystic spaces within the lungs that were felt to be stable in appearance by radiology.Patient was started on IV Abx, steroids, and admitted for further care.  Subjective: No major complaints overnight. Not willing to engage in severity of her condition-claims is aware of her poor prognoses. Not willing to start palliative measures just yet.  Assessment/Plan: Principal Problem:   Acute on chronic respiratory failure:suspect acute resp failure secondary to viral/bronchitis process, but suspect big component is due to progression of underlying severe chronic cystic bronchiectasis. Since cultures negative, will stop Cefepime and just continue with Zithromax for one week total and stop.Continue Solumedrol, plan to transition to prednisone-slow taper over several weeks.   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 She claims she wants to "stay positive",and claims to be aware of her poor prognoses.Some but not much improvement with Abx/Streoids.Will slowly transition from Solumedrol to Prednisone.Continue Zithromax for one week total. Continue Bronchodilators.Suspect nothing  else to offer while inpatient.  UJW:[id number]:controlled with Cardizem,   GERD:c/w PPI  Severe protein calorie malnutrition / Adult FTT / pulmonary cachexia:poor overall prognoses-this is all likely secondary to severe lung disease. SNF on discharge. Continue Nutritional supplements. Will benefit from Palliative care follow up on discharge.  Disposition: Remain inpatient  Antibiotics: See below   Anti-infectives    Start     Dose/Rate Route Frequency Ordered Stop   08/04/14 2200  azithromycin (ZITHROMAX) tablet 500 mg     500 mg Oral Daily 08/04/14 0952     08/01/14 2100  azithromycin (ZITHROMAX) 500 mg in dextrose 5 % 250 mL IVPB  Status:  Discontinued     500 mg250 mL/hr over 60 Minutes Intravenous Every 24 hours 08/01/14 1946 08/04/14 0951   08/01/14 2100  ceFEPIme (MAXIPIME) 1 g in dextrose 5 % 50 mL IVPB  Status:  Discontinued     1 g100 mL/hr over 30 Minutes Intravenous 3 times per day 08/01/14 1958 08/04/14 0951      DVT Prophylaxis: Prophylactic Lovenox  Code Status: Full code  Family Communication Son at bedside  Procedures:  None  CONSULTS:  None  Time spent 40 minutes-which includes 50% of the time with face-to-face with patient/ family and coordinating care related to the above assessment and plan.  MEDICATIONS: Scheduled Meds: . antiseptic oral rinse  7 mL Mouth Rinse BID  . azithromycin  500 mg Oral Daily  . diltiazem  120 mg Oral QHS  . enoxaparin (LOVENOX) injection  40 mg Subcutaneous Q24H  . feeding supplement (ENSURE COMPLETE)  237 mL Oral TID WC  . ipratropium  0.5 mg Nebulization TID  . levalbuterol  0.63 mg Nebulization TID  . methylPREDNISolone (SOLU-MEDROL) injection  60 mg Intravenous Q12H  . pantoprazole  40 mg Oral Daily  .  sodium chloride  3 mL Intravenous Q12H   Continuous Infusions:  PRN Meds:.acetaminophen **OR** acetaminophen, alum & mag hydroxide-simeth, levalbuterol, morphine injection, ondansetron **OR** ondansetron (ZOFRAN) IV,  oxyCODONE, polyvinyl alcohol    PHYSICAL EXAM: Vital signs in last 24 hours: Filed Vitals:   08/03/14 2129 08/04/14 0457 08/04/14 0833 08/04/14 0900  BP: 104/60 109/50  123/59  Pulse: 88 74  68  Temp: 98 F (36.7 C) 98.1 F (36.7 C)  97.5 F (36.4 C)  TempSrc: Axillary Oral  Oral  Resp: 21 20  18   Height:      Weight:      SpO2: 92% 96% 92% 94%    Weight change:  Filed Weights   08/01/14 1319 08/01/14 1945  Weight: 52.164 kg (115 lb) 52.4 kg (115 lb 8.3 oz)   Body mass index is 18.65 kg/(m^2).   Gen Exam: Awake and alert with clear speech.   Neck: Supple, No JVD.   Chest:Fine rales at bases-some scattered rhonchi CVS: S1 S2 Regular, no murmurs.  Abdomen: soft, BS +, non tender, non distended.  Extremities: no edema, lower extremities warm to touch. Neurologic: Non Focal.   Skin: No Rash.   Wounds: N/A.    Intake/Output from previous day:  Intake/Output Summary (Last 24 hours) at 08/04/14 1343 Last data filed at 08/04/14 0950  Gross per 24 hour  Intake    240 ml  Output    150 ml  Net     90 ml     LAB RESULTS: CBC  Recent Labs Lab 08/01/14 1400 08/02/14 0315  WBC 9.4 5.4  HGB 13.2 12.8  HCT 44.0 43.2  PLT 311 324  MCV 90.5 90.8  MCH 27.2 26.9  MCHC 30.0 29.6*  RDW 13.0 12.8    Chemistries   Recent Labs Lab 08/01/14 1400 08/02/14 0315 08/03/14 0248  NA 141 137 140  K 4.3 4.6 4.5  CL 94* 95* 99  CO2 38* 36* 35*  GLUCOSE 78 120* 108*  BUN 18 18 17   CREATININE 0.32* 0.37* 0.30*  CALCIUM 9.2 9.1 9.1    CBG: No results for input(s): GLUCAP in the last 168 hours.  GFR Estimated Creatinine Clearance: 50.3 mL/min (by C-G formula based on Cr of 0.3).  Coagulation profile No results for input(s): INR, PROTIME in the last 168 hours.  Cardiac Enzymes No results for input(s): CKMB, TROPONINI, MYOGLOBIN in the last 168 hours.  Invalid input(s): CK  Invalid input(s): POCBNP No results for input(s): DDIMER in the last 72 hours. No  results for input(s): HGBA1C in the last 72 hours. No results for input(s): CHOL, HDL, LDLCALC, TRIG, CHOLHDL, LDLDIRECT in the last 72 hours.  Recent Labs  08/01/14 2145  TSH 0.470   No results for input(s): VITAMINB12, FOLATE, FERRITIN, TIBC, IRON, RETICCTPCT in the last 72 hours. No results for input(s): LIPASE, AMYLASE in the last 72 hours.  Urine Studies No results for input(s): UHGB, CRYS in the last 72 hours.  Invalid input(s): UACOL, UAPR, USPG, UPH, UTP, UGL, UKET, UBIL, UNIT, UROB, ULEU, UEPI, UWBC, URBC, UBAC, CAST, UCOM, BILUA  MICROBIOLOGY: Recent Results (from the past 240 hour(s))  MRSA PCR Screening     Status: None   Collection Time: 08/01/14  8:00 PM  Result Value Ref Range Status   MRSA by PCR NEGATIVE NEGATIVE Final    Comment:        The GeneXpert MRSA Assay (FDA approved for NASAL specimens only), is one component of a comprehensive MRSA  colonization surveillance program. It is not intended to diagnose MRSA infection nor to guide or monitor treatment for MRSA infections.   Culture, blood (routine x 2)     Status: None (Preliminary result)   Collection Time: 08/01/14  9:45 PM  Result Value Ref Range Status   Specimen Description BLOOD RIGHT ANTECUBITAL  Final   Special Requests BOTTLES DRAWN AEROBIC AND ANAEROBIC 10CC EA  Final   Culture  Setup Time   Final    08/02/2014 04:08 Performed at [facility]    Culture   Final           BLOOD CULTURE RECEIVED NO GROWTH TO DATE CULTURE WILL BE HELD FOR 5 DAYS BEFORE ISSUING A FINAL NEGATIVE REPORT Performed at [facility]    Report Status PENDING  Incomplete  Culture, blood (routine x 2)     Status: None (Preliminary result)   Collection Time: 08/01/14 10:00 PM  Result Value Ref Range Status   Specimen Description BLOOD LEFT ANTECUBITAL  Final   Special Requests BOTTLES DRAWN AEROBIC AND ANAEROBIC 10CC EA  Final   Culture  Setup Time   Final    08/02/2014 04:08 Performed at [facility]    Culture   Final           BLOOD CULTURE RECEIVED NO GROWTH TO DATE CULTURE WILL BE HELD FOR 5 DAYS BEFORE ISSUING A FINAL NEGATIVE REPORT Performed at [facility]    Report Status PENDING  Incomplete    RADIOLOGY STUDIES/RESULTS: Dg Ribs Unilateral W/chest Right  08/01/2014   CLINICAL DATA:  Severe shortness of breath with constant cough. Unable to do some of the positions for imaging. History of COPD.  EXAM: RIGHT RIBS AND CHEST - 3+ VIEW  COMPARISON:  09/16/2013  FINDINGS: Positioning is nonstandard. Multiple oblique views demonstrate no acute rib fractures. Numerous cystic cavities are identified throughout the lungs, unchanged since prior study. Small air-fluid levels are identified in multiple spaces. The heart size is probably normal. No definite pleural effusions.  IMPRESSION: 1. No evidence for acute fracture. 2. Numerous cystic spaces within the lungs, stable in appearance.   Electronically Signed   By: Rosalie Gums M.D.   On: 08/01/2014 15:10    Jeoffrey Massed, MD  [facility] Pager:[phone]  If 7PM-7AM, please contact night-coverage [email or web address] Password [email or web address] 08/04/2014, 1:43 PM   LOS: 3 days

## 2014-08-04 NOTE — Care Management Note (Signed)
CARE MANAGEMENT NOTE 08/04/2014  Patient:  Stacy Novak, Stacy Novak   Account Number:  [id number]  Date Initiated:  08/04/2014  Documentation initiated by:  Savoy [place]  Subjective/Objective Assessment:   CM following for progression and d/c planning.     Action/Plan:   08/04/2014 Met with pt to discuss d/c need, pt is considering short term rehab at a SNF, this CM informed CSW, V Crawford. IM given and explained.   Anticipated DC Date:  08/06/2014   Anticipated DC Plan:  SKILLED NURSING FACILITY         Choice offered to / List presented to:             Status of service:  In process, will continue to follow Medicare Important Message given?  YES (If response is "NO", the following Medicare IM given date fields will be blank) Date Medicare IM given:  08/04/2014 Medicare IM given by:  Nazaiah Navarrete Date Additional Medicare IM given:   Additional Medicare IM given by:    Discharge Disposition:    Per UR Regulation:    If discussed at Long Length of Stay Meetings, dates discussed:    Comments:

## 2014-08-04 NOTE — Clinical Social Work Note (Signed)
CSW received consult for SNF placement for ST rehab. Patient assessed and full assessment to follow.  Genelle BalVanessa Chantia Amalfitano, MSW, LCSW Clinical Social Work Department [facility] [phone]

## 2014-08-05 NOTE — Clinical Social Work Psychosocial (Signed)
Clinical Social Work Department BRIEF PSYCHOSOCIAL ASSESSMENT 08/05/2014  Patient:  Stacy Novak, Stacy Novak     Account Number:  [id number]     Admit date:  08/01/2014  Clinical Social Worker:  Frederico Hamman  Date/Time:  08/05/2014 11:10 AM  Referred by:  Physician  Date Referred:  08/03/2014 Referred for  SNF Placement   Other Referral:   Interview type:  Patient Other interview type:    PSYCHOSOCIAL DATA Living Status:  HUSBAND Admitted from facility:   Level of care:   Primary support name:  Stacy Novak Primary support relationship to patient:  SPOUSE Degree of support available:   Patient's husband and son supportiive per patient. Visited with patient on 12/16 and husband and son were at the bedside.    CURRENT CONCERNS Current Concerns  Post-Acute Placement   Other Concerns:    SOCIAL WORK ASSESSMENT / PLAN CSW met with patient on 08/04/14 regarding discharge planning. CSW talked with patient about the recommendation of short-term rehab at a skilled facility and patient is in agreement. Patient has never been to rehab before, so the facility search process was explained and her questions answered. Stacy Novak was given SNF list to review and choose a facility.    Patient later informed CSW that her facility preferences are [facility].   Assessment/plan status:  No Further Intervention Required Other assessment/ plan:   Information/referral to community resources:   On 08/04/14 Stacy Novak was given SNF list for [facility].    PATIENT'S/FAMILY'S RESPONSE TO PLAN OF CARE: Patient receptive to talking with CSW about discharge planning and is in agreement with short-term rehab.     Stacy Novak, MSW, [place] [phone]

## 2014-08-05 NOTE — Clinical Social Work Placement (Signed)
Clinical Social Work Department CLINICAL SOCIAL WORK PLACEMENT NOTE 08/05/2014  Patient:  [name],Marvina B  Account Number:  [id number] Admit date:  08/01/2014  Clinical Social Worker:  Genelle BalVANESSA Quinlan Mcfall, LCSW  Date/time:  08/05/2014 11:23 AM  Clinical Social Work is seeking post-discharge placement for this patient at the following level of care:   SKILLED NURSING   (*CSW will update this form in Epic as items are completed)   08/04/2014  Patient/family provided with [facility] Department of Clinical Social Work's list of facilities offering this level of care within the geographic area requested by the patient (or if unable, by the patient's family).  08/04/2014  Patient/family informed of their freedom to choose among providers that offer the needed level of care, that participate in Medicare, Medicaid or managed care program needed by the patient, have an available bed and are willing to accept the patient.    Patient/family informed of [facility]' ownership interest in [facility], as well as of the fact that they are under no obligation to receive care at this facility.  PASARR submitted to EDS on 08/04/2014 PASARR number received on 08/04/2014  FL2 transmitted to all facilities in geographic area requested by pt/family on  08/04/2014 FL2 transmitted to all facilities within larger geographic area on   Patient informed that his/her managed care company has contracts with or will negotiate with  certain facilities, including the following:     Patient/family informed of bed offers received:  08/05/2014 [facility] Physician recommends and patient chooses bed at    [facility] on  08/05/2014 Patient to be transferred to facility by ambulance Patient and family notified of transfer on 08/05/2014 Name of family member notified:  Husband Lyda JesterCurtis and Son Fayne NorrieHolmes Skaff, at the bedside  The following physician request  were entered in Epic:   Additional Comments:    Genelle BalVanessa Guthrie Lemme, MSW, LCSW Clinical Social Work Department [facility] [phone]

## 2014-08-05 NOTE — Progress Notes (Signed)
Patient Discharge: Disposition: [facility]  Education: Given report [facility] the [facility]. IV:  Discontinued before discharge. Transportation: Family transported the patient in w/c to the SNF. Belongings: Patient took all her belongings with her.

## 2014-08-06 ENCOUNTER — Ambulatory Visit: Payer: Medicare Other

## 2014-08-06 ENCOUNTER — Ambulatory Visit: Payer: Medicare Other | Admitting: Internal Medicine

## 2014-08-07 ENCOUNTER — Non-Acute Institutional Stay (SKILLED_NURSING_FACILITY): Payer: Medicare Other | Admitting: Adult Health

## 2014-08-07 ENCOUNTER — Encounter: Payer: Self-pay | Admitting: Adult Health

## 2014-08-07 DIAGNOSIS — K219 Gastro-esophageal reflux disease without esophagitis: Secondary | ICD-10-CM

## 2014-08-07 DIAGNOSIS — J471 Bronchiectasis with (acute) exacerbation: Secondary | ICD-10-CM

## 2014-08-07 DIAGNOSIS — I10 Essential (primary) hypertension: Secondary | ICD-10-CM

## 2014-08-07 DIAGNOSIS — E43 Unspecified severe protein-calorie malnutrition: Secondary | ICD-10-CM

## 2014-08-07 NOTE — Progress Notes (Signed)
Patient ID: Stacy Novak, female   DOB: 06/11/1941, 73 y.o.   MRN: [id number]   08/07/2014  Facility:  Nursing Home Location:  [facility] Room Number: 903-1 LEVEL OF CARE:  SNF (31)   Chief Complaint  Patient presents with  . Hospitalization Follow-up    Bronchiectasis with acute exacerbation, hypertension, GERD, COPD, protein calorie malnutrition and physical deconditioning    HISTORY OF PRESENT ILLNESS:  This is a 73 year old female who has been [facility]05[facility]14 [facility] with acute on chronic respiratory failure secondary to viral/bronchiectasis. Blood cultures done in the hospital were negative so cefepime was discontinued. She is now on Zithromax for a total of 1 week. IV Solu-Medrol was transitioned to prednisone-slow taper over several weeks. She has been [facility] due to physical deconditioning and will have short-term rehabilitation.  REASSESSMENT OF ONGOING PROBLEMS:   HTN: Pt 's HTN remains stable.  Denies CP, DOE, headaches, dizziness or visual disturbances.  No complications from the medications currently being used.  Last BP : 124/62  GERD: pt's GERD is stable.  Denies ongoing heartburn, abd. Pain, nausea or vomiting.  Currently on a PPI & tolerates it without any adverse reactions.   PAST MEDICAL HISTORY:  Past Medical History  Diagnosis Date  . 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 COPD (chronic obstructive pulmonary disease)   . Bronchiectasis   . Hypercalcemia   . GERD (gastroesophageal reflux disease)   . Hypertension   . Asthma     hx of PNA  . Adult failure to thrive 08/06/2012    20 pound weight loss one year, anorexia, pernicious anemia   . 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 CXR 01/23/12- again shows essentially stable severe fibro--cystic scarring with bronchiectasis and air  fluid levels, but no obvious progression. Desat to 86% with exertion walking room air- order portable O2   . GERD   . Pernicious anemia     CURRENT MEDICATIONS: Reviewed per MAR/see medication list  Allergies  Allergen Reactions  . Albuterol Anaphylaxis    Makes heart race  . Daliresp [Roflumilast] Other (See Comments)    Mental changes, feeling weird  . Doxycycline Other (See Comments)    Bad taste in mouth, "salty coating"  . Levaquin [Levofloxacin] Palpitations and Other (See Comments)    Joint pains/aches  . Other Other (See Comments)    DAIRY PRODUCTS-causes thick mucous secretions  . Sulfonamide Derivatives Nausea Only     REVIEW OF SYSTEMS:  GENERAL: no change in appetite, no fatigue, no weight changes, no fever, chills or weakness RESPIRATORY: no cough, SOB, DOE, wheezing, hemoptysis CARDIAC: no chest pain, or palpitations GI: no abdominal pain, diarrhea, constipation, heart burn, nausea or vomiting  PHYSICAL EXAMINATION  GENERAL: no acute distress, normal body habitus EYES: conjunctivae normal, sclerae normal, normal eye lids NECK: supple, trachea midline, no neck masses, no thyroid tenderness, no thyromegaly LYMPHATICS: no LAN in the neck, no supraclavicular LAN RESPIRATORY: breathing is even & unlabored CARDIAC: RRR, no murmur,no extra heart sounds, no edema GI: abdomen soft, normal BS, no masses, no tenderness, no hepatomegaly, no splenomegaly EXTREMITIES: Able to move all 4 extremities;  Uses wheelchair; LLE edema 1+ PSYCHIATRIC: the patient is alert & oriented to person, affect & behavior appropriate  LABS/RADIOLOGY: Labs reviewed: Basic Metabolic Panel:  Recent Labs  40/98/1111/08/05  1400 08/02/14 0315 08/03/14 0248  NA 141 137 140  K 4.3 4.6 4.5  CL 94* 95* 99  CO2 38* 36* 35*  GLUCOSE 78 120* 108*  BUN 18 18 17   CREATININE 0.32* 0.37* 0.30*  CALCIUM 9.2 9.1 9.1   Liver Function Tests:  Recent Labs  10/17/13 1403 08/03/14 0248  AST 16 12  ALT  10 9  ALKPHOS 77 128*  BILITOT 0.6 <0.2*  PROT 7.7 6.8  ALBUMIN 2.8* 2.6*    CBC:  Recent Labs  10/17/13 1403  05/17/14 1936  05/21/14 0531 08/01/14 1400 08/02/14 0315  WBC 8.3  < > 7.5  < > 7.3 9.4 5.4  NEUTROABS 5.9  --  5.6  --   --   --   --   HGB 13.0  < > 13.4  < > 11.9* 13.2 12.8  HCT 41.8  < > 42.5  < > 39.2 44.0 43.2  MCV 86.6  < > 88.4  < > 88.1 90.5 90.8  PLT 283.0  < > 239  < > 240 311 324  < > = values in this interval not displayed.  Lipid Panel:  Recent Labs  10/17/13 1403  HDL 54.80   Cardiac Enzymes:  Recent Labs  01/06/14 1746  TROPONINI <0.30   CBG:  Recent Labs  05/22/14 2112 05/23/14 0751 05/23/14 1232  GLUCAP 138* 96 85    Dg Ribs Unilateral W/chest Right  08/01/2014   CLINICAL DATA:  Severe shortness of breath with constant cough. Unable to do some of the positions for imaging. History of COPD.  EXAM: RIGHT RIBS AND CHEST - 3+ VIEW  COMPARISON:  09/16/2013  FINDINGS: Positioning is nonstandard. Multiple oblique views demonstrate no acute rib fractures. Numerous cystic cavities are identified throughout the lungs, unchanged since prior study. Small air-fluid levels are identified in multiple spaces. The heart size is probably normal. No definite pleural effusions.  IMPRESSION: 1. No evidence for acute fracture. 2. Numerous cystic spaces within the lungs, stable in appearance.   Electronically Signed   By: Rosalie GumsBeth  Brown M.D.   On: 08/01/2014 15:10    ASSESSMENT/PLAN:  Physical deconditioning - for rehabilitation Bronchiectasis with acute exacerbation - continue Zithromax, prednisone-slow taper, Xopenex via neb twice a day and when necessary and Atrovent 0.5 mg via neb twice a day Hypertension - well controlled; continue Cardizem CD 120 mg by mouth daily GERD - stable; continue Protonix 40 mg by mouth daily Protein calorie malnutrition, severe - continue supplementation; RD consult   Spent 50 minutes in patient care.       Dr. Pila'S HospitalMEDINA-VARGAS,Milissa Fesperman, NP [facility] [phone]

## 2014-08-08 LAB — CULTURE, BLOOD (ROUTINE X 2)
CULTURE: NO GROWTH
CULTURE: NO GROWTH

## 2014-08-09 ENCOUNTER — Encounter (HOSPITAL_COMMUNITY): Payer: Self-pay

## 2014-08-09 ENCOUNTER — Inpatient Hospital Stay (HOSPITAL_COMMUNITY)
Admission: EM | Admit: 2014-08-09 | Discharge: 2014-08-21 | DRG: 371 | Disposition: E | Payer: Medicare Other | Attending: Internal Medicine | Admitting: Internal Medicine

## 2014-08-09 DIAGNOSIS — Z6820 Body mass index (BMI) 20.0-20.9, adult: Secondary | ICD-10-CM

## 2014-08-09 DIAGNOSIS — Z515 Encounter for palliative care: Secondary | ICD-10-CM

## 2014-08-09 DIAGNOSIS — K659 Peritonitis, unspecified: Secondary | ICD-10-CM | POA: Diagnosis not present

## 2014-08-09 DIAGNOSIS — E43 Unspecified severe protein-calorie malnutrition: Secondary | ICD-10-CM | POA: Diagnosis present

## 2014-08-09 DIAGNOSIS — Z7952 Long term (current) use of systemic steroids: Secondary | ICD-10-CM

## 2014-08-09 DIAGNOSIS — R109 Unspecified abdominal pain: Secondary | ICD-10-CM

## 2014-08-09 DIAGNOSIS — J9621 Acute and chronic respiratory failure with hypoxia: Secondary | ICD-10-CM | POA: Insufficient documentation

## 2014-08-09 DIAGNOSIS — A310 Pulmonary mycobacterial infection: Secondary | ICD-10-CM | POA: Insufficient documentation

## 2014-08-09 DIAGNOSIS — R627 Adult failure to thrive: Secondary | ICD-10-CM | POA: Diagnosis present

## 2014-08-09 DIAGNOSIS — A312 Disseminated mycobacterium avium-intracellulare complex (DMAC): Secondary | ICD-10-CM | POA: Diagnosis present

## 2014-08-09 DIAGNOSIS — Z9981 Dependence on supplemental oxygen: Secondary | ICD-10-CM

## 2014-08-09 DIAGNOSIS — K668 Other specified disorders of peritoneum: Secondary | ICD-10-CM

## 2014-08-09 DIAGNOSIS — R64 Cachexia: Secondary | ICD-10-CM | POA: Diagnosis present

## 2014-08-09 DIAGNOSIS — I10 Essential (primary) hypertension: Secondary | ICD-10-CM | POA: Diagnosis present

## 2014-08-09 DIAGNOSIS — J9610 Chronic respiratory failure, unspecified whether with hypoxia or hypercapnia: Secondary | ICD-10-CM | POA: Diagnosis present

## 2014-08-09 DIAGNOSIS — K219 Gastro-esophageal reflux disease without esophagitis: Secondary | ICD-10-CM | POA: Diagnosis present

## 2014-08-09 DIAGNOSIS — I4891 Unspecified atrial fibrillation: Secondary | ICD-10-CM | POA: Diagnosis not present

## 2014-08-09 DIAGNOSIS — J45909 Unspecified asthma, uncomplicated: Secondary | ICD-10-CM | POA: Diagnosis present

## 2014-08-09 DIAGNOSIS — F419 Anxiety disorder, unspecified: Secondary | ICD-10-CM | POA: Diagnosis not present

## 2014-08-09 DIAGNOSIS — Z66 Do not resuscitate: Secondary | ICD-10-CM | POA: Diagnosis present

## 2014-08-09 DIAGNOSIS — R1030 Lower abdominal pain, unspecified: Secondary | ICD-10-CM | POA: Diagnosis not present

## 2014-08-09 DIAGNOSIS — R198 Other specified symptoms and signs involving the digestive system and abdomen: Secondary | ICD-10-CM | POA: Insufficient documentation

## 2014-08-09 DIAGNOSIS — Z792 Long term (current) use of antibiotics: Secondary | ICD-10-CM

## 2014-08-09 DIAGNOSIS — D510 Vitamin B12 deficiency anemia due to intrinsic factor deficiency: Secondary | ICD-10-CM | POA: Diagnosis present

## 2014-08-09 DIAGNOSIS — K631 Perforation of intestine (nontraumatic): Secondary | ICD-10-CM | POA: Diagnosis present

## 2014-08-09 NOTE — ED Provider Notes (Signed)
CSN: [id number]     Arrival date & time 07/31/2014  2351 History  This chart was scribed for Tomasita CrumbleAdeleke Kwesi Sangha, MD by Karle PlumberJennifer Tensley, ED Scribe. This patient was seen in room A12C/A12C and the patient's care was started at 11:55 PM.  Chief Complaint  Patient presents with  . Abdominal Pain   HPI  HPI Comments:  Stacy Novak is a 73 y.o. female with PMH of COPD brought in by EMS [facility] , who presents to the Emergency Department complaining of severe abdominal pain that began two days ago. Patient states this began when she was moving things outside and doing strenuous activity. She reports associated SOB. She states the pain feels like tightness and cramping. She reports eating a lot of salty foods since being at the facility since 08/05/14 (5 days). Pt was discharged from hospital 5 days ago to the nursing facility following a COPD exacerbation. She currently appears in significant pain, complaining of her lower abdomen. Denies cough, vomiting, dysuria, hematuria. PMH of hypercalcemia, HTN, asthma and GERD.   Past Medical History  Diagnosis Date  . 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 COPD (chronic obstructive pulmonary disease)   . Bronchiectasis   . Hypercalcemia   . GERD (gastroesophageal reflux disease)   . Hypertension   . Asthma     hx of PNA  . Adult failure to thrive 08/06/2012    20 pound weight loss one year, anorexia, pernicious anemia   . 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 CXR 01/23/12- again shows essentially stable severe fibro--cystic scarring with bronchiectasis and air fluid levels, but no obvious progression. Desat to 86% with exertion walking room air- order portable O2   . GERD   . Pernicious anemia    Past Surgical History  Procedure Laterality Date  . Tonsillectomy    . Lung biopsy     Family History  Problem Relation Age of Onset  .  Hypertension    . Stroke Father   . Alzheimer's disease Mother   . Atrial fibrillation Mother   . Seizures Son    History  Substance Use Topics  . Smoking status: Never Smoker   . Smokeless tobacco: Never Used  . Alcohol Use: No   OB History    No data available     Review of Systems  Allergies  Albuterol; Daliresp; Doxycycline; Levaquin; Other; and Sulfonamide derivatives  Home Medications   Prior to Admission medications   Medication Sig Start Date End Date Taking? Authorizing Provider  acetaminophen (TYLENOL) 500 MG tablet Take 500 mg by mouth every 6 (six) hours as needed for mild pain or moderate pain.   Yes Historical Provider, MD  calcium carbonate (TUMS - DOSED IN MG ELEMENTAL CALCIUM) 500 MG chewable tablet Chew 1 tablet by mouth daily as needed for indigestion or heartburn.   Yes Historical Provider, MD  cyanocobalamin (,VITAMIN B-12,) 1000 MCG/ML injection Inject 1 mL (1,000 mcg total) into the muscle every 30 (thirty) days. 11/13/12  Yes Newt LukesValerie A Leschber, MD  diltiazem (CARDIZEM CD) 120 MG 24 hr capsule Take 1 capsule (120 mg total) by mouth daily. 02/04/14  Yes Newt LukesValerie A Leschber, MD  feeding supplement, ENSURE COMPLETE, (ENSURE COMPLETE) LIQD Take 237 mLs by mouth 3 (three) times daily with meals. 08/04/14  Yes Shanker Levora DredgeM Ghimire, MD  furosemide (LASIX) 20 MG tablet Take 20 mg by mouth as  needed (swelling). 05/23/14  Yes Esperanza Sheets, MD  ipratropium (ATROVENT) 0.02 % nebulizer solution Take 0.5 mg by nebulization 3 (three) times daily as needed for wheezing or shortness of breath.  01/15/14  Yes Marinda Elk, MD  levalbuterol Pauline Aus) 0.63 MG/3ML nebulizer solution Take 3 mLs (0.63 mg total) by nebulization every 3 (three) hours as needed for wheezing or shortness of breath. 08/04/14  Yes Shanker Levora Dredge, MD  pantoprazole (PROTONIX) 40 MG tablet Take 1 tablet (40 mg total) by mouth daily. 08/04/14  Yes Shanker Levora Dredge, MD  Polyethyl Glycol-Propyl Glycol  (SYSTANE) 0.4-0.3 % SOLN Place 1 drop into both eyes daily as needed (for dry eyes).    Yes Historical Provider, MD  potassium chloride (K-DUR) 10 MEQ tablet Take 10 mEq by mouth as needed (takes only with lasix). 05/23/14  Yes Esperanza Sheets, MD  traMADol (ULTRAM) 50 MG tablet Take 1 tablet (50 mg total) by mouth every 6 (six) hours as needed. Patient taking differently: Take 25-50 mg by mouth every 8 (eight) hours as needed (pain).  08/04/14  Yes Shanker Levora Dredge, MD  azithromycin (ZITHROMAX) 500 MG tablet Take 1 tablet (500 mg total) by mouth daily. For 4 more days from 08/04/14 Patient not taking: Reported on 08/10/2014 08/04/14   Maretta Bees, MD  levalbuterol Pauline Aus) 0.63 MG/3ML nebulizer solution Take 3 mLs (0.63 mg total) by nebulization 3 (three) times daily. Patient not taking: Reported on 08/10/2014 08/04/14   Maretta Bees, MD  predniSONE (DELTASONE) 5 MG tablet Label  & dispense according to the schedule below. 10 Pills PO for 3 days then, 8 Pills PO for 3 days, 6 Pills PO for 3 days, 4 Pills PO for 3 days, 2 Pills PO for 3 days, 1 Pills PO for 3 days, 1/2 Pill  PO for 3 days then STOP. Total 95 pills. 08/04/14   Shanker Levora Dredge, MD   Triage Vitals: BP 164/79 mmHg  Pulse 128  Temp(Src) 97.9 F (36.6 C) (Oral)  Resp 23  Ht 5\' 6"  (1.676 m)  Wt 128 lb (58.06 kg)  BMI 20.67 kg/m2  SpO2 91% Physical Exam  Constitutional: She is oriented to person, place, and time. She appears well-developed and well-nourished. She appears distressed. Nasal cannula in place.  HENT:  Head: Normocephalic and atraumatic.  Eyes: Conjunctivae and EOM are normal. Pupils are equal, round, and reactive to light. No scleral icterus.  Neck: Normal range of motion. Neck supple. No JVD present. No tracheal deviation present. No thyromegaly present.  Cardiovascular: Regular rhythm and normal heart sounds.  Tachycardia present.  Exam reveals no gallop and no friction rub.   No murmur  heard. Pulmonary/Chest: Breath sounds normal. Tachypnea noted. She is in respiratory distress. She has no wheezes. She exhibits no tenderness.  Abdominal: Soft. Bowel sounds are normal. She exhibits no distension and no mass. There is tenderness. There is rebound. There is no guarding.  Bilateral lower quadrant tenderness to palpation.   Musculoskeletal: Normal range of motion. She exhibits edema (bilateral lower extremity). She exhibits no tenderness.  Lymphadenopathy:    She has no cervical adenopathy.  Neurological: She is alert and oriented to person, place, and time. No cranial nerve deficit. She exhibits normal muscle tone.  Skin: Skin is warm and dry. No rash noted. No erythema. No pallor.  Nursing note and vitals reviewed.   ED Course  Procedures (including critical care time) DIAGNOSTIC STUDIES: Oxygen Saturation is 91% on 3 L/min [place],  low by my interpretation.   COORDINATION OF CARE: 11:59 PM- Will order pain medication, CT of abdomen pelvis and CXR. Pt verbalizes understanding and agrees to plan.  Medications  morphine 4 MG/ML injection 4 mg (not administered)  ondansetron (ZOFRAN) injection 4 mg (not administered)  sodium chloride 0.9 % bolus 1,000 mL (not administered)  iohexol (OMNIPAQUE) 300 MG/ML solution 25 mL (not administered)    Labs Review Labs Reviewed  CBC WITH DIFFERENTIAL - Abnormal; Notable for the following:    WBC 21.2 (*)    Neutrophils Relative % 92 (*)    Neutro Abs 19.6 (*)    Lymphocytes Relative 5 (*)    All other components within normal limits  I-STAT CHEM 8, ED - Abnormal; Notable for the following:    Chloride 94 (*)    BUN 24 (*)    Glucose, Bld 147 (*)    Hemoglobin 17.3 (*)    HCT 51.0 (*)    All other components within normal limits  URINE CULTURE  CULTURE, BLOOD (ROUTINE X 2)  CULTURE, BLOOD (ROUTINE X 2)  COMPREHENSIVE METABOLIC PANEL  LIPASE, BLOOD  URINALYSIS, ROUTINE W REFLEX MICROSCOPIC  I-STAT CG4 LACTIC ACID, ED  Rosezena SensorI-STAT  TROPOININ, ED    Imaging Review Dg Chest 2 View  08/10/2014   CLINICAL DATA:  Acute onset of shortness of breath. Initial encounter.  EXAM: CHEST  2 VIEW  COMPARISON:  Chest radiograph performed 08/01/2014  FINDINGS: Extensive chronic lung disease is again noted, with numerous cystic spaces throughout both lungs, and associated air-fluid levels. Underlying diffuse interstitial prominence is seen. There is no definite evidence of superimposed acute focal airspace consolidation. No pleural effusion or pneumothorax identified.  The cardiomediastinal silhouette is normal in size. No acute osseous abnormalities are seen.  Note is made of free intra-abdominal air under the right hemidiaphragm.  IMPRESSION: 1. Free intra-abdominal air noted under the right hemidiaphragm. 2. Extensive chronic lung disease again noted, with numerous cystic spaces and associated air-fluid levels in both lungs. Underlying diffuse interstitial prominence seen. No definite superimposed focal airspace consolidation noted. Critical Value/emergent results were called by telephone at the time of interpretation on 08/10/2014 at 1:10 am to Dr. Tomasita CrumbleADELEKE Stevan Eberwein, who verbally acknowledged these results.   Electronically Signed   By: Roanna RaiderJeffery  Chang M.D.   On: 08/10/2014 01:11   Dg Abd 2 Views  08/10/2014   CLINICAL DATA:  Low abdominal pain  EXAM: ABDOMEN - 2 VIEW  COMPARISON:  Chest radiograph same date and previous exams, dictated separately  FINDINGS: Evidence of chronic cystic lung disease reidentified with internal air-fluid levels. Presence of free air beneath the right hemidiaphragm is noted. A few borderline dilated left lower quadrant loops of small bowel are identified, largest 3.1 cm in maximal diameter. No colonic dilatation. No acute osseous finding.  IMPRESSION: Free air beneath the right hemidiaphragm suggesting intestinal perforation.  A few borderline dilated left lower quadrant loops of small bowel could indicate obstruction.   Evolving cystic lung disease likely bronchiectasis with internal air-fluid levels.  Critical Value/emergent results were called by telephone at the time of interpretation on 08/10/2014 at 1:12 am to Dr. Tomasita CrumbleADELEKE Kalaya Infantino , who verbally acknowledged these results.   Electronically Signed   By: Christiana PellantGretchen  Green M.D.   On: 08/10/2014 01:12     EKG Interpretation   Date/Time:  Monday August 10 2014 00:06:54 EST Ventricular Rate:  125 PR Interval:  141 QRS Duration: 92 QT Interval:  317 QTC Calculation: 457 R  Axis:   171 Text Interpretation:  Sinus tachycardia Probable RVH w/ secondary repol  abnormality Artifact in lead(s) I III aVR aVL aVF V2 Confirmed by Erroll Luna [phone]) on 08/10/2014 12:34:27 AM      MDM   Final diagnoses:  Abdominal pain    Patient presents emergency department out of concern for abdominal pain. This began sudden onset after doing strenuous work outside 2 days ago and is progressively gotten worse. Chest x-ray reveals free air under the right hemidiaphragm. This is consistent with an intestinal or gastric rupture. White count is 21 she is tachycardic to the 120s. Patient was given IV fluids and Zosyn, general surgery was paged for operative intervention.  CRITICAL CARE Performed by: Tomasita Crumble   Total critical care time: 40 minutes  Critical care time was exclusive of separately billable procedures and treating other patients.  Critical care was necessary to treat or prevent imminent or life-threatening deterioration.  Critical care was time spent personally by me on the following activities: development of treatment plan with patient and/or surrogate as well as nursing, discussions with consultants, evaluation of patient's response to treatment, examination of patient, obtaining history from patient or surrogate, ordering and performing treatments and interventions, ordering and review of laboratory studies, ordering and review of radiographic studies,  pulse oximetry and re-evaluation of patient's condition.   I personally performed the services described in this documentation, which was scribed in my presence. The recorded information has been reviewed and is accurate.    Tomasita Crumble, MD 08/10/14 [phone]

## 2014-08-09 NOTE — ED Notes (Signed)
[facility]  Pt was discharged from hospital 08/05/14 for COPD exacerbation.  Pt reports that Friday afternoon she began to have diffuse abdominal pain with diarrhea Friday morning.  Pt is on 3L of O2 at all times.  Per PTAR, no bowel sounds are audible.

## 2014-08-09 NOTE — ED Notes (Signed)
EMS reports pt also has a decubitus ulcer to sacral area.

## 2014-08-10 ENCOUNTER — Emergency Department (HOSPITAL_COMMUNITY): Payer: Medicare Other

## 2014-08-10 ENCOUNTER — Encounter (HOSPITAL_COMMUNITY): Payer: Self-pay | Admitting: Internal Medicine

## 2014-08-10 DIAGNOSIS — Z9981 Dependence on supplemental oxygen: Secondary | ICD-10-CM | POA: Diagnosis not present

## 2014-08-10 DIAGNOSIS — R1030 Lower abdominal pain, unspecified: Secondary | ICD-10-CM | POA: Diagnosis present

## 2014-08-10 DIAGNOSIS — F419 Anxiety disorder, unspecified: Secondary | ICD-10-CM | POA: Diagnosis not present

## 2014-08-10 DIAGNOSIS — Z7952 Long term (current) use of systemic steroids: Secondary | ICD-10-CM | POA: Diagnosis not present

## 2014-08-10 DIAGNOSIS — J9610 Chronic respiratory failure, unspecified whether with hypoxia or hypercapnia: Secondary | ICD-10-CM | POA: Diagnosis present

## 2014-08-10 DIAGNOSIS — K219 Gastro-esophageal reflux disease without esophagitis: Secondary | ICD-10-CM | POA: Diagnosis present

## 2014-08-10 DIAGNOSIS — Z515 Encounter for palliative care: Secondary | ICD-10-CM

## 2014-08-10 DIAGNOSIS — J45909 Unspecified asthma, uncomplicated: Secondary | ICD-10-CM | POA: Diagnosis present

## 2014-08-10 DIAGNOSIS — Z792 Long term (current) use of antibiotics: Secondary | ICD-10-CM | POA: Diagnosis not present

## 2014-08-10 DIAGNOSIS — J9621 Acute and chronic respiratory failure with hypoxia: Secondary | ICD-10-CM | POA: Diagnosis present

## 2014-08-10 DIAGNOSIS — R109 Unspecified abdominal pain: Secondary | ICD-10-CM

## 2014-08-10 DIAGNOSIS — I10 Essential (primary) hypertension: Secondary | ICD-10-CM | POA: Diagnosis present

## 2014-08-10 DIAGNOSIS — R64 Cachexia: Secondary | ICD-10-CM | POA: Diagnosis present

## 2014-08-10 DIAGNOSIS — E43 Unspecified severe protein-calorie malnutrition: Secondary | ICD-10-CM | POA: Diagnosis present

## 2014-08-10 DIAGNOSIS — K659 Peritonitis, unspecified: Secondary | ICD-10-CM | POA: Diagnosis present

## 2014-08-10 DIAGNOSIS — Z66 Do not resuscitate: Secondary | ICD-10-CM | POA: Diagnosis present

## 2014-08-10 DIAGNOSIS — I4891 Unspecified atrial fibrillation: Secondary | ICD-10-CM | POA: Diagnosis not present

## 2014-08-10 DIAGNOSIS — A312 Disseminated mycobacterium avium-intracellulare complex (DMAC): Secondary | ICD-10-CM | POA: Diagnosis present

## 2014-08-10 DIAGNOSIS — J9611 Chronic respiratory failure with hypoxia: Secondary | ICD-10-CM

## 2014-08-10 DIAGNOSIS — Z6820 Body mass index (BMI) 20.0-20.9, adult: Secondary | ICD-10-CM | POA: Diagnosis not present

## 2014-08-10 DIAGNOSIS — R627 Adult failure to thrive: Secondary | ICD-10-CM | POA: Diagnosis present

## 2014-08-10 DIAGNOSIS — D510 Vitamin B12 deficiency anemia due to intrinsic factor deficiency: Secondary | ICD-10-CM | POA: Diagnosis present

## 2014-08-10 DIAGNOSIS — K631 Perforation of intestine (nontraumatic): Secondary | ICD-10-CM | POA: Diagnosis present

## 2014-08-10 LAB — GLUCOSE, CAPILLARY
GLUCOSE-CAPILLARY: 112 mg/dL — AB (ref 70–99)
GLUCOSE-CAPILLARY: 83 mg/dL (ref 70–99)
Glucose-Capillary: 113 mg/dL — ABNORMAL HIGH (ref 70–99)

## 2014-08-10 LAB — COMPREHENSIVE METABOLIC PANEL
ALT: 18 U/L (ref 0–35)
ANION GAP: 8 (ref 5–15)
AST: 19 U/L (ref 0–37)
Albumin: 2.5 g/dL — ABNORMAL LOW (ref 3.5–5.2)
Alkaline Phosphatase: 127 U/L — ABNORMAL HIGH (ref 39–117)
BUN: 18 mg/dL (ref 6–23)
CALCIUM: 9.4 mg/dL (ref 8.4–10.5)
CO2: 35 meq/L — AB (ref 19–32)
CREATININE: 0.35 mg/dL — AB (ref 0.50–1.10)
Chloride: 96 mEq/L (ref 96–112)
GFR calc Af Amer: >90 mL/min (ref >90)
Glucose, Bld: 138 mg/dL — ABNORMAL HIGH (ref 70–99)
Potassium: 4.6 mEq/L (ref 3.7–5.3)
Sodium: 139 mEq/L (ref 137–147)
Total Bilirubin: 0.4 mg/dL (ref 0.3–1.2)
Total Protein: 7.1 g/dL (ref 6.0–8.3)

## 2014-08-10 LAB — CBC WITH DIFFERENTIAL/PLATELET
BASOS PCT: 0 % (ref 0–1)
Basophils Absolute: 0 10*3/uL (ref 0.0–0.1)
EOS PCT: 0 % (ref 0–5)
Eosinophils Absolute: 0 10*3/uL (ref 0.0–0.7)
HEMATOCRIT: 45.7 % (ref 36.0–46.0)
HEMOGLOBIN: 14 g/dL (ref 12.0–15.0)
Lymphocytes Relative: 5 % — ABNORMAL LOW (ref 12–46)
Lymphs Abs: 1 10*3/uL (ref 0.7–4.0)
MCH: 27.7 pg (ref 26.0–34.0)
MCHC: 30.6 g/dL (ref 30.0–36.0)
MCV: 90.3 fL (ref 78.0–100.0)
MONO ABS: 0.6 10*3/uL (ref 0.1–1.0)
MONOS PCT: 3 % (ref 3–12)
NEUTROS ABS: 19.6 10*3/uL — AB (ref 1.7–7.7)
Neutrophils Relative %: 92 % — ABNORMAL HIGH (ref 43–77)
Platelets: 290 10*3/uL (ref 150–400)
RBC: 5.06 MIL/uL (ref 3.87–5.11)
RDW: 13.6 % (ref 11.5–15.5)
WBC: 21.2 10*3/uL — ABNORMAL HIGH (ref 4.0–10.5)

## 2014-08-10 LAB — URINALYSIS, ROUTINE W REFLEX MICROSCOPIC
Glucose, UA: 250 mg/dL — AB
HGB URINE DIPSTICK: NEGATIVE
Ketones, ur: NEGATIVE mg/dL
Leukocytes, UA: NEGATIVE
Nitrite: NEGATIVE
Protein, ur: 100 mg/dL — AB
SPECIFIC GRAVITY, URINE: 1.037 — AB (ref 1.005–1.030)
Urobilinogen, UA: 1 mg/dL (ref 0.0–1.0)
pH: 5.5 (ref 5.0–8.0)

## 2014-08-10 LAB — I-STAT CHEM 8, ED
BUN: 24 mg/dL — ABNORMAL HIGH (ref 6–23)
CREATININE: 0.5 mg/dL (ref 0.50–1.10)
Calcium, Ion: 1.15 mmol/L (ref 1.13–1.30)
Chloride: 94 mEq/L — ABNORMAL LOW (ref 96–112)
GLUCOSE: 147 mg/dL — AB (ref 70–99)
HCT: 51 % — ABNORMAL HIGH (ref 36.0–46.0)
Hemoglobin: 17.3 g/dL — ABNORMAL HIGH (ref 12.0–15.0)
POTASSIUM: 4.6 meq/L (ref 3.7–5.3)
Sodium: 138 mEq/L (ref 137–147)
TCO2: 34 mmol/L (ref 0–100)

## 2014-08-10 LAB — I-STAT TROPONIN, ED: TROPONIN I, POC: 0 ng/mL (ref 0.00–0.08)

## 2014-08-10 LAB — URINE MICROSCOPIC-ADD ON

## 2014-08-10 LAB — I-STAT CG4 LACTIC ACID, ED: Lactic Acid, Venous: 2.06 mmol/L (ref 0.5–2.2)

## 2014-08-10 LAB — LIPASE, BLOOD: Lipase: 15 U/L (ref 11–59)

## 2014-08-10 MED ORDER — MORPHINE SULFATE 2 MG/ML IJ SOLN: 1.0000 mg | INTRAMUSCULAR | Status: DC | PRN

## 2014-08-10 MED ORDER — ACETAMINOPHEN 650 MG RE SUPP: 650.0000 mg | Freq: Four times a day (QID) | RECTAL | Status: DC | PRN

## 2014-08-10 MED ORDER — LEVALBUTEROL HCL 0.63 MG/3ML IN NEBU: 0.6300 mg | INHALATION_SOLUTION | Freq: Four times a day (QID) | RESPIRATORY_TRACT | Status: DC | PRN

## 2014-08-10 MED ORDER — IOHEXOL 300 MG/ML  SOLN: 25.0000 mL | INTRAMUSCULAR | Status: DC

## 2014-08-10 MED ORDER — METOPROLOL TARTRATE 1 MG/ML IV SOLN
5.0000 mg | Freq: Once | INTRAVENOUS | Status: AC
  Administered 2014-08-10: 5 mg via INTRAVENOUS
  Filled 2014-08-10: qty 5

## 2014-08-10 MED ORDER — DEXTROSE-NACL 5-0.9 % IV SOLN
INTRAVENOUS | Status: DC
  Administered 2014-08-10 – 2014-08-12 (×6): via INTRAVENOUS
  Administered 2014-08-13: 125 mL/h via INTRAVENOUS

## 2014-08-10 MED ORDER — PIPERACILLIN-TAZOBACTAM 3.375 G IVPB
3.3750 g | Freq: Three times a day (TID) | INTRAVENOUS | Status: DC
  Administered 2014-08-10 – 2014-08-12 (×8): 3.375 g via INTRAVENOUS
  Filled 2014-08-10 (×12): qty 50

## 2014-08-10 MED ORDER — ENOXAPARIN SODIUM 40 MG/0.4ML ~~LOC~~ SOLN
40.0000 mg | SUBCUTANEOUS | Status: DC
  Administered 2014-08-10: 40 mg via SUBCUTANEOUS
  Filled 2014-08-10: qty 0.4

## 2014-08-10 MED ORDER — MORPHINE SULFATE 2 MG/ML IJ SOLN
1.0000 mg | INTRAMUSCULAR | Status: DC | PRN
  Administered 2014-08-10 – 2014-08-13 (×12): 1 mg via INTRAVENOUS
  Filled 2014-08-10 (×12): qty 1

## 2014-08-10 MED ORDER — KETOROLAC TROMETHAMINE 15 MG/ML IJ SOLN
15.0000 mg | Freq: Four times a day (QID) | INTRAMUSCULAR | Status: DC
  Administered 2014-08-10 – 2014-08-14 (×13): 15 mg via INTRAVENOUS
  Filled 2014-08-10 (×21): qty 1

## 2014-08-10 MED ORDER — LEVALBUTEROL HCL 0.63 MG/3ML IN NEBU: 0.6300 mg | INHALATION_SOLUTION | RESPIRATORY_TRACT | Status: DC | PRN

## 2014-08-10 MED ORDER — PIPERACILLIN-TAZOBACTAM 3.375 G IVPB 30 MIN
3.3750 g | Freq: Once | INTRAVENOUS | Status: AC
  Administered 2014-08-10: 3.375 g via INTRAVENOUS
  Filled 2014-08-10: qty 50

## 2014-08-10 MED ORDER — ACETAMINOPHEN 500 MG PO TABS
1000.0000 mg | ORAL_TABLET | Freq: Once | ORAL | Status: AC
  Administered 2014-08-10: 500 mg via ORAL
  Filled 2014-08-10: qty 2

## 2014-08-10 MED ORDER — KCL IN DEXTROSE-NACL 20-5-0.9 MEQ/L-%-% IV SOLN
INTRAVENOUS | Status: DC
  Filled 2014-08-10: qty 1000

## 2014-08-10 MED ORDER — ONDANSETRON HCL 4 MG/2ML IJ SOLN: 4.0000 mg | Freq: Four times a day (QID) | INTRAMUSCULAR | Status: DC | PRN

## 2014-08-10 MED ORDER — LEVALBUTEROL HCL 0.63 MG/3ML IN NEBU
0.6300 mg | INHALATION_SOLUTION | Freq: Four times a day (QID) | RESPIRATORY_TRACT | Status: DC
  Administered 2014-08-10 – 2014-08-11 (×6): 0.63 mg via RESPIRATORY_TRACT
  Filled 2014-08-10 (×18): qty 3

## 2014-08-10 MED ORDER — ONDANSETRON HCL 4 MG PO TABS: 4.0000 mg | ORAL_TABLET | Freq: Four times a day (QID) | ORAL | Status: DC | PRN

## 2014-08-10 MED ORDER — IPRATROPIUM BROMIDE 0.02 % IN SOLN
0.5000 mg | Freq: Four times a day (QID) | RESPIRATORY_TRACT | Status: DC
  Administered 2014-08-10 – 2014-08-11 (×6): 0.5 mg via RESPIRATORY_TRACT
  Filled 2014-08-10 (×9): qty 2.5

## 2014-08-10 MED ORDER — MORPHINE SULFATE 4 MG/ML IJ SOLN: 4.0000 mg | Freq: Once | INTRAMUSCULAR | Status: DC

## 2014-08-10 MED ORDER — SODIUM CHLORIDE 0.9 % IV BOLUS (SEPSIS)
1000.0000 mL | Freq: Once | INTRAVENOUS | Status: AC
  Administered 2014-08-10: 1000 mL via INTRAVENOUS

## 2014-08-10 MED ORDER — DILTIAZEM HCL 100 MG IV SOLR
5.0000 mg/h | INTRAVENOUS | Status: DC
  Administered 2014-08-10: 5 mg/h via INTRAVENOUS
  Administered 2014-08-11: 15 mg/h via INTRAVENOUS
  Filled 2014-08-10: qty 100

## 2014-08-10 MED ORDER — SODIUM CHLORIDE 0.9 % IV SOLN
80.0000 mg | Freq: Once | INTRAVENOUS | Status: AC
  Administered 2014-08-10: 80 mg via INTRAVENOUS
  Filled 2014-08-10 (×2): qty 80

## 2014-08-10 MED ORDER — ONDANSETRON HCL 4 MG/2ML IJ SOLN
4.0000 mg | Freq: Once | INTRAMUSCULAR | Status: AC
  Administered 2014-08-10: 4 mg via INTRAVENOUS
  Filled 2014-08-10: qty 2

## 2014-08-10 MED ORDER — SODIUM CHLORIDE 0.9 % IV SOLN
INTRAVENOUS | Status: DC
  Administered 2014-08-10: 09:00:00 via INTRAVENOUS

## 2014-08-10 MED ORDER — ACETAMINOPHEN 325 MG PO TABS: 650.0000 mg | ORAL_TABLET | Freq: Four times a day (QID) | ORAL | Status: DC | PRN

## 2014-08-10 MED ORDER — HYDRALAZINE HCL 20 MG/ML IJ SOLN: 10.0000 mg | INTRAMUSCULAR | Status: DC | PRN

## 2014-08-10 NOTE — ED Notes (Signed)
Husband cell is [phone].

## 2014-08-10 NOTE — Progress Notes (Signed)
Pt decided against operative management.  She is unlikely to survive operative or non operative management.  Palliative care may be helpful at this point for goals of care.

## 2014-08-10 NOTE — ED Notes (Signed)
Surgeon at bedside speaking with pt and family.

## 2014-08-10 NOTE — H&P (Addendum)
Stacy Novak is an 73 y.o. female.   Chief Complaint: abd pain HPI: 12 yof who presents after recent hospital stay for copd being discharged last Wednesday.  She had palliative care discussed due to end stage nature of pulmonary disease.  She was on steroids in the hospital and discharged on prednisone.  She then developed diffuse abdominal pain that has worsened while at [facility].  She has some nausea. No emesis. Had bm.  Pain became progressive leading her to come to er.  She is baseline on 3 L of oxygen. Her functional status is poor. She is unable to walk very far.  She has significant weight loss and malnutrition that predate this er visit.  She has low albumin.She underwent plain films that shows pneumopertioneum with free air under the right hemidiaphagm. I was asked to see her. After introducing myself as a surgeon her first comment was that "I don't think I can survive surgery".  Past Medical History  Diagnosis Date  . 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 COPD (chronic obstructive pulmonary disease)   . Bronchiectasis   . Hypercalcemia   . GERD (gastroesophageal reflux disease)   . Hypertension   . Asthma     hx of PNA  . Adult failure to thrive 08/06/2012    20 pound weight loss one year, anorexia, pernicious anemia   . 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 CXR 01/23/12- again shows essentially stable severe fibro--cystic scarring with bronchiectasis and air fluid levels, but no obvious progression. Desat to 86% with exertion walking room air- order portable O2   . GERD   . Pernicious anemia     Past Surgical History  Procedure Laterality Date  . Tonsillectomy    . Lung biopsy      Family History  Problem Relation Age of Onset  . Hypertension    . Stroke Father   . Alzheimer's disease Mother   . Atrial fibrillation Mother   . Seizures Son    Social History:  reports  that she has never smoked. She has never used smokeless tobacco. She reports that she does not drink alcohol or use illicit drugs.  Allergies:  Allergies  Allergen Reactions  . Albuterol Anaphylaxis    Makes heart race  . Daliresp [Roflumilast] Other (See Comments)    Mental changes, feeling weird  . Doxycycline Other (See Comments)    Bad taste in mouth, "salty coating"  . Levaquin [Levofloxacin] Palpitations and Other (See Comments)    Joint pains/aches  . Other Other (See Comments)    DAIRY PRODUCTS-causes thick mucous secretions  . Sulfonamide Derivatives Nausea Only   meds reviewed, includes prednisone  Results for orders placed or performed during the hospital encounter of 08/02/2014 (from the past 48 hour(s))  CBC with Differential     Status: Abnormal   Collection Time: 08/10/14 12:02 AM  Result Value Ref Range   WBC 21.2 (H) 4.0 - 10.5 K/uL   RBC 5.06 3.87 - 5.11 MIL/uL   Hemoglobin 14.0 12.0 - 15.0 g/dL   HCT 45.7 36.0 - 46.0 %   MCV 90.3 78.0 - 100.0 fL   MCH 27.7 26.0 - 34.0 pg   MCHC 30.6 30.0 - 36.0 g/dL   RDW 13.6 11.5 - 15.5 %   Platelets 290 150 - 400 K/uL   Neutrophils Relative % 92 (H) 43 - 77 %   Neutro  Abs 19.6 (H) 1.7 - 7.7 K/uL   Lymphocytes Relative 5 (L) 12 - 46 %   Lymphs Abs 1.0 0.7 - 4.0 K/uL   Monocytes Relative 3 3 - 12 %   Monocytes Absolute 0.6 0.1 - 1.0 K/uL   Eosinophils Relative 0 0 - 5 %   Eosinophils Absolute 0.0 0.0 - 0.7 K/uL   Basophils Relative 0 0 - 1 %   Basophils Absolute 0.0 0.0 - 0.1 K/uL  Comprehensive metabolic panel     Status: Abnormal   Collection Time: 08/10/14 12:02 AM  Result Value Ref Range   Sodium 139 137 - 147 mEq/L   Potassium 4.6 3.7 - 5.3 mEq/L   Chloride 96 96 - 112 mEq/L   CO2 35 (H) 19 - 32 mEq/L   Glucose, Bld 138 (H) 70 - 99 mg/dL   BUN 18 6 - 23 mg/dL   Creatinine, Ser 0.35 (L) 0.50 - 1.10 mg/dL   Calcium 9.4 8.4 - 10.5 mg/dL   Total Protein 7.1 6.0 - 8.3 g/dL   Albumin 2.5 (L) 3.5 - 5.2 g/dL   AST  19 0 - 37 U/L   ALT 18 0 - 35 U/L   Alkaline Phosphatase 127 (H) 39 - 117 U/L   Total Bilirubin 0.4 0.3 - 1.2 mg/dL   GFR calc non Af Amer >90 >90 mL/min   GFR calc Af Amer >90 >90 mL/min    Comment: (NOTE) The eGFR has been calculated using the CKD EPI equation. This calculation has not been validated in all clinical situations. eGFR's persistently <90 mL/min signify possible Chronic Kidney Disease.    Anion gap 8 5 - 15  Lipase, blood     Status: None   Collection Time: 08/10/14 12:02 AM  Result Value Ref Range   Lipase 15 11 - 59 U/L  Urinalysis, Routine w reflex microscopic     Status: Abnormal   Collection Time: 08/10/14 12:21 AM  Result Value Ref Range   Color, Urine YELLOW YELLOW   APPearance CLEAR CLEAR   Specific Gravity, Urine 1.037 (H) 1.005 - 1.030   pH 5.5 5.0 - 8.0   Glucose, UA 250 (A) NEGATIVE mg/dL   Hgb urine dipstick NEGATIVE NEGATIVE   Bilirubin Urine SMALL (A) NEGATIVE   Ketones, ur NEGATIVE NEGATIVE mg/dL   Protein, ur 100 (A) NEGATIVE mg/dL   Urobilinogen, UA 1.0 0.0 - 1.0 mg/dL   Nitrite NEGATIVE NEGATIVE   Leukocytes, UA NEGATIVE NEGATIVE  Urine microscopic-add on     Status: Abnormal   Collection Time: 08/10/14 12:21 AM  Result Value Ref Range   Squamous Epithelial / LPF RARE RARE   WBC, UA 0-2 <3 WBC/hpf   Bacteria, UA RARE RARE   Crystals CA OXALATE CRYSTALS (A) NEGATIVE   Urine-Other MUCOUS PRESENT   I-stat troponin, ED     Status: None   Collection Time: 08/10/14 12:28 AM  Result Value Ref Range   Troponin i, poc 0.00 0.00 - 0.08 ng/mL   Comment 3            Comment: Due to the release kinetics of cTnI, a negative result within the first hours of the onset of symptoms does not rule out myocardial infarction with certainty. If myocardial infarction is still suspected, repeat the test at appropriate intervals.   I-Stat CG4 Lactic Acid, ED     Status: None   Collection Time: 08/10/14 12:30 AM  Result Value Ref Range   Lactic Acid,  Venous  2.06 0.5 - 2.2 mmol/L  I-stat chem 8, ed     Status: Abnormal   Collection Time: 08/10/14 12:30 AM  Result Value Ref Range   Sodium 138 137 - 147 mEq/L   Potassium 4.6 3.7 - 5.3 mEq/L   Chloride 94 (L) 96 - 112 mEq/L   BUN 24 (H) 6 - 23 mg/dL   Creatinine, Ser 0.50 0.50 - 1.10 mg/dL   Glucose, Bld 147 (H) 70 - 99 mg/dL   Calcium, Ion 1.15 1.13 - 1.30 mmol/L   TCO2 34 0 - 100 mmol/L   Hemoglobin 17.3 (H) 12.0 - 15.0 g/dL   HCT 51.0 (H) 36.0 - 46.0 %   Dg Chest 2 View  08/10/2014   CLINICAL DATA:  Acute onset of shortness of breath. Initial encounter.  EXAM: CHEST  2 VIEW  COMPARISON:  Chest radiograph performed 08/01/2014  FINDINGS: Extensive chronic lung disease is again noted, with numerous cystic spaces throughout both lungs, and associated air-fluid levels. Underlying diffuse interstitial prominence is seen. There is no definite evidence of superimposed acute focal airspace consolidation. No pleural effusion or pneumothorax identified.  The cardiomediastinal silhouette is normal in size. No acute osseous abnormalities are seen.  Note is made of free intra-abdominal air under the right hemidiaphragm.  IMPRESSION: 1. Free intra-abdominal air noted under the right hemidiaphragm. 2. Extensive chronic lung disease again noted, with numerous cystic spaces and associated air-fluid levels in both lungs. Underlying diffuse interstitial prominence seen. No definite superimposed focal airspace consolidation noted. Critical Value/emergent results were called by telephone at the time of interpretation on 08/10/2014 at 1:10 am to Dr. Everlene Balls, who verbally acknowledged these results.   Electronically Signed   By: Garald Balding M.D.   On: 08/10/2014 01:11   Dg Abd 2 Views  08/10/2014   CLINICAL DATA:  Low abdominal pain  EXAM: ABDOMEN - 2 VIEW  COMPARISON:  Chest radiograph same date and previous exams, dictated separately  FINDINGS: Evidence of chronic cystic lung disease reidentified with  internal air-fluid levels. Presence of free air beneath the right hemidiaphragm is noted. A few borderline dilated left lower quadrant loops of small bowel are identified, largest 3.1 cm in maximal diameter. No colonic dilatation. No acute osseous finding.  IMPRESSION: Free air beneath the right hemidiaphragm suggesting intestinal perforation.  A few borderline dilated left lower quadrant loops of small bowel could indicate obstruction.  Evolving cystic lung disease likely bronchiectasis with internal air-fluid levels.  Critical Value/emergent results were called by telephone at the time of interpretation on 08/10/2014 at 1:12 am to Dr. Everlene Balls , who verbally acknowledged these results.   Electronically Signed   By: Conchita Paris M.D.   On: 08/10/2014 01:12    Review of Systems  Constitutional: Positive for weight loss and malaise/fatigue. Negative for fever and chills.  Respiratory: Positive for cough and shortness of breath.   Cardiovascular: Negative for chest pain.  Gastrointestinal: Positive for nausea and abdominal pain. Negative for vomiting.    Blood pressure 136/66, pulse 127, temperature 97.9 F (36.6 C), temperature source Oral, resp. rate 29, height _0  (1.676 m), weight 128 lb (58.06 kg), SpO2 89 %. Physical Exam  Constitutional: She appears cachectic. She is cooperative.  HENT:  Head: Normocephalic and atraumatic.  Eyes: No scleral icterus.  Neck: Neck supple.  Cardiovascular: Regular rhythm.  Tachycardia present.   Respiratory: No respiratory distress.  Coarse bilateral breath sounds with occ rhonchi   GI: Normal appearance. She exhibits no  distension. Bowel sounds are decreased. There is tenderness (moderate tenderness throughout consistent with peritonitis). No hernia.     Assessment/Plan Pneumoperitoneum  Her husband and son were both present for this conversation. I have reviewed all her prior records on the computer.  I told all of them the only way I think  this is a survivable event with peritonitis is to proceed to the operating room.  She is high risk for variety of issues and my suspicion is a perforated ulcer.  She made the statement that she thinks she cannot survive surgery either and I agree with her.  The nsqip calculator puts her risk of death very high.  She at best I think would end up in a nursing facility again with significantly decreased quality of life.  She is interested in discussing nonoperative measures.  I told her that a ct scan might help to see if that was an option or might help [place] treat this in certain cases.  She says that she is unable to do it because she cannot lie down. She is adamant that is the case and we will not be able to do this.  I told all of them if we decided to proceed with nonoperative management she would likely not survive this event and Im not sure what that time course would be.  I recommended to she and her family that if she worsened I would not recommended escalation of care at that point including intubation, cpr. I would recommend palliative care.  I don't know if this will happen soon or if we would know over next 24 hours.  They are well aware that nonoperative mgt will likely lead to death.  They are also well aware of surgical risks and what that entails.  All three of them are comfortable with nonoperative mgt now.  She will get zosyn now.  I will let the medical drs know about her also.  Tiani Stanbery 08/10/2014, 2:11 AM

## 2014-08-10 NOTE — H&P (Signed)
[facility] History and Physical  Stacy Novak WUJ:[id number] DOB: Apr 29, 1941 DOA: 07/24/2014  Referring physician: ER physician. PCP: Rene Paci, MD  Chief Complaint: Abdominal pain.  HPI: Stacy Novak is a 73 y.o. female with history of chronic respiratory failure secondary to COPD and bronchiectasis was recently admitted for acute on chronic respiratory failure presents to the ER from [facility] because of abdominal pain. Patient states she has been having abdominal pain over the last 3 days which has progressively gotten worsen. In the ER x-rays reveal gas under the diaphragm concerning for perforation. On-call surgeon Dr. Dwain Sarna was consulted and at this time patient is not keen on getting any surgical procedure. Patient was explained that without surgery patient has high mortality. Patient has chronic respiratory failure and is mildly short of breath. On exam patient has diffuse abdominal tenderness. Denies any vomiting to as nausea.   Review of Systems: As presented in the history of presenting illness, rest negative.  Past Medical History  Diagnosis Date  . 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 COPD (chronic obstructive pulmonary disease)   . Bronchiectasis   . Hypercalcemia   . GERD (gastroesophageal reflux disease)   . Hypertension   . Asthma     hx of PNA  . Adult failure to thrive 08/06/2012    20 pound weight loss one year, anorexia, pernicious anemia   . 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 CXR 01/23/12- again shows essentially stable severe fibro--cystic scarring with bronchiectasis and air fluid levels, but no obvious progression. Desat to 86% with exertion walking room air- order portable O2   . GERD   . Pernicious anemia    Past Surgical History  Procedure Laterality Date  . Tonsillectomy    . Lung biopsy     Social History:  reports that she  has never smoked. She has never used smokeless tobacco. She reports that she does not drink alcohol or use illicit drugs. Where does patient live nursing home. Can patient participate in ADLs? No.  Allergies  Allergen Reactions  . Albuterol Anaphylaxis    Makes heart race  . Daliresp [Roflumilast] Other (See Comments)    Mental changes, feeling weird  . Doxycycline Other (See Comments)    Bad taste in mouth, "salty coating"  . Levaquin [Levofloxacin] Palpitations and Other (See Comments)    Joint pains/aches  . Other Other (See Comments)    DAIRY PRODUCTS-causes thick mucous secretions  . Sulfonamide Derivatives Nausea Only    Family History:  Family History  Problem Relation Age of Onset  . Hypertension    . Stroke Father   . Alzheimer's disease Mother   . Atrial fibrillation Mother   . Seizures Son       Prior to Admission medications   Medication Sig Start Date End Date Taking? Authorizing Provider  acetaminophen (TYLENOL) 500 MG tablet Take 500 mg by mouth every 6 (six) hours as needed for mild pain or moderate pain.   Yes Historical Provider, MD  calcium carbonate (TUMS - DOSED IN MG ELEMENTAL CALCIUM) 500 MG chewable tablet Chew 1 tablet by mouth daily as needed for indigestion or heartburn.   Yes Historical Provider, MD  cyanocobalamin (,VITAMIN B-12,) 1000 MCG/ML injection Inject 1 mL (1,000 mcg total) into the muscle every 30 (thirty) days. 11/13/12  Yes Newt Lukes, MD  diltiazem (CARDIZEM CD) 120 MG 24 hr capsule Take  1 capsule (120 mg total) by mouth daily. 02/04/14  Yes Newt LukesValerie A Leschber, MD  feeding supplement, ENSURE COMPLETE, (ENSURE COMPLETE) LIQD Take 237 mLs by mouth 3 (three) times daily with meals. 08/04/14  Yes Shanker Levora DredgeM Ghimire, MD  furosemide (LASIX) 20 MG tablet Take 20 mg by mouth as needed (swelling). 05/23/14  Yes Esperanza SheetsUlugbek N Buriev, MD  ipratropium (ATROVENT) 0.02 % nebulizer solution Take 0.5 mg by nebulization 3 (three) times daily as needed for  wheezing or shortness of breath.  01/15/14  Yes Marinda ElkAbraham Feliz Ortiz, MD  levalbuterol Pauline Aus(XOPENEX) 0.63 MG/3ML nebulizer solution Take 3 mLs (0.63 mg total) by nebulization every 3 (three) hours as needed for wheezing or shortness of breath. 08/04/14  Yes Shanker Levora DredgeM Ghimire, MD  pantoprazole (PROTONIX) 40 MG tablet Take 1 tablet (40 mg total) by mouth daily. 08/04/14  Yes Shanker Levora DredgeM Ghimire, MD  Polyethyl Glycol-Propyl Glycol (SYSTANE) 0.4-0.3 % SOLN Place 1 drop into both eyes daily as needed (for dry eyes).    Yes Historical Provider, MD  potassium chloride (K-DUR) 10 MEQ tablet Take 10 mEq by mouth as needed (takes only with lasix). 05/23/14  Yes Esperanza SheetsUlugbek N Buriev, MD  traMADol (ULTRAM) 50 MG tablet Take 1 tablet (50 mg total) by mouth every 6 (six) hours as needed. Patient taking differently: Take 25-50 mg by mouth every 8 (eight) hours as needed (pain).  08/04/14  Yes Shanker Levora DredgeM Ghimire, MD  azithromycin (ZITHROMAX) 500 MG tablet Take 1 tablet (500 mg total) by mouth daily. For 4 more days from 08/04/14 Patient not taking: Reported on 08/10/2014 08/04/14   Maretta BeesShanker M Ghimire, MD  levalbuterol Pauline Aus(XOPENEX) 0.63 MG/3ML nebulizer solution Take 3 mLs (0.63 mg total) by nebulization 3 (three) times daily. Patient not taking: Reported on 08/10/2014 08/04/14   Maretta BeesShanker M Ghimire, MD  predniSONE (DELTASONE) 5 MG tablet Label  & dispense according to the schedule below. 10 Pills PO for 3 days then, 8 Pills PO for 3 days, 6 Pills PO for 3 days, 4 Pills PO for 3 days, 2 Pills PO for 3 days, 1 Pills PO for 3 days, 1/2 Pill  PO for 3 days then STOP. Total 95 pills. 08/04/14   Maretta BeesShanker M Ghimire, MD    Physical Exam: Filed Vitals:   08/10/14 0230 08/10/14 0300 08/10/14 0330 08/10/14 0400  BP: 127/62 106/52 123/59 126/59  Pulse: 124 104 109 114  Temp:      TempSrc:      Resp: 31 24 28 27   Height:      Weight:      SpO2: 91% 91% 93% 94%     General:  Poorly built and nourished.  Eyes: Anicteric. Pallor.  ENT:  No discharge from the ears eyes nose and mouth.  Neck: No mass felt.  Cardiovascular: S1-S2 heard.  Respiratory: No rhonchi or crepitations.  Abdomen: Diffuse tenderness. Mild guarding and rigidity. Bowel sounds not appreciated.  Skin: No rash.  Musculoskeletal: No edema.  Psychiatric: Appears normal.  Neurologic: Alert awake oriented to time place and person. Moves all extremities.  Labs on Admission:  Basic Metabolic Panel:  Recent Labs Lab 08/10/14 0002 08/10/14 0030  NA 139 138  K 4.6 4.6  CL 96 94*  CO2 35*  --   GLUCOSE 138* 147*  BUN 18 24*  CREATININE 0.35* 0.50  CALCIUM 9.4  --    Liver Function Tests:  Recent Labs Lab 08/10/14 0002  AST 19  ALT 18  ALKPHOS 127*  BILITOT  0.4  PROT 7.1  ALBUMIN 2.5*    Recent Labs Lab 08/10/14 0002  LIPASE 15   No results for input(s): AMMONIA in the last 168 hours. CBC:  Recent Labs Lab 08/10/14 0002 08/10/14 0030  WBC 21.2*  --   NEUTROABS 19.6*  --   HGB 14.0 17.3*  HCT 45.7 51.0*  MCV 90.3  --   PLT 290  --    Cardiac Enzymes: No results for input(s): CKTOTAL, CKMB, CKMBINDEX, TROPONINI in the last 168 hours.  BNP (last 3 results)  Recent Labs  01/06/14 1746 01/12/14 0525 05/17/14 1936  PROBNP 1290.0* 528.2* 832.1*   CBG: No results for input(s): GLUCAP in the last 168 hours.  Radiological Exams on Admission: Dg Chest 2 View  08/10/2014   CLINICAL DATA:  Acute onset of shortness of breath. Initial encounter.  EXAM: CHEST  2 VIEW  COMPARISON:  Chest radiograph performed 08/01/2014  FINDINGS: Extensive chronic lung disease is again noted, with numerous cystic spaces throughout both lungs, and associated air-fluid levels. Underlying diffuse interstitial prominence is seen. There is no definite evidence of superimposed acute focal airspace consolidation. No pleural effusion or pneumothorax identified.  The cardiomediastinal silhouette is normal in size. No acute osseous abnormalities are  seen.  Note is made of free intra-abdominal air under the right hemidiaphragm.  IMPRESSION: 1. Free intra-abdominal air noted under the right hemidiaphragm. 2. Extensive chronic lung disease again noted, with numerous cystic spaces and associated air-fluid levels in both lungs. Underlying diffuse interstitial prominence seen. No definite superimposed focal airspace consolidation noted. Critical Value/emergent results were called by telephone at the time of interpretation on 08/10/2014 at 1:10 am to Dr. Tomasita Crumble, who verbally acknowledged these results.   Electronically Signed   By: Roanna Raider M.D.   On: 08/10/2014 01:11   Dg Abd 2 Views  08/10/2014   CLINICAL DATA:  Low abdominal pain  EXAM: ABDOMEN - 2 VIEW  COMPARISON:  Chest radiograph same date and previous exams, dictated separately  FINDINGS: Evidence of chronic cystic lung disease reidentified with internal air-fluid levels. Presence of free air beneath the right hemidiaphragm is noted. A few borderline dilated left lower quadrant loops of small bowel are identified, largest 3.1 cm in maximal diameter. No colonic dilatation. No acute osseous finding.  IMPRESSION: Free air beneath the right hemidiaphragm suggesting intestinal perforation.  A few borderline dilated left lower quadrant loops of small bowel could indicate obstruction.  Evolving cystic lung disease likely bronchiectasis with internal air-fluid levels.  Critical Value/emergent results were called by telephone at the time of interpretation on 08/10/2014 at 1:12 am to Dr. Tomasita Crumble , who verbally acknowledged these results.   Electronically Signed   By: Christiana Pellant M.D.   On: 08/10/2014 01:12    Assessment/Plan Principal Problem:   Peritonitis Active Problems:   HTN (hypertension)   Adult failure to thrive   Chronic respiratory failure   1. Peritonitis from perforated viscus with possible developing sepsis - I have discussed with on-call surgeon Dr. Dwain Sarna. At this  time patient is declining surgery and knows that doing so patient has high mortality. Patient has been kept nothing by mouth and on IV antibiotics and we will continue with gentle hydration. Patient also states she cannot lie flat for the CT abdomen. Continue pain relief medications. 2. Chronic respiratory failure with COPD and bronchiectasis - continue with nebulizer and patient is on empiric antibiotics for now. 3. Hypertension - patient is nothing by mouth and  I have placed patient on when necessary IV hydralazine. 4. Protein calorie malnutrition.  I have discussed with patient and patient's husband at the bedside. At this time patient wants to be having limited code with no intubation but okay with CPR. Palliative care consult requested for goals of care.  Code Status: Limited code with no intubation.  Family Communication: Patient's husband and son at the bedside.  Disposition Plan: Admit to inpatient.    Malashia Kamaka N. [facility] Pager [phone].  If 7PM-7AM, please contact night-coverage [email or web address] Password [email or web address] 08/10/2014, 4:28 AM

## 2014-08-10 NOTE — Progress Notes (Signed)
[name]  JORDANA DUGUE KYH:[id number] DOB: 1940-09-16 DOA: 08/03/2014 PCP: Gwendolyn Grant, MD  Brief narrative: 73 year old female patient with chronic respiratory failure in setting of COPD and recurrent bronchiectasis. Was recently admitted for acute on chronic respiratory failure and discharged to skilled nursing facility. She was sent over from the nursing facility because of abdominal pain for 3 days which had worsened.  In the ER abdominal films revealed gas under the diaphragm concerning for perforation. Surgery was consulted and after discussing with the patient and her family the patient decided not proceed with surgical evaluation. It was explained to the patient and her family that without surgery she had a high mortality rate and patient and husband verbalized understanding. In the ER her white count was 21,200 and her renal function was normal. She was diffusely tender on exam.  Since admission an extensive discussion was held with the patient. At admission patient was a partial code with our only limitation at that time being DO NOT INTUBATE. Discussed patient's poor prognosis and explained pathophysiology involved with untreated perforated bowel and likely progressing to life-threatening sepsis. Given the fact we were not going to treat these conditions it would not be in the patient's best interests to pursue aggressive resuscitation as outlined in current partial CODE STATUS. Patient's husband agreed that pursuing CPR and other aggressive measures despite not desiring to pursue intubation would cause discomfort and harm to the patient and agreed to a full DO NOT RESUSCITATE status. Later the patient's 2 sons presented to the bedside and this information was presented to them and they both agreed. During that conversation it was opted to continue conservative medical management for an additional 24 hours.  Later in the day on 12/21 palliative  medicine also met with the family. They seem to be leaning towards comfort measures but still wish to continue the aforementioned timeframe before discontinuing conservative medical management.   HPI/Subjective: Patient initially had been reluctant to receive pain medications wishing to "tough it out". Patient was given IV Toradol with improvement in pain; in addition she is now receptive to when necessary IV morphine noting she is now resting comfortably.  Assessment/Plan:  Perforated viscus with Peritonitis -Conservative approach with focus on pain management and comfort -Continue empiric antibiotics until 12/22 as well as supportive care with IV fluids and appropriate pain management -Appreciate general surgery assistance; per their note she is unlikely to survive either operative for nonoperative management.  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  Consultants: Palliative medicine General surgery  Procedures: None  Antibiotics: Zosyn 12/20 >  Objective: Blood pressure 130/72, pulse 137, temperature 98.4 F (36.9 C), temperature source Oral, resp. rate 26, height $RemoveBe'5\' 6"'diWBtTZDG$  (1.676 m), weight 126 lb 8.7 oz (57.4 kg), SpO2 91 %. No intake or output data in the 24 hours ending 08/10/14 1524  Exam: Patient admitted at 2:11 AM today; follow up exam completed  Scheduled Meds:  Scheduled Meds: . enoxaparin (LOVENOX) injection  40 mg Subcutaneous Q24H  . ipratropium  0.5 mg Nebulization Q6H  . ketorolac  15 mg Intravenous 4 times per day  . levalbuterol  0.63 mg Nebulization Q6H  . piperacillin-tazobactam (  ZOSYN)  IV  3.375 g Intravenous 3  times per day   Data Reviewed: Basic Metabolic Panel:  Recent Labs Lab 08/10/14 0002 08/10/14 0030  NA 139 138  K 4.6 4.6  CL 96 94*  CO2 35*  --   GLUCOSE 138* 147*  BUN 18 24*  CREATININE 0.35* 0.50  CALCIUM 9.4  --    Liver Function Tests:  Recent Labs Lab 08/10/14 0002  AST 19  ALT 18  ALKPHOS 127*  BILITOT 0.4  PROT 7.1  ALBUMIN 2.5*    Recent Labs Lab 08/10/14 0002  LIPASE 15   CBC:  Recent Labs Lab 08/10/14 0002 08/10/14 0030  WBC 21.2*  --   NEUTROABS 19.6*  --   HGB 14.0 17.3*  HCT 45.7 51.0*  MCV 90.3  --   PLT 290  --    BNP (last 3 results)  Recent Labs  01/06/14 1746 01/12/14 0525 05/17/14 1936  PROBNP 1290.0* 528.2* 832.1*   CBG:  Recent Labs Lab 08/10/14 0640 08/10/14 0726 08/10/14 1238  GLUCAP 112* 113* 83    Recent Results (from the past 240 hour(s))  MRSA PCR Screening     Status: None   Collection Time: 08/01/14  8:00 PM  Result Value Ref Range Status   MRSA by PCR NEGATIVE NEGATIVE Final    Comment:        The GeneXpert MRSA Assay (FDA approved for NASAL specimens only), is one component of a comprehensive MRSA colonization surveillance program. It is not intended to diagnose MRSA infection nor to guide or monitor treatment for MRSA infections.   Culture, blood (routine x 2)     Status: None   Collection Time: 08/01/14  9:45 PM  Result Value Ref Range Status   Specimen Description BLOOD RIGHT ANTECUBITAL  Final   Special Requests BOTTLES DRAWN AEROBIC AND ANAEROBIC 10CC EA  Final   Culture  Setup Time   Final    08/02/2014 04:08 Performed at [facility]    Culture   Final    NO GROWTH 5 DAYS Performed at [facility]    Report Status 08/08/2014 FINAL  Final  Culture, blood (routine x 2)     Status: None   Collection Time: 08/01/14 10:00 PM  Result Value Ref Range Status   Specimen Description BLOOD LEFT ANTECUBITAL  Final   Special Requests BOTTLES DRAWN AEROBIC AND ANAEROBIC  10CC EA  Final   Culture  Setup Time   Final    08/02/2014 04:08 Performed at [facility]   Final    NO GROWTH 5 DAYS Performed at [facility]    Report Status 08/08/2014 FINAL  Final     Studies:  Recent x-ray studies have been reviewed in detail by the Attending Physician  Time spent :  No charge  Erin Hearing, ANP [facility] Office  [phone] Pager [phone]  On-Call/Text Page:      [email or web address]      password [email or web address]  If 7PM-7AM, please contact night-coverage [email or web address] Password [email or web address] 08/10/2014, 3:24 PM   LOS: 1 day   I have personally examined this patient and reviewed the entire database. I have reviewed the above note, made any necessary editorial changes, and agree with its content.  Cherene Altes, MD [facility]

## 2014-08-10 NOTE — Progress Notes (Signed)
Patient arrived to floor about 0630. Patient was tachy and maintaining in the 130s-140s. RR in the 30s. Had 4L [place] but desated into the mid 80s when partially layed flat to remove linen. Had to be placed temporarily on NRB to bump up sats back into 90s. Switched to 6L [place] after and maintaing sats. Pt does not want to be layed flat or repositioned, is refusing pain medications- "all pain medicine makes me sick," pt moaning, states "dont let me die." Patient is limited code, wants everything done but intubation. Rapid response called and notified about situation. Stated they will come up to see her. Camila Lisman, PA paged at shift change and notified about ongoing situation. New orders given.  Day shift NP paged and updated on situation, stated she would come speak to family.

## 2014-08-10 NOTE — Progress Notes (Signed)
Utilization review complete. Nas Wafer RN CCM Case Mgmt phone [phone] 

## 2014-08-10 NOTE — Consult Note (Signed)
Patient Stacy Novak      DOB: Jan 29, 1941      YSA:[id number]     Consult Note from the Palliative Medicine Team at [facility] Requested by: Dr. Hal Hope     PCP: Gwendolyn Grant, MD Reason for Consultation: [place]     Phone [place]  Assessment of patients Current state: I met today with Mr. Kunda and sons [name] and [name] at bedside. Ms. Sturdivant was sedated likely due to pain medications. Mr. Richeson tells me that she wants to live and see Christmas and to see her grandchildren grow up more - unfortunately I do not believe that he is realistic about what is currently going on with his wife. Per RN patient had a conversation with Ebony Hail, NP this morning and decided DNR - I further discussed this with family. They tell me that they have a plan to try antibiotics for 24 hours and then transition to comfort measures if no change or worsening - I explained that it is unlikely that she will respond to this conservative treatment. I did clearly explain that prognosis is very poor. John tells me that she discussed with her PCP considering hospice care d/t her lung disease but she was not interested in having this conversation. John was very tearful and I believe he understands the gravity of the situation, [name] was very sleepy and nodding off, and Mr. Graciano does not seem to quite grasp the gravity and that the goals he mentioned earlier likely not possible at all. They agree to meet with me tomorrow to assess if there is any change - will likely need to discuss comfort and hospice options.    Goals of Care: 1.  Code Status: DNR   2. Scope of Treatment: Continue antibiotics/IV fluids with hope of improvement. DNR confirmed. No surgery desired.    4. Disposition: To be determined on outcomes.    3. Symptom Management:   1. Anxiety/Agitation: Consider low dose ativan as needed.  2. Abdominal Pain: Continue morphine 1 mg every hour as needed.   4. Psychosocial:  Emotional support provided to family at bedside. Ms. Teffeteller is sedated with pain medication.    Brief HPI: 73 yo female admitted from nursing facility (s/t recent admission for acute on chronic respiratory failure) with abdominal pain r/t perforated viscus with peritonitis. They have opted for no surgery and wish to attempt conservative management over the next 24 hours before transition to full comfort measures. PMH significant for COPD, bronchiectasis, hypercalcemia, HTN, GERD, asthma, pernicious anemia.    ROS: Unable to assess - sedated.     PMH:  Past Medical History  Diagnosis Date  . 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 COPD (chronic obstructive pulmonary disease)   . Bronchiectasis   . Hypercalcemia   . GERD (gastroesophageal reflux disease)   . Hypertension   . Asthma     hx of PNA  . Adult failure to thrive 08/06/2012    20 pound weight loss one year, anorexia, pernicious anemia   . 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 CXR 01/23/12- again shows essentially stable severe fibro--cystic scarring with bronchiectasis and air fluid levels, but no obvious progression. Desat to 86% with exertion walking room air- order portable O2   . GERD   . Pernicious anemia      PSH: Past Surgical History  Procedure Laterality Date  . Tonsillectomy    .  Lung biopsy     I have reviewed the [place] and SH and  If appropriate update it with new information. Allergies  Allergen Reactions  . Albuterol Anaphylaxis    Makes heart race  . Daliresp [Roflumilast] Other (See Comments)    Mental changes, feeling weird  . Doxycycline Other (See Comments)    Bad taste in mouth, "salty coating"  . Levaquin [Levofloxacin] Palpitations and Other (See Comments)    Joint pains/aches  . Other Other (See Comments)    DAIRY PRODUCTS-causes thick mucous secretions  . Sulfonamide Derivatives Nausea Only    Scheduled Meds: . enoxaparin (LOVENOX) injection  40 mg Subcutaneous Q24H  . ipratropium  0.5 mg Nebulization Q6H  . ketorolac  15 mg Intravenous 4 times per day  . levalbuterol  0.63 mg Nebulization Q6H  . piperacillin-tazobactam (ZOSYN)  IV  3.375 g Intravenous 3 times per day   Continuous Infusions: . sodium chloride 150 mL/hr at 08/10/14 1100   PRN Meds:.acetaminophen **OR** acetaminophen, hydrALAZINE, levalbuterol, morphine injection, ondansetron **OR** ondansetron (ZOFRAN) IV    BP 130/72 mmHg  Pulse 137  Temp(Src) 98.2 F (36.8 C) (Oral)  Resp 26  Ht _0  (1.676 m)  Wt 57.4 kg (126 lb 8.7 oz)  BMI 20.43 kg/m2  SpO2 92%   PPS: 10%   No intake or output data in the 24 hours ending 08/10/14 1233  Physical Exam:  General: NAD, sedated, thin, frail HEENT: [place]/AT, no JVD, moist mucous membranes Chest: No labored breathing, symmetric CVS: RRR, S1, S2 Abdomen: Did not assess d/t diffuse pain and tenderness per report Ext: No edema, warm to touch Neuro: Sedated with pain medication  Labs: CBC    Component Value Date/Time   WBC 21.2* 08/10/2014 0002   RBC 5.06 08/10/2014 0002   HGB 17.3* 08/10/2014 0030   HCT 51.0* 08/10/2014 0030   PLT 290 08/10/2014 0002   MCV 90.3 08/10/2014 0002   MCH 27.7 08/10/2014 0002   MCHC 30.6 08/10/2014 0002   RDW 13.6 08/10/2014 0002   LYMPHSABS 1.0 08/10/2014 0002   MONOABS 0.6 08/10/2014 0002   EOSABS 0.0 08/10/2014 0002   BASOSABS 0.0 08/10/2014 0002    BMET    Component Value Date/Time   NA 138 08/10/2014 0030   K 4.6 08/10/2014 0030   CL 94* 08/10/2014 0030   CO2 35* 08/10/2014 0002   GLUCOSE 147* 08/10/2014 0030   BUN 24* 08/10/2014 0030   CREATININE 0.50 08/10/2014 0030   CALCIUM 9.4 08/10/2014 0002   GFRNONAA >90 08/10/2014 0002   GFRAA >90 08/10/2014 0002    CMP     Component Value Date/Time   NA 138 08/10/2014 0030   K 4.6 08/10/2014 0030   CL 94* 08/10/2014 0030   CO2 35* 08/10/2014 0002   GLUCOSE  147* 08/10/2014 0030   BUN 24* 08/10/2014 0030   CREATININE 0.50 08/10/2014 0030   CALCIUM 9.4 08/10/2014 0002   PROT 7.1 08/10/2014 0002   ALBUMIN 2.5* 08/10/2014 0002   AST 19 08/10/2014 0002   ALT 18 08/10/2014 0002   ALKPHOS 127* 08/10/2014 0002   BILITOT 0.4 08/10/2014 0002   GFRNONAA >90 08/10/2014 0002   GFRAA >90 08/10/2014 0002     Time In Time Out Total Time Spent with Patient Total Overall Time  1145 1245 83mn 651m    Greater than 50%  of this time was spent counseling and coordinating care related to the above assessment and plan.  AlVinie SillNP Palliative  Medicine Team Pager # [phone] (M-F 8a-5p) Team Phone # [phone] (Nights/Weekends)

## 2014-08-10 NOTE — Progress Notes (Signed)
73yo female recently discharged for COPD exacerbation now c/o diffuse abdominal pain w/ diarrhea since Friday, concern for peritonitis on CXR but pt refuses CT scan to confirm and wishes to proceed w/ non-operative measures only.  Will begin Zosyn 3.375g IV Q8H for CrCl ~3355ml/min and monitor CBC and Cx.  Vernard GamblesVeronda Iridessa Harrow, PharmD, BCPS 08/10/2014 6:43 AM

## 2014-08-11 DIAGNOSIS — J9621 Acute and chronic respiratory failure with hypoxia: Secondary | ICD-10-CM

## 2014-08-11 DIAGNOSIS — A310 Pulmonary mycobacterial infection: Secondary | ICD-10-CM | POA: Insufficient documentation

## 2014-08-11 DIAGNOSIS — J471 Bronchiectasis with (acute) exacerbation: Secondary | ICD-10-CM

## 2014-08-11 DIAGNOSIS — R1084 Generalized abdominal pain: Secondary | ICD-10-CM

## 2014-08-11 DIAGNOSIS — R69 Illness, unspecified: Secondary | ICD-10-CM

## 2014-08-11 DIAGNOSIS — R198 Other specified symptoms and signs involving the digestive system and abdomen: Secondary | ICD-10-CM | POA: Insufficient documentation

## 2014-08-11 LAB — COMPREHENSIVE METABOLIC PANEL
ALT: 15 U/L (ref 0–35)
AST: 14 U/L (ref 0–37)
Albumin: 1.8 g/dL — ABNORMAL LOW (ref 3.5–5.2)
Alkaline Phosphatase: 82 U/L (ref 39–117)
Anion gap: 5 (ref 5–15)
BUN: 21 mg/dL (ref 6–23)
CALCIUM: 7.9 mg/dL — AB (ref 8.4–10.5)
CO2: 32 mmol/L (ref 19–32)
CREATININE: 0.41 mg/dL — AB (ref 0.50–1.10)
Chloride: 103 mEq/L (ref 96–112)
GFR calc Af Amer: >90 mL/min (ref >90)
GFR calc non Af Amer: >90 mL/min (ref >90)
Glucose, Bld: 109 mg/dL — ABNORMAL HIGH (ref 70–99)
Potassium: 4.2 mmol/L (ref 3.5–5.1)
Sodium: 140 mmol/L (ref 135–145)
Total Bilirubin: 0.6 mg/dL (ref 0.3–1.2)
Total Protein: 5.1 g/dL — ABNORMAL LOW (ref 6.0–8.3)

## 2014-08-11 LAB — CBC WITH DIFFERENTIAL/PLATELET
BASOS ABS: 0 10*3/uL (ref 0.0–0.1)
Basophils Relative: 0 % (ref 0–1)
EOS ABS: 0.1 10*3/uL (ref 0.0–0.7)
EOS PCT: 0 % (ref 0–5)
HEMATOCRIT: 39.1 % (ref 36.0–46.0)
Hemoglobin: 11.5 g/dL — ABNORMAL LOW (ref 12.0–15.0)
Lymphocytes Relative: 4 % — ABNORMAL LOW (ref 12–46)
Lymphs Abs: 0.6 10*3/uL — ABNORMAL LOW (ref 0.7–4.0)
MCH: 27.1 pg (ref 26.0–34.0)
MCHC: 29.4 g/dL — AB (ref 30.0–36.0)
MCV: 92.2 fL (ref 78.0–100.0)
Monocytes Absolute: 0.6 10*3/uL (ref 0.1–1.0)
Monocytes Relative: 5 % (ref 3–12)
Neutro Abs: 11.9 10*3/uL — ABNORMAL HIGH (ref 1.7–7.7)
Neutrophils Relative %: 91 % — ABNORMAL HIGH (ref 43–77)
Platelets: 255 10*3/uL (ref 150–400)
RBC: 4.24 MIL/uL (ref 3.87–5.11)
RDW: 13.9 % (ref 11.5–15.5)
WBC: 13.1 10*3/uL — ABNORMAL HIGH (ref 4.0–10.5)

## 2014-08-11 LAB — GLUCOSE, CAPILLARY
GLUCOSE-CAPILLARY: 152 mg/dL — AB (ref 70–99)
Glucose-Capillary: 107 mg/dL — ABNORMAL HIGH (ref 70–99)
Glucose-Capillary: 134 mg/dL — ABNORMAL HIGH (ref 70–99)
Glucose-Capillary: 69 mg/dL — ABNORMAL LOW (ref 70–99)

## 2014-08-11 LAB — TSH: TSH: 1.375 u[IU]/mL (ref 0.350–4.500)

## 2014-08-11 LAB — D-DIMER, QUANTITATIVE (NOT AT ARMC): D DIMER QUANT: 7.35 ug{FEU}/mL — AB (ref 0.00–0.48)

## 2014-08-11 LAB — APTT: APTT: 28 s (ref 24–37)

## 2014-08-11 LAB — PROTIME-INR
INR: 1.13 (ref 0.00–1.49)
PROTHROMBIN TIME: 14.6 s (ref 11.6–15.2)

## 2014-08-11 LAB — TROPONIN I
TROPONIN I: 0.04 ng/mL — AB (ref <0.031)
Troponin I: <0.03 ng/mL (ref <0.031)

## 2014-08-11 MED ORDER — CETYLPYRIDINIUM CHLORIDE 0.05 % MT LIQD
7.0000 mL | Freq: Two times a day (BID) | OROMUCOSAL | Status: DC
  Administered 2014-08-11 – 2014-08-13 (×4): 7 mL via OROMUCOSAL

## 2014-08-11 MED ORDER — DM-GUAIFENESIN ER 30-600 MG PO TB12
1.0000 | ORAL_TABLET | Freq: Two times a day (BID) | ORAL | Status: DC
  Filled 2014-08-11 (×5): qty 1

## 2014-08-11 MED ORDER — ACETYLCYSTEINE 20 % IN SOLN
70.0000 mg/kg | Freq: Four times a day (QID) | RESPIRATORY_TRACT | Status: DC
  Filled 2014-08-11 (×3): qty 30

## 2014-08-11 MED ORDER — METOPROLOL TARTRATE 1 MG/ML IV SOLN
5.0000 mg | INTRAVENOUS | Status: DC
  Administered 2014-08-11 – 2014-08-13 (×10): 5 mg via INTRAVENOUS
  Filled 2014-08-11 (×18): qty 5

## 2014-08-11 MED ORDER — ACETYLCYSTEINE 20 % IN SOLN
4.0000 mL | Freq: Four times a day (QID) | RESPIRATORY_TRACT | Status: DC
  Filled 2014-08-11 (×13): qty 4

## 2014-08-11 NOTE — Progress Notes (Signed)
Attempted report 

## 2014-08-11 NOTE — Progress Notes (Signed)
Report [facility] to [facility]. Family at bedside and aware of pt transfer.

## 2014-08-11 NOTE — Progress Notes (Signed)
[name]  Stacy Novak XBJ:[id number] DOB: Mar 29, 1941 DOA: 07/26/2014 PCP: Gwendolyn Grant, MD  Brief narrative: 73 year old WF PMHx chronic respiratory failure in setting of COPD and recurrent bronchiectasis on 3 L O2 via [place] at home. Was recently admitted for acute on chronic respiratory failure and discharged to skilled nursing facility. She was sent over from the nursing facility because of abdominal pain for 3 days which had worsened.  In the ER abdominal films revealed gas under the diaphragm concerning for perforation. Surgery was consulted and after discussing with the patient and her family the patient decided not proceed with surgical evaluation. It was explained to the patient and her family that without surgery she had a high mortality rate and patient and husband verbalized understanding. In the ER her white count was 21,200 and her renal function was normal. She was diffusely tender on exam.  Since admission an extensive discussion was held with the patient. At admission patient was a partial code with our only limitation at that time being DO NOT INTUBATE. Discussed patient's poor prognosis and explained pathophysiology involved with untreated perforated bowel and likely progressing to life-threatening sepsis. Given the fact we were not going to treat these conditions it would not be in the patient's best interests to pursue aggressive resuscitation as outlined in current partial CODE STATUS. Patient's husband agreed that pursuing CPR and other aggressive measures despite not desiring to pursue intubation would cause discomfort and harm to the patient and agreed to a full DO NOT RESUSCITATE status. Later the patient's 2 sons presented to the bedside and this information was presented to them and they both agreed. During that conversation it was opted to continue conservative medical management for an additional 24 hours.  Later in the day on  12/21 palliative medicine also met with the family. They seem to be leaning towards comfort measures but still wish to continue the aforementioned timeframe before discontinuing conservative medical management.  As of 12/22 husband encouraged by patients improved mentation so reluctant to dc anbx's at this juncture so will continue for now.  HPI/Subjective: Patient more alert today and denies any pain until abdomen palpated then clearly observed to have significant pain-patient continues to minimize pain and frequently refuses narcotic pain medications  Assessment/Plan:  Perforated viscus with Peritonitis -Conservative approach with focus on pain management and comfort -Continue empiric antibiotics for now -Continue D5 -0.9% normal saline at 125 ml/hr -Patient and family understand that perforation may spontaneously seal, however if it does not would need to proceed to comfort care as surgery is not an option   Atrial fibrillation with RVR -Directly related to under lying perforated bowel and evolving sepsis as well as pain -Discontinue Cardizem drip in favor of scheduled Lopressor with hold parameters for systolic blood pressure less than or equal to 85 and heart rate less than or equal to 55 -No indication to pursue 2-D echocardiogram -Currently maintaining sinus tachycardia  HTN  -See A. 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 Rolm Bookbinder  Procedures: None  Antibiotics: Zosyn 12/20 >  Objective: Blood pressure 117/54, pulse 99, temperature 98.4 F (36.9 C), temperature source Oral, resp. rate 29, height _0  (1.676 m), weight 126 lb 8.7 oz (57.4 kg), SpO2 96 %.  Intake/Output Summary (Last 24 hours) at 08/11/14 1424 Last data filed at 08/11/14 1200  Gross per 24 hour  Intake 2335.66 ml  Output    700 ml  Net 1635.66 ml    Exam: Gen: No acute respiratory distress-much more alert today and communicative Chest: Diffuse rhonchi, negative wheezes, or crackles. Diminished lung sounds due to patient's splinting from abdominal pain, room air Cardiac: Regular rate and rhythm, S1-S2, no rubs murmurs or gallops, no peripheral edema, no JVD Abdomen: Distended and firm and diffusely tender with guarding and rebounding, bowel sounds absent Extremities: Symmetrical in appearance without cyanosis, clubbing or effusion  Scheduled Meds:  Scheduled Meds: . antiseptic oral rinse  7 mL Mouth Rinse BID  . ipratropium  0.5 mg Nebulization Q6H  . ketorolac  15 mg Intravenous 4 times per day  . levalbuterol  0.63 mg Nebulization Q6H  . metoprolol  5 mg Intravenous Q4H  . piperacillin-tazobactam (ZOSYN)  IV  3.375 g Intravenous 3 times per day   Data Reviewed: Basic Metabolic Panel:  Recent Labs Lab 08/10/14 0002 08/10/14 0030 08/10/14 2220  NA 139 138 140  K 4.6 4.6 4.2  CL 96 94* 103  CO2 35*  --  32  GLUCOSE 138* 147* 109*  BUN 18 24* 21  CREATININE 0.35* 0.50 0.41*  CALCIUM 9.4  --  7.9*   Liver Function Tests:  Recent Labs Lab 08/10/14 0002 08/10/14 2220  AST 19 14  ALT 18 15  ALKPHOS 127* 82  BILITOT 0.4 0.6  PROT 7.1 5.1*  ALBUMIN  2.5* 1.8*    Recent Labs Lab 08/10/14 0002  LIPASE 15   CBC:  Recent Labs Lab 08/10/14 0002 08/10/14 0030 08/10/14 2220  WBC 21.2*  --  13.1*  NEUTROABS 19.6*  --  11.9*  HGB 14.0 17.3* 11.5*  HCT 45.7 51.0* 39.1  MCV 90.3  --  92.2  PLT 290  --  255   BNP (last 3 results)  Recent Labs  01/06/14 1746 01/12/14 0525 05/17/14 1936  PROBNP 1290.0* 528.2* 832.1*   CBG:  Recent Labs Lab 08/10/14 0726 08/10/14 1238 08/10/14 1645 08/11/14 0001 08/11/14 0658  GLUCAP 113* 83 69* 107* 152*    Recent Results (from the past 240 hour(s))  MRSA PCR Screening     Status: None   Collection Time: 08/01/14  8:00 PM  Result Value Ref Range Status   MRSA by PCR NEGATIVE NEGATIVE Final    Comment:        The GeneXpert MRSA Assay (FDA approved for NASAL specimens only), is one component of a comprehensive MRSA colonization surveillance program. It is not intended to diagnose MRSA infection nor to guide or monitor treatment for MRSA infections.   Culture, blood (routine x 2)     Status: None   Collection Time: 08/01/14  9:45 PM  Result Value Ref Range Status   Specimen Description BLOOD RIGHT ANTECUBITAL  Final   Special Requests BOTTLES  DRAWN AEROBIC AND ANAEROBIC 10CC EA  Final   Culture  Setup Time   Final    08/02/2014 04:08 Performed at [facility]    Culture   Final    NO GROWTH 5 DAYS Performed at [facility]    Report Status 08/08/2014 FINAL  Final  Culture, blood (routine x 2)     Status: None   Collection Time: 08/01/14 10:00 PM  Result Value Ref Range Status   Specimen Description BLOOD LEFT ANTECUBITAL  Final   Special Requests BOTTLES DRAWN AEROBIC AND ANAEROBIC 10CC EA  Final   Culture  Setup Time   Final    08/02/2014 04:08 Performed at [facility]    Culture   Final    NO GROWTH 5 DAYS Performed at [facility]    Report Status 08/08/2014 FINAL  Final  Urine culture     Status: None (Preliminary result)    Collection Time: 08/10/14 12:21 AM  Result Value Ref Range Status   Specimen Description URINE, CLEAN CATCH  Final   Special Requests NONE  Final   Culture  Setup Time   Final    08/10/2014 09:48 Performed at [place]   Final    55,000 COLONIES/ML Performed at [facility]    Culture   Final    STAPHYLOCOCCUS SPECIES Note: RIFAMPIN AND GENTAMICIN SHOULD NOT BE USED AS SINGLE DRUGS FOR TREATMENT OF STAPH INFECTIONS. Performed at [facility]    Report Status PENDING  Incomplete  Blood Culture (routine x 2)     Status: None (Preliminary result)   Collection Time: 08/10/14  2:42 AM  Result Value Ref Range Status   Specimen Description BLOOD RIGHT HAND  Final   Special Requests BOTTLES DRAWN AEROBIC AND ANAEROBIC 5CC EA  Final   Culture  Setup Time   Final    08/10/2014 10:18 Performed at [facility]    Culture   Final           BLOOD CULTURE RECEIVED NO GROWTH TO DATE CULTURE WILL BE HELD FOR 5 DAYS BEFORE ISSUING A FINAL NEGATIVE REPORT Performed at [facility]    Report Status PENDING  Incomplete  Blood Culture (routine x 2)     Status: None (Preliminary result)   Collection Time: 08/10/14  2:50 AM  Result Value Ref Range Status   Specimen Description BLOOD LEFT HAND  Final   Special Requests BOTTLES DRAWN AEROBIC AND ANAEROBIC 5CC EA  Final   Culture  Setup Time   Final    08/10/2014 10:18 Performed at [facility]    Culture   Final           BLOOD CULTURE RECEIVED NO GROWTH TO DATE CULTURE WILL BE HELD FOR 5 DAYS BEFORE ISSUING A FINAL NEGATIVE REPORT Performed at [facility]    Report Status PENDING  Incomplete     Studies:  Recent x-ray studies have been reviewed in detail by the Attending Physician  Time spent :  No charge  Erin Hearing, [place] [facility] Office  [phone] Pager [phone]  On-Call/Text Page:      [email or web address]      password [email or web address]  If 7PM-7AM,  please contact night-coverage [email or web address] Password [email or web address] 08/11/2014, 2:24 PM   LOS: 2 days   Examined Patient and discussed A&P with ANP Ebony Hail and agree with above plan.  I have reviewed the entire database. I have  made any necessary editorial changes, and agree with its content. Pt with Multiple Complex medical problems> 50 min spent in direct Pt care    Dia Crawford, MD [facility] [phone] pager

## 2014-08-11 NOTE — Progress Notes (Signed)
Pt was assisted with the bedpan about 2050, became markedly tachycardic (180s-200s), dyspneic, and converted into atrial fibrillation at approximately 2053. MD notified of these changes, in addition to new crackles auscultated on 2000 assessment. Received orders to start Cardizem drip and decrease IVF rate from 125 to 3750ml/hr. Spoke again with MD at about 0130, regarding HR in the 140s-150s at the max drip rate ordered. In addition, notified MD of BP of 95/49. Received orders to increase IVF rate to 13325ml/hr and maintain Cardizem drip rate. Pt converted from atrial fibrillation back into sinus tachycardia at approximately 0202, per CCMD.

## 2014-08-12 DIAGNOSIS — R1013 Epigastric pain: Secondary | ICD-10-CM

## 2014-08-12 DIAGNOSIS — R627 Adult failure to thrive: Secondary | ICD-10-CM

## 2014-08-12 LAB — GLUCOSE, CAPILLARY
GLUCOSE-CAPILLARY: 131 mg/dL — AB (ref 70–99)
Glucose-Capillary: 127 mg/dL — ABNORMAL HIGH (ref 70–99)
Glucose-Capillary: 131 mg/dL — ABNORMAL HIGH (ref 70–99)

## 2014-08-12 LAB — URINE CULTURE

## 2014-08-12 MED ORDER — LORAZEPAM 2 MG/ML IJ SOLN
1.0000 mg | Freq: Once | INTRAMUSCULAR | Status: AC
  Administered 2014-08-12: 1 mg via INTRAVENOUS
  Filled 2014-08-12: qty 1

## 2014-08-12 NOTE — Clinical Social Work Psychosocial (Signed)
Clinical Social Work Department BRIEF PSYCHOSOCIAL ASSESSMENT 08/12/2014  Patient:  [name],Leyanna B     Account Number:  [id number]     Admit date:  08/11/2014  Clinical Social Worker:  Merlyn LotHOLOMAN,Mariellen Blaney, CLINICAL SOCIAL WORKER  Date/Time:  08/12/2014 10:00 AM  Referred by:  RN  Date Referred:  08/12/2014 Referred for  SNF Placement   Other Referral:   Interview type:  Family Other interview type:    PSYCHOSOCIAL DATA Living Status:  FACILITY Admitted from facility:  [facility] Level of care:  Skilled Nursing Facility Primary support name:  Lyda JesterCurtis Primary support relationship to patient:  SPOUSE Degree of support available:   Patients husband reports high level of support- husband and friend at bedside.    CURRENT CONCERNS Current Concerns  Post-Acute Placement   Other Concerns:    SOCIAL WORK ASSESSMENT / PLAN CSW [facility] patients [facility]  Patients husband reports that patient is unlikely to be healthy enough [facility] and that he was told hospital death is probable.   Assessment/plan status:  Psychosocial Support/Ongoing Assessment of Needs Other assessment/ plan:   none   Information/referral to community resources:   none    PATIENT'S/FAMILY'S RESPONSE TO PLAN OF CARE: Patients husband does not think that a return to SNF is probable based off of Dr. reports- he is hopeful for a comfortable stay in the hospital.       Merlyn LotJenna Holoman, [facility] Orthopaedic InstituteCSWA Clinical Social Worker [phone]

## 2014-08-12 NOTE — Progress Notes (Signed)
Progress Note from the Palliative Medicine Team at [facility]: I met with Stacy Novak who is more alert today and able to converse with me. Her husband and son, Stacy Novak, at bedside. Stacy Novak tells me that her pain is controlled with the medicine. I asked her what she was worried about and she tells me dying - leaving her family. I explained that her perforation will likely lead to her death and she tells me that she wants to be here through Christmas. I reassured her that we will help her be comfortable and not in pain and that we will be there to help support her family as well.  I do believe that Stacy Novak has a false hope because she is more alert this morning. This is a difficult situation in which Stacy Novak clearly wants to continue to live but she is clear she would not want surgery as "I would not live through it." I will continue to follow and support.      Objective: Allergies  Allergen Reactions  . Albuterol Anaphylaxis    Makes heart race  . Daliresp [Roflumilast] Other (See Comments)    Mental changes, feeling weird  . Doxycycline Other (See Comments)    Bad taste in mouth, "salty coating"  . Levaquin [Levofloxacin] Palpitations and Other (See Comments)    Joint pains/aches  . Other Other (See Comments)    DAIRY PRODUCTS-causes thick mucous secretions  . Sulfonamide Derivatives Nausea Only   Scheduled Meds: . acetylcysteine  4 mL Nebulization Q6H  . antiseptic oral rinse  7 mL Mouth Rinse BID  . dextromethorphan-guaiFENesin  1 tablet Oral BID  . ipratropium  0.5 mg Nebulization Q6H  . ketorolac  15 mg Intravenous 4 times per day  . levalbuterol  0.63 mg Nebulization Q6H  . metoprolol  5 mg Intravenous Q4H  . piperacillin-tazobactam (ZOSYN)  IV  3.375 g Intravenous 3 times per day   Continuous Infusions: . dextrose 5 % and 0.9% NaCl 125 mL/hr at 08/12/14 0350   PRN Meds:.acetaminophen **OR** acetaminophen, hydrALAZINE, levalbuterol, morphine  injection, ondansetron **OR** ondansetron (ZOFRAN) IV  BP 161/72 mmHg  Pulse 104  Temp(Src) 97.3 F (36.3 C) (Axillary)  Resp 20  Ht $R'5\' 6"'wa$  (1.676 m)  Wt 57.4 kg (126 lb 8.7 oz)  BMI 20.43 kg/m2  SpO2 94%   PPS: 20%   Intake/Output Summary (Last 24 hours) at 08/12/14 0819 Last data filed at 08/12/14 0600  Gross per 24 hour  Intake   1982 ml  Output    400 ml  Net   1582 ml      LBM: 08/07/14  Physical Exam:  General: NAD, sedated, thin, frail HEENT: [place]/AT, no JVD, moist mucous membranes Chest: No labored breathing, symmetric CVS: RRR, S1, S2 Abdomen: Diffuse pain to soft touch Ext: No edema, warm to touch Neuro: Awake, alert, oriented x 3   Labs: CBC    Component Value Date/Time   WBC 13.1* 08/10/2014 2220   RBC 4.24 08/10/2014 2220   HGB 11.5* 08/10/2014 2220   HCT 39.1 08/10/2014 2220   PLT 255 08/10/2014 2220   MCV 92.2 08/10/2014 2220   MCH 27.1 08/10/2014 2220   MCHC 29.4* 08/10/2014 2220   RDW 13.9 08/10/2014 2220   LYMPHSABS 0.6* 08/10/2014 2220   MONOABS 0.6 08/10/2014 2220   EOSABS 0.1 08/10/2014 2220   BASOSABS 0.0 08/10/2014 2220    BMET    Component Value Date/Time   NA 140 08/10/2014 2220  K 4.2 08/10/2014 2220   CL 103 08/10/2014 2220   CO2 32 08/10/2014 2220   GLUCOSE 109* 08/10/2014 2220   BUN 21 08/10/2014 2220   CREATININE 0.41* 08/10/2014 2220   CALCIUM 7.9* 08/10/2014 2220   GFRNONAA >90 08/10/2014 2220   GFRAA >90 08/10/2014 2220    CMP     Component Value Date/Time   NA 140 08/10/2014 2220   K 4.2 08/10/2014 2220   CL 103 08/10/2014 2220   CO2 32 08/10/2014 2220   GLUCOSE 109* 08/10/2014 2220   BUN 21 08/10/2014 2220   CREATININE 0.41* 08/10/2014 2220   CALCIUM 7.9* 08/10/2014 2220   PROT 5.1* 08/10/2014 2220   ALBUMIN 1.8* 08/10/2014 2220   AST 14 08/10/2014 2220   ALT 15 08/10/2014 2220   ALKPHOS 82 08/10/2014 2220   BILITOT 0.6 08/10/2014 2220   GFRNONAA >90 08/10/2014 2220   GFRAA >90 08/10/2014 2220     Assessment and Plan: 1. Code Status: DNR - confirmed with patient.  2. Symptom Control: 1. Anxiety/Agitation: Consider low dose ativan as needed.  2. Abdominal Pain: Continue morphine 1 mg every hour as needed.  3. Psycho/Social: Emotional support provided to patient and family.  4. Disposition: To be determined on outcomes.     Time In Time Out Total Time Spent with Patient Total Overall Time  1130 1150 60min 13min    Greater than 50%  of this time was spent counseling and coordinating care related to the above assessment and plan.  Vinie Sill, NP Palliative Medicine Team Pager # [phone] (M-F 8a-5p) Team Phone # [phone] (Nights/Weekends)

## 2014-08-12 NOTE — Progress Notes (Signed)
Stacy Novak - Stepdown / ICU Progress Note  Stacy Novak FXT:[id number] DOB: 04/29/1941 DOA: 07/22/2014 PCP: Gwendolyn Grant, MD  Brief narrative: 73 year old WF PMHx chronic respiratory failure in setting of COPD and recurrent bronchiectasis on 3 L O2 via [place] at home. Was recently admitted for acute on chronic respiratory failure and discharged to skilled nursing facility. She was sent over from the nursing facility because of abdominal pain for 3 days which had worsened.  In the ER abdominal films revealed gas under the diaphragm concerning for perforation. Surgery was consulted and after discussing with the patient and her family the patient decided not proceed with surgical evaluation. It was explained to the patient and her family that without surgery she had a high mortality rate and patient and husband verbalized understanding. In the ER her white count was 21,200 and her renal function was normal. She was diffusely tender on exam.  Since admission an extensive discussion was held with the patient. At admission patient was a partial code with our only limitation at that time being DO NOT INTUBATE. Discussed patient's poor prognosis and explained pathophysiology involved with untreated perforated bowel and likely progressing to life-threatening sepsis. Given the fact we were not going to treat these conditions it would not be in the patient's best interests to pursue aggressive resuscitation as outlined in current partial CODE STATUS. Patient's husband agreed that pursuing CPR and other aggressive measures despite not desiring to pursue intubation would cause discomfort and harm to the patient and agreed to a full DO NOT RESUSCITATE status. Later the patient's 2 sons presented to the bedside and this information was presented to them and they both agreed. During that conversation it was opted to continue conservative medical management for an additional 24 hours.  Later in the day on  12/21 palliative medicine also met with the family. They seem to be leaning towards comfort measures but still wish to continue the aforementioned timeframe before discontinuing conservative medical management.  As of 12/22 husband encouraged by patients improved mentation so reluctant to dc anbx's at this juncture so will continue for now.  HPI/Subjective: Patient less responsive on my evaluation today, family reporting she had just received IV morphine.   Assessment/Plan:  Perforated viscus with Peritonitis -Conservative management -Continue Zosyn 3.375 mg IV q 8 hours -Her prognosis is poor overall, palliative care following.  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 Rolm Bookbinder  Procedures: None  Antibiotics: Zosyn 12/20 >  Objective: Blood pressure 161/72, pulse 104, temperature 97.3 F (36.3 C), temperature source Axillary, resp. rate 20, height _0  (Novak.676 m), weight 57.4 kg (126 lb 8.7 oz), SpO2 94 %.  Intake/Output Summary (Last 24 hours) at  08/12/14 1530 Last data filed at 08/12/14 0600  Gross per 24 hour  Intake   1422 ml  Output    250 ml  Net   1172 ml    Exam: Gen: No acute respiratory  distress-much more alert today and communicative Chest: Diffuse rhonchi, negative wheezes, or crackles. Diminished lung sounds due to patient's splinting from abdominal pain, room air Cardiac: Regular rate and rhythm, S1-S2, no rubs murmurs or gallops, no peripheral edema, no JVD Abdomen: Distended and firm and diffusely tender with guarding and rebounding, bowel sounds absent Extremities: Symmetrical in appearance without cyanosis, clubbing or effusion  Scheduled Meds:  Scheduled Meds: . acetylcysteine  4 mL Nebulization Q6H  . antiseptic oral rinse  7 mL Mouth Rinse BID  . dextromethorphan-guaiFENesin  Novak tablet Oral BID  . ipratropium  0.5 mg Nebulization Q6H  . ketorolac  15 mg Intravenous 4 times per day  . levalbuterol  0.63 mg Nebulization Q6H  . metoprolol  5 mg Intravenous Q4H  . piperacillin-tazobactam (ZOSYN)  IV  3.375 g Intravenous 3 times per day   Data Reviewed: Basic Metabolic Panel:  Recent Labs Lab 08/10/14 0002 08/10/14 0030 08/10/14 2220  NA 139 138 140  K 4.6 4.6 4.2  CL 96 94* 103  CO2 35*  --  32  GLUCOSE 138* 147* 109*  BUN 18 24* 21  CREATININE 0.35* 0.50 0.41*  CALCIUM 9.4  --  7.9*   Liver Function Tests:  Recent Labs Lab 08/10/14 0002 08/10/14 2220  AST 19 14  ALT 18 15  ALKPHOS 127* 82  BILITOT 0.4 0.6  PROT 7.Novak 5.Novak*  ALBUMIN 2.5* Novak.8*    Recent Labs Lab 08/10/14 0002  LIPASE 15   CBC:  Recent Labs Lab 08/10/14 0002 08/10/14 0030 08/10/14 2220  WBC 21.2*  --  13.Novak*  NEUTROABS 19.6*  --  11.9*  HGB 14.0 17.3* 11.5*  HCT 45.7 51.0* 39.Novak  MCV 90.3  --  92.2  PLT 290  --  255   BNP (last 3 results)  Recent Labs  01/06/14 1746 01/12/14 0525 05/17/14 1936  PROBNP 1290.0* 528.2* 832.Novak*   CBG:  Recent Labs Lab 08/11/14 0001 08/11/14 0658 08/11/14 2346 08/12/14 0552 08/12/14 1156  GLUCAP 107* 152* 134* 131* 127*    Recent Results (from the past 240 hour(s))  Urine culture     Status: None   Collection Time:  08/10/14 12:21 AM  Result Value Ref Range Status   Specimen Description URINE, CLEAN CATCH  Final   Special Requests NONE  Final   Culture  Setup Time   Final    08/10/2014 09:48 Performed at [facility]   Final    55,000 COLONIES/ML Performed at [facility]    Culture   Final    STAPHYLOCOCCUS SPECIES (COAGULASE NEGATIVE) Note: RIFAMPIN AND GENTAMICIN SHOULD NOT BE USED AS SINGLE DRUGS FOR TREATMENT OF STAPH INFECTIONS. Performed at [facility]    Report Status 08/12/2014 FINAL  Final   Organism ID, Bacteria STAPHYLOCOCCUS SPECIES (COAGULASE NEGATIVE)  Final      Susceptibility   Staphylococcus species (coagulase negative) - MIC*    GENTAMICIN <=0.5 SENSITIVE Sensitive     LEVOFLOXACIN >=8 RESISTANT Resistant     NITROFURANTOIN <=16 SENSITIVE Sensitive     OXACILLIN >=4 RESISTANT Resistant     PENICILLIN >=0.5 RESISTANT Resistant     RIFAMPIN <=0.5 SENSITIVE Sensitive     TRIMETH/SULFA 80 RESISTANT  Resistant     VANCOMYCIN 2 SENSITIVE Sensitive     TETRACYCLINE 2 SENSITIVE Sensitive     * STAPHYLOCOCCUS SPECIES (COAGULASE NEGATIVE)  Blood Culture (routine x 2)     Status: None (Preliminary result)   Collection Time: 08/10/14  2:42 AM  Result Value Ref Range Status   Specimen Description BLOOD RIGHT HAND  Final   Special Requests BOTTLES DRAWN AEROBIC AND ANAEROBIC 5CC EA  Final   Culture  Setup Time   Final    08/10/2014 10:18 Performed at [facility]    Culture   Final           BLOOD CULTURE RECEIVED NO GROWTH TO DATE CULTURE WILL BE HELD FOR 5 DAYS BEFORE ISSUING A FINAL NEGATIVE REPORT Performed at [facility]    Report Status PENDING  Incomplete  Blood Culture (routine x 2)     Status: None (Preliminary result)   Collection Time: 08/10/14  2:50 AM  Result Value Ref Range Status   Specimen Description BLOOD LEFT HAND  Final   Special Requests BOTTLES DRAWN AEROBIC AND ANAEROBIC 5CC EA  Final   Culture   Setup Time   Final    08/10/2014 10:18 Performed at [facility]    Culture   Final           BLOOD CULTURE RECEIVED NO GROWTH TO DATE CULTURE WILL BE HELD FOR 5 DAYS BEFORE ISSUING A FINAL NEGATIVE REPORT Performed at [facility]    Report Status PENDING  Incomplete     Studies:  Recent x-ray studies have been reviewed in detail by the Attending Physician   If 7PM-7AM, please contact night-coverage [email or web address] Password [email or web address] 08/12/2014, 3:30 PM   LOS: 3 days

## 2014-08-12 NOTE — Progress Notes (Signed)
INITIAL NUTRITION ASSESSMENT  DOCUMENTATION CODES Per approved criteria  -Not Applicable   INTERVENTION: -RD to follow for diet advancement and goals of care  NUTRITION DIAGNOSIS: Inadequate oral intake related to altered GI function as evidenced by NPO.   Goal: Pt will meet >90% of estimated nutritional needs  Monitor:  Diet advancement, possible initiation of nutrition support, labs, weight changes, I/O's, goals of care  Reason for Assessment: Low braden  73 y.o. [id number]  Admitting Dx: Peritonitis  73 year old WF PMHx chronic respiratory failure in setting of COPD and recurrent bronchiectasis on 3 L O2 via [place] at home. Was recently admitted for acute on chronic respiratory failure and discharged to skilled nursing facility. She was sent over from the nursing facility because of abdominal pain for 3 days which had worsened.  ASSESSMENT: Pt admitted with perforated viscus with peritonitis.  Pt is not a surgical candidate and prognosis is very poor. Palliative care following. Plan to transition to comfort care in the next 24-48 if pt does not improve. Pt remain on antibiotics. Per MD notes, possible initiation of TPN in the next 48 hours if comfort care is not pursued.  Nutrition-focused physical exam deferred at this time. Labs reviewed. Creat: 0.41, Calcium: 7.9, Glucose: 109. CBGS: 127-134.   Height: Ht Readings from Last 1 Encounters:  08/10/14 5\' 6"  (1.676 m)    Weight: Wt Readings from Last 1 Encounters:  08/10/14 126 lb 8.7 oz (57.4 kg)    Ideal Body Weight: 130#  % Ideal Body Weight: 97%  Wt Readings from Last 25 Encounters:  08/10/14 126 lb 8.7 oz (57.4 kg)  08/07/14 128 lb 6.4 oz (58.242 kg)  08/01/14 115 lb 8.3 oz (52.4 kg)  06/04/14 118 lb 6.4 oz (53.706 kg)  05/27/14 119 lb 12 oz (54.318 kg)  05/17/14 116 lb 3.2 oz (52.708 kg)  05/04/14 118 lb 8 oz (53.751 kg)  03/20/14 112 lb (50.803 kg)  03/16/14 119 lb (53.978 kg)  02/04/14 116 lb 12.8 oz (52.98  kg)  01/21/14 115 lb 12.8 oz (52.527 kg)  01/15/14 111 lb 14.4 oz (50.758 kg)  01/02/14 116 lb 9.6 oz (52.889 kg)  10/17/13 119 lb 6.4 oz (54.159 kg)  08/06/13 122 lb 12.8 oz (55.702 kg)  06/25/13 121 lb 12.8 oz (55.248 kg)  06/24/13 121 lb (54.885 kg)  04/16/13 121 lb (54.885 kg)  02/28/13 121 lb 3.2 oz (54.976 kg)  02/10/13 121 lb 6.4 oz (55.067 kg)  12/17/12 119 lb 8 oz (54.205 kg)  12/11/12 122 lb (55.339 kg)  11/29/12 122 lb 3.2 oz (55.43 kg)  11/13/12 121 lb 6.4 oz (55.067 kg)  10/29/12 123 lb (55.792 kg)   Usual Body Weight: 122#  % Usual Body Weight: 103%  BMI:  Body mass index is 20.43 kg/(m^2). Normal weight range  Estimated Nutritional Needs: Kcal: 1500-1700 Protein: 68-78 grams Fluid: 1.5-1.7 L  Skin: ecchymosis  Diet Order: Diet NPO time specified Except for: Ice Chips  EDUCATION NEEDS: -Education not appropriate at this time   Intake/Output Summary (Last 24 hours) at 08/12/14 1236 Last data filed at 08/12/14 0600  Gross per 24 hour  Intake   1422 ml  Output    250 ml  Net   1172 ml    Last BM: 08/07/14  Labs:   Recent Labs Lab 08/10/14 0002 08/10/14 0030 08/10/14 2220  NA 139 138 140  K 4.6 4.6 4.2  CL 96 94* 103  CO2 35*  --  32  BUN  18 24* 21  CREATININE 0.35* 0.50 0.41*  CALCIUM 9.4  --  7.9*  GLUCOSE 138* 147* 109*    CBG (last 3)   Recent Labs  08/11/14 2346 08/12/14 0552 08/12/14 1156  GLUCAP 134* 131* 127*    Scheduled Meds: . acetylcysteine  4 mL Nebulization Q6H  . antiseptic oral rinse  7 mL Mouth Rinse BID  . dextromethorphan-guaiFENesin  1 tablet Oral BID  . ipratropium  0.5 mg Nebulization Q6H  . ketorolac  15 mg Intravenous 4 times per day  . levalbuterol  0.63 mg Nebulization Q6H  . metoprolol  5 mg Intravenous Q4H  . piperacillin-tazobactam (ZOSYN)  IV  3.375 g Intravenous 3 times per day    Continuous Infusions: . dextrose 5 % and 0.9% NaCl 125 mL/hr at 08/12/14 1230    Past Medical History   Diagnosis Date  . 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 COPD (chronic obstructive pulmonary disease)   . Bronchiectasis   . Hypercalcemia   . GERD (gastroesophageal reflux disease)   . Hypertension   . Asthma     hx of PNA  . Adult failure to thrive 08/06/2012    20 pound weight loss one year, anorexia, pernicious anemia   . 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 CXR 01/23/12- again shows essentially stable severe fibro--cystic scarring with bronchiectasis and air fluid levels, but no obvious progression. Desat to 86% with exertion walking room air- order portable O2   . GERD   . Pernicious anemia     Past Surgical History  Procedure Laterality Date  . Tonsillectomy    . Lung biopsy      Elizah Lydon A. Mayford KnifeWilliams, RD, LDN Pager: [phone] After hours Pager: [phone]

## 2014-08-13 ENCOUNTER — Ambulatory Visit: Payer: Medicare Other | Admitting: Adult Health

## 2014-08-13 LAB — GLUCOSE, CAPILLARY
GLUCOSE-CAPILLARY: 161 mg/dL — AB (ref 70–99)
Glucose-Capillary: 147 mg/dL — ABNORMAL HIGH (ref 70–99)
Glucose-Capillary: 95 mg/dL (ref 70–99)

## 2014-08-13 MED ORDER — MORPHINE BOLUS VIA INFUSION
2.0000 mg | INTRAVENOUS | Status: DC | PRN
  Filled 2014-08-13: qty 2

## 2014-08-13 MED ORDER — LORAZEPAM 2 MG/ML IJ SOLN: 1.0000 mg | INTRAMUSCULAR | Status: DC | PRN

## 2014-08-13 MED ORDER — LORAZEPAM 2 MG/ML IJ SOLN
INTRAMUSCULAR | Status: AC
  Filled 2014-08-13: qty 1

## 2014-08-13 MED ORDER — ATROPINE SULFATE 1 % OP SOLN: 4.0000 [drp] | OPHTHALMIC | Status: DC | PRN

## 2014-08-13 MED ORDER — SODIUM CHLORIDE 0.9 % IV SOLN
2.0000 mg/h | INTRAVENOUS | Status: DC
  Administered 2014-08-13: 2 mg/h via INTRAVENOUS
  Filled 2014-08-13: qty 10

## 2014-08-13 MED ORDER — LORAZEPAM 2 MG/ML IJ SOLN
1.0000 mg | Freq: Once | INTRAMUSCULAR | Status: AC
  Administered 2014-08-13: 1 mg via INTRAVENOUS

## 2014-08-13 MED ORDER — SODIUM CHLORIDE 0.9 % IV SOLN
INTRAVENOUS | Status: DC
  Administered 2014-08-13: 10:00:00 via INTRAVENOUS

## 2014-08-13 NOTE — Progress Notes (Signed)
ANTIBIOTIC CONSULT NOTE - FOLLOW UP  Pharmacy Consult for Zosyn Indication: Perforated viscus with peritonitis  Allergies  Allergen Reactions  . Albuterol Anaphylaxis    Makes heart race  . Daliresp [Roflumilast] Other (See Comments)    Mental changes, feeling weird  . Doxycycline Other (See Comments)    Bad taste in mouth, "salty coating"  . Levaquin [Levofloxacin] Palpitations and Other (See Comments)    Joint pains/aches  . Other Other (See Comments)    DAIRY PRODUCTS-causes thick mucous secretions  . Sulfonamide Derivatives Nausea Only    Patient Measurements: Height: 5\' 6"  (167.6 cm) Weight: 126 lb 8.7 oz (57.4 kg) IBW/kg (Calculated) : 59.3 Adjusted Body Weight:   Vital Signs: Temp: 97.7 F (36.5 C) (12/24 0546) Temp Source: Axillary (12/24 0546) BP: 159/87 mmHg (12/24 0546) Pulse Rate: 120 (12/24 0546) Intake/Output from previous day: 12/23 0701 - 12/24 0700 In: 0  Out: 500 [Urine:500] Intake/Output from this shift:    Labs:  Recent Labs  08/10/14 2220  WBC 13.1*  HGB 11.5*  PLT 255  CREATININE 0.41*   Estimated Creatinine Clearance: 55.1 mL/min (by C-G formula based on Cr of 0.41). No results for input(s): VANCOTROUGH, VANCOPEAK, VANCORANDOM, GENTTROUGH, GENTPEAK, GENTRANDOM, TOBRATROUGH, TOBRAPEAK, TOBRARND, AMIKACINPEAK, AMIKACINTROU, AMIKACIN in the last 72 hours.   Microbiology: Recent Results (from the past 720 hour(s))  MRSA PCR Screening     Status: None   Collection Time: 08/01/14  8:00 PM  Result Value Ref Range Status   MRSA by PCR NEGATIVE NEGATIVE Final    Comment:        The GeneXpert MRSA Assay (FDA approved for NASAL specimens only), is one component of a comprehensive MRSA colonization surveillance program. It is not intended to diagnose MRSA infection nor to guide or monitor treatment for MRSA infections.   Culture, blood (routine x 2)     Status: None   Collection Time: 08/01/14  9:45 PM  Result Value Ref Range Status    Specimen Description BLOOD RIGHT ANTECUBITAL  Final   Special Requests BOTTLES DRAWN AEROBIC AND ANAEROBIC 10CC EA  Final   Culture  Setup Time   Final    08/02/2014 04:08 Performed at [facility]    Culture   Final    NO GROWTH 5 DAYS Performed at [facility]    Report Status 08/08/2014 FINAL  Final  Culture, blood (routine x 2)     Status: None   Collection Time: 08/01/14 10:00 PM  Result Value Ref Range Status   Specimen Description BLOOD LEFT ANTECUBITAL  Final   Special Requests BOTTLES DRAWN AEROBIC AND ANAEROBIC 10CC EA  Final   Culture  Setup Time   Final    08/02/2014 04:08 Performed at [facility]    Culture   Final    NO GROWTH 5 DAYS Performed at [facility]    Report Status 08/08/2014 FINAL  Final  Urine culture     Status: None   Collection Time: 08/10/14 12:21 AM  Result Value Ref Range Status   Specimen Description URINE, CLEAN CATCH  Final   Special Requests NONE  Final   Culture  Setup Time   Final    08/10/2014 09:48 Performed at [facility]    Colony Count   Final    55,000 COLONIES/ML Performed at [facility]    Culture   Final    STAPHYLOCOCCUS SPECIES (COAGULASE NEGATIVE) Note: RIFAMPIN AND GENTAMICIN SHOULD NOT BE USED AS  SINGLE DRUGS FOR TREATMENT OF STAPH INFECTIONS. Performed at [facility]    Report Status 08/12/2014 FINAL  Final   Organism ID, Bacteria STAPHYLOCOCCUS SPECIES (COAGULASE NEGATIVE)  Final      Susceptibility   Staphylococcus species (coagulase negative) - MIC*    GENTAMICIN <=0.5 SENSITIVE Sensitive     LEVOFLOXACIN >=8 RESISTANT Resistant     NITROFURANTOIN <=16 SENSITIVE Sensitive     OXACILLIN >=4 RESISTANT Resistant     PENICILLIN >=0.5 RESISTANT Resistant     RIFAMPIN <=0.5 SENSITIVE Sensitive     TRIMETH/SULFA 80 RESISTANT Resistant     VANCOMYCIN 2 SENSITIVE Sensitive     TETRACYCLINE 2 SENSITIVE Sensitive     * STAPHYLOCOCCUS SPECIES (COAGULASE  NEGATIVE)  Blood Culture (routine x 2)     Status: None (Preliminary result)   Collection Time: 08/10/14  2:42 AM  Result Value Ref Range Status   Specimen Description BLOOD RIGHT HAND  Final   Special Requests BOTTLES DRAWN AEROBIC AND ANAEROBIC 5CC EA  Final   Culture  Setup Time   Final    08/10/2014 10:18 Performed at [facility]    Culture   Final           BLOOD CULTURE RECEIVED NO GROWTH TO DATE CULTURE WILL BE HELD FOR 5 DAYS BEFORE ISSUING A FINAL NEGATIVE REPORT Performed at [facility]    Report Status PENDING  Incomplete  Blood Culture (routine x 2)     Status: None (Preliminary result)   Collection Time: 08/10/14  2:50 AM  Result Value Ref Range Status   Specimen Description BLOOD LEFT HAND  Final   Special Requests BOTTLES DRAWN AEROBIC AND ANAEROBIC 5CC EA  Final   Culture  Setup Time   Final    08/10/2014 10:18 Performed at [facility]    Culture   Final           BLOOD CULTURE RECEIVED NO GROWTH TO DATE CULTURE WILL BE HELD FOR 5 DAYS BEFORE ISSUING A FINAL NEGATIVE REPORT Performed at [facility]    Report Status PENDING  Incomplete    Anti-infectives    Start     Dose/Rate Route Frequency Ordered Stop   08/10/14 0800  piperacillin-tazobactam (ZOSYN) IVPB 3.375 g     3.375 g12.5 mL/hr over 240 Minutes Intravenous 3 times per day 08/10/14 0641     08/10/14 0100  piperacillin-tazobactam (ZOSYN) IVPB 3.375 g     3.375 g100 mL/hr over 30 Minutes Intravenous  Once 08/10/14 0058 08/10/14 0247      Assessment: 73yo female with COPD/bronchiectasis, on Zosyn day#4 for peritonitis with perforated viscus.  Blood cx are NTD.  UCx was (+)Coag Neg Staph with UA(-) on same day; expect this is colonization/contamination.  There are no plans for the OR as she would likely not survive.  Pt is DNR with expectation of developing sepsis.  AFeb.  Per Cr of 12/21, Zosyn dose is appropriate.  Pharmacy will sign-off; please reconsult as  needed.  Marisue HumbleKendra Seretha Estabrooks, PharmD Clinical Pharmacist [facility]

## 2014-08-13 NOTE — Progress Notes (Signed)
[name]  Stacy Novak DVV:[id number] DOB: 06-18-1941 DOA: 08/18/2014 PCP: Gwendolyn Grant, MD  Brief narrative: 73 year old WF PMHx chronic respiratory failure in setting of COPD and recurrent bronchiectasis on 3 L O2 via [place] at home. Was recently admitted for acute on chronic respiratory failure and discharged to skilled nursing facility. She was sent over from the nursing facility because of abdominal pain for 3 days which had worsened.  In the ER abdominal films revealed gas under the diaphragm concerning for perforation. Surgery was consulted and after discussing with the patient and her family the patient decided not proceed with surgical evaluation. It was explained to the patient and her family that without surgery she had a high mortality rate and patient and husband verbalized understanding. In the ER her white count was 21,200 and her renal function was normal. She was diffusely tender on exam.  Since admission an extensive discussion was held with the patient. At admission patient was a partial code with our only limitation at that time being DO NOT INTUBATE. Discussed patient's poor prognosis and explained pathophysiology involved with untreated perforated bowel and likely progressing to life-threatening sepsis. Given the fact we were not going to treat these conditions it would not be in the patient's best interests to pursue aggressive resuscitation as outlined in current partial CODE STATUS. Patient's husband agreed that pursuing CPR and other aggressive measures despite not desiring to pursue intubation would cause discomfort and harm to the patient and agreed to a full DO NOT RESUSCITATE status. Later the patient's 2 sons presented to the bedside and this information was presented to them and they both agreed. During that conversation it was opted to continue conservative medical management for an additional 24 hours.  Later in the day on  12/21 palliative medicine also met with the family. They seem to be leaning towards comfort measures but still wish to continue the aforementioned timeframe before discontinuing conservative medical management.  As of 12/22 husband encouraged by patients improved mentation so reluctant to dc anbx's at this juncture so will continue for now.  HPI/Subjective: Patient less responsive on my evaluation today, family reporting she had just received IV morphine.   Assessment/Plan:  Perforated viscus with Peritonitis -Non-surgical management -Patient having a further decline overnight  -Palliative care following -Transitioned to full comfort.   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 Rolm Bookbinder  Procedures: None  Antibiotics: Zosyn 12/20 >  Objective: Blood pressure 105/50, pulse 105, temperature 98.5 F (36.9 C), temperature source Axillary, resp. rate 20, height $RemoveBe'5\' 6"'sDgBlRazg$  (1.676 m), weight 57.4 kg (126 lb 8.7 oz), SpO2 91 %.  Intake/Output Summary (Last 24 hours) at 08/13/14 1522 Last data filed at 08/13/14 0600  Gross per 24 hour  Intake   3150 ml  Output    500 ml  Net   2650 ml    Exam: Gen: Patient is unresponsive Chest: Diffuse rhonchi, negative wheezes, or crackles. Diminished lung sounds due to patient's splinting from abdominal pain, room air  Cardiac: Regular rate and rhythm, S1-S2, no rubs murmurs or gallops, no peripheral edema, no  JVD Abdomen: Distended and firm  Extremities: Symmetrical in appearance without cyanosis, clubbing or effusion  Scheduled Meds:  Scheduled Meds: . antiseptic oral rinse  7 mL Mouth Rinse BID  . ipratropium  0.5 mg Nebulization Q6H  . ketorolac  15 mg Intravenous 4 times per day  . levalbuterol  0.63 mg Nebulization Q6H   Data Reviewed: Basic Metabolic Panel:  Recent Labs Lab 08/10/14 0002 08/10/14 0030 08/10/14 2220  NA 139 138 140  K 4.6 4.6 4.2  CL 96 94* 103  CO2 35*  --  32  GLUCOSE 138* 147* 109*  BUN 18 24* 21  CREATININE 0.35* 0.50 0.41*  CALCIUM 9.4  --  7.9*   Liver Function Tests:  Recent Labs Lab 08/10/14 0002 08/10/14 2220  AST 19 14  ALT 18 15  ALKPHOS 127* 82  BILITOT 0.4 0.6  PROT 7.1 5.1*  ALBUMIN 2.5* 1.8*    Recent Labs Lab 08/10/14 0002  LIPASE 15   CBC:  Recent Labs Lab 08/10/14 0002 08/10/14 0030 08/10/14 2220  WBC 21.2*  --  13.1*  NEUTROABS 19.6*  --  11.9*  HGB 14.0 17.3* 11.5*  HCT 45.7 51.0* 39.1  MCV 90.3  --  92.2  PLT 290  --  255   BNP (last 3 results)  Recent Labs  01/06/14 1746 01/12/14 0525 05/17/14 1936  PROBNP 1290.0* 528.2* 832.1*   CBG:  Recent Labs Lab 08/12/14 1156 08/12/14 1757 08/12/14 2348 08/13/14 0556 08/13/14 1222  GLUCAP 127* 131* 147* 161* 95    Recent Results (from the past 240 hour(s))  Urine culture     Status: None   Collection Time: 08/10/14 12:21 AM  Result Value Ref Range Status   Specimen Description URINE, CLEAN CATCH  Final   Special Requests NONE  Final   Culture  Setup Time   Final    08/10/2014 09:48 Performed at [facility]   Final    55,000 COLONIES/ML Performed at [facility]    Culture   Final    STAPHYLOCOCCUS SPECIES (COAGULASE NEGATIVE) Note: RIFAMPIN AND GENTAMICIN SHOULD NOT BE USED AS SINGLE DRUGS FOR TREATMENT OF STAPH INFECTIONS. Performed at [facility]    Report Status 08/12/2014 FINAL  Final    Organism ID, Bacteria STAPHYLOCOCCUS SPECIES (COAGULASE NEGATIVE)  Final      Susceptibility   Staphylococcus species (coagulase negative) - MIC*    GENTAMICIN <=0.5 SENSITIVE Sensitive     LEVOFLOXACIN >=8 RESISTANT Resistant     NITROFURANTOIN <=16 SENSITIVE Sensitive     OXACILLIN >=4 RESISTANT Resistant     PENICILLIN >=0.5 RESISTANT Resistant     RIFAMPIN <=0.5 SENSITIVE Sensitive     TRIMETH/SULFA 80 RESISTANT Resistant     VANCOMYCIN 2 SENSITIVE Sensitive     TETRACYCLINE 2 SENSITIVE Sensitive     * STAPHYLOCOCCUS SPECIES (COAGULASE NEGATIVE)  Blood Culture (routine x 2)     Status: None (Preliminary result)   Collection Time: 08/10/14  2:42 AM  Result Value Ref Range Status   Specimen Description BLOOD RIGHT HAND  Final   Special Requests BOTTLES DRAWN AEROBIC AND ANAEROBIC 5CC EA  Final   Culture  Setup Time   Final    08/10/2014 10:18 Performed at [facility]   Final  BLOOD CULTURE RECEIVED NO GROWTH TO DATE CULTURE WILL BE HELD FOR 5 DAYS BEFORE ISSUING A FINAL NEGATIVE REPORT Performed at [facility]    Report Status PENDING  Incomplete  Blood Culture (routine x 2)     Status: None (Preliminary result)   Collection Time: 08/10/14  2:50 AM  Result Value Ref Range Status   Specimen Description BLOOD LEFT HAND  Final   Special Requests BOTTLES DRAWN AEROBIC AND ANAEROBIC 5CC EA  Final   Culture  Setup Time   Final    08/10/2014 10:18 Performed at [facility]    Culture   Final           BLOOD CULTURE RECEIVED NO GROWTH TO DATE CULTURE WILL BE HELD FOR 5 DAYS BEFORE ISSUING A FINAL NEGATIVE REPORT Performed at [facility]    Report Status PENDING  Incomplete     Studies:  Recent x-ray studies have been reviewed in detail by the Attending Physician   If 7PM-7AM, please contact night-coverage [email or web address] Password [email or web address] 08/13/2014, 3:22 PM   LOS: 4 days

## 2014-08-13 NOTE — Progress Notes (Signed)
   08/13/14 1441  Clinical Encounter Type  Visited With Family  Visit Type Follow-up;Spiritual support  Chaplain followed up with pt's family per request of Lestine BoxChaplain Bob. Family says they are doing all right. Pt's son Jonny RuizJohn has left for a little bit to be with his family but may return. I let them know that I will be here all night as I am on call, and they can feel free to ask for me if need arises.   Wille GlaserMcCray, Shironda Kain O, Chaplain 08/13/2014 2:42 PM

## 2014-08-13 NOTE — Progress Notes (Signed)
Progress Note [facility] Pall[facility] Medicine Team [facility]  Subjective: Stacy Novak has declined over night and is lying in bed moaning in pain - very restless. Her two sons and husband are at her bedside. Her oxygen levels have dropped, her mental status has declined, and her color is worse. I had a discussion with family and they agree to full comfort measures. I explained that prognosis is poor at likely hours to days. I will order morphine infusion with their permission.    Objective: Allergies  Allergen Reactions  . Albuterol Anaphylaxis    Makes heart race  . Daliresp [Roflumilast] Other (See Comments)    Mental changes, feeling weird  . Doxycycline Other (See Comments)    Bad taste in mouth, "salty coating"  . Levaquin [Levofloxacin] Palpitations and Other (See Comments)    Joint pains/aches  . Other Other (See Comments)    DAIRY PRODUCTS-causes thick mucous secretions  . Sulfonamide Derivatives Nausea Only   Scheduled Meds: . acetylcysteine  4 mL Nebulization Q6H  . antiseptic oral rinse  7 mL Mouth Rinse BID  . dextromethorphan-guaiFENesin  1 tablet Oral BID  . ipratropium  0.5 mg Nebulization Q6H  . ketorolac  15 mg Intravenous 4 times per day  . levalbuterol  0.63 mg Nebulization Q6H  . metoprolol  5 mg Intravenous Q4H  . piperacillin-tazobactam (ZOSYN)  IV  3.375 g Intravenous 3 times per day   Continuous Infusions: . dextrose 5 % and 0.9% NaCl 125 mL/hr (08/13/14 0457)   PRN Meds:.acetaminophen **OR** acetaminophen, hydrALAZINE, levalbuterol, morphine injection, ondansetron **OR** ondansetron (ZOFRAN) IV  BP 159/87 mmHg  Pulse 120  Temp(Src) 97.7 F (36.5 C) (Axillary)  Resp 24  Ht 5\' 6"  (1.676 m)  Wt 57.4 kg (126 lb 8.7 oz)  BMI 20.43 kg/m2  SpO2 82%   PPS: 20% at best   Intake/Output Summary (Last 24 hours) at 08/13/14 0825 Last data filed at 08/13/14 0600  Gross per 24 hour  Intake      0 ml  Output    500 ml  Net   -500 ml      Physical  Exam:  General: Distressed, thin, frail HEENT: [place]/AT, no JVD, moist mucous membranes Chest: Labored breathing, tachypneic CVS: Tachycardic Abdomen: Diffuse pain to soft touch Ext: No edema, cool to touch Neuro: Obtunded but moaning "please" repeatedly  Labs: CBC    Component Value Date/Time   WBC 13.1* 08/10/2014 2220   RBC 4.24 08/10/2014 2220   HGB 11.5* 08/10/2014 2220   HCT 39.1 08/10/2014 2220   PLT 255 08/10/2014 2220   MCV 92.2 08/10/2014 2220   MCH 27.1 08/10/2014 2220   MCHC 29.4* 08/10/2014 2220   RDW 13.9 08/10/2014 2220   LYMPHSABS 0.6* 08/10/2014 2220   MONOABS 0.6 08/10/2014 2220   EOSABS 0.1 08/10/2014 2220   BASOSABS 0.0 08/10/2014 2220    BMET    Component Value Date/Time   NA 140 08/10/2014 2220   K 4.2 08/10/2014 2220   CL 103 08/10/2014 2220   CO2 32 08/10/2014 2220   GLUCOSE 109* 08/10/2014 2220   BUN 21 08/10/2014 2220   CREATININE 0.41* 08/10/2014 2220   CALCIUM 7.9* 08/10/2014 2220   GFRNONAA >90 08/10/2014 2220   GFRAA >90 08/10/2014 2220    CMP     Component Value Date/Time   NA 140 08/10/2014 2220   K 4.2 08/10/2014 2220   CL 103 08/10/2014 2220   CO2 32 08/10/2014 2220  GLUCOSE 109* 08/10/2014 2220   BUN 21 08/10/2014 2220   CREATININE 0.41* 08/10/2014 2220   CALCIUM 7.9* 08/10/2014 2220   PROT 5.1* 08/10/2014 2220   ALBUMIN 1.8* 08/10/2014 2220   AST 14 08/10/2014 2220   ALT 15 08/10/2014 2220   ALKPHOS 82 08/10/2014 2220   BILITOT 0.6 08/10/2014 2220   GFRNONAA >90 08/10/2014 2220   GFRAA >90 08/10/2014 2220    Assessment and Plan: 1. Code Status: DNR 2. Symptom Control: 1. Abdominal pain: Morphine infusion at 2 mg/hr and titrate to max of 10 mg/hr for comfort. Bolus 2 mg every 15 min prn.  2. Agitation: Lorazepam 1 mg every 4 hours prn.  3. Secretions: Atropine SL every 4 hours prn.  3. Psycho/Social: Emotional support provided to patient and family during a difficult time.  4. Spiritual: Chaplain consulted as  family has expressed their belief as a christian and that Stacy Novak will go to heaven.  5. Disposition: Anticipate hospital death.    Time In Time Out Total Time Spent with Patient Total Overall Time  0800 0830 20min 30min    Greater than 50%  of this time was spent counseling and coordinating care related to the above assessment and plan.  Yong ChannelAlicia Mattie Nordell, NP Palliative Medicine Team Pager # [phone] (M-F 8a-5p) Team Phone # [phone] (Nights/Weekends)   1

## 2014-08-13 NOTE — Progress Notes (Signed)
Chaplain Note:  Received referral per family request and from Vinie Sill NP. I met with husband, [name], and both sons, [name] and [name].  Pt. Was sedated which family felt was best as she had been having difficulty breathing during the night.   Family talked about her decision to not have surgery and her understanding that the bowel perforation will lead to her death.  They spoke about how much she loved Christmas and did not want to die on Christmas for their sake. Her son [name] indicated that he understood her feeling, but felt it would also be a nice time as this was her special day.   Apparently the primary daily care has been provided by her husband and son [name]. They seemed quite unified and supportive of each other and of her.  There are apparently three grandchildren who visited last night - all are John's children.   They were receptive to support and asked me to pray for her, noting that she was a [place] and prayer was important for her. They did not refer to a specific church connection. Their desire was for her to be at peace and know that God will take care of her and that they will be all right, which was the focus of the prayer.   They were appreciative of the care on [facility] and of Elmo Putt Parker's help.    Wells Guiles  [phone] Director Spiritual Care Services.

## 2014-08-16 LAB — CULTURE, BLOOD (ROUTINE X 2)
Culture: NO GROWTH
Culture: NO GROWTH

## 2014-08-21 NOTE — Progress Notes (Signed)
Wasted morphine IV bag 210mL with RN Fleet ContrasRachel in sink.

## 2014-08-21 NOTE — Progress Notes (Signed)
Notified S. Camila Lisman, [facility] regarding the death certificate of expired patient to be signed and MD said that she will go to [facility] as soon as done with the patients she were attending at the moment.

## 2014-08-21 NOTE — Progress Notes (Signed)
Notified on call [facility] MD and left phone message to Palliative Office.  Notified [employer] [facility] [facility] and not a potential donor.  Expired body in morgue and notified Patient admission as well.

## 2014-08-21 DEATH — deceased

## 2014-08-25 ENCOUNTER — Ambulatory Visit: Payer: Medicare Other | Admitting: Adult Health

## 2014-08-27 NOTE — Discharge Summary (Signed)
Death Summary  [name] IRJ:[id number] DOB: May 15, 1941 DOA: 2014/08/16  PCP: Gwendolyn Grant, MD PCP/Office notified:   Admit date: 16-Aug-2014 Date of Death: 09/03/14  Final Diagnoses:  Principal Problem:   Peritonitis Active Problems:   HTN (hypertension)   Adult failure to thrive   Chronic respiratory failure   Palliative care encounter   Abdominal pain   Perforated viscus   Acute on chronic respiratory failure with hypoxia   MAI (mycobacterium avium-intracellulare) infection     History of present illness:  Stacy Novak is a 74 y.o. female with history of chronic respiratory failure secondary to COPD and bronchiectasis was recently admitted for acute on chronic respiratory failure presents to the ER from [facility] because of abdominal pain. Patient states she has been having abdominal pain over the last 3 days which has progressively gotten worsen. In the ER x-rays reveal gas under the diaphragm concerning for perforation. On-call surgeon Dr. Donne Hazel was consulted and at this time patient is not keen on getting any surgical procedure. Patient was explained that without surgery patient has high mortality. Patient has chronic respiratory failure and is mildly short of breath. On exam patient has diffuse abdominal tenderness. Denies any vomiting to as nausea.   Hospital Course:  In the ER abdominal films revealed gas under the diaphragm concerning for perforation. Surgery was consulted and after discussing with the patient and her family the patient decided not proceed with surgical evaluation. It was explained to the patient and her family that without surgery she had a high mortality rate and patient and husband verbalized understanding. In the ER her white count was 21,200 and her renal function was normal. She was diffusely tender on exam.  Since admission an extensive discussion was held with the patient. At admission patient was a partial code with our only limitation  at that time being DO NOT INTUBATE. Discussed patient's poor prognosis and explained pathophysiology involved with untreated perforated bowel and likely progressing to life-threatening sepsis. Given the fact we were not going to treat these conditions it would not be in the patient's best interests to pursue aggressive resuscitation as outlined in current partial CODE STATUS. Patient's husband agreed that pursuing CPR and other aggressive measures despite not desiring to pursue intubation would cause discomfort and harm to the patient and agreed to a full DO NOT RESUSCITATE status. Later the patient's 2 sons presented to the bedside and this information was presented to them and they both agreed. During that conversation it was opted to continue conservative medical management for an additional 24 hours.  On 12/21 palliative medicine also met with the family. They seem to be leaning towards comfort measures but still wish to continue the aforementioned timeframe before discontinuing conservative medical management.On 08/13/2014 patient having a significant decline, becoming minimally responsive. Family expressing wishes to transition to full comfort measures. IV antimicrobials were discontinued. Palliative care followed. I did not evaluate patient a t the time she expired.      SignedKelvin Cellar  [facility] September 03, 2014, 5:55 PM

## 2014-11-03 ENCOUNTER — Ambulatory Visit: Payer: Medicare Other | Admitting: Internal Medicine

## 2014-11-12 NOTE — Progress Notes (Signed)
b12 -per protocol of CMA This entered for encounter closure

## 2015-01-05 IMAGING — DX DG RIBS W/ CHEST 3+V*R*
5 series · 5 of 5 positions shown · non-contrast
Comparison: 09/16/2013

CLINICAL DATA: Severe shortness of breath with constant cough.
Unable to do some of the positions for imaging. History of COPD.

EXAM:
RIGHT RIBS AND CHEST - 3+ VIEW

[chest pa (1 of 3)]
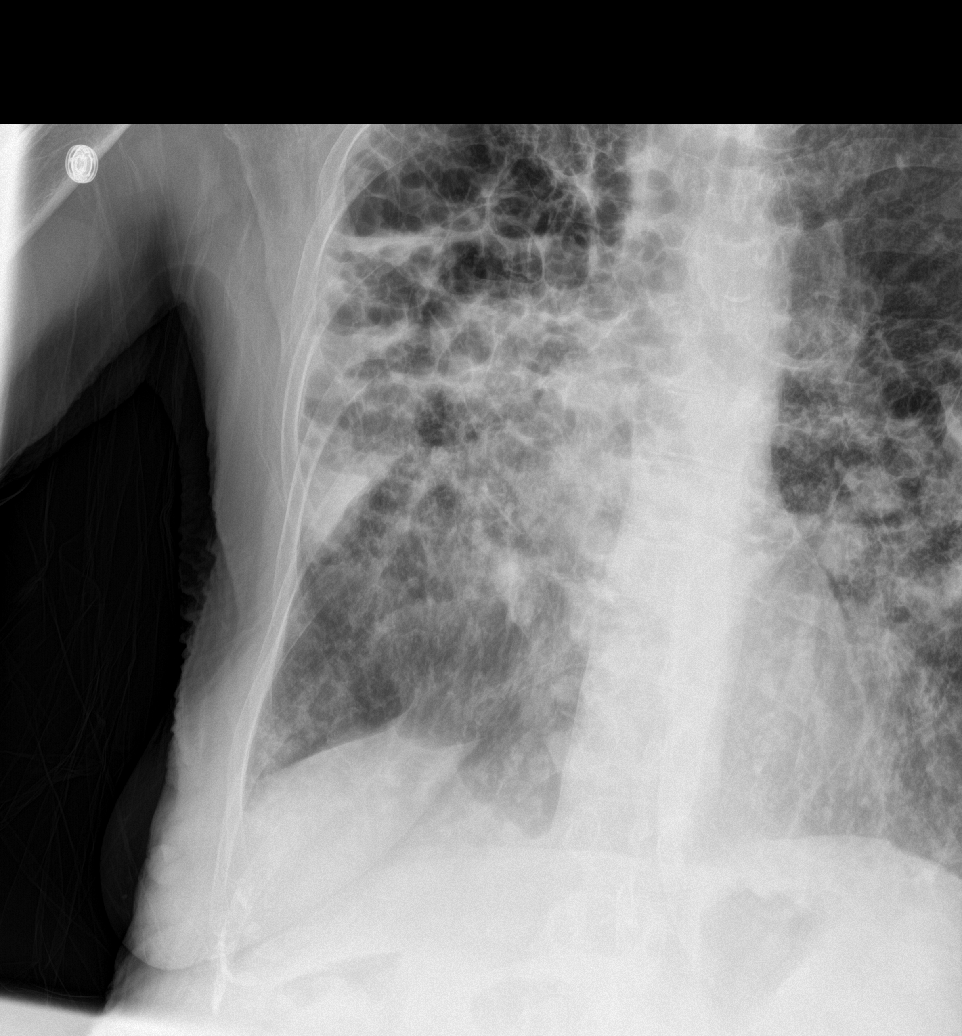

[rib ap]
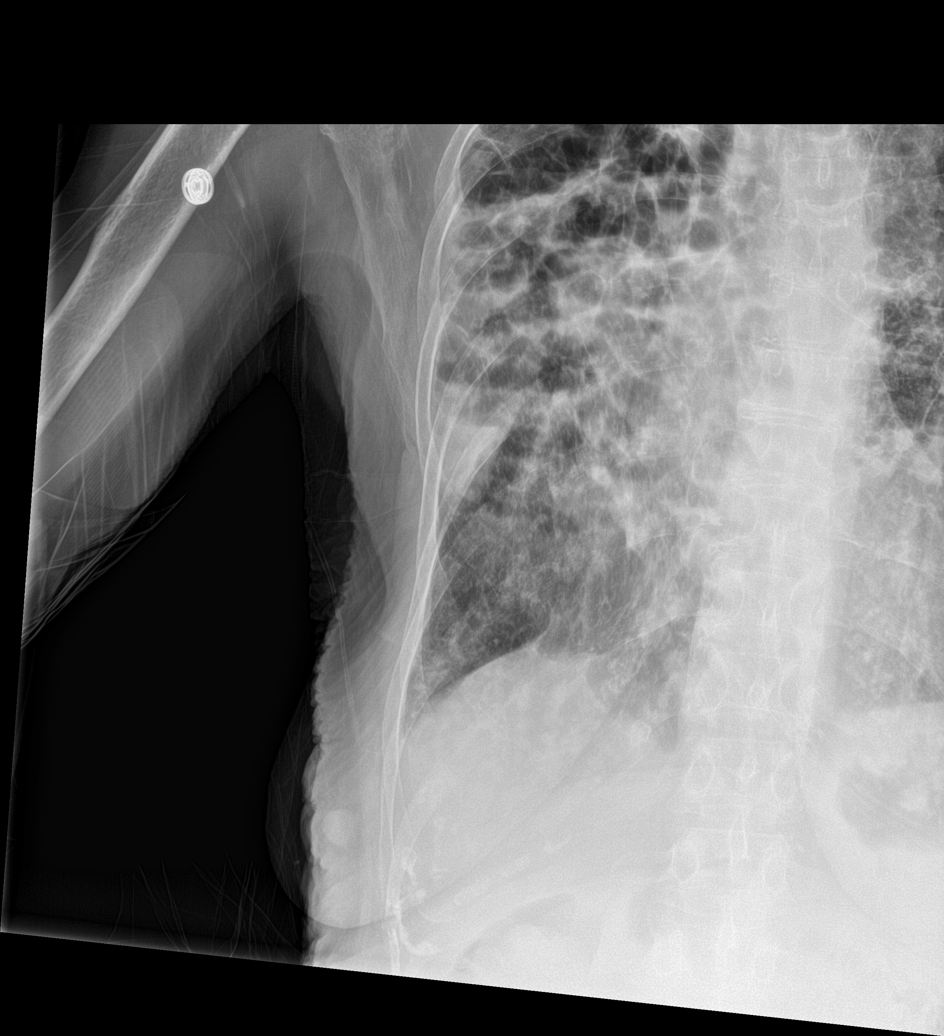

[rib ap obl]
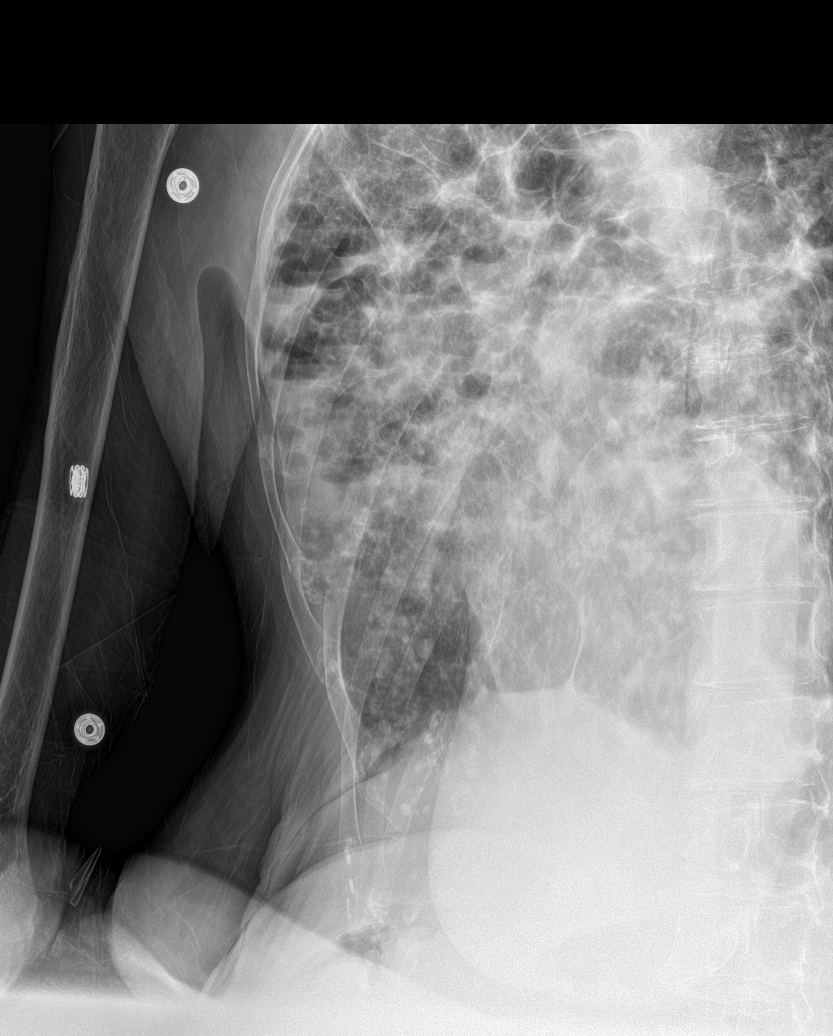

[chest pa (2 of 3)]
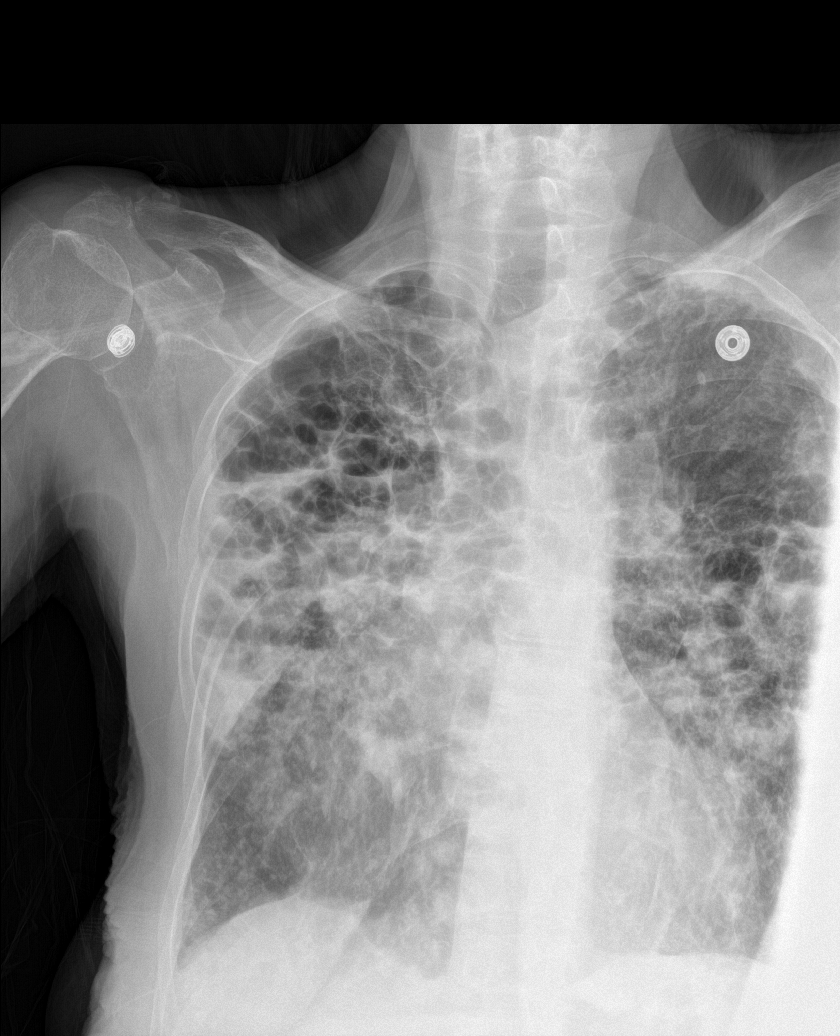

[chest pa (3 of 3)]
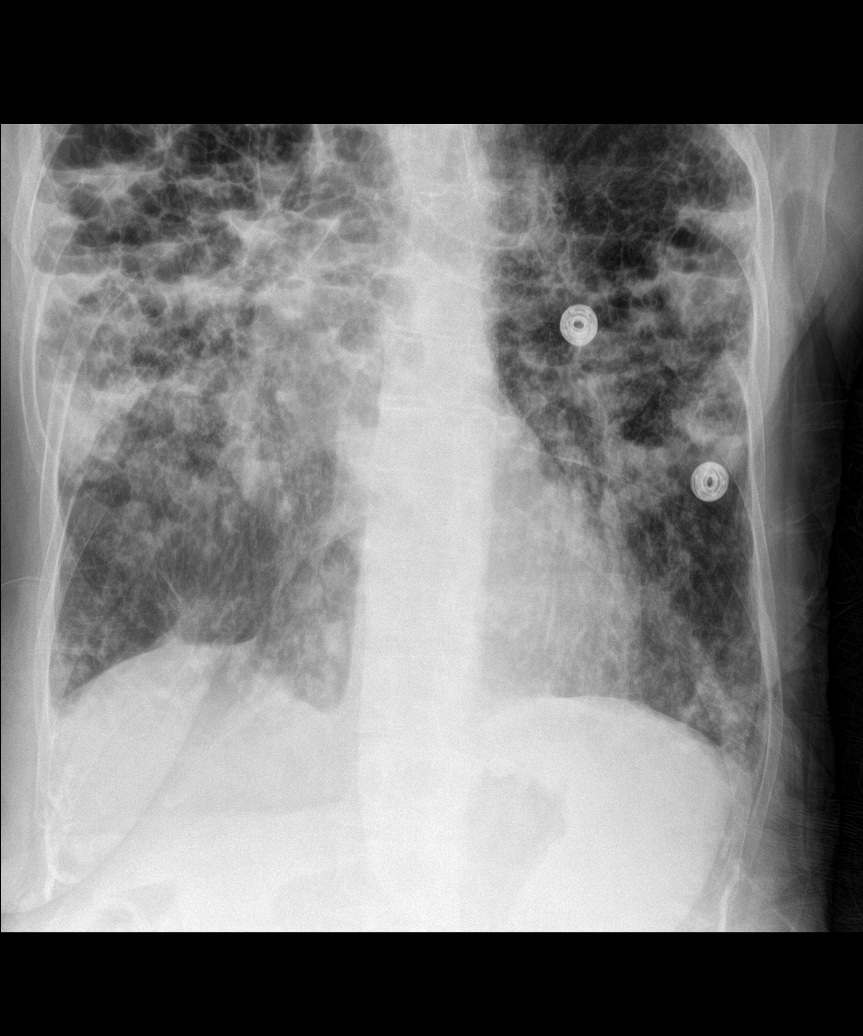

[5 of 5 positions shown; findings below may reference images not displayed]

FINDINGS: Positioning is nonstandard. Multiple oblique views demonstrate no
acute rib fractures. Numerous cystic cavities are identified
throughout the lungs, unchanged since prior study. Small air-fluid
levels are identified in multiple spaces. The heart size is probably
normal. No definite pleural effusions.
IMPRESSION: 1. No evidence for acute fracture.
2. Numerous cystic spaces within the lungs, stable in appearance.
